# Patient Record
Sex: Female | Born: 1945 | Race: White | Hispanic: No | Marital: Married | Smoking: Former smoker
Health system: Southern US, Community
[De-identification: ages and names within clinical notes are randomized; demographics above are authoritative.]

## PROBLEM LIST (undated history)

## (undated) DIAGNOSIS — I10 Essential (primary) hypertension: Secondary | ICD-10-CM

## (undated) DIAGNOSIS — F419 Anxiety disorder, unspecified: Secondary | ICD-10-CM

## (undated) DIAGNOSIS — J449 Chronic obstructive pulmonary disease, unspecified: Secondary | ICD-10-CM

## (undated) DIAGNOSIS — F329 Major depressive disorder, single episode, unspecified: Secondary | ICD-10-CM

## (undated) DIAGNOSIS — M8580 Other specified disorders of bone density and structure, unspecified site: Secondary | ICD-10-CM

## (undated) DIAGNOSIS — F32A Depression, unspecified: Secondary | ICD-10-CM

## (undated) DIAGNOSIS — I5181 Takotsubo syndrome: Secondary | ICD-10-CM

## (undated) DIAGNOSIS — J45909 Unspecified asthma, uncomplicated: Secondary | ICD-10-CM

## (undated) DIAGNOSIS — E785 Hyperlipidemia, unspecified: Secondary | ICD-10-CM

## (undated) DIAGNOSIS — K5792 Diverticulitis of intestine, part unspecified, without perforation or abscess without bleeding: Secondary | ICD-10-CM

## (undated) DIAGNOSIS — K5790 Diverticulosis of intestine, part unspecified, without perforation or abscess without bleeding: Secondary | ICD-10-CM

## (undated) DIAGNOSIS — G4730 Sleep apnea, unspecified: Secondary | ICD-10-CM

## (undated) HISTORY — DX: Other specified disorders of bone density and structure, unspecified site: M85.80

## (undated) HISTORY — PX: BREAST BIOPSY: SHX20

## (undated) HISTORY — PX: ABDOMINAL HYSTERECTOMY: SHX81

## (undated) HISTORY — PX: NASAL SINUS SURGERY: SHX719

## (undated) HISTORY — DX: Anxiety disorder, unspecified: F41.9

## (undated) HISTORY — DX: Sleep apnea, unspecified: G47.30

## (undated) HISTORY — DX: Essential (primary) hypertension: I10

## (undated) HISTORY — DX: Takotsubo syndrome: I51.81

## (undated) HISTORY — DX: Major depressive disorder, single episode, unspecified: F32.9

## (undated) HISTORY — DX: Depression, unspecified: F32.A

## (undated) HISTORY — DX: Diverticulosis of intestine, part unspecified, without perforation or abscess without bleeding: K57.90

## (undated) HISTORY — DX: Hyperlipidemia, unspecified: E78.5

## (undated) HISTORY — DX: Diverticulitis of intestine, part unspecified, without perforation or abscess without bleeding: K57.92

## (undated) HISTORY — DX: Unspecified asthma, uncomplicated: J45.909

## (undated) HISTORY — DX: Chronic obstructive pulmonary disease, unspecified: J44.9

---

## 1997-06-03 ENCOUNTER — Encounter (INDEPENDENT_AMBULATORY_CARE_PROVIDER_SITE_OTHER): Payer: Self-pay | Admitting: Internal Medicine

## 1997-06-03 LAB — CONVERTED CEMR LAB: Pap Smear: NORMAL

## 1998-03-01 ENCOUNTER — Encounter: Admission: RE | Admit: 1998-03-01 | Discharge: 1998-03-01 | Payer: Self-pay | Admitting: Sports Medicine

## 1998-03-29 ENCOUNTER — Encounter: Admission: RE | Admit: 1998-03-29 | Discharge: 1998-03-29 | Payer: Self-pay | Admitting: Sports Medicine

## 1998-06-21 ENCOUNTER — Encounter: Admission: RE | Admit: 1998-06-21 | Discharge: 1998-06-21 | Payer: Self-pay | Admitting: Sports Medicine

## 1998-10-11 ENCOUNTER — Encounter: Admission: RE | Admit: 1998-10-11 | Discharge: 1998-10-11 | Payer: Self-pay | Admitting: Sports Medicine

## 1998-11-15 ENCOUNTER — Encounter: Admission: RE | Admit: 1998-11-15 | Discharge: 1998-11-15 | Payer: Self-pay | Admitting: Sports Medicine

## 1998-12-13 ENCOUNTER — Encounter: Admission: RE | Admit: 1998-12-13 | Discharge: 1998-12-13 | Payer: Self-pay | Admitting: Sports Medicine

## 1999-06-20 ENCOUNTER — Encounter: Admission: RE | Admit: 1999-06-20 | Discharge: 1999-06-20 | Payer: Self-pay | Admitting: Sports Medicine

## 1999-09-03 ENCOUNTER — Encounter: Admission: RE | Admit: 1999-09-03 | Discharge: 1999-09-03 | Payer: Self-pay | Admitting: Family Medicine

## 1999-09-03 ENCOUNTER — Encounter: Payer: Self-pay | Admitting: Family Medicine

## 1999-11-13 ENCOUNTER — Ambulatory Visit: Admission: RE | Admit: 1999-11-13 | Discharge: 1999-11-13 | Payer: Self-pay | Admitting: Family Medicine

## 1999-11-13 ENCOUNTER — Encounter: Payer: Self-pay | Admitting: Internal Medicine

## 1999-12-05 ENCOUNTER — Encounter (INDEPENDENT_AMBULATORY_CARE_PROVIDER_SITE_OTHER): Payer: Self-pay | Admitting: Internal Medicine

## 1999-12-25 ENCOUNTER — Other Ambulatory Visit: Admission: RE | Admit: 1999-12-25 | Discharge: 1999-12-25 | Payer: Self-pay | Admitting: Family Medicine

## 2000-02-12 ENCOUNTER — Encounter: Admission: RE | Admit: 2000-02-12 | Discharge: 2000-02-12 | Payer: Self-pay | Admitting: Family Medicine

## 2000-02-12 ENCOUNTER — Encounter: Payer: Self-pay | Admitting: Family Medicine

## 2000-07-13 ENCOUNTER — Other Ambulatory Visit: Admission: RE | Admit: 2000-07-13 | Discharge: 2000-07-13 | Payer: Self-pay | Admitting: *Deleted

## 2001-02-16 ENCOUNTER — Other Ambulatory Visit: Admission: RE | Admit: 2001-02-16 | Discharge: 2001-02-16 | Payer: Self-pay | Admitting: Internal Medicine

## 2001-02-16 ENCOUNTER — Encounter (INDEPENDENT_AMBULATORY_CARE_PROVIDER_SITE_OTHER): Payer: Self-pay

## 2003-04-04 ENCOUNTER — Encounter (INDEPENDENT_AMBULATORY_CARE_PROVIDER_SITE_OTHER): Payer: Self-pay | Admitting: Internal Medicine

## 2003-04-04 DIAGNOSIS — M949 Disorder of cartilage, unspecified: Secondary | ICD-10-CM

## 2003-04-04 DIAGNOSIS — E785 Hyperlipidemia, unspecified: Secondary | ICD-10-CM

## 2003-04-04 DIAGNOSIS — M899 Disorder of bone, unspecified: Secondary | ICD-10-CM | POA: Insufficient documentation

## 2003-04-04 LAB — CONVERTED CEMR LAB: Hgb A1c MFr Bld: 6 %

## 2004-01-06 ENCOUNTER — Other Ambulatory Visit: Payer: Self-pay

## 2004-09-20 ENCOUNTER — Ambulatory Visit: Payer: Self-pay | Admitting: Family Medicine

## 2004-09-23 ENCOUNTER — Ambulatory Visit: Payer: Self-pay | Admitting: Critical Care Medicine

## 2004-10-22 ENCOUNTER — Ambulatory Visit: Payer: Self-pay | Admitting: Family Medicine

## 2004-11-14 ENCOUNTER — Ambulatory Visit: Payer: Self-pay | Admitting: Family Medicine

## 2005-01-01 ENCOUNTER — Encounter (INDEPENDENT_AMBULATORY_CARE_PROVIDER_SITE_OTHER): Payer: Self-pay | Admitting: Internal Medicine

## 2005-01-24 ENCOUNTER — Encounter: Payer: Self-pay | Admitting: Obstetrics and Gynecology

## 2005-02-01 ENCOUNTER — Encounter: Payer: Self-pay | Admitting: Obstetrics and Gynecology

## 2005-03-21 ENCOUNTER — Ambulatory Visit: Payer: Self-pay | Admitting: Family Medicine

## 2005-03-26 ENCOUNTER — Ambulatory Visit: Payer: Self-pay | Admitting: Pulmonary Disease

## 2005-04-11 ENCOUNTER — Ambulatory Visit: Payer: Self-pay | Admitting: Licensed Clinical Social Worker

## 2005-04-18 ENCOUNTER — Ambulatory Visit: Payer: Self-pay | Admitting: Family Medicine

## 2005-04-18 ENCOUNTER — Ambulatory Visit: Payer: Self-pay | Admitting: Pulmonary Disease

## 2005-07-04 ENCOUNTER — Ambulatory Visit: Payer: Self-pay | Admitting: Family Medicine

## 2005-08-13 ENCOUNTER — Ambulatory Visit: Payer: Self-pay | Admitting: Family Medicine

## 2005-09-03 ENCOUNTER — Ambulatory Visit: Payer: Self-pay | Admitting: Family Medicine

## 2005-09-19 ENCOUNTER — Ambulatory Visit: Payer: Self-pay | Admitting: Family Medicine

## 2006-02-11 ENCOUNTER — Ambulatory Visit: Payer: Self-pay | Admitting: Family Medicine

## 2006-02-19 ENCOUNTER — Ambulatory Visit: Payer: Self-pay | Admitting: Pulmonary Disease

## 2006-05-12 ENCOUNTER — Ambulatory Visit: Payer: Self-pay | Admitting: Critical Care Medicine

## 2006-06-26 ENCOUNTER — Ambulatory Visit: Payer: Self-pay | Admitting: Critical Care Medicine

## 2006-08-07 ENCOUNTER — Ambulatory Visit: Payer: Self-pay | Admitting: Family Medicine

## 2006-10-08 ENCOUNTER — Ambulatory Visit: Payer: Self-pay | Admitting: Family Medicine

## 2006-12-08 ENCOUNTER — Ambulatory Visit: Payer: Self-pay | Admitting: Family Medicine

## 2007-01-11 ENCOUNTER — Ambulatory Visit: Payer: Self-pay | Admitting: Family Medicine

## 2007-01-12 ENCOUNTER — Ambulatory Visit: Payer: Self-pay | Admitting: Pulmonary Disease

## 2007-01-18 ENCOUNTER — Ambulatory Visit: Payer: Self-pay | Admitting: Family Medicine

## 2007-02-17 ENCOUNTER — Encounter: Payer: Self-pay | Admitting: Internal Medicine

## 2007-02-17 DIAGNOSIS — R519 Headache, unspecified: Secondary | ICD-10-CM | POA: Insufficient documentation

## 2007-02-17 DIAGNOSIS — G4733 Obstructive sleep apnea (adult) (pediatric): Secondary | ICD-10-CM | POA: Insufficient documentation

## 2007-02-17 DIAGNOSIS — F411 Generalized anxiety disorder: Secondary | ICD-10-CM

## 2007-02-17 DIAGNOSIS — M542 Cervicalgia: Secondary | ICD-10-CM | POA: Insufficient documentation

## 2007-02-17 DIAGNOSIS — K5909 Other constipation: Secondary | ICD-10-CM | POA: Insufficient documentation

## 2007-02-17 DIAGNOSIS — N6019 Diffuse cystic mastopathy of unspecified breast: Secondary | ICD-10-CM

## 2007-02-17 DIAGNOSIS — K5730 Diverticulosis of large intestine without perforation or abscess without bleeding: Secondary | ICD-10-CM | POA: Insufficient documentation

## 2007-02-17 DIAGNOSIS — R51 Headache: Secondary | ICD-10-CM

## 2007-03-17 ENCOUNTER — Ambulatory Visit: Payer: Self-pay | Admitting: Critical Care Medicine

## 2007-04-14 ENCOUNTER — Encounter (INDEPENDENT_AMBULATORY_CARE_PROVIDER_SITE_OTHER): Payer: Self-pay | Admitting: *Deleted

## 2007-04-16 ENCOUNTER — Ambulatory Visit: Payer: Self-pay | Admitting: Family Medicine

## 2007-04-16 DIAGNOSIS — J209 Acute bronchitis, unspecified: Secondary | ICD-10-CM

## 2007-04-21 ENCOUNTER — Telehealth (INDEPENDENT_AMBULATORY_CARE_PROVIDER_SITE_OTHER): Payer: Self-pay | Admitting: Internal Medicine

## 2007-06-29 ENCOUNTER — Telehealth (INDEPENDENT_AMBULATORY_CARE_PROVIDER_SITE_OTHER): Payer: Self-pay | Admitting: *Deleted

## 2007-07-02 ENCOUNTER — Ambulatory Visit: Payer: Self-pay | Admitting: Family Medicine

## 2007-07-09 ENCOUNTER — Encounter: Admission: RE | Admit: 2007-07-09 | Discharge: 2007-07-09 | Payer: Self-pay | Admitting: Family Medicine

## 2007-07-13 ENCOUNTER — Encounter (INDEPENDENT_AMBULATORY_CARE_PROVIDER_SITE_OTHER): Payer: Self-pay | Admitting: *Deleted

## 2007-08-02 ENCOUNTER — Ambulatory Visit: Payer: Self-pay | Admitting: Internal Medicine

## 2007-08-02 ENCOUNTER — Encounter: Payer: Self-pay | Admitting: Family Medicine

## 2007-08-02 LAB — CONVERTED CEMR LAB: Blood Glucose, Fingerstick: 108

## 2007-08-03 ENCOUNTER — Telehealth (INDEPENDENT_AMBULATORY_CARE_PROVIDER_SITE_OTHER): Payer: Self-pay | Admitting: Internal Medicine

## 2007-08-03 LAB — CONVERTED CEMR LAB
Basophils Relative: 0.5 % (ref 0.0–1.0)
CO2: 28 meq/L (ref 19–32)
Creatinine, Ser: 0.7 mg/dL (ref 0.4–1.2)
Eosinophils Relative: 1.4 % (ref 0.0–5.0)
Glucose, Bld: 84 mg/dL (ref 70–99)
HCT: 38.7 % (ref 36.0–46.0)
Hemoglobin: 13.4 g/dL (ref 12.0–15.0)
Lymphocytes Relative: 27.8 % (ref 12.0–46.0)
Monocytes Absolute: 0.5 10*3/uL (ref 0.2–0.7)
Neutro Abs: 4.2 10*3/uL (ref 1.4–7.7)
Neutrophils Relative %: 63.4 % (ref 43.0–77.0)
Potassium: 3.8 meq/L (ref 3.5–5.1)
RDW: 13 % (ref 11.5–14.6)
Sodium: 143 meq/L (ref 135–145)

## 2007-08-04 ENCOUNTER — Telehealth (INDEPENDENT_AMBULATORY_CARE_PROVIDER_SITE_OTHER): Payer: Self-pay | Admitting: Internal Medicine

## 2007-09-06 ENCOUNTER — Telehealth (INDEPENDENT_AMBULATORY_CARE_PROVIDER_SITE_OTHER): Payer: Self-pay | Admitting: Internal Medicine

## 2007-09-08 ENCOUNTER — Ambulatory Visit: Payer: Self-pay | Admitting: Family Medicine

## 2007-09-08 LAB — CONVERTED CEMR LAB
Glucose, Urine, Semiquant: NEGATIVE
Nitrite: POSITIVE
Specific Gravity, Urine: 1.015

## 2007-09-09 ENCOUNTER — Encounter: Payer: Self-pay | Admitting: Family Medicine

## 2007-09-27 ENCOUNTER — Encounter (INDEPENDENT_AMBULATORY_CARE_PROVIDER_SITE_OTHER): Payer: Self-pay | Admitting: Internal Medicine

## 2007-09-27 ENCOUNTER — Ambulatory Visit: Payer: Self-pay | Admitting: Internal Medicine

## 2007-09-27 DIAGNOSIS — R7309 Other abnormal glucose: Secondary | ICD-10-CM | POA: Insufficient documentation

## 2007-09-27 DIAGNOSIS — I959 Hypotension, unspecified: Secondary | ICD-10-CM

## 2007-09-27 LAB — CONVERTED CEMR LAB: Glucose, Bld: 138 mg/dL

## 2007-09-28 ENCOUNTER — Telehealth: Payer: Self-pay | Admitting: Family Medicine

## 2007-10-05 ENCOUNTER — Telehealth (INDEPENDENT_AMBULATORY_CARE_PROVIDER_SITE_OTHER): Payer: Self-pay | Admitting: *Deleted

## 2007-10-07 ENCOUNTER — Encounter (INDEPENDENT_AMBULATORY_CARE_PROVIDER_SITE_OTHER): Payer: Self-pay | Admitting: Internal Medicine

## 2007-10-08 ENCOUNTER — Ambulatory Visit: Payer: Self-pay | Admitting: Family Medicine

## 2007-10-08 ENCOUNTER — Telehealth (INDEPENDENT_AMBULATORY_CARE_PROVIDER_SITE_OTHER): Payer: Self-pay | Admitting: Internal Medicine

## 2007-10-08 ENCOUNTER — Encounter (INDEPENDENT_AMBULATORY_CARE_PROVIDER_SITE_OTHER): Payer: Self-pay | Admitting: Internal Medicine

## 2007-10-11 ENCOUNTER — Telehealth (INDEPENDENT_AMBULATORY_CARE_PROVIDER_SITE_OTHER): Payer: Self-pay | Admitting: Internal Medicine

## 2007-10-15 ENCOUNTER — Ambulatory Visit: Payer: Self-pay | Admitting: Critical Care Medicine

## 2007-10-15 ENCOUNTER — Ambulatory Visit: Payer: Self-pay | Admitting: Family Medicine

## 2007-10-15 DIAGNOSIS — E119 Type 2 diabetes mellitus without complications: Secondary | ICD-10-CM

## 2007-10-18 ENCOUNTER — Telehealth (INDEPENDENT_AMBULATORY_CARE_PROVIDER_SITE_OTHER): Payer: Self-pay | Admitting: Internal Medicine

## 2007-10-19 ENCOUNTER — Ambulatory Visit: Payer: Self-pay | Admitting: Obstetrics and Gynecology

## 2007-11-03 ENCOUNTER — Encounter (INDEPENDENT_AMBULATORY_CARE_PROVIDER_SITE_OTHER): Payer: Self-pay | Admitting: Internal Medicine

## 2007-11-19 ENCOUNTER — Encounter (INDEPENDENT_AMBULATORY_CARE_PROVIDER_SITE_OTHER): Payer: Self-pay | Admitting: Internal Medicine

## 2007-12-10 ENCOUNTER — Ambulatory Visit: Payer: Self-pay | Admitting: Endocrinology

## 2007-12-24 ENCOUNTER — Ambulatory Visit: Payer: Self-pay | Admitting: Critical Care Medicine

## 2007-12-24 ENCOUNTER — Telehealth (INDEPENDENT_AMBULATORY_CARE_PROVIDER_SITE_OTHER): Payer: Self-pay | Admitting: Internal Medicine

## 2007-12-24 DIAGNOSIS — J441 Chronic obstructive pulmonary disease with (acute) exacerbation: Secondary | ICD-10-CM

## 2007-12-28 ENCOUNTER — Ambulatory Visit: Payer: Self-pay | Admitting: Critical Care Medicine

## 2007-12-28 ENCOUNTER — Encounter (INDEPENDENT_AMBULATORY_CARE_PROVIDER_SITE_OTHER): Payer: Self-pay | Admitting: Internal Medicine

## 2007-12-28 ENCOUNTER — Encounter: Payer: Self-pay | Admitting: Family Medicine

## 2007-12-28 ENCOUNTER — Inpatient Hospital Stay (HOSPITAL_COMMUNITY): Admission: AD | Admit: 2007-12-28 | Discharge: 2007-12-31 | Payer: Self-pay | Admitting: Critical Care Medicine

## 2007-12-30 ENCOUNTER — Encounter (INDEPENDENT_AMBULATORY_CARE_PROVIDER_SITE_OTHER): Payer: Self-pay | Admitting: Internal Medicine

## 2007-12-30 DIAGNOSIS — N766 Ulceration of vulva: Secondary | ICD-10-CM | POA: Insufficient documentation

## 2007-12-30 DIAGNOSIS — F17200 Nicotine dependence, unspecified, uncomplicated: Secondary | ICD-10-CM | POA: Insufficient documentation

## 2007-12-30 DIAGNOSIS — N952 Postmenopausal atrophic vaginitis: Secondary | ICD-10-CM

## 2007-12-30 DIAGNOSIS — N3946 Mixed incontinence: Secondary | ICD-10-CM

## 2007-12-31 ENCOUNTER — Encounter (INDEPENDENT_AMBULATORY_CARE_PROVIDER_SITE_OTHER): Payer: Self-pay | Admitting: Internal Medicine

## 2008-01-07 ENCOUNTER — Ambulatory Visit: Payer: Self-pay | Admitting: Pulmonary Disease

## 2008-01-12 ENCOUNTER — Telehealth (INDEPENDENT_AMBULATORY_CARE_PROVIDER_SITE_OTHER): Payer: Self-pay | Admitting: *Deleted

## 2008-01-12 ENCOUNTER — Ambulatory Visit: Payer: Self-pay | Admitting: Family Medicine

## 2008-01-12 DIAGNOSIS — J449 Chronic obstructive pulmonary disease, unspecified: Secondary | ICD-10-CM

## 2008-01-12 DIAGNOSIS — J4489 Other specified chronic obstructive pulmonary disease: Secondary | ICD-10-CM | POA: Insufficient documentation

## 2008-01-12 DIAGNOSIS — I10 Essential (primary) hypertension: Secondary | ICD-10-CM | POA: Insufficient documentation

## 2008-01-13 ENCOUNTER — Encounter: Payer: Self-pay | Admitting: Critical Care Medicine

## 2008-01-18 ENCOUNTER — Telehealth: Payer: Self-pay | Admitting: Critical Care Medicine

## 2008-01-18 ENCOUNTER — Ambulatory Visit: Payer: Self-pay | Admitting: Critical Care Medicine

## 2008-01-20 ENCOUNTER — Encounter: Payer: Self-pay | Admitting: Critical Care Medicine

## 2008-01-24 ENCOUNTER — Encounter: Payer: Self-pay | Admitting: Critical Care Medicine

## 2008-01-24 ENCOUNTER — Telehealth (INDEPENDENT_AMBULATORY_CARE_PROVIDER_SITE_OTHER): Payer: Self-pay | Admitting: *Deleted

## 2008-02-04 ENCOUNTER — Telehealth: Payer: Self-pay | Admitting: Critical Care Medicine

## 2008-02-08 ENCOUNTER — Telehealth: Payer: Self-pay | Admitting: Critical Care Medicine

## 2008-02-10 ENCOUNTER — Encounter: Payer: Self-pay | Admitting: Critical Care Medicine

## 2008-02-16 ENCOUNTER — Ambulatory Visit: Payer: Self-pay | Admitting: Family Medicine

## 2008-02-25 ENCOUNTER — Encounter (INDEPENDENT_AMBULATORY_CARE_PROVIDER_SITE_OTHER): Payer: Self-pay | Admitting: Internal Medicine

## 2008-02-28 ENCOUNTER — Telehealth: Payer: Self-pay | Admitting: Family Medicine

## 2008-03-22 ENCOUNTER — Ambulatory Visit: Payer: Self-pay | Admitting: Family Medicine

## 2008-09-15 ENCOUNTER — Ambulatory Visit: Payer: Self-pay | Admitting: Family Medicine

## 2008-09-15 DIAGNOSIS — F4321 Adjustment disorder with depressed mood: Secondary | ICD-10-CM | POA: Insufficient documentation

## 2008-09-15 DIAGNOSIS — R0602 Shortness of breath: Secondary | ICD-10-CM | POA: Insufficient documentation

## 2008-09-25 ENCOUNTER — Telehealth: Payer: Self-pay | Admitting: Family Medicine

## 2008-09-26 ENCOUNTER — Ambulatory Visit: Payer: Self-pay | Admitting: Family Medicine

## 2008-09-27 ENCOUNTER — Encounter: Payer: Self-pay | Admitting: Family Medicine

## 2008-11-01 ENCOUNTER — Telehealth: Payer: Self-pay | Admitting: Family Medicine

## 2008-11-20 ENCOUNTER — Telehealth (INDEPENDENT_AMBULATORY_CARE_PROVIDER_SITE_OTHER): Payer: Self-pay | Admitting: *Deleted

## 2008-11-20 ENCOUNTER — Encounter: Payer: Self-pay | Admitting: Critical Care Medicine

## 2009-01-09 ENCOUNTER — Ambulatory Visit: Payer: Self-pay | Admitting: Urology

## 2009-02-09 ENCOUNTER — Ambulatory Visit: Payer: Self-pay | Admitting: Critical Care Medicine

## 2009-02-12 ENCOUNTER — Telehealth (INDEPENDENT_AMBULATORY_CARE_PROVIDER_SITE_OTHER): Payer: Self-pay | Admitting: *Deleted

## 2009-02-12 ENCOUNTER — Encounter: Payer: Self-pay | Admitting: Family Medicine

## 2009-02-19 ENCOUNTER — Encounter: Payer: Self-pay | Admitting: Family Medicine

## 2009-02-19 ENCOUNTER — Ambulatory Visit: Payer: Self-pay | Admitting: Gastroenterology

## 2009-03-26 ENCOUNTER — Ambulatory Visit: Payer: Self-pay | Admitting: Family Medicine

## 2009-03-26 DIAGNOSIS — J309 Allergic rhinitis, unspecified: Secondary | ICD-10-CM | POA: Insufficient documentation

## 2009-03-27 ENCOUNTER — Telehealth: Payer: Self-pay | Admitting: Family Medicine

## 2009-04-12 ENCOUNTER — Telehealth: Payer: Self-pay | Admitting: Family Medicine

## 2009-06-22 ENCOUNTER — Ambulatory Visit: Payer: Self-pay | Admitting: Family Medicine

## 2009-06-29 ENCOUNTER — Encounter: Payer: Self-pay | Admitting: Critical Care Medicine

## 2009-06-29 ENCOUNTER — Ambulatory Visit: Payer: Self-pay | Admitting: Critical Care Medicine

## 2009-07-05 ENCOUNTER — Telehealth: Payer: Self-pay | Admitting: Family Medicine

## 2009-07-05 ENCOUNTER — Ambulatory Visit: Payer: Self-pay | Admitting: Critical Care Medicine

## 2009-07-05 ENCOUNTER — Encounter: Payer: Self-pay | Admitting: Adult Health

## 2009-10-01 ENCOUNTER — Ambulatory Visit: Payer: Self-pay | Admitting: Family Medicine

## 2009-10-15 ENCOUNTER — Telehealth: Payer: Self-pay | Admitting: Family Medicine

## 2009-10-22 ENCOUNTER — Telehealth: Payer: Self-pay | Admitting: Family Medicine

## 2009-11-06 ENCOUNTER — Telehealth: Payer: Self-pay | Admitting: Family Medicine

## 2009-11-07 ENCOUNTER — Ambulatory Visit: Payer: Self-pay | Admitting: Family Medicine

## 2009-11-07 DIAGNOSIS — M25559 Pain in unspecified hip: Secondary | ICD-10-CM | POA: Insufficient documentation

## 2009-11-07 LAB — CONVERTED CEMR LAB
Glucose, Urine, Semiquant: NEGATIVE
Ketones, urine, test strip: NEGATIVE
Nitrite: NEGATIVE
Urobilinogen, UA: 0.2
pH: 6

## 2009-11-12 ENCOUNTER — Encounter (INDEPENDENT_AMBULATORY_CARE_PROVIDER_SITE_OTHER): Payer: Self-pay | Admitting: Internal Medicine

## 2009-11-23 ENCOUNTER — Ambulatory Visit: Payer: Self-pay | Admitting: Family Medicine

## 2009-12-21 ENCOUNTER — Encounter: Payer: Self-pay | Admitting: Critical Care Medicine

## 2009-12-24 ENCOUNTER — Telehealth: Payer: Self-pay | Admitting: Critical Care Medicine

## 2009-12-26 ENCOUNTER — Telehealth (INDEPENDENT_AMBULATORY_CARE_PROVIDER_SITE_OTHER): Payer: Self-pay | Admitting: *Deleted

## 2010-01-25 ENCOUNTER — Telehealth: Payer: Self-pay | Admitting: Family Medicine

## 2010-02-06 ENCOUNTER — Ambulatory Visit: Payer: Self-pay | Admitting: Family Medicine

## 2010-03-07 ENCOUNTER — Encounter: Payer: Self-pay | Admitting: Family Medicine

## 2010-03-25 ENCOUNTER — Encounter: Payer: Self-pay | Admitting: Critical Care Medicine

## 2010-06-11 ENCOUNTER — Encounter (INDEPENDENT_AMBULATORY_CARE_PROVIDER_SITE_OTHER): Payer: Self-pay | Admitting: *Deleted

## 2010-12-05 NOTE — Letter (Signed)
Summary: CMN for CPAP Supplies/[facility]  CMN for CPAP Supplies/[facility]   Imported By: Sherian Rein 12/25/2009 14:07:48  _____________________________________________________________________  External Attachment:    Type:   Image     Comment:   External Document

## 2010-12-05 NOTE — Progress Notes (Signed)
Summary: UTI?  Phone Note Call from Patient Call back at Work Phone [phone]   Caller: Patient Call For: Shaune Leeks MD Summary of Call: pt thinks she may have a UTI and would like to drop off a urine sample to be checked. I told pt she would need to be seen and pt states that you don't examine her so why would she need to be seen? I told her we don't just dip urines when pt drop them off unless directed by the physican. Please advise. Initial call taken by: Mervin Hack CMA Duncan Dull),  November 06, 2009 11:47 AM  Follow-up for Phone Call        She can drop urine off this one time but I want to talk to her when she does so. Please have her see Rene Kocher when she drops off her urine. Follow-up by: Shaune Leeks MD,  November 06, 2009 1:21 PM  Additional Follow-up for Phone Call Additional follow up Details #1::        Patient notified as instructed by telephone. Patient to come by and give a urine on Thursday at 8:30 AM. Offered patient several times and options, but patient was unable to come by prior to Thursday because of her work schedule. Per patient she works from 8:30 AM to 7:00 PM. Additional Follow-up by: Sydell Axon LPN,  November 06, 2009 2:53 PM

## 2010-12-05 NOTE — Miscellaneous (Signed)
Summary: [facility] Order for Supplies  [facility] Order for Supplies   Imported By: Beau Fanny 03/08/2010 09:47:55  _____________________________________________________________________  External Attachment:    Type:   Image     Comment:   External Document

## 2010-12-05 NOTE — Progress Notes (Signed)
Summary: requesting cpap  Phone Note Other Incoming   Caller: Patient Caller: Tillman Sers from [place] - fax [phone] Summary of Call: Patient is requesting CPAP, Sandrea from [place] called and wants all prior sleep study notes faxed to [phone]. Chart is in your inbox. Initial call taken by: Melody Comas,  December 26, 2009 1:33 PM  Follow-up for Phone Call        Chart shows only ltr from Dr Delford Field referencing sleep studies done by our Pul;monary Dept. Please cpy that ltr and fax to them. We have no copies of sleep studies. Follow-up by: Shaune Leeks MD,  December 26, 2009 1:47 PM  Additional Follow-up for Phone Call Additional follow up Details #1::        Faxed pulmonary office note as instructed.  Additional Follow-up by: Melody Comas,  December 26, 2009 2:02 PM

## 2010-12-05 NOTE — Assessment & Plan Note (Signed)
Summary: ST,HA,BODY ACHES/CLE   Vital Signs:  Patient profile:   65 year old female Weight:      154.75 pounds Temp:     97.6 degrees F oral Pulse rate:   84 / minute Pulse rhythm:   regular BP sitting:   110 / 70  (left arm) Cuff size:   regular  Vitals Entered By: Sydell Axon LPN (February 06, 2830 9:07 AM) CC: Cough, sore throat, body aches and headache   History of Present Illness: Pt thinks she has allergies today. She has been on Claritin , then Allegra and most recently on Zyrtec. Zyrtec seemed to help. Now she is taking Loratidine without D and all her friends take D. (I tried to explain the risk of Sudafed on BP, kidneys and palpitations.) She has pain in her shoulder and neck and feels swelling in the left side of the lateral neck.  She is eating ok, denies fever or chills, does have itchy eyes and runny nose with sneezing.  Problems Prior to Update: 1)  Pain in Joint Pelvic Region and Thigh  (ICD-719.45) 2)  Allergic Rhinitis  (ICD-477.9) 3)  Grief Reaction  (ICD-309.0) 4)  Shortness of Breath  (ICD-786.05) 5)  Allergy, Environmental/seasonal  (ICD-995.3) 6)  Chronic Obstructive Pulmonary Disease, Oxygen Dependent  (ICD-496) 7)  Hypertension  (ICD-401.9) 8)  Unspecified Ulceration of Vulva  (ICD-616.50) 9)  Tobacco Abuse  (ICD-305.1) 10)  Mixed Incontinence Urge and Stress  (ICD-788.33) 11)  Vaginitis, Atrophic  (ICD-627.3) 12)  Chronic Obstructive Pulmonary Disease, Acute Exacerbation  (ICD-491.21) 13)  Diabetes-type 2  (ICD-250.00) 14)  Hyperglycemia  (ICD-790.29) 15)  Hypotension  (ICD-458.9) 16)  Bronchitis, Acute With Bronchospasm  (ICD-466.0) 17)  Constipation, Chronic  (ICD-564.09) 18)  Sleep Apnea  (ICD-780.57) 19)  Facial Pain  (ICD-784.0) 20)  Fibrocystic Breast Disease  (ICD-610.1) 21)  Neck Pain  (ICD-723.1) 22)  Anxiety  (ICD-300.00) 23)  Osteopenia  (ICD-733.90) 24)  Hyperlipidemia  (ICD-272.4) 25)  Diverticulosis, Colon   (ICD-562.10)  Medications Prior to Update: 1)  Advair Diskus 250-50 Mcg/dose Misc (Fluticasone-Salmeterol) .... Use One Puff Two Times A Day 2)  Xanax 0.5 Mg Tabs (Alprazolam) .... Take 1 Tablet By Mouth Three Times A Day As Needed 3)  Ventolin Hfa 108 (90 Base) Mcg/act  Aers (Albuterol Sulfate) .Marland Kitchen.. 1-2 Puffs Every 4-6 Hours As Needed 4)  Spiriva Handihaler 18 Mcg  Caps (Tiotropium Bromide Monohydrate) .... Inhale Contents of 1 Capsule Once A Day 5)  Cvs Saline Nasal Spray 0.65 %  Soln (Saline) .Marland Kitchen.. 1-2 Puffs Each Nostril 3-4x Day 6)  Flonase 50 Mcg/act  Susp (Fluticasone Propionate) .... Two Puffs Each Nostril Daily 7)  Oxygen .... 2l Continuous 8)  Metformin Hcl 500 Mg  Tabs (Metformin Hcl) .... Take 1 Tablet By Mouth Two Times A Day 9)  Zyrtec Allergy 10 Mg  Tabs (Cetirizine Hcl) .Marland Kitchen.. 1 At Bedtime For Allergies 10)  Cpap .Marland Kitchen.. 10cmh20 With 2l 11)  Sertraline Hcl 100 Mg Tabs (Sertraline Hcl) .... One Tab By Mouth Once Daily 12)  Cozaar 100 Mg Tabs (Losartan Potassium) .... One Tab By Mouth Once Daily 13)  Cvs Aspirin 325 Mg Tabs (Aspirin) .... Take 1 Tablet By Mouth Once A Day 14)  Tessalon 200 Mg Caps (Benzonatate) .Marland Kitchen.. 1 By Mouth Three Times A Day As Needed Cough 15)  Promethazine Vc/codeine 6.25-5-10 Mg/101ml Syrp (Phenyleph-Promethazine-Cod) .Marland Kitchen.. 1-2 Tsp Every 4-6 Hr As Needed Cough 16)  Bactrim Ds 800-160 Mg Tabs (Sulfamethoxazole-Trimethoprim) .... One  Tab By Mouth Two Times A Day 17)  Macrobid 100 Mg Caps (Nitrofurantoin Monohyd Macro) .Marland Kitchen.. 1 By Mouth Two Times A Day For 7 Days 18)  Macrobid 100 Mg Caps (Nitrofurantoin Monohyd Macro) .... Take 1 Two Times A Day For Uti  Allergies: 1)  ! Augmentin (Amoxicillin-Pot Clavulanate) 2)  Erythromycin Base (Erythromycin Base)  Physical Exam  General:  Well-developed,well-nourished,in no acute distress; alert,appropriate and cooperative throughout examination, congested but nml RR. Facially slightly congested. Head:  Normocephalic and  atraumatic without obvious abnormalities. No apparent alopecia or balding. Sinuses NT. Eyes:  Conjunctiva inflamed dependently  bilaterally. Mild "alllergic shiners" bilat. Ears:  External ear exam shows no significant lesions or deformities.  Otoscopic examination reveals clear canals, tympanic membranes are intact bilaterally without bulging, retraction, inflammation or discharge. Hearing is grossly normal bilaterally. Signif cerumen bilat. Nose:  Mild congestion with clear mucous. Right nare swollen. Mouth:  pharynx pink and moist, no erythema, and no exudates.  No PND. Neck:  No deformities, masses, or tenderness noted. Mild anterior cervical adenopathy on left, shoddy and mobile. Chest Wall:  No deformities, masses, or tenderness noted. Lungs:  Normal respiratory effort, chest expands symmetrically. Lungs are clear to auscultation, no crackles or wheezes. Heart:  Normal rate and regular rhythm. S1 and S2 normal without gallop, murmur, click, rub or other extra sounds.   Impression & Recommendations:  Problem # 1:  ALLERGIC RHINITIS (ICD-477.9) Assessment Deteriorated  Acutely activated.  Start Zyrtec regularly for the season. Start Nasonex as demonstrated. Use Neti pot.  The following medications were removed from the medication list:    Zyrtec Allergy 10 Mg Tabs (Cetirizine hcl) .Marland Kitchen... 1 at bedtime for allergies Her updated medication list for this problem includes:    Cvs Saline Nasal Spray 0.65 % Soln (Saline) .Marland Kitchen... 1-2 puffs each nostril 3-4x day    Nasonex 50 Mcg/act Susp (Mometasone furoate) .Marland Kitchen... 2 puffs each nostril two times a day as needed rhinitis    Loratadine 10 Mg Tabs (Loratadine) .Marland Kitchen... Take one by mouth daily  Discussed use of allergy medications and environmental measures.   Problem # 2:  HYPERTENSION (ICD-401.9) Assessment: Improved Cont/back on Diovan .Marland KitchenMarland KitchenCozaar poorly tolerated.  The following medications were removed from the medication list:    Cozaar 100 Mg  Tabs (Losartan potassium) ..... One tab by mouth once daily Her updated medication list for this problem includes:    Diovan 160 Mg Tabs (Valsartan) .Marland Kitchen... Take one by mouth daily  BP today: 110/70 Prior BP: 120/86 (11/07/2009)  Labs Reviewed: K+: 3.8 (08/02/2007) Creat: : 0.7 (08/02/2007)     Problem # 3:  URI (ICD-465.9) Assessment: New  Use meds below as discussed. Push fluids. The following medications were removed from the medication list:    Zyrtec Allergy 10 Mg Tabs (Cetirizine hcl) .Marland Kitchen... 1 at bedtime for allergies Her updated medication list for this problem includes:    Cvs Aspirin 325 Mg Tabs (Aspirin) .Marland Kitchen... Take 1 tablet by mouth once a day    Tessalon 200 Mg Caps (Benzonatate) .Marland Kitchen... 1 by mouth three times a day as needed cough    Promethazine Vc/codeine 6.25-5-10 Mg/14ml Syrp (Phenyleph-promethazine-cod) .Marland Kitchen... 1-2 tsp every 4-6 hr as needed cough    Loratadine 10 Mg Tabs (Loratadine) .Marland Kitchen... Take one by mouth daily  Instructed on symptomatic treatment. Call if symptoms persist or worsen.   Problem # 4:  CERVICAL LYMPHADENOPATHY, L ANT (ICD-785.6) Assessment: New  Reassured. Gargle and give time to clear. The following medications were  removed from the medication list:    Bactrim Ds 800-160 Mg Tabs (Sulfamethoxazole-trimethoprim) ..... One tab by mouth two times a day  Warm, moist compresses and symptomatic measures.  Problem # 5:  ANXIETY (ICD-300.00) Assessment: Unchanged Stable, want refill of her Xanax. Discussed taking sparingly. Her updated medication list for this problem includes:    Xanax 0.5 Mg Tabs (Alprazolam) .Marland Kitchen... Take 1 tablet by mouth three times a day as needed    Sertraline Hcl 100 Mg Tabs (Sertraline hcl) ..... One tab by mouth once daily  Discussed medication use.   Complete Medication List: 1)  Advair Diskus 250-50 Mcg/dose Misc (Fluticasone-salmeterol) .... Use one puff two times a day 2)  Xanax 0.5 Mg Tabs (Alprazolam) .... Take 1 tablet by  mouth three times a day as needed 3)  Ventolin Hfa 108 (90 Base) Mcg/act Aers (Albuterol sulfate) .Marland Kitchen.. 1-2 puffs every 4-6 hours as needed 4)  Spiriva Handihaler 18 Mcg Caps (Tiotropium bromide monohydrate) .... Inhale contents of 1 capsule once a day 5)  Cvs Saline Nasal Spray 0.65 % Soln (Saline) .Marland Kitchen.. 1-2 puffs each nostril 3-4x day 6)  Nasonex 50 Mcg/act Susp (Mometasone furoate) .... 2 puffs each nostril two times a day as needed rhinitis 7)  Oxygen  .... 2l continuous 8)  Metformin Hcl 500 Mg Tabs (Metformin hcl) .... Take 1 tablet by mouth two times a day 9)  Cpap  .Marland Kitchen.. 10cmh20 with 2l 10)  Sertraline Hcl 100 Mg Tabs (Sertraline hcl) .... One tab by mouth once daily 11)  Cvs Aspirin 325 Mg Tabs (Aspirin) .... Take 1 tablet by mouth once a day 12)  Tessalon 200 Mg Caps (Benzonatate) .Marland Kitchen.. 1 by mouth three times a day as needed cough 13)  Promethazine Vc/codeine 6.25-5-10 Mg/67ml Syrp (Phenyleph-promethazine-cod) .Marland Kitchen.. 1-2 tsp every 4-6 hr as needed cough 14)  Diovan 160 Mg Tabs (Valsartan) .... Take one by mouth daily 15)  Loratadine 10 Mg Tabs (Loratadine) .... Take one by mouth daily  Patient Instructions: 1)  Take Tyl ES 2 tabs by mouth three times a day  2)  Use allergy meds as discussed. 3)  Trial of Nasonex, given script 4)  Call if sxs don't improve. Prescriptions: NASONEX 50 MCG/ACT SUSP (MOMETASONE FUROATE) 2 puffs each nostril two times a day as needed rhinitis  #1 MDI x 12   Entered and Authorized by:   Shaune Leeks MD   Signed by:   Shaune Leeks MD on 02/06/2010   Method used:   [facility]        [facility]  [place] (retail)       [address]       [place], [place]  [address]       Ph: [phone] or [phone]       Fax: [phone]   RxID:   [id number] DIOVAN 160 MG TABS (VALSARTAN) Take one by mouth daily  #30 x 12   Entered and Authorized by:   Shaune Leeks MD   Signed by:   Shaune Leeks MD on 02/06/2010    Method used:   [facility]        [facility]  [place] [facility]retail)       [address]       [place], [place]  [address]       Ph: [phone] or [phone]       Fax: [phone]   RxID:   [id number] XANAX 0.5 MG TABS (ALPRAZOLAM) Take 1  tablet by mouth three times a day as needed  #60 x 5   Entered and Authorized by:   Shaune Leeks MD   Signed by:   Shaune Leeks MD on 02/06/2010   Method used:   Print then Give to Patient   RxID:   [id number]   Current Allergies (reviewed today): ! AUGMENTIN (AMOXICILLIN-POT CLAVULANATE) ERYTHROMYCIN BASE (ERYTHROMYCIN BASE)

## 2010-12-05 NOTE — Assessment & Plan Note (Signed)
Summary: URINE/Levie Owensby/CLE   Nurse Visit   Allergies: 1)  ! Augmentin (Amoxicillin-Pot Clavulanate) 2)  Erythromycin Base (Erythromycin Base) Laboratory Results   Urine Tests   Date/Time Reported: November 23, 2009 9:06 AM     Comments: Patient came in to leave urine sample for repeat UA.  Urine sent for culture.  Linde Gillis CMA Duncan Dull)  November 23, 2009 9:07 AM     Appended Document: URINE/Hoy Fallert/CLE

## 2010-12-05 NOTE — Miscellaneous (Signed)
Summary: Orders Update  Medications Added MACROBID 100 MG CAPS (NITROFURANTOIN MONOHYD MACRO) take 1 two times a day for UTI       Clinical Lists Changes  Medications: Added new medication of MACROBID 100 MG CAPS (NITROFURANTOIN MONOHYD MACRO) take 1 two times a day for UTI - Signed Rx of MACROBID 100 MG CAPS (NITROFURANTOIN MONOHYD MACRO) take 1 two times a day for UTI;  #21 x 0;  Signed;  Entered by: Gildardo Griffes FNP;  Authorized by: Gildardo Griffes FNP;  Method used: [facility] [phone]*, [address] [place], [place], [place]  [address], Ph: [phone] or [phone], Fax: [phone]    Prescriptions: MACROBID 100 MG CAPS (NITROFURANTOIN MONOHYD MACRO) take 1 two times a day for UTI  #21 x 0   Entered and Authorized by:   Gildardo Griffes FNP   Signed by:   Gildardo Griffes FNP on 11/12/2009   Method used:   Electronically to        [facility] [phone]* (retail)       [address] [place], [place]  [address]       Ph: [phone] or [phone]       Fax: [phone]   RxID:   [id number]  Patient notified by telephone.

## 2010-12-05 NOTE — Progress Notes (Signed)
Summary: Needs OV - get sleep study results prior to appt  Phone Note Outgoing Call   Reason for Call: Confirm/change Appt Summary of Call: I need to see this pt to go over her [facility] cpap request with her.  pull all prior sleep studies prior to OV .  see form on your desk   Initial call taken by: Storm Frisk MD,  December 24, 2009 9:54 AM  Follow-up for Phone Call        called pt's home number-[id number] Gweneth Dimitri RN  December 25, 2009 9:43 AM called pt's cell number- [facility] Gweneth Dimitri RN  December 25, 2009 9:44 AM   Additional Follow-up for Phone Call Additional follow up Details #1::        called, spoke with pt.  Pt informed of above statement per PW.  ov scheduled for January 11, 2010 at 10:00-pt aware of appt date/time.  Pt states she had a sleep study approx 7-8 years ago at [facility].  Will call to get sleep study results.   Additional Follow-up by: Gweneth Dimitri RN,  December 25, 2009 3:30 PM    Additional Follow-up for Phone Call Additional follow up Details #2::    called [facility].  Per [facility], pt had a sleep study on Nov 13, 1999 and referring physician was Dr.  Gershon Crane.  states they do not keep records past 2002 so they no longer have the results.  Will attempt to call Dr. Claris Che office for results.  Gweneth Dimitri RN  December 25, 2009 3:40 PM  Called Dr. Claris Che office.  Spoke with Bjorn Loser.  Per Bjorn Loser, she will check pt's chart for all prior sleep studies and will call me back once she has done this.  Shearon Balo triage fax number to fax over all sleep studies.  Gweneth Dimitri RN  December 25, 2009 4:55 PM  have not heard anything back form Dr. Claris Che office.  Called again regarding sleep studies.  Spoke with [place].  Per Tim Lair, pt is a pt at [facility] ? maybe pt saw Dr. Clent Ridges before he left [facility].  She is calling [facility] to see if pt's chart is there and if so, see if there are any sleep studies in it and will call me back.  Gweneth Dimitri RN  December 26, 2009 12:58 PM    Additional Follow-up for Phone Call Additional follow up Details #3:: Details for Additional Follow-up Action Taken: Received fax from [place] at Dr. Claris Che office along with fax from [facility], they do not have copies of pt's sleep studies.  Called [facility] back, spoke with Denean and asked her to actually look for it.  She stated she did and does not have it.  Called [facility], spoke [place].  Per Koleen Nimrod,  she cannot send me a copy of the sleep study due HIPPA and cannot tell me if they have a sleep study on file for the pt but states she will send the request for sleep study to the approprate department.  States if they have a Release of Info on file for the pt and have a sleep study, that department will fax results to triage fax # but if they do not have this info they will call to let me know.  Will await call or fax.  Gweneth Dimitri RN  December 27, 2009 4:51 PM    still havenot heard anything from [facility] reqarding this yet.  Called [facility] again, spoke with Fayrene Fearing.  Per Fayrene Fearing, they do have a previous sleep study and he will fax it to [phone].  Will await fax.  Gweneth Dimitri RN  January 01, 2010 4:46 PM   sleep study received.  will place with pt at ov on 01-11-10 per PW request.  Gweneth Dimitri RN  January 01, 2010 4:57 PM

## 2010-12-05 NOTE — Progress Notes (Signed)
Summary: refill request for alprazolam  Phone Note Refill Request Message from:  Fax from Pharmacy  Refills Requested: Medication #1:  XANAX 0.5 MG TABS Take 1 tablet by mouth three times a day as needed   Last Refilled: 12/20/2009 [facility] [address] [phone].  Initial call taken by: Lowella Petties CMA,  January 25, 2010 1:10 PM  Follow-up for Phone Call        px written on EMR for call in  Follow-up by: Judith Part MD,  January 25, 2010 1:17 PM  Additional Follow-up for Phone Call Additional follow up Details #1::        Rx called to pharmacy Additional Follow-up by: Linde Gillis CMA Duncan Dull),  January 25, 2010 2:32 PM    New/Updated Medications: XANAX 0.5 MG TABS (ALPRAZOLAM) Take 1 tablet by mouth three times a day as needed Prescriptions: XANAX 0.5 MG TABS (ALPRAZOLAM) Take 1 tablet by mouth three times a day as needed  #60 x 0   Entered and Authorized by:   Judith Part MD   Signed by:   Linde Gillis CMA (AAMA) on 01/25/2010   Method used:   Telephoned to ...       [facility] [phone]* (retail)       [address] [place], [place]  [address]       Ph: [phone] or [phone]       Fax: [phone]   RxID:   [id number]

## 2010-12-05 NOTE — Letter (Signed)
Summary: Replacement for CPAP Supplies/[facility]  Replacement for CPAP Supplies/[facility]   Imported By: Sherian Rein 03/28/2010 13:45:11  _____________________________________________________________________  External Attachment:    Type:   Image     Comment:   External Document

## 2010-12-05 NOTE — Assessment & Plan Note (Signed)
Summary: ? UTI   Vital Signs:  Patient profile:   65 year old female Weight:      155 pounds Temp:     98.4 degrees F oral Pulse rate:   92 / minute Pulse rhythm:   regular BP sitting:   120 / 86  (left arm) Cuff size:   regular  Vitals Entered By: Sydell Axon LPN (November 07, 2009 12:10 PM) CC: ? UTI, burning and painful urination, feels terrible   History of Present Illness: Pt here for burning with urination but also has strong odor of urine which she relates to a urinary infection....we discussed this at length. She has no CVAT nor LBP but also has right gtr trochanter area pain with radiculoathy down the right lateral nad ant thigh. This started a week or more ago. She is able to walk easily but has this pain often with sitting or getting up. She also has significant congestion she has had since Christmas day and now feel like she has been run over by a truck. She has nonproductive cough and some rhinitis, both clear. She has had chills but no fever. She had Augmentin and took it for UTI before coming in here for the last two days. She did not know it was Augmenting however as it had the generic name on it and it significantly upset her stomach, whicvh is what Augmentin had done before. She does not want to take Augmentin again. We discussed sensitivity vs allergy.   Problems Prior to Update: 1)  Uti  (ICD-599.0) 2)  Allergic Rhinitis  (ICD-477.9) 3)  Grief Reaction  (ICD-309.0) 4)  Shortness of Breath  (ICD-786.05) 5)  Allergy, Environmental/seasonal  (ICD-995.3) 6)  Chronic Obstructive Pulmonary Disease, Oxygen Dependent  (ICD-496) 7)  Hypertension  (ICD-401.9) 8)  Unspecified Ulceration of Vulva  (ICD-616.50) 9)  Tobacco Abuse  (ICD-305.1) 10)  Mixed Incontinence Urge and Stress  (ICD-788.33) 11)  Vaginitis, Atrophic  (ICD-627.3) 12)  Chronic Obstructive Pulmonary Disease, Acute Exacerbation  (ICD-491.21) 13)  Diabetes-type 2  (ICD-250.00) 14)  Hyperglycemia   (ICD-790.29) 15)  Hypotension  (ICD-458.9) 16)  Bronchitis, Acute With Bronchospasm  (ICD-466.0) 17)  Constipation, Chronic  (ICD-564.09) 18)  Sleep Apnea  (ICD-780.57) 19)  Facial Pain  (ICD-784.0) 20)  Fibrocystic Breast Disease  (ICD-610.1) 21)  Neck Pain  (ICD-723.1) 22)  Anxiety  (ICD-300.00) 23)  Osteopenia  (ICD-733.90) 24)  Hyperlipidemia  (ICD-272.4) 25)  Diverticulosis, Colon  (ICD-562.10)  Medications Prior to Update: 1)  Advair Diskus 250-50 Mcg/dose Misc (Fluticasone-Salmeterol) .... Use One Puff Two Times A Day 2)  Xanax 0.5 Mg Tabs (Alprazolam) .... Take 1 Tablet By Mouth Three Times A Day As Needed 3)  Ventolin Hfa 108 (90 Base) Mcg/act  Aers (Albuterol Sulfate) .Marland Kitchen.. 1-2 Puffs Every 4-6 Hours As Needed 4)  Spiriva Handihaler 18 Mcg  Caps (Tiotropium Bromide Monohydrate) .... Inhale Contents of 1 Capsule Once A Day 5)  Cvs Saline Nasal Spray 0.65 %  Soln (Saline) .Marland Kitchen.. 1-2 Puffs Each Nostril 3-4x Day 6)  Flonase 50 Mcg/act  Susp (Fluticasone Propionate) .... Two Puffs Each Nostril Daily 7)  Oxygen .... 2l Continuous 8)  Metformin Hcl 500 Mg  Tabs (Metformin Hcl) .... Take 1 Tablet By Mouth Two Times A Day 9)  Zyrtec Allergy 10 Mg  Tabs (Cetirizine Hcl) .Marland Kitchen.. 1 At Bedtime For Allergies 10)  Cpap .Marland Kitchen.. 10cmh20 With 2l 11)  Sertraline Hcl 100 Mg Tabs (Sertraline Hcl) .... One Tab By Mouth Once  Daily 12)  Cozaar 100 Mg Tabs (Losartan Potassium) .... One Tab By Mouth Once Daily 13)  Cvs Aspirin 325 Mg Tabs (Aspirin) .... Take 1 Tablet By Mouth Once A Day 14)  Tessalon 200 Mg Caps (Benzonatate) .Marland Kitchen.. 1 By Mouth Three Times A Day As Needed Cough 15)  Promethazine Vc/codeine 6.25-5-10 Mg/4ml Syrp (Phenyleph-Promethazine-Cod) .Marland Kitchen.. 1-2 Tsp Every 4-6 Hr As Needed Cough 16)  Zithromax Tri-Pak 500 Mg Tabs (Azithromycin) .... As Dir 17)  Prednisone 20 Mg Tabs (Prednisone) .... 2 Tabs By Mouth in Am For Three Days, Then Decrease By 1/2 Tab Every 2 Days. Then Take 5mg  Daily For 5 Days. 18)   Prednisone 5 Mg Tabs (Prednisone) .... One Tab By Mouth in Am For 5 Days After Finishing The 20 Mg Tab Script.  Allergies: 1)  ! Augmentin (Amoxicillin-Pot Clavulanate) 2)  Erythromycin Base (Erythromycin Base)  Physical Exam  General:  Well-developed,well-nourished,in no acute distress; alert,appropriate and cooperative throughout examination, congested but nml RR. Head:  Normocephalic and atraumatic without obvious abnormalities. No apparent alopecia or balding. Sinuses NT. Eyes:  Conjunctiva clear bilaterally. Mild "alllergic shiners" bilat. Ears:  External ear exam shows no significant lesions or deformities.  Otoscopic examination reveals clear canals, tympanic membranes are intact bilaterally without bulging, retraction, inflammation or discharge. Hearing is grossly normal bilaterally. Nose:  Mild congestion with clear mucous. Right nare swollen. Mouth:  pharynx pink and moist, no erythema, and no exudates.  Mild tan PND. Neck:  No deformities, masses, or tenderness noted. Mild anterior cervical adenopathy bilat. Lungs:  mild ronchi, fine in bilat midfields with very fine faint wheezing anteriorally. Heart:  Normal rate and regular rhythm. S1 and S2 normal without gallop, murmur, click, rub or other extra sounds. Abdomen:  soft, non-tender, normal bowel sounds, no distention, and no guarding.  No suprapubic tenderness. Msk:  No CVAT.   Impression & Recommendations:  Problem # 1:  UTI (ICD-599.0) Assessment New Dip pos, micro equivocal. Will treat with Bactrim and culture. The following medications were removed from the medication list:    Zithromax Tri-pak 500 Mg Tabs (Azithromycin) .Marland Kitchen... As dir Her updated medication list for this problem includes:    Bactrim Ds 800-160 Mg Tabs (Sulfamethoxazole-trimethoprim) ..... One tab by mouth two times a day  Orders: Specimen Handling (04540) T-Culture, Urine ([id number]) UA Dipstick W/ Micro (manual) (81000)  Problem # 2:  URI  (ICD-465.9) Assessment: New  Appears to be Viral illness, poss flu. See instructions. Her updated medication list for this problem includes:    Zyrtec Allergy 10 Mg Tabs (Cetirizine hcl) .Marland Kitchen... 1 at bedtime for allergies    Cvs Aspirin 325 Mg Tabs (Aspirin) .Marland Kitchen... Take 1 tablet by mouth once a day    Tessalon 200 Mg Caps (Benzonatate) .Marland Kitchen... 1 by mouth three times a day as needed cough    Promethazine Vc/codeine 6.25-5-10 Mg/3ml Syrp (Phenyleph-promethazine-cod) .Marland Kitchen... 1-2 tsp every 4-6 hr as needed cough  Instructed on symptomatic treatment. Call if symptoms persist or worsen.   Problem # 3:  PAIN IN JOINT PELVIC REGION AND THIGH (ICD-719.45) Assessment: New  Heat and ice, Aleve and rest, per instructions. Her updated medication list for this problem includes:    Cvs Aspirin 325 Mg Tabs (Aspirin) .Marland Kitchen... Take 1 tablet by mouth once a day  Discussed use of medications, application of heat or cold, and exercises.   Complete Medication List: 1)  Advair Diskus 250-50 Mcg/dose Misc (Fluticasone-salmeterol) .... Use one puff two times a day 2)  Xanax  0.5 Mg Tabs (Alprazolam) .... Take 1 tablet by mouth three times a day as needed 3)  Ventolin Hfa 108 (90 Base) Mcg/act Aers (Albuterol sulfate) .Marland Kitchen.. 1-2 puffs every 4-6 hours as needed 4)  Spiriva Handihaler 18 Mcg Caps (Tiotropium bromide monohydrate) .... Inhale contents of 1 capsule once a day 5)  Cvs Saline Nasal Spray 0.65 % Soln (Saline) .Marland Kitchen.. 1-2 puffs each nostril 3-4x day 6)  Flonase 50 Mcg/act Susp (Fluticasone propionate) .... Two puffs each nostril daily 7)  Oxygen  .... 2l continuous 8)  Metformin Hcl 500 Mg Tabs (Metformin hcl) .... Take 1 tablet by mouth two times a day 9)  Zyrtec Allergy 10 Mg Tabs (Cetirizine hcl) .Marland Kitchen.. 1 at bedtime for allergies 10)  Cpap  .Marland Kitchen.. 10cmh20 with 2l 11)  Sertraline Hcl 100 Mg Tabs (Sertraline hcl) .... One tab by mouth once daily 12)  Cozaar 100 Mg Tabs (Losartan potassium) .... One tab by mouth once  daily 13)  Cvs Aspirin 325 Mg Tabs (Aspirin) .... Take 1 tablet by mouth once a day 14)  Tessalon 200 Mg Caps (Benzonatate) .Marland Kitchen.. 1 by mouth three times a day as needed cough 15)  Promethazine Vc/codeine 6.25-5-10 Mg/6ml Syrp (Phenyleph-promethazine-cod) .Marland Kitchen.. 1-2 tsp every 4-6 hr as needed cough 16)  Bactrim Ds 800-160 Mg Tabs (Sulfamethoxazole-trimethoprim) .... One tab by mouth two times a day  Patient Instructions: 1)  Use heat and ice as directed. 2)  Take Aleve 2 tabs after brfst and supper. 3)  Drink lots of fluids. 4)  Take Bactrim two times a day for ten days.  5)  Take Guaifenesin by [facility] or [facility] and getting MUCOUS RELIEF EXPECTORANT (400mg ), take 11/2 tabs by mouth AM and NOON. 6)  Drink lots of fluids anytime taking Guaifenesin.  Prescriptions: BACTRIM DS 800-160 MG TABS (SULFAMETHOXAZOLE-TRIMETHOPRIM) one tab by mouth two times a day  #20 x 0   Entered and Authorized by:   Shaune Leeks MD   Signed by:   Shaune Leeks MD on 11/07/2009   Method used:   [facility]        [facility]  [address] [id number]       [address]       [place], [place]  [address]       Ph: [phone] or [phone]       Fax: [phone]   RxID:   [id number]   Current Allergies (reviewed today): ! AUGMENTIN (AMOXICILLIN-POT CLAVULANATE) ERYTHROMYCIN BASE (ERYTHROMYCIN BASE)  Laboratory Results   Urine Tests  Date/Time Received: November 07, 2009 12:24 PM  Date/Time Reported: November 07, 2009 12:24 PM   Routine Urinalysis   Color: yellow Appearance: Cloudy Glucose: negative   (Normal Range: Negative) Bilirubin: negative   (Normal Range: Negative) Ketone: negative   (Normal Range: Negative) Spec. Gravity: 1.010   (Normal Range: 1.003-1.035) Blood: large   (Normal Range: Negative) pH: 6.0   (Normal Range: 5.0-8.0) Protein: trace   (Normal Range: Negative) Urobilinogen: 0.2   (Normal Range: 0-1) Nitrite: negative    (Normal Range: Negative) Leukocyte Esterace: moderate   (Normal Range: Negative)  Urine Microscopic WBC/HPF: 0-6 RBC/HPF: 0-1 Bacteria/HPF: 2-3+ Epithelial/HPF: 2-4

## 2010-12-05 NOTE — Letter (Signed)
Summary: [name] letter  [name] [facility]  [address] [place], [place] [address]   Phone: [phone]  Fax: [phone]       06/11/2010 MRN: [id number]  Tami Skinner [address] [place], [place]  [address]  Dear Ms. [name],  [facility] [place], and [facility] announce the retirement of [name], M.D., from full-time practice at the [facility] office effective May 02, 2010 and his plans of returning part-time.  It is important to [name] and to our practice that you understand that [facility] has seven physicians in our office for your health care needs.  We will continue to offer the same exceptional care that you have today.    [name] has spoken to many of you about his plans for retirement and returning part-time in the fall.   We will continue to work with you through the transition to schedule appointments for you in the office and meet the high standards that [place] is committed to.   Again, it is with great pleasure that we share the news that [name] will return to [facility] in October of 2011 with a reduced schedule.    If you have any questions, or would like to request an appointment with one of our physicians, please call us at [phone] and press the option for Scheduling an appointment.  We take pleasure in providing you with excellent patient care and look forward to seeing you at your next office visit.  Our [facility] Physicians are:  [name], M.D. [name], M.D. [name], M.D. [name], M.D. [name], M.D. [name], M.D. We proudly welcomed [name], M.D. and [name], M.D. to the practice in July/August 2011.  Sincerely,  [name]

## 2011-01-27 ENCOUNTER — Ambulatory Visit: Payer: Self-pay | Admitting: Ophthalmology

## 2011-03-18 NOTE — Discharge Summary (Signed)
Tami Skinner, Tami Skinner NO.:  [id number]   MEDICAL RECORD NO.:  [id number]          PATIENT TYPE:  INP   LOCATION:  1342                         FACILITY:  [facility]   PHYSICIAN:  Charlcie Cradle. Delford Field, MD, FCCPDATE OF BIRTH:  11-02-1946   DATE OF ADMISSION:  12/28/2007  DATE OF DISCHARGE:  12/31/2007                               DISCHARGE SUMMARY   DISCHARGE DIAGNOSES:  1. Acute exacerbation of chronic obstructive pulmonary disease with      asthmatic bronchitic flare with an anxiety component.  2. History of tobacco abuse and anxiety.  3. Diabetes mellitus.  4. Hypertension.   HISTORY OF PRESENT ILLNESS:  Tami Skinner is a 65 year old white  female pulmonary patient of Dr. Shan Levans and a primary care  patient of Dr. Bosie Clos at the [facility].  She  presents with a chief complaint of increasing shortness of breath and  cough.  She was admitted to [facility] and underwent CT of the  sinuses, which was negative, just showed inflammation, and chest x-ray  which was unremarkable.  She was noted to have saturations of 92% on 3 L  and was found to be O2-dependent and was admitted with a chronic  obstructive pulmonary disease acute exacerbation, asthmatic bronchitic  flare, questionable sinusitis.   LABORATORY DATA:  As noted, CT scan of the sinuses showed inflammation  without infection.  Chest x-ray was unremarkable.  Hemoglobin 13.4,  hematocrit 30.9, WBC 11.3, platelets are 259, MCV is 92.  Sodium 141,  potassium 4.5, chloride 105, CO2 38, BUN 15, creatinine 0.8, glucose is  152, AST is 19, ALT is 16, alkaline phosphatase 50, total bilirubin 0.6,  albumin is 3.8, calcium is 9.2.  Urinalysis was unremarkable.  BNP was  noted to be less than 30.   HOSPITAL COURSE BY DISCHARGE DIAGNOSIS:  1. Acute exacerbation of chronic obstructive pulmonary disease with      asthmatic bronchitic flare with an anxiety component, with recent    tobacco cessation.  She was noted to be O2-dependent.  She was      ambulated in the hall and desaturated to 80%; therefore, she will      require oxygen at this time.  She was treated with the usual      pharmaceutical interventions of IV steroids, IV antibiotics,      nebulized bronchodilators.  She reached maximal hospital benefit by      December 31, 2007, and was ready for discharge home.  Of note, she      will be O2-dependent.  She has been on ACE inhibitors and has a      component of questionable vocal cord dysfunction with a barking      cough before she was changed over to Avapro ARB for this.  She will      continue on a steroid taper.  She will continue her antibiotics for      completion of 10 days of antimicrobial therapy.  She was placed on      Robitussin-D for cough suppression and she will  require 2L nasal      cannula on a daily basis.  We also restarted her Spiriva and      resumed her Advair on the day of discharge.   1. History of tobacco abuse and anxiety.  She has been on Xanax and      Nicoderm.  Of note, she was placed on Xanax 0.5 mg t.i.d. while in      the hospital and she notes that she takes sometimes as much as a      milligram t.i.d.  Her Xanax is written by Dr. Bosie Clos at [facility] and he can follow this on an outpatient basis.   1. Diabetes mellitus.  She is on steroids but had adequate control      with metformin.   1. Hypertension.  Again, her ACE inhibitor has been discharged.  She      has been placed on an ARB, Avapro 500 mg daily for questionable      airway issue.  This can be evaluated at a later date.   DISCHARGE MEDICATIONS:  1. Nicoderm 14 mg x14 days, then 7 mg x 14 days, change patch daily,      then stop.  2. Advair 250/50 mcg one puff two times a day.  3. Ventolin HFA two puffs as a rescue medication not to exceed three      times a day.  4. Metformin 500 mg one tablet daily.  5. Prednisone on taper, 40 mg for 4 days, 30  mg for 4 days, 20 mg for      4 days, 10 mg for 4 days, then stop.  6. Spiriva 18 mcg one inhalation daily.  7. Avelox 400 mg a day until gone.  8. Xanax 0.5 mg three times a day.  Of note, she will take this      medication as she has taken it in the past and she is to bring that      information to Rubye Oaks, NP, when she follows up and Dr.      Bosie Clos so we can have a more accurate input into how much Xanax      she is actually utilizing.  9. Avapro 300 mg daily.  Note, this is a new medication with the      discontinuation of her lisinopril.  10.Nasal salt water one to two puffs each nostril 3-4 times a day.  11.Flonase one puff to each nostril daily.  12.O2 24 hours a day, 2 L nasal cannula.   SPECIAL INSTRUCTIONS:  1. No lisinopril and no placing Vicks' Vapo-Rub in her nostrils.  It      is okay for her to use a humidifier.  She is to continue to wear      her CPAP nightly.  2. Continue her smoking cessation.   She has a follow-up appointment with Rubye Oaks, NP, on March 6 at 3  p.m.   DISPOSITION/CONDITION ON DISCHARGE:      Devra Dopp, MSN, ACNP      Charlcie Cradle. Delford Field, MD, [facility]  Electronically Signed    SM/MEDQ  D:  12/31/2007  T:  12/31/2007  Job:  [id number]   cc:   Shirley Friar, MD  Fax: [phone]

## 2011-03-18 NOTE — Assessment & Plan Note (Signed)
[facility]                             PULMONARY OFFICE NOTE   Tami Skinner, Tami Skinner                       MRN:          [id number]  DATE:03/17/2007                            DOB:          1945/12/14    [name] is a 65 year old Caucasian female, history of chronic  obstructive lung disease, asthmatic bronchitis.  She is coughing up  thick yellow mucus, having increased wheezing and shortness of breath.   Maintains:  1. Advair 250/50 one spray b.i.d.  2. Spiriva daily.   EXAMINATION:  Temperature 97.  Blood pressure 114/80.  Pulse 107.  Saturation was 94% on room air.  CHEST:  Showed inspiratory and expiratory wheeze with a very poor  airflow.  CARDIAC EXAM:  Showed a regular rate and rhythm without S3.  Normal S1  and S2.  ABDOMEN:  Soft.  Non-tender.  EXTREMITIES:  Showed no edema or clubbing.  SKIN:  Was clear.   IMPRESSION:  Is that of chronic obstructive pulmonary disease with  asthmatic bronchitic flare.   PLAN:  For this patient is to receive pulse prednisone at 40 mg a day,  tapered down by 10 mg every 4 days until off.  She will receive Levaquin  750 mg daily for a 5-day course.  Return to this office in 1 month.  A  Nicotrol inhaler was also prescribed, as she has failed the Chantix  program for smoking cessation.     Charlcie Cradle Delford Field, MD, [facility]  Electronically Signed    PEW/MedQ  DD: 03/17/2007  DT: 03/17/2007  Job #: [id number]

## 2011-03-21 NOTE — Assessment & Plan Note (Signed)
[facility]                             PULMONARY OFFICE NOTE   NAME:Tami Skinner, Tami Skinner                       MRN:          [id number]  DATE:01/14/2007                            DOB:          [birth year]    HISTORY OF PRESENT ILLNESS:  Patient is a 66 year old white female  patient of Dr. Delford Field with a known history of COPD with an asthmatic  bronchitic component who presents for an acute office visit.  Patient  complains over the last week that she has had a cough and congestion,  and was seen at Urgent Care at [facility].  She was prescribed Augmentin  for a total of 10 days of therapy, and a prednisone taper.  She is  currently on 40 mg.  She reports symptoms are slowly improving, however,  she continues to have cough and congestion.  She denies any hemoptysis,  orthopnea, PND, or leg swelling.   PAST MEDICAL HISTORY:  Reviewed.   CURRENT MEDICATIONS:  Reviewed.   PHYSICAL EXAMINATION:  Patient is a pleasant female, in no acute  distress.  She is afebrile with stable vital signs.  Her 02 saturation is 92% on  room air.  HEENT:  Nasal mucosa with some mild erythema.  Non-tender sinuses.  Posterior pharynx is clear. NECK:  Supple without cervical adenopathy.  No JVD.  LUNG SOUNDS:  Reveal coarse breath sounds bilaterally.  CARDIAC:  Regular rate.  ABDOMEN:  Soft and non-tender.  EXTREMITIES:  Warm without any edema.   IMPRESSION AND PLAN:  Asthmatic bronchitic exacerbation with probable  early sinusitis.  Patient is to finish antibiotics and steroids as  recommended.  Add in Mucinex DM twice a day.  Protonix 40 mg daily over  the next few weeks for any residual reflux that could be irritating the  airway.  Patient was given a Xopenex nebulizer treatment.  Patient will  follow back up with Dr. Delford Field in 2 weeks, or sooner if needed.      Rubye Oaks, NP  Electronically Signed      Charlcie Cradle Delford Field, MD, [facility]  Electronically Signed   TP/MedQ  DD: 01/14/2007  DT: 01/15/2007  Job #: [id number]

## 2011-03-21 NOTE — Assessment & Plan Note (Signed)
[facility]                               PULMONARY OFFICE NOTE   CORNISHA, ZETINO                       MRN:          [id number]  DATE:05/12/2006                            DOB:          [birth year]    Ms. Tami Skinner is a 65 year old white female, history of chronic obstructive  lung disease, asthmatic bronchitis.  I have not seen this patient since  2005.  At that time we had her on Spiriva daily, and Pulmicort 4 puffs  b.i.d., Combivent p.r.n.  This patient is currently more dyspneic and still  smoking a half pack of cigarettes a day, but is now on the Chantix therapy  for the past month, but is still smoking.  She has switched now over to the  Advair product at 250/50 1 spray b.i.d., is off Pulmicort, maintains Spiriva  daily.   PHYSICAL EXAMINATION:  VITAL SIGNS:  Temp 98, blood pressure 110/82, pulse  98, saturation 94% on room air.  CHEST:  Showed prolonged expiratory phase with no wheeze or rhonchi.  CARDIAC:  Exam showed a regular rate and rhythm without S3, normal S1, S2.  ABDOMEN:  Soft, nontender.  EXTREMITIES:  Showed no edema clubbing.  SKIN:  Clear.  NEUROLOGIC:  Exam was intact.  HEENT:  Exam showed no jugular venous distention, lymphadenopathy.  Oropharynx is clear.  NECK:  Supple.   IMPRESSION:  Chronic obstructive lung disease, asthmatic bronchitic  component.   PLAN:  For this patient to obtain a full set of pulmonary function studies,  will increase Advair to 500/50 one spray b.i.d., and maintain Spiriva as is.  Continue to pursue smoking cessation, and continue the Chantix therapy.  She  will return to this office in 6 weeks.                                   Charlcie Cradle Delford Field, MD, FCCP   PEW/MedQ  DD:  05/12/2006  DT:  05/13/2006  Job #:  [id number]

## 2011-03-24 ENCOUNTER — Ambulatory Visit: Payer: Self-pay | Admitting: Ophthalmology

## 2011-07-25 LAB — COMPREHENSIVE METABOLIC PANEL
ALT: 16
AST: 19
Albumin: 3.8
CO2: 33 — ABNORMAL HIGH
Calcium: 9.3
GFR calc Af Amer: >60
Sodium: 138
Total Protein: 6.4

## 2011-07-25 LAB — BASIC METABOLIC PANEL
CO2: 30
Calcium: 9.2
Chloride: 105
Creatinine, Ser: 0.8
GFR calc Af Amer: >60
Sodium: 141

## 2011-07-25 LAB — URINE MICROSCOPIC-ADD ON

## 2011-07-25 LAB — CBC
Hemoglobin: 13.4
MCHC: 33.5
MCHC: 34.2
Platelets: 239
RBC: 4.33
RBC: 4.73
RDW: 13.7
WBC: 11.3 — ABNORMAL HIGH

## 2011-07-25 LAB — URINE CULTURE
Colony Count: NO GROWTH
Culture: NO GROWTH
Special Requests: NEGATIVE

## 2011-07-25 LAB — URINALYSIS, ROUTINE W REFLEX MICROSCOPIC
Bilirubin Urine: NEGATIVE
Glucose, UA: NEGATIVE
Ketones, ur: NEGATIVE
Specific Gravity, Urine: 1.02
pH: 7

## 2011-07-25 LAB — DIFFERENTIAL
Eosinophils Absolute: 0
Eosinophils Relative: 0
Lymphocytes Relative: 22
Lymphs Abs: 1.6
Monocytes Absolute: 0.8
Monocytes Relative: 11

## 2011-09-04 ENCOUNTER — Ambulatory Visit: Payer: Self-pay | Admitting: Internal Medicine

## 2011-09-04 ENCOUNTER — Telehealth: Payer: Self-pay | Admitting: Internal Medicine

## 2011-09-04 ENCOUNTER — Ambulatory Visit (INDEPENDENT_AMBULATORY_CARE_PROVIDER_SITE_OTHER): Payer: BC Managed Care – PPO | Admitting: Internal Medicine

## 2011-09-04 ENCOUNTER — Encounter: Payer: Self-pay | Admitting: Internal Medicine

## 2011-09-04 VITALS — BP 128/82 | HR 94 | Temp 98.4°F | Resp 16 | Ht 65.5 in | Wt 150.0 lb

## 2011-09-04 DIAGNOSIS — R110 Nausea: Secondary | ICD-10-CM

## 2011-09-04 DIAGNOSIS — R1011 Right upper quadrant pain: Secondary | ICD-10-CM

## 2011-09-04 DIAGNOSIS — J4489 Other specified chronic obstructive pulmonary disease: Secondary | ICD-10-CM

## 2011-09-04 DIAGNOSIS — E119 Type 2 diabetes mellitus without complications: Secondary | ICD-10-CM

## 2011-09-04 DIAGNOSIS — K469 Unspecified abdominal hernia without obstruction or gangrene: Secondary | ICD-10-CM

## 2011-09-04 DIAGNOSIS — J449 Chronic obstructive pulmonary disease, unspecified: Secondary | ICD-10-CM | POA: Insufficient documentation

## 2011-09-04 MED ORDER — OMEPRAZOLE 40 MG PO CPDR: 40.0000 mg | DELAYED_RELEASE_CAPSULE | Freq: Every day | ORAL | Status: DC

## 2011-09-04 MED ORDER — ONDANSETRON HCL 8 MG PO TABS: 8.0000 mg | ORAL_TABLET | Freq: Three times a day (TID) | ORAL | Status: DC | PRN

## 2011-09-04 NOTE — Telephone Encounter (Signed)
Informed patient that her CT was ok per MD  She has an hernia in her mid abdomen that has been there for awhile and we would like to set her up with Dr. Katrinka Blazing for eval. Pt. Agreed and referral  Entered.

## 2011-09-04 NOTE — Progress Notes (Signed)
Subjective:    Patient ID: Tami Skinner, female    DOB: 1945-11-08, 65 y.o.   MRN: [id number]  HPI  65 year old female with a history of diabetes, COPD presents to establish care. Her primary concern today is a one month history of right lower quadrant pain. She brings a lab work with her today from her previous physician including CBC, CMP, TSH, and lipids all of which were normal. She has not had any imaging and evaluation of her abdominal pain. She notes that her abdominal pain is aching in nature. It typically is worsened with eating. She describes some nausea associated with the pain and the nausea is particularly bad with eating. She denies any vomiting. At one point several months ago she did have some black stools, however this has since resolved. She notes that her previous physician checked her stool for occult blood it was negative. She has not had any diarrhea. She does have a history of diverticulitis but this pain is not consistent with her previous symptoms. She denies any fever or chills. She denies any cough or shortness of breath.  In regards to her diabetes, she reports good control of her blood sugars and brings record of recent hemoglobin A1c which was 6.5%. She denies any symptomatic low blood sugars or blood sugars greater than 200. She reports full compliance with her metformin and Onglyza.  Patient with history of COPD. She reports full compliance with her medications including Spiriva and albuterol and Advair. She uses oxygen at night. She is also on Daliresp. She continues to smoke. She denies any recent increase in cough or shortness of breath.  Outpatient Encounter Prescriptions as of 09/04/2011  Medication Sig Dispense Refill  . albuterol (PROVENTIL HFA;VENTOLIN HFA) 108 (90 BASE) MCG/ACT inhaler Inhale 2 puffs into the lungs every 6 (six) hours as needed.        . ALPRAZolam (XANAX) 0.5 MG tablet Take 0.5 mg by mouth at bedtime as needed. 1/2 tablet in am and 1 tablet  at bedtime        . Fluticasone-Salmeterol (ADVAIR DISKUS) 250-50 MCG/DOSE AEPB Inhale 1 puff into the lungs every 4 (four) hours as needed.        . metFORMIN (GLUCOPHAGE) 500 MG tablet Take 500 mg by mouth 2 (two) times daily with a meal.        . OXYGEN-HELIUM IN Inhale 2 % into the lungs at bedtime.        . roflumilast (DALIRESP) 500 MCG TABS tablet Take 500 mcg by mouth daily.        . saxagliptin HCl (ONGLYZA) 2.5 MG TABS tablet Take 2.5 mg by mouth daily.        . sertraline (ZOLOFT) 50 MG tablet Take 50 mg by mouth daily.        Marland Kitchen tiotropium (SPIRIVA HANDIHALER) 18 MCG inhalation capsule Place 18 mcg into inhaler and inhale daily.        . valsartan (DIOVAN) 160 MG tablet Take 160 mg by mouth daily.        Marland Kitchen omeprazole (PRILOSEC) 40 MG capsule Take 1 capsule (40 mg total) by mouth daily.  30 capsule  1  . ondansetron (ZOFRAN) 8 MG tablet Take 1 tablet (8 mg total) by mouth every 8 (eight) hours as needed for nausea.  20 tablet  0    Review of Systems  Constitutional: Negative for fever, chills, appetite change, fatigue and unexpected weight change.  HENT: Negative for ear pain, congestion, sore  throat, trouble swallowing, neck pain, voice change and sinus pressure.   Eyes: Negative for visual disturbance.  Respiratory: Negative for cough, shortness of breath, wheezing and stridor.   Cardiovascular: Negative for chest pain, palpitations and leg swelling.  Gastrointestinal: Positive for nausea, abdominal pain and abdominal distention. Negative for vomiting, diarrhea, constipation, blood in stool and anal bleeding.  Genitourinary: Negative for dysuria and flank pain.  Musculoskeletal: Negative for myalgias, arthralgias and gait problem.  Skin: Negative for color change and rash.  Neurological: Negative for dizziness and headaches.  Hematological: Negative for adenopathy. Does not bruise/bleed easily.  Psychiatric/Behavioral: Negative for suicidal ideas, sleep disturbance and dysphoric  mood. The patient is not nervous/anxious.    BP 128/82  Pulse 94  Temp(Src) 98.4 F (36.9 C) (Oral)  Resp 16  Ht 5' 5.5" (1.664 m)  Wt 150 lb (68.04 kg)  BMI 24.58 kg/m2  SpO2 100%     Objective:   Physical Exam  Constitutional: She is oriented to person, place, and time. She appears well-developed and well-nourished. No distress.  HENT:  Head: Normocephalic and atraumatic.  Right Ear: External ear normal.  Left Ear: External ear normal.  Nose: Nose normal.  Mouth/Throat: Oropharynx is clear and moist. No oropharyngeal exudate.  Eyes: Conjunctivae are normal. Pupils are equal, round, and reactive to light. Right eye exhibits no discharge. Left eye exhibits no discharge. No scleral icterus.  Neck: Normal range of motion. Neck supple. No tracheal deviation present. No thyromegaly present.  Cardiovascular: Normal rate, regular rhythm, normal heart sounds and intact distal pulses.  Exam reveals no gallop and no friction rub.   No murmur heard. Pulmonary/Chest: Effort normal. No accessory muscle usage. Not tachypneic. No respiratory distress. She has decreased breath sounds. She has no wheezes. She has no rales. She exhibits no tenderness.    Abdominal: There is no hepatosplenomegaly. There is tenderness in the right upper quadrant and right lower quadrant. There is guarding. There is no rigidity and no rebound.  Musculoskeletal: Normal range of motion. She exhibits no edema and no tenderness.  Lymphadenopathy:    She has no cervical adenopathy.  Neurological: She is alert and oriented to person, place, and time. No cranial nerve deficit. She exhibits normal muscle tone. Coordination normal.  Skin: Skin is warm and dry. No rash noted. She is not diaphoretic. No erythema. No pallor.  Psychiatric: She has a normal mood and affect. Her behavior is normal. Judgment and thought content normal.          Assessment & Plan:  1. Right lower quadrant abdominal pain -patient reports a  one-month history of right lower quadrant pain and weight loss of nearly 20 pounds. There is no palpable abnormality on exam. I am concerned about both the possibility of malignancy and the potential of ischemic colitis given her long smoking history. She is status post appendectomy. Will get CT of the abdomen for further evaluation.  2. Diabetes mellitus -patient with a history of diabetes mellitus which she reports has been well controlled. Recent hemoglobin A1c was at goal. She is on an ARB. She is on a statin. Will request records as to previous evaluation and treatment. She will followup for diabetes care in 3 months.  3. COPD -patient with a history of COPD. She reports compliance with her medications. She continues to smoke. Will request records on her previous evaluation and treatment.

## 2011-09-05 ENCOUNTER — Telehealth: Payer: Self-pay | Admitting: Internal Medicine

## 2011-09-05 DIAGNOSIS — R110 Nausea: Secondary | ICD-10-CM

## 2011-09-05 MED ORDER — PROMETHAZINE HCL 25 MG PO TABS: 25.0000 mg | ORAL_TABLET | Freq: Four times a day (QID) | ORAL | Status: AC | PRN

## 2011-09-05 NOTE — Telephone Encounter (Signed)
Can you call her and ask specifally what questions she has?  We can bring her in next week to discuss options as well.

## 2011-09-05 NOTE — Telephone Encounter (Signed)
Patient wants Dr. Dan Humphreys to call . She wants to discuss her treatment plan.

## 2011-09-05 NOTE — Telephone Encounter (Signed)
Patient informed - She would like referral, I entered order.

## 2011-09-05 NOTE — Telephone Encounter (Signed)
She stated she has been out of work for a week and she can't take the nausea anymore, she needs to go back to work.  She said the Zofran isn't working and also wanted to know if she could take Miralax.

## 2011-09-05 NOTE — Telephone Encounter (Signed)
Fine to take miralax.  If zofran not working, then we can try phenergan 25mg  po q6hr prn nausea.  We should set her up with GI if symptoms are persistent. Dr. Skeet Simmer is usually good about working our patients in quickly.

## 2011-09-11 ENCOUNTER — Encounter: Payer: Self-pay | Admitting: Internal Medicine

## 2011-09-11 ENCOUNTER — Other Ambulatory Visit: Payer: Self-pay | Admitting: Internal Medicine

## 2011-09-11 ENCOUNTER — Ambulatory Visit (INDEPENDENT_AMBULATORY_CARE_PROVIDER_SITE_OTHER): Payer: BC Managed Care – PPO | Admitting: Internal Medicine

## 2011-09-11 VITALS — BP 138/84 | HR 86 | Temp 98.4°F | Wt 153.0 lb

## 2011-09-11 DIAGNOSIS — R1011 Right upper quadrant pain: Secondary | ICD-10-CM

## 2011-09-11 DIAGNOSIS — R110 Nausea: Secondary | ICD-10-CM

## 2011-09-11 LAB — LIPASE: Lipase: 21 U/L (ref 11.0–59.0)

## 2011-09-11 LAB — COMPREHENSIVE METABOLIC PANEL
Albumin: 4.1 g/dL (ref 3.5–5.2)
Alkaline Phosphatase: 59 U/L (ref 39–117)
BUN: 19 mg/dL (ref 6–23)
CO2: 30 mEq/L (ref 19–32)
Calcium: 9.6 mg/dL (ref 8.4–10.5)
GFR: 74.13 mL/min (ref >60.00)
Glucose, Bld: 118 mg/dL — ABNORMAL HIGH (ref 70–99)
Potassium: 4.1 mEq/L (ref 3.5–5.1)
Sodium: 141 mEq/L (ref 135–145)
Total Protein: 6.9 g/dL (ref 6.0–8.3)

## 2011-09-11 MED ORDER — SCOPOLAMINE 1 MG/3DAYS TD PT72: 1.0000 | MEDICATED_PATCH | TRANSDERMAL | Status: DC

## 2011-09-11 MED ORDER — ONDANSETRON 8 MG PO TBDP: 8.0000 mg | ORAL_TABLET | Freq: Three times a day (TID) | ORAL | Status: AC | PRN

## 2011-09-11 MED ORDER — ONDANSETRON 8 MG PO TBDP: 8.0000 mg | ORAL_TABLET | Freq: Three times a day (TID) | ORAL | Status: DC | PRN

## 2011-09-11 NOTE — Patient Instructions (Signed)
Labs today. Follow up with GI next week.

## 2011-09-11 NOTE — Telephone Encounter (Signed)
Rx completed

## 2011-09-11 NOTE — Progress Notes (Signed)
Subjective:    Patient ID: Tami Skinner, female    DOB: [birth year], 65 y.o.   MRN: [id number]  HPI 65 year old female presents to followup right upper quadrant abdominal pain and nausea. In the interim since her last visit she underwent CT scan of the abdomen which showed some possible sludge within the gallbladder but otherwise was unremarkable. She reports that she continues to have severe nausea which is limiting her ability to work. She has lost nearly 20 pounds. She reports some fatigue. She denies any fever or chills. She did have some diarrhea after her CT scan but this has resolved. She reports that she has been using her Phenergan and Zofran with no improvement in nausea.  Outpatient Encounter Prescriptions as of 09/11/2011  Medication Sig Dispense Refill  . albuterol (PROVENTIL HFA;VENTOLIN HFA) 108 (90 BASE) MCG/ACT inhaler Inhale 2 puffs into the lungs every 6 (six) hours as needed.        . ALPRAZolam (XANAX) 0.5 MG tablet Take 0.5 mg by mouth at bedtime as needed. 1/2 tablet in am and 1 tablet at bedtime        . Fluticasone-Salmeterol (ADVAIR DISKUS) 250-50 MCG/DOSE AEPB Inhale 1 puff into the lungs every 4 (four) hours as needed.        . metFORMIN (GLUCOPHAGE) 500 MG tablet Take 500 mg by mouth 2 (two) times daily with a meal.        . omeprazole (PRILOSEC) 40 MG capsule Take 1 capsule (40 mg total) by mouth daily.  30 capsule  1  . OXYGEN-HELIUM IN Inhale 2 % into the lungs at bedtime.        . promethazine (PHENERGAN) 25 MG tablet Take 1 tablet (25 mg total) by mouth every 6 (six) hours as needed for nausea.  30 tablet  0  . roflumilast (DALIRESP) 500 MCG TABS tablet Take 500 mcg by mouth daily.        . saxagliptin HCl (ONGLYZA) 2.5 MG TABS tablet Take 2.5 mg by mouth daily.        . sertraline (ZOLOFT) 50 MG tablet Take 50 mg by mouth daily.        Marland Kitchen tiotropium (SPIRIVA HANDIHALER) 18 MCG inhalation capsule Place 18 mcg into inhaler and inhale daily.        . valsartan  (DIOVAN) 160 MG tablet Take 160 mg by mouth daily.          Review of Systems  Constitutional: Positive for fatigue. Negative for fever, chills, appetite change and unexpected weight change.  Eyes: Negative for visual disturbance.  Respiratory: Negative for cough and shortness of breath.   Cardiovascular: Negative for chest pain, palpitations and leg swelling.  Gastrointestinal: Positive for nausea and abdominal pain. Negative for vomiting, diarrhea, constipation, blood in stool, abdominal distention and anal bleeding.  Genitourinary: Negative for dysuria and flank pain.  Musculoskeletal: Negative for myalgias, arthralgias and gait problem.  Skin: Negative for color change and rash.  Neurological: Negative for dizziness and headaches.  Hematological: Negative for adenopathy. Does not bruise/bleed easily.   BP 138/84  Pulse 86  Temp(Src) 98.4 F (36.9 C) (Oral)  Wt 153 lb (69.4 kg)  SpO2 91%     Objective:   Physical Exam  Constitutional: She is oriented to person, place, and time. She appears well-developed and well-nourished. No distress.  HENT:  Head: Normocephalic and atraumatic.  Right Ear: External ear normal.  Left Ear: External ear normal.  Nose: Nose normal.  Mouth/Throat: Oropharynx is  clear and moist. No oropharyngeal exudate.  Eyes: Conjunctivae are normal. Pupils are equal, round, and reactive to light. Right eye exhibits no discharge. Left eye exhibits no discharge. No scleral icterus.  Neck: Normal range of motion. Neck supple. No tracheal deviation present. No thyromegaly present.  Cardiovascular: Normal rate, regular rhythm, normal heart sounds and intact distal pulses.  Exam reveals no gallop and no friction rub.   No murmur heard. Pulmonary/Chest: Effort normal and breath sounds normal. No respiratory distress. She has no wheezes. She has no rales. She exhibits no tenderness.  Abdominal: Normal appearance and bowel sounds are normal. There is tenderness in the  right upper quadrant.  Musculoskeletal: Normal range of motion. She exhibits no edema and no tenderness.  Lymphadenopathy:    She has no cervical adenopathy.  Neurological: She is alert and oriented to person, place, and time. No cranial nerve deficit. She exhibits normal muscle tone. Coordination normal.  Skin: Skin is warm and dry. No rash noted. She is not diaphoretic. No erythema. No pallor.  Psychiatric: She has a normal mood and affect. Her behavior is normal. Judgment and thought content normal.          Assessment & Plan:  1. Right upper quadrant pain and nausea - unclear etiology of her persistent nausea. Question if it may be related to biliary sludge. She will likely need upper endoscopy for eval. she has appointment with GI medicine next week. In the interim until that appointment, will try adding a scopolamine patch to see if this helps with nausea. Will also use rapidly dissolving Zofran. Will recheck CMP, lipase, and send H. Pylori today. She will followup here in one month.

## 2011-09-11 NOTE — Telephone Encounter (Signed)
Patient needing dissolving tablets.

## 2011-09-15 LAB — HELICOBACTER PYLORI  ANTIBODY, IGM: Helicobacter pylori, IgM: 0.1 U/mL (ref <9.0)

## 2011-09-22 ENCOUNTER — Encounter: Payer: Self-pay | Admitting: Internal Medicine

## 2011-09-29 ENCOUNTER — Telehealth: Payer: Self-pay | Admitting: *Deleted

## 2011-09-29 NOTE — Telephone Encounter (Signed)
Pt left vm req results of ultrasound. Have you seen this? Did you order this?

## 2011-09-29 NOTE — Telephone Encounter (Signed)
I have not seen results of an ultrasound. Was this done at hospital? I cannot get on hospital system right now, but we can call and request.

## 2011-09-30 NOTE — Telephone Encounter (Signed)
Spoke w/patient. She was referred to Dr. Benay Spice who ordered the u/s. I advised her to call their office to get results, also asked that she have them send results to [place] - pt agreed.

## 2011-10-31 ENCOUNTER — Other Ambulatory Visit: Payer: Self-pay | Admitting: *Deleted

## 2011-10-31 DIAGNOSIS — R1011 Right upper quadrant pain: Secondary | ICD-10-CM

## 2011-10-31 MED ORDER — OMEPRAZOLE 40 MG PO CPDR: 40.0000 mg | DELAYED_RELEASE_CAPSULE | Freq: Every day | ORAL | Status: DC

## 2011-11-10 ENCOUNTER — Ambulatory Visit: Payer: BC Managed Care – PPO | Admitting: Internal Medicine

## 2011-11-12 ENCOUNTER — Telehealth: Payer: Self-pay | Admitting: Internal Medicine

## 2011-11-12 NOTE — Telephone Encounter (Signed)
Patient is needing a refill on all her medications before her appt. Can someone call her if there is problem with refilling her medications.

## 2011-11-13 NOTE — Telephone Encounter (Signed)
Fine to refill 

## 2011-11-13 NOTE — Telephone Encounter (Signed)
OK for RF on ALL meds? Next OV is 1/21

## 2011-11-14 NOTE — Telephone Encounter (Signed)
Left mess to call office back w/name of pharm 

## 2011-11-18 NOTE — Telephone Encounter (Signed)
Left message asking patient to return my call with pharmacy

## 2011-11-19 ENCOUNTER — Ambulatory Visit: Payer: BC Managed Care – PPO | Admitting: Internal Medicine

## 2011-11-24 ENCOUNTER — Ambulatory Visit (INDEPENDENT_AMBULATORY_CARE_PROVIDER_SITE_OTHER): Payer: BC Managed Care – PPO | Admitting: Internal Medicine

## 2011-11-24 ENCOUNTER — Encounter: Payer: Self-pay | Admitting: Internal Medicine

## 2011-11-24 ENCOUNTER — Telehealth: Payer: Self-pay | Admitting: Critical Care Medicine

## 2011-11-24 DIAGNOSIS — H6693 Otitis media, unspecified, bilateral: Secondary | ICD-10-CM

## 2011-11-24 DIAGNOSIS — F419 Anxiety disorder, unspecified: Secondary | ICD-10-CM | POA: Insufficient documentation

## 2011-11-24 DIAGNOSIS — H6690 Otitis media, unspecified, unspecified ear: Secondary | ICD-10-CM

## 2011-11-24 DIAGNOSIS — I10 Essential (primary) hypertension: Secondary | ICD-10-CM

## 2011-11-24 DIAGNOSIS — F411 Generalized anxiety disorder: Secondary | ICD-10-CM

## 2011-11-24 DIAGNOSIS — E119 Type 2 diabetes mellitus without complications: Secondary | ICD-10-CM

## 2011-11-24 DIAGNOSIS — J449 Chronic obstructive pulmonary disease, unspecified: Secondary | ICD-10-CM

## 2011-11-24 MED ORDER — FLUTICASONE-SALMETEROL 250-50 MCG/DOSE IN AEPB: 1.0000 | INHALATION_SPRAY | RESPIRATORY_TRACT | Status: DC | PRN

## 2011-11-24 MED ORDER — GLUCOSE BLOOD VI STRP: 1.0000 | ORAL_STRIP | Freq: Two times a day (BID) | Status: DC | PRN

## 2011-11-24 MED ORDER — SAXAGLIPTIN HCL 2.5 MG PO TABS: 2.5000 mg | ORAL_TABLET | Freq: Every day | ORAL | Status: DC

## 2011-11-24 MED ORDER — LEVOFLOXACIN 500 MG PO TABS: 500.0000 mg | ORAL_TABLET | Freq: Every day | ORAL | Status: AC

## 2011-11-24 MED ORDER — VALSARTAN 160 MG PO TABS: 160.0000 mg | ORAL_TABLET | Freq: Every day | ORAL | Status: DC

## 2011-11-24 MED ORDER — ALPRAZOLAM 0.5 MG PO TABS: ORAL_TABLET | ORAL | Status: DC

## 2011-11-24 MED ORDER — TIOTROPIUM BROMIDE MONOHYDRATE 18 MCG IN CAPS: 18.0000 ug | ORAL_CAPSULE | Freq: Every day | RESPIRATORY_TRACT | Status: DC

## 2011-11-24 MED ORDER — SERTRALINE HCL 50 MG PO TABS: 50.0000 mg | ORAL_TABLET | Freq: Every day | ORAL | Status: DC

## 2011-11-24 MED ORDER — METFORMIN HCL 500 MG PO TABS: 500.0000 mg | ORAL_TABLET | Freq: Two times a day (BID) | ORAL | Status: DC

## 2011-11-24 NOTE — Progress Notes (Signed)
Subjective:    Patient ID: Tami Skinner, female    DOB: 1946-04-17, 66 y.o.   MRN: [id number]  HPI 66 year old female with a history of diabetes, COPD, anxiety presents for followup. At her last visit, she complained of severe nausea. Ultimately she underwent GI evaluation including endoscopy which was normal. It was determined that her nausea was secondary to use of Daliresp. She reports that her nausea completely resolved after stopping this medication. She denies any further nausea or abdominal pain since that time.  In regards to her diabetes, she reports that her blood sugars have been well-controlled. She has not recently had a hemoglobin A1c. She denies any blood sugars greater than 250 or less than 80. She did not bring a record of her blood sugars today. She reports full compliance with her medications.  In regards to her COPD, she reports that her symptoms are well-controlled at this time. She has not recently had a COPD exacerbation. She reports full compliance with her Advair and start rehabilitation. She denies any recent increased cough or shortness of breath. She continues to smoke.  In regards to her anxiety, she reports some recent exacerbation as her grandson has been hospitalized at [facility] after her surgical procedure. She reports improvement in her symptoms with the use of sertraline and Xanax as needed.  Outpatient Encounter Prescriptions as of 11/24/2011  Medication Sig Dispense Refill  . albuterol (PROVENTIL HFA;VENTOLIN HFA) 108 (90 BASE) MCG/ACT inhaler Inhale 2 puffs into the lungs every 6 (six) hours as needed.        . ALPRAZolam (XANAX) 0.5 MG tablet 1/2 tablet in am and 1 tablet at bedtime  60 tablet  3  . Fluticasone-Salmeterol (ADVAIR DISKUS) 250-50 MCG/DOSE AEPB Inhale 1 puff into the lungs every 4 (four) hours as needed.  60 each  3  . metFORMIN (GLUCOPHAGE) 500 MG tablet Take 1 tablet (500 mg total) by mouth 2 (two) times daily with a meal.  180 tablet  1  .  OXYGEN-HELIUM IN Inhale 2 % into the lungs at bedtime.        . sertraline (ZOLOFT) 50 MG tablet Take 1 tablet (50 mg total) by mouth daily.  90 tablet  1  . tiotropium (SPIRIVA HANDIHALER) 18 MCG inhalation capsule Place 1 capsule (18 mcg total) into inhaler and inhale daily.  90 capsule  1  . valsartan (DIOVAN) 160 MG tablet Take 1 tablet (160 mg total) by mouth daily.  90 tablet  1  . DISCONTD: ALPRAZolam (XANAX) 0.5 MG tablet Take 0.5 mg by mouth at bedtime as needed. 1/2 tablet in am and 1 tablet at bedtime        . DISCONTD: Fluticasone-Salmeterol (ADVAIR DISKUS) 250-50 MCG/DOSE AEPB Inhale 1 puff into the lungs every 4 (four) hours as needed.        Marland Kitchen DISCONTD: metFORMIN (GLUCOPHAGE) 500 MG tablet Take 500 mg by mouth 2 (two) times daily with a meal.        . DISCONTD: sertraline (ZOLOFT) 50 MG tablet Take 50 mg by mouth daily.        Marland Kitchen DISCONTD: tiotropium (SPIRIVA HANDIHALER) 18 MCG inhalation capsule Place 18 mcg into inhaler and inhale daily.        Marland Kitchen DISCONTD: valsartan (DIOVAN) 160 MG tablet Take 160 mg by mouth daily.        Marland Kitchen glucose blood (ONE TOUCH ULTRA TEST) test strip 1 each by Other route 2 (two) times daily as needed for other. Use  as instructed  100 each  2  . levofloxacin (LEVAQUIN) 500 MG tablet Take 1 tablet (500 mg total) by mouth daily.  7 tablet  0  . omeprazole (PRILOSEC) 40 MG capsule Take 1 capsule (40 mg total) by mouth daily.  90 capsule  1  . saxagliptin HCl (ONGLYZA) 2.5 MG TABS tablet Take 1 tablet (2.5 mg total) by mouth daily.  90 tablet  1    Review of Systems  Constitutional: Negative for fever, chills, appetite change, fatigue and unexpected weight change.  HENT: Positive for ear pain, congestion, postnasal drip and sinus pressure. Negative for sore throat, trouble swallowing, neck pain and voice change.   Eyes: Negative for visual disturbance.  Respiratory: Positive for cough. Negative for chest tightness, shortness of breath, wheezing and stridor.     Cardiovascular: Negative for chest pain, palpitations and leg swelling.  Gastrointestinal: Negative for nausea, vomiting, abdominal pain, diarrhea, constipation, blood in stool, abdominal distention and anal bleeding.  Genitourinary: Negative for dysuria and flank pain.  Musculoskeletal: Negative for myalgias, arthralgias and gait problem.  Skin: Negative for color change and rash.  Neurological: Negative for dizziness and headaches.  Hematological: Negative for adenopathy. Does not bruise/bleed easily.  Psychiatric/Behavioral: Negative for suicidal ideas, sleep disturbance and dysphoric mood. The patient is nervous/anxious.    BP 130/80  Pulse 100  Temp(Src) 97.8 F (36.6 C) (Oral)  Ht 5\' 6"  (1.676 m)  Wt 147 lb (66.679 kg)  BMI 23.73 kg/m2  SpO2 95%     Objective:   Physical Exam  Constitutional: She is oriented to person, place, and time. She appears well-developed and well-nourished. No distress.  HENT:  Head: Normocephalic and atraumatic.  Right Ear: External ear normal. Tympanic membrane is erythematous. A middle ear effusion is present.  Left Ear: External ear normal. Tympanic membrane is erythematous. A middle ear effusion is present.  Nose: Nose normal.  Mouth/Throat: Oropharynx is clear and moist. No oropharyngeal exudate.  Eyes: Conjunctivae are normal. Pupils are equal, round, and reactive to light. Right eye exhibits no discharge. Left eye exhibits no discharge. No scleral icterus.  Neck: Normal range of motion. Neck supple. No tracheal deviation present. No thyromegaly present.  Cardiovascular: Normal rate, regular rhythm, normal heart sounds and intact distal pulses.  Exam reveals no gallop and no friction rub.   No murmur heard. Pulmonary/Chest: Effort normal and breath sounds normal. No accessory muscle usage. Not tachypneic. No respiratory distress. She has no decreased breath sounds. She has no wheezes. She has no rales. She exhibits no tenderness.   Musculoskeletal: Normal range of motion. She exhibits no edema and no tenderness.  Lymphadenopathy:    She has no cervical adenopathy.  Neurological: She is alert and oriented to person, place, and time. No cranial nerve deficit. She exhibits normal muscle tone. Coordination normal.  Skin: Skin is warm and dry. No rash noted. She is not diaphoretic. No erythema. No pallor.  Psychiatric: She has a normal mood and affect. Her behavior is normal. Judgment and thought content normal.          Assessment & Plan:

## 2011-11-24 NOTE — Telephone Encounter (Signed)
I called to speak with Marj in [facility] office to let her know about our changing MD protocol; she was not there but left message via Myriam Jacobson to inform her that [facility] will have to give the approval to change MDs. Will let patient know as soon as possible.

## 2011-11-24 NOTE — Telephone Encounter (Signed)
i have absolutely no problem with this,  She will like dr Kendrick Fries

## 2011-11-24 NOTE — Assessment & Plan Note (Signed)
Pt reports good control of BG. Will check A1c with labs today. Goal A1c 7%. Follow up 3 months.

## 2011-11-24 NOTE — Assessment & Plan Note (Addendum)
Pt previous physician had started Daliresp in effort to limit COPD exacerbations, however pt developed severe nausea with this. Symptoms are currently well controlled. She would like to establish care with new pulmonologist, so will set up with Dr. Kendrick Fries.  Continue Advair, Spiriva, and prn albuterol. Follow up 3months.

## 2011-11-24 NOTE — Telephone Encounter (Signed)
Spoke w/pt at OV today, RF's were not needed prior to apt

## 2011-11-24 NOTE — Assessment & Plan Note (Signed)
Will treat with Levaquin. Follow up if ear pain not improving over next 72 hr or if fever >101F occurs.

## 2011-11-24 NOTE — Assessment & Plan Note (Signed)
Symptoms recently exacerbated by stress related to grandson's medical issues. Offered support today. Will continue Sertraline and Xanax prn. Follow up 3 months.

## 2011-11-24 NOTE — Telephone Encounter (Signed)
LMTCB for the pt 

## 2011-11-25 LAB — HEMOGLOBIN A1C: Mean Plasma Glucose: 128 mg/dL — ABNORMAL HIGH (ref <117)

## 2011-11-25 NOTE — Telephone Encounter (Signed)
LMOMTCb for pt on home and mobile number. Marjorie aware we will contact the pt to schedule this appt.

## 2011-11-25 NOTE — Telephone Encounter (Signed)
Pt returned call and Tami Skinner is scheduling this appt for her in [place] with Dr. Kendrick Fries.

## 2011-11-25 NOTE — Telephone Encounter (Signed)
Pt returned call. Please call her at her work # now since she's not allowed to answer cell- [phone]. Me

## 2011-11-25 NOTE — Telephone Encounter (Signed)
lmtcb for pt.  

## 2011-11-26 LAB — COMPREHENSIVE METABOLIC PANEL

## 2011-11-26 NOTE — Progress Notes (Signed)
Nope, still doesn't work.

## 2011-12-04 ENCOUNTER — Telehealth: Payer: Self-pay | Admitting: *Deleted

## 2011-12-04 DIAGNOSIS — Z1239 Encounter for other screening for malignant neoplasm of breast: Secondary | ICD-10-CM

## 2011-12-04 NOTE — Telephone Encounter (Signed)
Lets bring her back to repeat.

## 2011-12-04 NOTE — Telephone Encounter (Signed)
Called patient, left message asking patient to return my call .

## 2011-12-09 ENCOUNTER — Ambulatory Visit (INDEPENDENT_AMBULATORY_CARE_PROVIDER_SITE_OTHER): Payer: BC Managed Care – PPO | Admitting: Pulmonary Disease

## 2011-12-09 ENCOUNTER — Ambulatory Visit (INDEPENDENT_AMBULATORY_CARE_PROVIDER_SITE_OTHER)
Admission: RE | Admit: 2011-12-09 | Discharge: 2011-12-09 | Disposition: A | Discharge status: Home / Self Care | Payer: BC Managed Care – PPO | Source: Ambulatory Visit | Attending: Pulmonary Disease | Admitting: Pulmonary Disease

## 2011-12-09 ENCOUNTER — Encounter: Payer: Self-pay | Admitting: Pulmonary Disease

## 2011-12-09 ENCOUNTER — Telehealth: Payer: Self-pay | Admitting: Pulmonary Disease

## 2011-12-09 DIAGNOSIS — J449 Chronic obstructive pulmonary disease, unspecified: Secondary | ICD-10-CM

## 2011-12-09 DIAGNOSIS — G4730 Sleep apnea, unspecified: Secondary | ICD-10-CM

## 2011-12-09 DIAGNOSIS — R05 Cough: Secondary | ICD-10-CM

## 2011-12-09 DIAGNOSIS — R059 Cough, unspecified: Secondary | ICD-10-CM

## 2011-12-09 DIAGNOSIS — R0989 Other specified symptoms and signs involving the circulatory and respiratory systems: Secondary | ICD-10-CM

## 2011-12-09 DIAGNOSIS — R0600 Dyspnea, unspecified: Secondary | ICD-10-CM

## 2011-12-09 DIAGNOSIS — R0609 Other forms of dyspnea: Secondary | ICD-10-CM

## 2011-12-09 NOTE — Telephone Encounter (Signed)
lmomtcb x1--looks like [facility] called and left a message for her not [place].

## 2011-12-09 NOTE — Assessment & Plan Note (Signed)
She notes that she still uses her CPAP machine regularly but she has been feeling more fatigued and sleepy during the day.  It has been 9 years since her last CPAP titration study so we will send her for a repeat study here in [place].

## 2011-12-09 NOTE — Patient Instructions (Signed)
Use Neti-pot rinses with distilled water at least twice per day using the instructions on the package. Ask the pharmacist to help you find: chlortrimeton and pseudaphed and guaifenesin to use as needed for your congestion and scratchy throat. If you are not better by the weekend, fill the Rx for the prednisone and the Levaquin and take as directed. We will send you for a CXR today due to your cough and shortness of breath.  Return to clinic in 4-6 weeks.

## 2011-12-09 NOTE — Progress Notes (Signed)
Subjective:    Patient ID: Tami Skinner, female    DOB: [birth year], 66 y.o.   MRN: [id number]  HPI 66 y/o female comes to our clinic to re-establish outpatient pulmonary care with [place] here at the [facility] pulmonary clinic.  She has previously been under the care of Dr. Delford Skinner in the pulmonary office in [place].   She states that typically she has one exacerbation per year requiring steroids.  She has been placed on roflumilast recently, but states that it made her stomach quite upset thus requiring its discontinuation. In the last few weeks she has been struggling with URI symptoms including sinus congestion, headache, earache, stuffiness in her ears. She was treated with levaquin several weeks ago for otitis media.  She notes that the sinus congestion and sore throat persist and she is developing a cough. She makes sputum daily, but she has had an increase in the amount lately.  Past Medical History  Diagnosis Date  . COPD (chronic obstructive pulmonary disease)   . Diverticulitis   . Diabetes mellitus   . S/P colonoscopy 2010    Dr. Servando Skinner  . Hypotension   . Strep throat   . Diarrhea   . Weakness   . Bronchitis, acute, with bronchospasm   . Asthmatic bronchitis   . Constipation, chronic   . Sleep apnea   . Facial pain   . Fibrocystic breast disease   . Neck pain   . Anxiety   . Osteopenia   . Hyperlipidemia   . Diverticulosis      Family History  Problem Relation Age of Onset  . Diabetes Daughter   . Kidney disease Daughter   . Cancer Maternal Aunt     breast  . Diabetes Maternal Grandmother   . Cancer Cousin     colon  . Lung cancer Mother     smoked  . COPD Father     smoked  . COPD Mother     smoked     History   Social History  . Marital Status: Married    Spouse Name: N/A    Number of Children: N/A  . Years of Education: N/A   Occupational History  . Not on file.   Social History Main Topics  . Smoking status: Current Everyday Smoker -- 0.5 packs/day  for 40 years    Types: Cigarettes  . Smokeless tobacco: Never Used  . Alcohol Use: No  . Drug Use: No  . Sexually Active: Not on file   Other Topics Concern  . Not on file   Social History Narrative   Lives with husband in [place]. 2 dogs. Works at [employer].     Allergies  Allergen Reactions  . Daliresp (Roflumilast)     Severe nausea   . Erythromycin Base     REACTION: GI upset  . WUJ:[id number]+AOZHYQMVH+QIONGEXBMW Acid+Aspartame     REACTION: stomach sensitive.     Outpatient Prescriptions Prior to Visit  Medication Sig Dispense Refill  . albuterol (PROVENTIL HFA;VENTOLIN HFA) 108 (90 BASE) MCG/ACT inhaler Inhale 2 puffs into the lungs every 6 (six) hours as needed.        . ALPRAZolam (XANAX) 0.5 MG tablet 1/2 tablet in am and 1 tablet at bedtime  60 tablet  3  . glucose blood (ONE TOUCH ULTRA TEST) test strip 1 each by Other route 2 (two) times daily as needed for other. Use as instructed  100 each  2  . metFORMIN (GLUCOPHAGE) 500 MG tablet Take  1 tablet (500 mg total) by mouth 2 (two) times daily with a meal.  180 tablet  1  . OXYGEN-HELIUM IN Inhale 2 % into the lungs at bedtime.        . saxagliptin HCl (ONGLYZA) 2.5 MG TABS tablet Take 1 tablet (2.5 mg total) by mouth daily.  90 tablet  1  . sertraline (ZOLOFT) 50 MG tablet Take 1 tablet (50 mg total) by mouth daily.  90 tablet  1  . tiotropium (SPIRIVA HANDIHALER) 18 MCG inhalation capsule Place 1 capsule (18 mcg total) into inhaler and inhale daily.  90 capsule  1  . valsartan (DIOVAN) 160 MG tablet Take 1 tablet (160 mg total) by mouth daily.  90 tablet  1  . Fluticasone-Salmeterol (ADVAIR DISKUS) 250-50 MCG/DOSE AEPB Inhale 1 puff into the lungs every 4 (four) hours as needed.  60 each  3  . omeprazole (PRILOSEC) 40 MG capsule Take 1 capsule (40 mg total) by mouth daily.  90 capsule  1    Review of Systems  Constitutional: Negative for fever, chills and unexpected weight change.  HENT: Positive for congestion, sore  throat, rhinorrhea, postnasal drip and sinus pressure. Negative for ear pain, nosebleeds, sneezing, trouble swallowing, dental problem and voice change.   Eyes: Negative for visual disturbance.  Respiratory: Positive for cough. Negative for choking and shortness of breath.   Cardiovascular: Negative for chest pain and leg swelling.  Gastrointestinal: Negative for vomiting, abdominal pain and diarrhea.  Genitourinary: Negative for difficulty urinating.  Musculoskeletal: Negative for arthralgias.  Skin: Negative for rash.  Neurological: Negative for tremors, syncope and headaches.  Hematological: Does not bruise/bleed easily.       Objective:   Physical Exam Filed Vitals:   12/09/11 1441  BP: 142/86  Pulse: 84  Temp: 97.6 F (36.4 C)  TempSrc: Oral  Height: 5\' 6"  (1.676 m)  Weight: 147 lb (66.679 kg)  SpO2: 100%   Gen: well appearing, no acute distress HEENT: NCAT, PERRL, EOMi, OP clear, TM's with noticeable fluid, but no bulge or decreased light reflex Neck: supple without masses PULM: Few insp crackles bases, otherwise clear CV: RRR, no mgr, no JVD AB: BS+, soft, nontender, no hsm Ext: warm, no edema, no clubbing, no cyanosis Derm: no rash or skin breakdown Neuro: A&Ox4, CN II-XII intact, strength 5/5 in all 4 extremities   2007 PFT: FEV1 2.96 90% TLC 108% DLCO 62% 2007     Assessment & Plan:   SLEEP APNEA She notes that she still uses her CPAP machine regularly but she has been feeling more fatigued and sleepy during the day.  It has been 9 years since her last CPAP titration study so we will send her for a repeat study here in [place].  COPD (chronic obstructive pulmonary disease) GOLD stage II based on 2007 PFT: -FeV1 2.96 90% TLC 108% DLCO 62% 2007   Ms. Tami Skinner has been struggling with an upper respiratory infection in the last few weeks and recently completed a course of Levaquin for otitis media  She is still having quite a lot of upper respiratory  symptoms including sinus congestion and cough.  While she is not quite in an exacerbation now, she is developing a cough which is worrisome.  Plan: -will treat sinus congestion more aggressively with Neti Pot rinses, chlortrimeton/decongestants -I have given her a script for Levaquin and a prednisone taper that she is to take if she is not improved by the end of the week  Updated Medication List Outpatient Encounter Prescriptions as of 12/09/2011  Medication Sig Dispense Refill  . albuterol (PROVENTIL HFA;VENTOLIN HFA) 108 (90 BASE) MCG/ACT inhaler Inhale 2 puffs into the lungs every 6 (six) hours as needed.        . ALPRAZolam (XANAX) 0.5 MG tablet 1/2 tablet in am and 1 tablet at bedtime  60 tablet  3  . Fluticasone-Salmeterol (ADVAIR) 250-50 MCG/DOSE AEPB Inhale 1 puff into the lungs 2 (two) times daily.      Marland Kitchen glucose blood (ONE TOUCH ULTRA TEST) test strip 1 each by Other route 2 (two) times daily as needed for other. Use as instructed  100 each  2  . metFORMIN (GLUCOPHAGE) 500 MG tablet Take 1 tablet (500 mg total) by mouth 2 (two) times daily with a meal.  180 tablet  1  . OXYGEN-HELIUM IN Inhale 2 % into the lungs at bedtime.        . saxagliptin HCl (ONGLYZA) 2.5 MG TABS tablet Take 1 tablet (2.5 mg total) by mouth daily.  90 tablet  1  . sertraline (ZOLOFT) 50 MG tablet Take 1 tablet (50 mg total) by mouth daily.  90 tablet  1  . tiotropium (SPIRIVA HANDIHALER) 18 MCG inhalation capsule Place 1 capsule (18 mcg total) into inhaler and inhale daily.  90 capsule  1  . valsartan (DIOVAN) 160 MG tablet Take 1 tablet (160 mg total) by mouth daily.  90 tablet  1  . DISCONTD: Fluticasone-Salmeterol (ADVAIR DISKUS) 250-50 MCG/DOSE AEPB Inhale 1 puff into the lungs every 4 (four) hours as needed.  60 each  3  . DISCONTD: omeprazole (PRILOSEC) 40 MG capsule Take 1 capsule (40 mg total) by mouth daily.  90 capsule  1

## 2011-12-09 NOTE — Telephone Encounter (Signed)
I spoke with pt and advised her of this. She voiced her understanding and needed nothing further 

## 2011-12-09 NOTE — Assessment & Plan Note (Addendum)
GOLD stage II based on 2007 PFT: -FeV1 2.96 90% TLC 108% DLCO 62% 2007   Tami Skinner has been struggling with an upper respiratory infection in the last few weeks and recently completed a course of Levaquin for otitis media  She is still having quite a lot of upper respiratory symptoms including sinus congestion and cough.  While she is not quite in an exacerbation now, she is developing a cough which is worrisome.  Plan: -will treat sinus congestion more aggressively with Neti Pot rinses, chlortrimeton/decongestants -I have given her a script for Levaquin and a prednisone taper that she is to take if she is not improved by the end of the week

## 2011-12-09 NOTE — Telephone Encounter (Signed)
Patient is seeing Dr. Kendrick Fries today and will have her labs repeated when she come in.

## 2011-12-09 NOTE — Telephone Encounter (Signed)
Returning call can be reached at [phone].Tami Skinner

## 2011-12-10 ENCOUNTER — Telehealth: Payer: Self-pay | Admitting: *Deleted

## 2011-12-10 ENCOUNTER — Encounter: Payer: Self-pay | Admitting: Internal Medicine

## 2011-12-10 NOTE — Telephone Encounter (Signed)
Message copied by Christen Butter on Wed Dec 10, 2011  1:06 PM ------      Message from: Veto Kemps B      Created: Tue Dec 09, 2011 10:12 PM       L,            Please let her know her CXR is normal.            Thanks,      B

## 2011-12-10 NOTE — Telephone Encounter (Signed)
Spoke with pt and notified of results per Dr.McQuaid. Pt verbalized understanding and denied any questions. 

## 2011-12-28 ENCOUNTER — Ambulatory Visit: Payer: Self-pay | Admitting: Pulmonary Disease

## 2012-01-05 ENCOUNTER — Telehealth: Payer: Self-pay | Admitting: *Deleted

## 2012-01-05 NOTE — Telephone Encounter (Signed)
LMTCB to inform her of results and ensure [facility] is still DME she wants to use

## 2012-01-05 NOTE — Telephone Encounter (Signed)
Message copied by Christen Butter on Mon Jan 05, 2012  1:40 PM ------      Message from: Veto Kemps B      Created: Mon Jan 05, 2012  1:31 PM       L,            Can you call Ms. Haverstock to let her know that we got her sleep study back?            She needs to have her CPAP set on 14cm H20.  If she needs a new prescription I am happy to send it to her.            Thanks,      B

## 2012-01-06 NOTE — Telephone Encounter (Signed)
LMTCB x2  

## 2012-01-07 NOTE — Telephone Encounter (Signed)
LMTCB x 3 

## 2012-01-08 ENCOUNTER — Encounter: Payer: Self-pay | Admitting: Pulmonary Disease

## 2012-01-08 ENCOUNTER — Ambulatory Visit: Payer: BC Managed Care – PPO | Admitting: Pulmonary Disease

## 2012-01-09 ENCOUNTER — Telehealth: Payer: Self-pay | Admitting: Pulmonary Disease

## 2012-01-09 DIAGNOSIS — G4733 Obstructive sleep apnea (adult) (pediatric): Secondary | ICD-10-CM

## 2012-01-09 NOTE — Telephone Encounter (Signed)
Done- see 01/09/12 phone note

## 2012-01-09 NOTE — Telephone Encounter (Signed)
Called pt aware of need to change CPAP setting. Order sent to [facility] for [facility]

## 2012-01-16 ENCOUNTER — Telehealth: Payer: Self-pay | Admitting: Pulmonary Disease

## 2012-01-16 DIAGNOSIS — G4730 Sleep apnea, unspecified: Secondary | ICD-10-CM

## 2012-01-16 NOTE — Telephone Encounter (Signed)
Spoke with pt. She states that she feels like CPAP pressure set on 14 may be too much for her. She states wakes up in the night and takes the mask off because it makes her have to belch and "mask is going to blow off my face". She states [facility] is rec maybe decreasing the pressure to 12 to see if this helps. Dr. Kendrick Fries, please advise, thanks!

## 2012-01-19 NOTE — Telephone Encounter (Signed)
OK, trying 12 is OK.  She shouldn't go lower than that until she can see me again.

## 2012-01-19 NOTE — Telephone Encounter (Signed)
Called and spoke with pt.  Informed her of Dr. Ulyses Jarred response and order sent to [facility] to decrease pressure to 12 cm.

## 2012-02-03 ENCOUNTER — Encounter: Payer: Self-pay | Admitting: Pulmonary Disease

## 2012-02-03 ENCOUNTER — Ambulatory Visit (INDEPENDENT_AMBULATORY_CARE_PROVIDER_SITE_OTHER): Payer: BC Managed Care – PPO | Admitting: Pulmonary Disease

## 2012-02-03 VITALS — BP 122/80 | HR 91 | Temp 97.9°F | Ht 66.0 in | Wt 152.0 lb

## 2012-02-03 DIAGNOSIS — F17200 Nicotine dependence, unspecified, uncomplicated: Secondary | ICD-10-CM

## 2012-02-03 DIAGNOSIS — H6690 Otitis media, unspecified, unspecified ear: Secondary | ICD-10-CM

## 2012-02-03 DIAGNOSIS — J449 Chronic obstructive pulmonary disease, unspecified: Secondary | ICD-10-CM

## 2012-02-03 DIAGNOSIS — R0989 Other specified symptoms and signs involving the circulatory and respiratory systems: Secondary | ICD-10-CM

## 2012-02-03 DIAGNOSIS — H6693 Otitis media, unspecified, bilateral: Secondary | ICD-10-CM

## 2012-02-03 DIAGNOSIS — R0600 Dyspnea, unspecified: Secondary | ICD-10-CM

## 2012-02-03 MED ORDER — ALBUTEROL SULFATE HFA 108 (90 BASE) MCG/ACT IN AERS: 2.0000 | INHALATION_SPRAY | RESPIRATORY_TRACT | Status: DC | PRN

## 2012-02-03 MED ORDER — FLUTICASONE PROPIONATE 50 MCG/ACT NA SUSP: 2.0000 | Freq: Every day | NASAL | Status: DC

## 2012-02-03 MED ORDER — VARENICLINE TARTRATE 0.5 MG X 11 & 1 MG X 42 PO MISC: ORAL | Status: DC

## 2012-02-03 NOTE — Assessment & Plan Note (Signed)
Discussed at length today.  Causing her lung function to decline faster than expected for her age.  She is willing to start Chantix and understands that it could make her anxiety worse.  Considering the circumstances I think that anything to get her to quit smoking would be helpful. She has failed the patch in the past.  Plan: -start Chantix starter pack, call us if anxiety worse

## 2012-02-03 NOTE — Progress Notes (Signed)
Subjective:    Patient ID: Tami Skinner, female    DOB: Dec 20, 1945, 66 y.o.   MRN: [id number]  Synopsis: 66 y/o female with GOLD stage II COPD initially evaluated by Dr. Delford Field in [place] transferred care of the same to [facility] [place] in 12/2011. Still actively smoking at that time.  Most significant symptom is dyspnea.  HPI 02/03/2012 ROV-- Tami Skinner returns stating that in general she feels better since her last visit on 12/09/11 when she had an URI.  She still feels that her ear is "full of fluid" but it has slowly improved.  She is still actively smoking.  She has noted increasing dyspea over the last few years, and now does not think that she could walk 100 yards without stopping.  She does not struggle with cough.  She states that if she takes the advair and spiriva she feels better, rarely uses albuterol.  She doesn't think the proventil helps as much as prior preparations of albuterol.   Review of Systems  Constitutional: Negative for fever, fatigue and unexpected weight change.  HENT: Positive for hearing loss, ear pain, congestion, rhinorrhea and postnasal drip. Negative for ear discharge.   Respiratory: Positive for shortness of breath. Negative for cough, choking and chest tightness.   Cardiovascular: Negative for chest pain, palpitations and leg swelling.       Objective:   Physical Exam  Filed Vitals:   02/03/12 0958  BP: 122/80  Pulse: 91  Temp: 97.9 F (36.6 C)  TempSrc: Oral  Height: 5\' 6"  (1.676 m)  Weight: 152 lb (68.947 kg)  SpO2: 95%   Gen: well appearing, no acute distress HEENT: NCAT, PERRL, EOMi, OP clear, neck supple without masses, left TM with persistent effusion but less swollen and no erythema on today's exam PULM: diminished, very poor air movement CV: RRR, no mgr, no JVD AB: BS+, soft, nontender, no hsm Ext: warm, no edema, no clubbing, no cyanosis Derm: no rash or skin breakdown Neuro: A&Ox4, CN II-XII intact, strength 5/5 in all 4  extremities  02/04/12 simple spirometry: F/F ratio 49%, FEV1 1.21 L (49%)     Assessment & Plan:   COPD (chronic obstructive pulmonary disease) GOLD stage III by today's spirometry and MMRC grade 3 dyspnea, so quite severe disease.  She is still smoking which does not help.  She is also deconditioned as well.  She doesn't think that the ventolin she has had home works as well has her prior prescriptions for albuterol.  Plan: -repeat spirometry today -advised at length to quit smoking today -continue advair/spiriva -increase exercise  -change ventolin to proAir because she thinks Ventolin doesn't work as well.  Otitis media of both ears Slowly improving but still has serous effusion in L ear.    Plan: -start fluticasone nasal sprays and neti pot to hopefully improve drainage -if no improvement in 3-4 weeks, refer to ENT  TOBACCO ABUSE Discussed at length today.  Causing her lung function to decline faster than expected for her age.  She is willing to start Chantix and understands that it could make her anxiety worse.  Considering the circumstances I think that anything to get her to quit smoking would be helpful. She has failed the patch in the past.  Plan: -start Chantix starter pack, call us if anxiety worse     Updated Medication List Outpatient Encounter Prescriptions as of 02/03/2012  Medication Sig Dispense Refill  . ALPRAZolam (XANAX) 0.5 MG tablet 1/2 tablet in am and 1  tablet at bedtime  60 tablet  3  . Fluticasone-Salmeterol (ADVAIR) 250-50 MCG/DOSE AEPB Inhale 1 puff into the lungs 2 (two) times daily.      Marland Kitchen glucose blood (ONE TOUCH ULTRA TEST) test strip 1 each by Other route 2 (two) times daily as needed for other. Use as instructed  100 each  2  . metFORMIN (GLUCOPHAGE) 500 MG tablet Take 1 tablet (500 mg total) by mouth 2 (two) times daily with a meal.  180 tablet  1  . OXYGEN-HELIUM IN Inhale 2 % into the lungs at bedtime.        . saxagliptin HCl (ONGLYZA) 2.5 MG  TABS tablet Take 1 tablet (2.5 mg total) by mouth daily.  90 tablet  1  . sertraline (ZOLOFT) 50 MG tablet Take 1 tablet (50 mg total) by mouth daily.  90 tablet  1  . tiotropium (SPIRIVA HANDIHALER) 18 MCG inhalation capsule Place 1 capsule (18 mcg total) into inhaler and inhale daily.  90 capsule  1  . valsartan (DIOVAN) 160 MG tablet Take 1 tablet (160 mg total) by mouth daily.  90 tablet  1  . DISCONTD: albuterol (PROVENTIL HFA;VENTOLIN HFA) 108 (90 BASE) MCG/ACT inhaler Inhale 2 puffs into the lungs every 6 (six) hours as needed.        Marland Kitchen albuterol (PROAIR HFA) 108 (90 BASE) MCG/ACT inhaler Inhale 2 puffs into the lungs every 4 (four) hours as needed for wheezing.  1 Inhaler  2  . fluticasone (FLONASE) 50 MCG/ACT nasal spray Place 2 sprays into the nose daily.  1 g  0  . varenicline (CHANTIX STARTING MONTH PAK) 0.5 MG X 11 & 1 MG X 42 tablet Take one 0.5 mg tablet by mouth once daily for 3 days, then increase to one 0.5 mg tablet twice daily for 4 days, then increase to one 1 mg tablet twice daily.  53 tablet  0

## 2012-02-03 NOTE — Assessment & Plan Note (Addendum)
GOLD stage III by today's spirometry and MMRC grade 3 dyspnea, so quite severe disease.  She is still smoking which does not help.  She is also deconditioned as well.  She doesn't think that the ventolin she has had home works as well has her prior prescriptions for albuterol.  Plan: -repeat spirometry today -advised at length to quit smoking today -continue advair/spiriva -increase exercise  -change ventolin to proAir because she thinks Ventolin doesn't work as well.

## 2012-02-03 NOTE — Assessment & Plan Note (Addendum)
Slowly improving but still has serous effusion in L ear.    Plan: -start fluticasone nasal sprays and neti pot to hopefully improve drainage -if no improvement in 3-4 weeks, refer to ENT

## 2012-02-03 NOTE — Patient Instructions (Signed)
Start using the Chantix starter pack as directed.  Let us know if you think that your anxiety is worse while taking it. Start using the neti pot again. Use the fluticasone nasal spray 2 puffs in each nostril daily. Let us know if the fluid in your ear has not improved in 2-3 weeks.  If it has not we will send you to see an ENT physician. Stop smoking. Exercise more.  Start slow, 5 minutes a day and increase slowly each week.  Eventually your goal exercise should be 30 minutes daily. We will see you back in 3 months, sooner if needed.

## 2012-02-09 ENCOUNTER — Telehealth: Payer: Self-pay | Admitting: Pulmonary Disease

## 2012-02-09 MED ORDER — VARENICLINE TARTRATE 0.5 MG X 11 & 1 MG X 42 PO MISC: ORAL | Status: AC

## 2012-02-09 NOTE — Telephone Encounter (Signed)
Spoke with patient-looks at though BQ printed and gave RX to patient at her visit; pt stated she did not recall seeing a RX given to her. I will call Rx to [facility] at patients request for her.

## 2012-02-26 ENCOUNTER — Encounter: Payer: Self-pay | Admitting: Internal Medicine

## 2012-02-26 ENCOUNTER — Ambulatory Visit (INDEPENDENT_AMBULATORY_CARE_PROVIDER_SITE_OTHER): Payer: BC Managed Care – PPO | Admitting: Internal Medicine

## 2012-02-26 DIAGNOSIS — H6690 Otitis media, unspecified, unspecified ear: Secondary | ICD-10-CM

## 2012-02-26 DIAGNOSIS — Z720 Tobacco use: Secondary | ICD-10-CM

## 2012-02-26 DIAGNOSIS — E119 Type 2 diabetes mellitus without complications: Secondary | ICD-10-CM

## 2012-02-26 DIAGNOSIS — H6693 Otitis media, unspecified, bilateral: Secondary | ICD-10-CM

## 2012-02-26 DIAGNOSIS — F17200 Nicotine dependence, unspecified, uncomplicated: Secondary | ICD-10-CM

## 2012-02-26 LAB — COMPREHENSIVE METABOLIC PANEL
ALT: 14 U/L (ref 0–35)
Albumin: 3.9 g/dL (ref 3.5–5.2)
CO2: 27 mEq/L (ref 19–32)
GFR: 98.54 mL/min (ref >60.00)
Glucose, Bld: 121 mg/dL — ABNORMAL HIGH (ref 70–99)
Potassium: 4.7 mEq/L (ref 3.5–5.1)
Sodium: 138 mEq/L (ref 135–145)
Total Protein: 6.6 g/dL (ref 6.0–8.3)

## 2012-02-26 MED ORDER — CEPHALEXIN 500 MG PO CAPS: 500.0000 mg | ORAL_CAPSULE | Freq: Three times a day (TID) | ORAL | Status: AC

## 2012-02-26 NOTE — Assessment & Plan Note (Signed)
Congratulated pt on smoking cessation and encouraged her to continue with Chantix.

## 2012-02-26 NOTE — Assessment & Plan Note (Signed)
Blood sugars very well controlled. Last A1c 6.1%.  Will recheck A1c today. Continue current meds. Optho UTD 2013. Follow up 3 months.

## 2012-02-26 NOTE — Progress Notes (Signed)
Subjective:    Patient ID: Tami Skinner, female    DOB: 04/12/1946, 66 y.o.   MRN: [id number]  HPI 66 year old female with history of diabetes, tobacco abuse, hypertension presents for followup. In regards to her diabetes, she reports full compliance with her medications. She did not bring a record of her blood sugars today. Last hemoglobin A1c was 6.1%.  In regards to her history of tobacco use, she notes that she recently started Chantix and has not been smoking over the last couple of weeks. She denies any significant side effects from this medication.  She is concerned today about some right ear pain. She also notes mild nasal congestion. She denies fever or chills.  Outpatient Encounter Prescriptions as of 02/26/2012  Medication Sig Dispense Refill  . albuterol (PROAIR HFA) 108 (90 BASE) MCG/ACT inhaler Inhale 2 puffs into the lungs every 4 (four) hours as needed for wheezing.  1 Inhaler  2  . ALPRAZolam (XANAX) 0.5 MG tablet 1/2 tablet in am and 1 tablet at bedtime  60 tablet  3  . cetirizine (ZYRTEC) 10 MG tablet Take 10 mg by mouth daily.      . fluticasone (FLONASE) 50 MCG/ACT nasal spray Place 2 sprays into the nose daily.  1 g  0  . Fluticasone-Salmeterol (ADVAIR) 250-50 MCG/DOSE AEPB Inhale 1 puff into the lungs 2 (two) times daily.      Marland Kitchen glucose blood (ONE TOUCH ULTRA TEST) test strip 1 each by Other route 2 (two) times daily as needed for other. Use as instructed  100 each  2  . metFORMIN (GLUCOPHAGE) 500 MG tablet Take 1 tablet (500 mg total) by mouth 2 (two) times daily with a meal.  180 tablet  1  . OXYGEN-HELIUM IN Inhale 2 % into the lungs at bedtime.        . saxagliptin HCl (ONGLYZA) 2.5 MG TABS tablet Take 1 tablet (2.5 mg total) by mouth daily.  90 tablet  1  . sertraline (ZOLOFT) 50 MG tablet Take 1 tablet (50 mg total) by mouth daily.  90 tablet  1  . tiotropium (SPIRIVA HANDIHALER) 18 MCG inhalation capsule Place 1 capsule (18 mcg total) into inhaler and inhale  daily.  90 capsule  1  . valsartan (DIOVAN) 160 MG tablet Take 1 tablet (160 mg total) by mouth daily.  90 tablet  1  . varenicline (CHANTIX STARTING MONTH PAK) 0.5 MG X 11 & 1 MG X 42 tablet Take one 0.5 mg tablet by mouth once daily for 3 days, then increase to one 0.5 mg tablet twice daily for 4 days, then increase to one 1 mg tablet twice daily.  53 tablet  0  . cephALEXin (KEFLEX) 500 MG capsule Take 1 capsule (500 mg total) by mouth 3 (three) times daily.  30 capsule  0    Review of Systems  Constitutional: Negative for fever, chills, appetite change, fatigue and unexpected weight change.  HENT: Positive for ear pain and congestion. Negative for sore throat, trouble swallowing, neck pain, voice change and sinus pressure.   Eyes: Negative for visual disturbance.  Respiratory: Negative for cough, shortness of breath, wheezing and stridor.   Cardiovascular: Negative for chest pain, palpitations and leg swelling.  Gastrointestinal: Negative for nausea, vomiting, abdominal pain, diarrhea, constipation, blood in stool, abdominal distention and anal bleeding.  Genitourinary: Negative for dysuria and flank pain.  Musculoskeletal: Negative for myalgias, arthralgias and gait problem.  Skin: Negative for color change and rash.  Neurological: Negative for dizziness and headaches.  Hematological: Negative for adenopathy. Does not bruise/bleed easily.  Psychiatric/Behavioral: Negative for suicidal ideas, sleep disturbance and dysphoric mood. The patient is not nervous/anxious.        Objective:   Physical Exam  Constitutional: She is oriented to person, place, and time. She appears well-developed and well-nourished. No distress.  HENT:  Head: Normocephalic and atraumatic.  Right Ear: External ear normal. Tympanic membrane is injected, erythematous and bulging. Tympanic membrane is not retracted. A middle ear effusion is present.  Left Ear: External ear normal. Tympanic membrane is injected,  erythematous and bulging. Tympanic membrane is not retracted. A middle ear effusion is present.  Nose: Nose normal.  Mouth/Throat: Oropharynx is clear and moist. No oropharyngeal exudate.  Eyes: Conjunctivae are normal. Pupils are equal, round, and reactive to light. Right eye exhibits no discharge. Left eye exhibits no discharge. No scleral icterus.  Neck: Normal range of motion. Neck supple. No tracheal deviation present. No thyromegaly present.  Cardiovascular: Normal rate, regular rhythm, normal heart sounds and intact distal pulses.  Exam reveals no gallop and no friction rub.   No murmur heard. Pulmonary/Chest: Effort normal and breath sounds normal. No respiratory distress. She has no wheezes. She has no rales. She exhibits no tenderness.  Musculoskeletal: Normal range of motion. She exhibits no edema and no tenderness.  Lymphadenopathy:    She has no cervical adenopathy.  Neurological: She is alert and oriented to person, place, and time. No cranial nerve deficit. She exhibits normal muscle tone. Coordination normal.  Skin: Skin is warm and dry. No rash noted. She is not diaphoretic. No erythema. No pallor.  Psychiatric: She has a normal mood and affect. Her behavior is normal. Judgment and thought content normal.          Assessment & Plan:

## 2012-02-26 NOTE — Assessment & Plan Note (Signed)
Exam consistent with bilateral OM. Will treat with Keflex x 10 days. If no improvement, pt will email or call.

## 2012-02-27 ENCOUNTER — Encounter: Payer: Self-pay | Admitting: Internal Medicine

## 2012-02-27 LAB — HEMOGLOBIN A1C
Hgb A1c MFr Bld: 6.1 % — ABNORMAL HIGH (ref <5.7)
Mean Plasma Glucose: 128 mg/dL — ABNORMAL HIGH (ref <117)

## 2012-03-01 ENCOUNTER — Telehealth: Payer: Self-pay | Admitting: Internal Medicine

## 2012-03-01 NOTE — Telephone Encounter (Signed)
Patient notified of her appointment with [facility] on June 3,2013 @ 8:30 am.

## 2012-03-06 ENCOUNTER — Other Ambulatory Visit: Payer: Self-pay | Admitting: Internal Medicine

## 2012-03-10 ENCOUNTER — Other Ambulatory Visit: Payer: Self-pay | Admitting: *Deleted

## 2012-03-10 ENCOUNTER — Encounter: Payer: Self-pay | Admitting: Internal Medicine

## 2012-03-10 MED ORDER — FLUTICASONE PROPIONATE 50 MCG/ACT NA SUSP: 2.0000 | Freq: Every day | NASAL | Status: DC

## 2012-03-11 ENCOUNTER — Other Ambulatory Visit: Payer: Self-pay | Admitting: *Deleted

## 2012-03-11 MED ORDER — FLUTICASONE-SALMETEROL 250-50 MCG/DOSE IN AEPB: 1.0000 | INHALATION_SPRAY | Freq: Two times a day (BID) | RESPIRATORY_TRACT | Status: DC

## 2012-03-15 ENCOUNTER — Encounter: Payer: Self-pay | Admitting: Internal Medicine

## 2012-03-15 DIAGNOSIS — H6690 Otitis media, unspecified, unspecified ear: Secondary | ICD-10-CM

## 2012-04-16 ENCOUNTER — Telehealth: Payer: Self-pay | Admitting: Internal Medicine

## 2012-04-16 MED ORDER — CYCLOBENZAPRINE HCL 5 MG PO TABS: 5.0000 mg | ORAL_TABLET | Freq: Three times a day (TID) | ORAL | Status: DC | PRN

## 2012-04-16 NOTE — Telephone Encounter (Signed)
Patient advised as instructed via telephone.  [facility]

## 2012-04-16 NOTE — Telephone Encounter (Signed)
We can call her in flexeril 5mg  po tidprn #30 for back spasm. If no improvement, needs to be seen.

## 2012-04-16 NOTE — Telephone Encounter (Signed)
Caller: Tami Skinner/Patient; PCP: Ronna Polio; CB#: [phone];  Call regarding burning pain in mid -Back  after moving heavy boxes on 04/14/12 and she has been taking Ibupofen 400 mgs q 6hrs and takes edge off but not wanting to take d/t stomach issues. Wearing back brace. In the past she has been Rx Flexeril for back spasms. [facility] uses [facility] on [address] [phone]. PLEASE ASK IF RX CAN BE CALLED IN FOR HER. APPNT ADVISED WITHIN 3 DAYS for 'mild to moderate pain in back with narmal activity or rest AND not responding to 72 hours of home care. Advised to call office on 04/19/12 for appnt to be checked.

## 2012-06-01 ENCOUNTER — Other Ambulatory Visit: Payer: Self-pay | Admitting: Internal Medicine

## 2012-06-07 ENCOUNTER — Other Ambulatory Visit: Payer: Self-pay | Admitting: *Deleted

## 2012-06-07 DIAGNOSIS — F419 Anxiety disorder, unspecified: Secondary | ICD-10-CM

## 2012-06-07 MED ORDER — ALPRAZOLAM 0.5 MG PO TABS: ORAL_TABLET | ORAL | Status: DC

## 2012-06-07 NOTE — Telephone Encounter (Signed)
[facility]

## 2012-06-12 ENCOUNTER — Other Ambulatory Visit: Payer: Self-pay | Admitting: Internal Medicine

## 2012-06-23 ENCOUNTER — Ambulatory Visit (INDEPENDENT_AMBULATORY_CARE_PROVIDER_SITE_OTHER): Payer: BC Managed Care – PPO | Admitting: Internal Medicine

## 2012-06-23 ENCOUNTER — Encounter: Payer: Self-pay | Admitting: Internal Medicine

## 2012-06-23 VITALS — BP 130/80 | HR 95 | Temp 98.5°F | Ht 66.0 in | Wt 162.8 lb

## 2012-06-23 DIAGNOSIS — N390 Urinary tract infection, site not specified: Secondary | ICD-10-CM | POA: Insufficient documentation

## 2012-06-23 LAB — POCT URINALYSIS DIPSTICK
Bilirubin, UA: NEGATIVE
Glucose, UA: NEGATIVE
Ketones, UA: NEGATIVE
Spec Grav, UA: 1.01

## 2012-06-23 MED ORDER — SULFAMETHOXAZOLE-TMP DS 800-160 MG PO TABS: 1.0000 | ORAL_TABLET | Freq: Two times a day (BID) | ORAL | Status: DC

## 2012-06-23 NOTE — Assessment & Plan Note (Signed)
Symptoms and urinalysis are consistent with urinary tract infection. Will treat with Bactrim twice daily x7 days. Patient will call if symptoms are not improving. Will send urine for culture.

## 2012-06-23 NOTE — Progress Notes (Signed)
Subjective:    Patient ID: Tami Skinner, female    DOB: 03-10-1946, 66 y.o.   MRN: [id number]  HPI 66 year old female presents for acute visit complaining of several day history of urinary frequency, urgency, dysuria, pelvic pain, abdominal distention. She denies any fever, chills, flank pain. She denies hematuria. She took over-the-counter Pyridium with some improvement in her symptoms.  Outpatient Encounter Prescriptions as of 06/23/2012  Medication Sig Dispense Refill  . ADVAIR DISKUS 250-50 MCG/DOSE AEPB INHALE 1 PUFF INTO THE LUNGS 2 (TWO) TIMES DAILY.  60 each  2  . albuterol (PROAIR HFA) 108 (90 BASE) MCG/ACT inhaler Inhale 2 puffs into the lungs every 4 (four) hours as needed for wheezing.  1 Inhaler  2  . ALPRAZolam (XANAX) 0.5 MG tablet 1/2 tablet in am and 1 tablet at bedtime  60 tablet  3  . cetirizine (ZYRTEC) 10 MG tablet Take 10 mg by mouth daily.      . cyclobenzaprine (FLEXERIL) 5 MG tablet Take 1 tablet (5 mg total) by mouth 3 (three) times daily as needed.  30 tablet  0  . DIOVAN 160 MG tablet TAKE 1 TABLET BY MOUTH DAILY  90 tablet  1  . fluticasone (FLONASE) 50 MCG/ACT nasal spray Place 2 sprays into the nose daily.  1 g  0  . Fluticasone-Salmeterol (ADVAIR) 250-50 MCG/DOSE AEPB Inhale 1 puff into the lungs 2 (two) times daily.  60 each  2  . glucose blood (ONE TOUCH ULTRA TEST) test strip 1 each by Other route 2 (two) times daily as needed for other. Use as instructed  100 each  2  . metFORMIN (GLUCOPHAGE) 500 MG tablet TAKE 1 TABLET BY MOUTH TWICE DAILY WITH MEALS  180 tablet  1  . OXYGEN-HELIUM IN Inhale 2 % into the lungs at bedtime.        . saxagliptin HCl (ONGLYZA) 2.5 MG TABS tablet Take 1 tablet (2.5 mg total) by mouth daily.  90 tablet  1  . sertraline (ZOLOFT) 50 MG tablet Take 1 tablet (50 mg total) by mouth daily.  90 tablet  1  . SPIRIVA HANDIHALER 18 MCG inhalation capsule USE 1 CAPSULE IN INHALER DAILY  90 each  3  . sulfamethoxazole-trimethoprim (BACTRIM  DS) 800-160 MG per tablet Take 1 tablet by mouth 2 (two) times daily.  14 tablet  0   BP 130/80  Pulse 95  Temp 98.5 F (36.9 C) (Oral)  Ht 5\' 6"  (1.676 m)  Wt 162 lb 12 oz (73.823 kg)  BMI 26.27 kg/m2  SpO2 94%  Review of Systems  Constitutional: Negative for fever, chills and fatigue.  Gastrointestinal: Positive for abdominal pain and abdominal distention. Negative for nausea, vomiting, diarrhea, constipation and rectal pain.  Genitourinary: Positive for dysuria, urgency, frequency and pelvic pain. Negative for hematuria, flank pain, decreased urine volume, vaginal bleeding, vaginal discharge, difficulty urinating and vaginal pain.       Objective:   Physical Exam  Constitutional: She is oriented to person, place, and time. She appears well-developed and well-nourished. No distress.  HENT:  Head: Normocephalic and atraumatic.  Right Ear: External ear normal.  Left Ear: External ear normal.  Nose: Nose normal.  Mouth/Throat: Oropharynx is clear and moist.  Eyes: Conjunctivae are normal. Pupils are equal, round, and reactive to light. Right eye exhibits no discharge. Left eye exhibits no discharge. No scleral icterus.  Neck: Normal range of motion. Neck supple. No tracheal deviation present. No thyromegaly present.  Cardiovascular: Normal  rate, regular rhythm and normal heart sounds.   Pulmonary/Chest: Effort normal and breath sounds normal.  Abdominal: She exhibits no distension. There is tenderness (suprapubic).  Musculoskeletal: Normal range of motion. She exhibits no edema and no tenderness.  Lymphadenopathy:    She has no cervical adenopathy.  Neurological: She is alert and oriented to person, place, and time. No cranial nerve deficit. She exhibits normal muscle tone. Coordination normal.  Skin: Skin is warm and dry. No rash noted. She is not diaphoretic. No erythema. No pallor.  Psychiatric: She has a normal mood and affect. Her behavior is normal. Judgment and thought  content normal.          Assessment & Plan:

## 2012-06-25 LAB — URINE CULTURE: Organism ID, Bacteria: NO GROWTH

## 2012-07-06 ENCOUNTER — Encounter: Payer: Self-pay | Admitting: Pulmonary Disease

## 2012-07-06 ENCOUNTER — Ambulatory Visit (INDEPENDENT_AMBULATORY_CARE_PROVIDER_SITE_OTHER): Payer: BC Managed Care – PPO | Admitting: Pulmonary Disease

## 2012-07-06 VITALS — BP 130/82 | HR 74 | Temp 98.4°F | Ht 66.0 in | Wt 164.0 lb

## 2012-07-06 DIAGNOSIS — I10 Essential (primary) hypertension: Secondary | ICD-10-CM

## 2012-07-06 DIAGNOSIS — Z720 Tobacco use: Secondary | ICD-10-CM

## 2012-07-06 DIAGNOSIS — J449 Chronic obstructive pulmonary disease, unspecified: Secondary | ICD-10-CM

## 2012-07-06 DIAGNOSIS — J4489 Other specified chronic obstructive pulmonary disease: Secondary | ICD-10-CM

## 2012-07-06 DIAGNOSIS — F17200 Nicotine dependence, unspecified, uncomplicated: Secondary | ICD-10-CM

## 2012-07-06 DIAGNOSIS — G4730 Sleep apnea, unspecified: Secondary | ICD-10-CM

## 2012-07-06 MED ORDER — AZELASTINE HCL 0.1 % NA SOLN: 2.0000 | Freq: Two times a day (BID) | NASAL | Status: DC

## 2012-07-06 NOTE — Assessment & Plan Note (Signed)
Ms. Tami Skinner continues to smoke and this continues to contribute to her symptoms in terms of sputum production shortness of breath. At this point she's not an exacerbation but she is also no better than the last time we saw her. She recently completed a course of Bactrim.  Plan: -We will focus our care on her mucous by asking her to use her Flonase regularly, use the Neti Pot regularly, and to use Mucinex. -If this is not improved her sputum production in 3-4 weeks then we will treat her with Levaquin.

## 2012-07-06 NOTE — Assessment & Plan Note (Signed)
She continues to smoke on a regular basis and this is greatly contributing to her COPD symptoms .  Plan: -Discussed at length various treatment options; she wants to try quit cold [place].

## 2012-07-06 NOTE — Assessment & Plan Note (Signed)
She's not sure she is getting enough oxygen with her CPAP machine. She is compliant with therapy. Mr. long time talking today about sinus congestion the goes along with CPAP.  Plan: -See COPD above -Continue CPAP at 14 cm water -Obtain ONO on CPAP 14 cm of water and 2 L of oxygen.

## 2012-07-06 NOTE — Assessment & Plan Note (Signed)
She continues to smoke on a regular basis and this is greatly contributing to her COPD symptoms .  Plan: -Discussed at length various treatment options; she wants to try quit cold [place].  -We discussed lung cancer screening at length, she will think about the risks and benefits before requesting a test

## 2012-07-06 NOTE — Progress Notes (Signed)
Subjective:    Patient ID: Tami Skinner, female    DOB: [birth year], 66 y.o.   MRN: [id number]  Synopsis: 66 y/o female with GOLD stage II COPD initially evaluated by Dr. Delford Field in [place] transferred care of the same to [facility] in 12/2011. Still actively smoking at that time.  Most significant symptom is dyspnea.  HPI  02/03/2012 ROV-- Tami Skinner returns stating that in general she feels better since her last visit on 12/09/11 when she had an URI.  She still feels that her ear is "full of fluid" but it has slowly improved.  She is still actively smoking.  She has noted increasing dyspea over the last few years, and now does not think that she could walk 100 yards without stopping.  She does not struggle with cough.  She states that if she takes the advair and spiriva she feels better, rarely uses albuterol.  She doesn't think the proventil helps as much as prior preparations of albuterol.  September 30,013 routine office visit--Tami Skinner returns today stating that for the last several weeks she has had thick, brownish, purulent sputum. She has not been wheezing more and she feels that she's had a gradual decline in her ability to get around over the last several months. She has been compliant with her CPAP machine but she thinks that she's not getting enough oxygen she often wakes up congested and short of breath in the mornings. She has been taking her medications on a regular basis including the Spiriva and Advair. She is compliant with the oxygen through her CPAP. She has not been experiencing fevers chills or hemoptysis.   Review of Systems  Constitutional: Negative for fever, fatigue and unexpected weight change.  HENT: Negative for hearing loss, ear pain, congestion, rhinorrhea, postnasal drip and ear discharge.   Respiratory: Positive for cough and shortness of breath. Negative for choking and chest tightness.   Cardiovascular: Negative for chest pain, palpitations and leg  swelling.       Objective:   Physical Exam   Filed Vitals:   07/06/12 1649  BP: 130/82  Pulse: 74  Temp: 98.4 F (36.9 C)  TempSrc: Oral  Height: 5\' 6"  (1.676 m)  Weight: 164 lb (74.39 kg)  SpO2: 94%   Gen: well appearing, no acute distress HEENT: NCAT, PERRL, EOMi, OP clear, PULM: diminished, very poor air movement CV: RRR, no mgr, no JVD AB: BS+, soft, nontender, no hsm Ext: warm, no edema, no clubbing, no cyanosis   02/04/12 simple spirometry: F/F ratio 49%, FEV1 1.21 L (49%)     Assessment & Plan:   COPD (chronic obstructive pulmonary disease) Tami Skinner continues to smoke and this continues to contribute to her symptoms in terms of sputum production shortness of breath. At this point she's not an exacerbation but she is also no better than the last time we saw her. She recently completed a course of Bactrim.  Plan: -We will focus our care on her mucous by asking her to use her Flonase regularly, use the Neti Pot regularly, and to use Mucinex. -If this is not improved her sputum production in 3-4 weeks then we will treat her with Levaquin.  SLEEP APNEA She's not sure she is getting enough oxygen with her CPAP machine. She is compliant with therapy. Mr. long time talking today about sinus congestion the goes along with CPAP.  Plan: -See COPD above -Continue CPAP at 14 cm water -Obtain ONO on CPAP 14 cm of water  and 2 L of oxygen.  HYPERTENSION She continues to smoke on a regular basis and this is greatly contributing to her COPD symptoms .  Plan: -Discussed at length various treatment options; she wants to try quit cold [place].   Tobacco abuse She continues to smoke on a regular basis and this is greatly contributing to her COPD symptoms .  Plan: -Discussed at length various treatment options; she wants to try quit cold [place].  -We discussed lung cancer screening at length, she will think about the risks and benefits before requesting a  test    Updated Medication List Outpatient Encounter Prescriptions as of 07/06/2012  Medication Sig Dispense Refill  . ADVAIR DISKUS 250-50 MCG/DOSE AEPB INHALE 1 PUFF INTO THE LUNGS 2 (TWO) TIMES DAILY.  60 each  2  . albuterol (PROAIR HFA) 108 (90 BASE) MCG/ACT inhaler Inhale 2 puffs into the lungs every 4 (four) hours as needed for wheezing.  1 Inhaler  2  . ALPRAZolam (XANAX) 0.5 MG tablet 1/2 tablet in am and 1 tablet at bedtime  60 tablet  3  . cetirizine (ZYRTEC) 10 MG tablet Take 10 mg by mouth daily.      . cyclobenzaprine (FLEXERIL) 5 MG tablet Take 1 tablet (5 mg total) by mouth 3 (three) times daily as needed.  30 tablet  0  . DIOVAN 160 MG tablet TAKE 1 TABLET BY MOUTH DAILY  90 tablet  1  . fluticasone (FLONASE) 50 MCG/ACT nasal spray Place 2 sprays into the nose daily.  1 g  0  . Fluticasone-Salmeterol (ADVAIR) 250-50 MCG/DOSE AEPB Inhale 1 puff into the lungs 2 (two) times daily.  60 each  2  . glucose blood (ONE TOUCH ULTRA TEST) test strip 1 each by Other route 2 (two) times daily as needed for other. Use as instructed  100 each  2  . metFORMIN (GLUCOPHAGE) 500 MG tablet TAKE 1 TABLET BY MOUTH TWICE DAILY WITH MEALS  180 tablet  1  . OXYGEN-HELIUM IN Inhale 2 % into the lungs at bedtime.        . saxagliptin HCl (ONGLYZA) 2.5 MG TABS tablet Take 1 tablet (2.5 mg total) by mouth daily.  90 tablet  1  . sertraline (ZOLOFT) 50 MG tablet Take 1 tablet (50 mg total) by mouth daily.  90 tablet  1  . SPIRIVA HANDIHALER 18 MCG inhalation capsule USE 1 CAPSULE IN INHALER DAILY  90 each  3  . azelastine (ASTEPRO) 137 MCG/SPRAY nasal spray Place 2 sprays into the nose 2 (two) times daily. Use in each nostril as directed  30 mL  12

## 2012-07-06 NOTE — Patient Instructions (Signed)
Use the neti pot twice per day. Use the Astepro nasal spray two puffs each nostril twice per day Use mucinex (or generic) twice a day to help break up mucus. Keep using your inhalers as written. Stop smoking! Call us if your cough has not improved. We will see you back in 2-3 months or sooner if needed.

## 2012-07-07 ENCOUNTER — Other Ambulatory Visit: Payer: Self-pay | Admitting: Pulmonary Disease

## 2012-07-07 DIAGNOSIS — R0600 Dyspnea, unspecified: Secondary | ICD-10-CM

## 2012-07-08 ENCOUNTER — Encounter: Payer: Self-pay | Admitting: Internal Medicine

## 2012-07-11 ENCOUNTER — Other Ambulatory Visit: Payer: Self-pay | Admitting: Pulmonary Disease

## 2012-07-11 MED ORDER — AZITHROMYCIN 250 MG PO TABS: ORAL_TABLET | ORAL | Status: AC

## 2012-07-12 ENCOUNTER — Telehealth: Payer: Self-pay | Admitting: *Deleted

## 2012-07-12 NOTE — Telephone Encounter (Signed)
LMTCB

## 2012-07-12 NOTE — Telephone Encounter (Signed)
Message copied by Christen Butter on Mon Jul 12, 2012  8:50 AM ------      Message from: Veto Kemps B      Created: Sun Jul 11, 2012  5:57 AM       L,            Can you tell Ms. Dunker that she should have the CT scan at [facility] as there is no screening program at [facility] yet.            I sent a Zpack to her pharmacy when I saw this message.  She should take this prior to the CT scan.            Thanks,      B

## 2012-07-12 NOTE — Telephone Encounter (Signed)
error 

## 2012-07-12 NOTE — Progress Notes (Signed)
Sent zpack prior to screening CT scan

## 2012-07-13 ENCOUNTER — Other Ambulatory Visit: Payer: Self-pay | Admitting: Internal Medicine

## 2012-07-13 NOTE — Telephone Encounter (Signed)
Spoke with pt and notified of recs per Dr. Kendrick Fries. She verbalized understanding and will go for CT at [facility] No appt needed since this is the screening program through [facility].  She will complete Zpack prior to having this done

## 2012-07-13 NOTE — Telephone Encounter (Signed)
Pt returned call. Call her @ work [phone]. Tami Skinner

## 2012-07-14 ENCOUNTER — Encounter: Payer: Self-pay | Admitting: Internal Medicine

## 2012-07-16 ENCOUNTER — Encounter: Payer: Self-pay | Admitting: Internal Medicine

## 2012-07-16 ENCOUNTER — Other Ambulatory Visit: Payer: Self-pay | Admitting: *Deleted

## 2012-07-16 DIAGNOSIS — F419 Anxiety disorder, unspecified: Secondary | ICD-10-CM

## 2012-07-16 MED ORDER — ALPRAZOLAM 0.5 MG PO TABS: ORAL_TABLET | ORAL | Status: DC

## 2012-07-16 NOTE — Telephone Encounter (Signed)
[facility]

## 2012-07-21 ENCOUNTER — Ambulatory Visit: Payer: BC Managed Care – PPO | Admitting: Internal Medicine

## 2012-07-27 ENCOUNTER — Encounter: Payer: Self-pay | Admitting: Internal Medicine

## 2012-07-27 DIAGNOSIS — N390 Urinary tract infection, site not specified: Secondary | ICD-10-CM

## 2012-07-27 MED ORDER — SULFAMETHOXAZOLE-TMP DS 800-160 MG PO TABS: 1.0000 | ORAL_TABLET | Freq: Two times a day (BID) | ORAL | Status: AC

## 2012-08-03 ENCOUNTER — Ambulatory Visit: Payer: BC Managed Care – PPO | Admitting: Internal Medicine

## 2012-08-12 ENCOUNTER — Telehealth: Payer: Self-pay | Admitting: Pulmonary Disease

## 2012-08-12 NOTE — Telephone Encounter (Signed)
Called and spoke with pt. She states that she had ONO done through [facility] approx 2 wks ago and wants results.  I do not have these so had to call [facility] and request, will await fax  Dr. Kendrick Fries, she is asking about getting ct chest done through the cancer screening program at [facility]. Is this okay to order? With or without cm? Please advise thanks!!

## 2012-08-12 NOTE — Telephone Encounter (Signed)
I have the results, they were in Dr. Ulyses Jarred scan folder Will have this scanned in asap so he can advise on results.  Please advise thanks

## 2012-08-12 NOTE — Telephone Encounter (Signed)
lmomtcb x1--looks like Dr. Dan Humphreys ordered CT.

## 2012-08-12 NOTE — Telephone Encounter (Signed)
The pt is calling about her ct scan that will be done at [facility] and wants to know if an order was sent to them for this. She would also like to get her ONO results. Verlon Au, has the order been faxed for the ct scan and have you seen ono results? Pls advise.

## 2012-08-13 ENCOUNTER — Encounter: Payer: Self-pay | Admitting: Pulmonary Disease

## 2012-08-16 NOTE — Telephone Encounter (Signed)
OK by me if she has the screening CT

## 2012-08-16 NOTE — Telephone Encounter (Signed)
Ok, will order ct Please advise on ONO results scanned under media tab, thanks

## 2012-08-16 NOTE — Telephone Encounter (Signed)
See message I sent earlier.  No need to change her current CPAP settings or O2 Rx.

## 2012-08-17 ENCOUNTER — Telehealth: Payer: Self-pay | Admitting: Internal Medicine

## 2012-08-17 NOTE — Telephone Encounter (Signed)
Tami Skinner, CMA 08/17/2012 9:05 AM Signed  LMTCB for pt  Also need to inform her that BQ will need to sign an order for her to have the lung ca screening ct at [facility]  I have the form filled out and will give to BQ to sign when he returns to the office and then will mail to her or she can pick up  i spoke with pt and she stated she would liek the form mailed to her. I have confirmed mailing address. Also aware of ONO results as stated in 08/12/12 phone note. Will forward to leslie so she is aware of this.

## 2012-08-17 NOTE — Telephone Encounter (Signed)
LMTCB for pt Also need to inform her that BQ will need to sign an order for her to have the lung ca screening ct at [facility] I have the form filled out and will give to BQ to sign when he returns to the office and then will mail to her or she can pick up

## 2012-08-29 ENCOUNTER — Other Ambulatory Visit: Payer: Self-pay | Admitting: Internal Medicine

## 2012-09-07 ENCOUNTER — Telehealth: Payer: Self-pay | Admitting: Pulmonary Disease

## 2012-09-07 ENCOUNTER — Ambulatory Visit (INDEPENDENT_AMBULATORY_CARE_PROVIDER_SITE_OTHER): Payer: BC Managed Care – PPO | Admitting: Internal Medicine

## 2012-09-07 ENCOUNTER — Encounter: Payer: Self-pay | Admitting: Internal Medicine

## 2012-09-07 ENCOUNTER — Ambulatory Visit: Payer: Self-pay | Admitting: Pulmonary Disease

## 2012-09-07 VITALS — BP 150/100 | HR 93 | Temp 96.8°F | Ht 66.0 in

## 2012-09-07 DIAGNOSIS — R200 Anesthesia of skin: Secondary | ICD-10-CM

## 2012-09-07 DIAGNOSIS — Z23 Encounter for immunization: Secondary | ICD-10-CM

## 2012-09-07 DIAGNOSIS — E785 Hyperlipidemia, unspecified: Secondary | ICD-10-CM

## 2012-09-07 DIAGNOSIS — D510 Vitamin B12 deficiency anemia due to intrinsic factor deficiency: Secondary | ICD-10-CM

## 2012-09-07 DIAGNOSIS — R1032 Left lower quadrant pain: Secondary | ICD-10-CM | POA: Insufficient documentation

## 2012-09-07 DIAGNOSIS — B3789 Other sites of candidiasis: Secondary | ICD-10-CM

## 2012-09-07 DIAGNOSIS — F17200 Nicotine dependence, unspecified, uncomplicated: Secondary | ICD-10-CM

## 2012-09-07 DIAGNOSIS — R209 Unspecified disturbances of skin sensation: Secondary | ICD-10-CM

## 2012-09-07 DIAGNOSIS — Z0000 Encounter for general adult medical examination without abnormal findings: Secondary | ICD-10-CM

## 2012-09-07 DIAGNOSIS — N390 Urinary tract infection, site not specified: Secondary | ICD-10-CM

## 2012-09-07 DIAGNOSIS — Z780 Asymptomatic menopausal state: Secondary | ICD-10-CM

## 2012-09-07 DIAGNOSIS — E119 Type 2 diabetes mellitus without complications: Secondary | ICD-10-CM

## 2012-09-07 DIAGNOSIS — R911 Solitary pulmonary nodule: Secondary | ICD-10-CM

## 2012-09-07 LAB — COMPREHENSIVE METABOLIC PANEL
AST: 16 U/L (ref 0–37)
Albumin: 4.3 g/dL (ref 3.5–5.2)
Alkaline Phosphatase: 56 U/L (ref 39–117)
BUN: 14 mg/dL (ref 6–23)
Potassium: 3.8 mEq/L (ref 3.5–5.1)
Sodium: 138 mEq/L (ref 135–145)

## 2012-09-07 LAB — CBC WITH DIFFERENTIAL/PLATELET
Basophils Absolute: 0 10*3/uL (ref 0.0–0.1)
Lymphocytes Relative: 30 % (ref 12.0–46.0)
Lymphs Abs: 1.9 10*3/uL (ref 0.7–4.0)
Monocytes Relative: 6.7 % (ref 3.0–12.0)
Neutrophils Relative %: 61.3 % (ref 43.0–77.0)
Platelets: 295 10*3/uL (ref 150.0–400.0)
RDW: 14.5 % (ref 11.5–14.6)

## 2012-09-07 LAB — LIPID PANEL
HDL: 56.5 mg/dL (ref >39.00)
Total CHOL/HDL Ratio: 4
Triglycerides: 160 mg/dL — ABNORMAL HIGH (ref 0.0–149.0)

## 2012-09-07 LAB — POCT URINALYSIS DIPSTICK
Ketones, UA: NEGATIVE
Leukocytes, UA: NEGATIVE
Protein, UA: NEGATIVE
Spec Grav, UA: 1.01
pH, UA: 6.5

## 2012-09-07 LAB — VITAMIN B12: Vitamin B-12: 240 pg/mL (ref 211–911)

## 2012-09-07 LAB — LDL CHOLESTEROL, DIRECT: Direct LDL: 139.6 mg/dL

## 2012-09-07 LAB — HEMOGLOBIN A1C: Hgb A1c MFr Bld: 6.1 % (ref 4.6–6.5)

## 2012-09-07 MED ORDER — NYSTATIN 100000 UNIT/GM EX POWD: Freq: Four times a day (QID) | CUTANEOUS | Status: DC

## 2012-09-07 NOTE — Progress Notes (Signed)
Subjective:    Patient ID: Tami Skinner, female    DOB: 1946-07-15, 66 y.o.   MRN: [id number]  HPI  The patient is here for annual Medicare wellness examination and management of other chronic and acute problems.   The risk factors are reflected in the social history.  The roster of all physicians providing medical care to patient - is listed in the Snapshot section of the chart.  Activities of daily living:  The patient is 100% independent in all ADLs: dressing, toileting, feeding as well as independent mobility  Home safety : The patient has smoke detectors in the home. They wear seatbelts.  There are locked firearms at home. There is no violence in the home.   There is no risks for hepatitis, STDs or HIV. There is no history of blood transfusion. They have no travel history to infectious disease endemic areas of the world.  The patient has seen their dentist in the last six month. (Dr. Nelda Severe) They have seen their eye doctor in the last year. [facility]) No hearing issues, except for occasionally with soft voices.  They have deferred audiologic testing in the last year.   They do not  have excessive sun exposure. Discussed the need for sun protection: hats, long sleeves and use of sunscreen if there is significant sun exposure. Dermatologist- none.  Diet: the importance of a healthy diet is discussed. They do have a healthy diet.  The benefits of regular aerobic exercise were discussed. She does not exercise.  Pt smokes and is not interested in quitting.  Depression screen: there are no signs or vegative symptoms of depression- irritability, change in appetite, anhedonia, sadness/tearfullness.  Cognitive assessment: the patient manages all their financial and personal affairs and is actively engaged. They could relate day,date,year and events.  HCPOA - husband, Zenith Kercheval, then brother Jonne Ply  The following portions of the patient's history were reviewed and  updated as appropriate: allergies, current medications, past family history, past medical history,  past surgical history, past social history  and problem list.  Visual acuity was not assessed per patient preference since she has regular follow up with her ophthalmologist. Hearing and body mass index were assessed and reviewed.   During the course of the visit the patient was educated and counseled about appropriate screening and preventive services including : fall prevention , diabetes screening, nutrition counseling, colorectal cancer screening, and recommended immunizations.    Patient or concerns during her visit. First, she reports several week history of right arm and hand numbness and weakness in her right hand. She also notes some aching pain in her upper back. She denies any known trauma to her back or arm.  Secondly, she is concerned about a several month history of stabbing pain in her right lower abdomen that radiates to her back. She also notes some distention in her right lower abdomen. She denies any change in her bowel habits, blood in her stool, black stool, nausea, vomiting, weight loss. She denies any symptoms of dysuria, increased urinary frequency, hematuria, flank pain, fever, chills.  Third, she is concerned about rash underneath her breasts. This has been present off and on for several months. She has been applying some Desitin with improvement. The rash is described as red and itching.  Fourth, she is concerned about several year history of intermittent urinary leakage, recurrent urinary tract infections, and occasional foul smell to her urine. She reports that she has been evaluated by urology in the past  but never completed evaluation. She is concerned she has some retention of urine leading to her symptoms. She denies current fever, chills, flank pain, dysuria.  Outpatient Encounter Prescriptions as of 09/07/2012  Medication Sig Dispense Refill  . ADVAIR DISKUS 250-50  MCG/DOSE AEPB INHALE 1 PUFF INTO THE LUNGS 2 (TWO) TIMES DAILY.  60 each  2  . albuterol (PROAIR HFA) 108 (90 BASE) MCG/ACT inhaler Inhale 2 puffs into the lungs every 4 (four) hours as needed for wheezing.  1 Inhaler  2  . ALPRAZolam (XANAX) 0.5 MG tablet 1/2 tablet in am and 1 tablet at bedtime  60 tablet  3  . azelastine (ASTEPRO) 137 MCG/SPRAY nasal spray Place 2 sprays into the nose 2 (two) times daily. Use in each nostril as directed  30 mL  12  . cetirizine (ZYRTEC) 10 MG tablet Take 10 mg by mouth daily.      . cyclobenzaprine (FLEXERIL) 5 MG tablet TAKE 1 TABLET (5 MG TOTAL) BY MOUTH 3 (THREE) TIMES DAILY AS NEEDED.  30 tablet  0  . DIOVAN 160 MG tablet TAKE 1 TABLET BY MOUTH DAILY  90 tablet  1  . fluticasone (FLONASE) 50 MCG/ACT nasal spray Place 2 sprays into the nose daily.  1 g  0  . Fluticasone-Salmeterol (ADVAIR) 250-50 MCG/DOSE AEPB Inhale 1 puff into the lungs 2 (two) times daily.  60 each  2  . glucose blood (ONE TOUCH ULTRA TEST) test strip 1 each by Other route 2 (two) times daily as needed for other. Use as instructed  100 each  2  . metFORMIN (GLUCOPHAGE) 500 MG tablet TAKE 1 TABLET BY MOUTH TWICE DAILY WITH MEALS  180 tablet  1  . nystatin (MYCOSTATIN) powder Apply topically 4 (four) times daily.  15 g  0  . OXYGEN-HELIUM IN Inhale 2 % into the lungs at bedtime.        . saxagliptin HCl (ONGLYZA) 2.5 MG TABS tablet Take 1 tablet (2.5 mg total) by mouth daily.  90 tablet  1  . sertraline (ZOLOFT) 50 MG tablet TAKE 1 TABLET BY MOUTH EVERY DAY  90 tablet  1  . SPIRIVA HANDIHALER 18 MCG inhalation capsule USE 1 CAPSULE IN INHALER DAILY  90 each  3   BP 150/100  Pulse 93  Temp 96.8 F (36 C) (Oral)  Ht 5\' 6"  (1.676 m)  SpO2 98%  Review of Systems  Constitutional: Negative for fever, chills, appetite change, fatigue and unexpected weight change.  HENT: Negative for ear pain, congestion, sore throat, trouble swallowing, neck pain, voice change and sinus pressure.   Eyes:  Negative for visual disturbance.  Respiratory: Negative for cough, shortness of breath, wheezing and stridor.   Cardiovascular: Negative for chest pain, palpitations and leg swelling.  Gastrointestinal: Positive for abdominal pain and abdominal distention. Negative for nausea, vomiting, diarrhea, constipation, blood in stool and anal bleeding.  Genitourinary: Negative for dysuria and flank pain.  Musculoskeletal: Positive for myalgias and arthralgias. Negative for gait problem.  Skin: Positive for color change and rash.  Neurological: Positive for weakness and numbness. Negative for dizziness and headaches.  Hematological: Negative for adenopathy. Does not bruise/bleed easily.  Psychiatric/Behavioral: Negative for suicidal ideas, sleep disturbance and dysphoric mood. The patient is not nervous/anxious.        Objective:   Physical Exam  Constitutional: She is oriented to person, place, and time. She appears well-developed and well-nourished. No distress.  HENT:  Head: Normocephalic and atraumatic.  Right Ear:  External ear normal.  Left Ear: External ear normal.  Nose: Nose normal.  Mouth/Throat: Oropharynx is clear and moist. No oropharyngeal exudate.  Eyes: Conjunctivae normal are normal. Pupils are equal, round, and reactive to light. Right eye exhibits no discharge. Left eye exhibits no discharge. No scleral icterus.  Neck: Normal range of motion. Neck supple. No tracheal deviation present. No thyromegaly present.  Cardiovascular: Normal rate, regular rhythm, normal heart sounds and intact distal pulses.  Exam reveals no gallop and no friction rub.   No murmur heard. Pulmonary/Chest: Effort normal and breath sounds normal. No respiratory distress. She has no wheezes. She has no rales. She exhibits no tenderness.  Abdominal: Soft. Bowel sounds are normal. She exhibits distension. She exhibits no mass. There is tenderness (right lower abdomen). There is no rebound and no guarding.    Musculoskeletal: Normal range of motion. She exhibits no edema and no tenderness.  Lymphadenopathy:    She has no cervical adenopathy.  Neurological: She is alert and oriented to person, place, and time. She displays no atrophy and no tremor. No cranial nerve deficit or sensory deficit. She exhibits abnormal muscle tone (slight decreased strength right hand grasp). She displays no seizure activity. Coordination and gait normal.  Skin: Skin is warm and dry. No rash noted. She is not diaphoretic. No erythema. No pallor.  Psychiatric: She has a normal mood and affect. Her behavior is normal. Judgment and thought content normal.          Assessment & Plan:

## 2012-09-07 NOTE — Assessment & Plan Note (Signed)
Will check A1c with labs today. Continue current medications. 

## 2012-09-07 NOTE — Assessment & Plan Note (Signed)
Will set up bone density test.

## 2012-09-07 NOTE — Assessment & Plan Note (Signed)
General medical exam is normal today. Pap deferred because of age. Health maintenance is up to date. Appropriate screening performed. Acute issues addressed as below. Followup in one year for physical exam.

## 2012-09-07 NOTE — Assessment & Plan Note (Signed)
Patient with several month history of abdominal pain in the right lower quadrant. She also has distention in this area. My recommendation was for CT of the abdomen to look for ovarian or bowel pathology. Patient's health insurance provider denied coverage of CT scan and insisted on pelvic US and abdominal US. Both studies were ordered and are pending.

## 2012-09-07 NOTE — Assessment & Plan Note (Signed)
Patient with rash under her breasts consistent with candidiasis. Will treat with topical nystatin. Patient will call if symptoms are not improving.

## 2012-09-07 NOTE — Telephone Encounter (Signed)
I called Ms. Kindler because her screening CT showed a 5.66mm nodule in the RLL.  I have ordered a CT chest in 3 months to follow up this lesion.  Will see her after that CT.  Yolonda Kida PCCM Pager: [phone] Cell: [phone] If no response, call [phone]

## 2012-09-07 NOTE — Assessment & Plan Note (Signed)
Patient with recurrent episodes of urinary tract infection and question of urinary retention. Will set up urology evaluation for urodynamic studies. Urinalysis normal today.

## 2012-09-07 NOTE — Assessment & Plan Note (Signed)
Patient with right arm numbness and weakness in her right hand. Symptoms are most suggestive of cervical radiculopathy. Will get MRI of the cervical spine for further evaluation.

## 2012-09-07 NOTE — Assessment & Plan Note (Addendum)
Encourage smoking cessation

## 2012-09-13 ENCOUNTER — Other Ambulatory Visit: Payer: Self-pay | Admitting: Internal Medicine

## 2012-09-15 ENCOUNTER — Encounter: Payer: Self-pay | Admitting: Internal Medicine

## 2012-09-17 ENCOUNTER — Ambulatory Visit: Payer: Self-pay | Admitting: Internal Medicine

## 2012-09-17 ENCOUNTER — Telehealth: Payer: Self-pay | Admitting: Internal Medicine

## 2012-09-17 ENCOUNTER — Encounter: Payer: Self-pay | Admitting: Pulmonary Disease

## 2012-09-17 DIAGNOSIS — M5412 Radiculopathy, cervical region: Secondary | ICD-10-CM

## 2012-09-17 NOTE — Telephone Encounter (Addendum)
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  I will set up referral to neurosurgeon for further evaluation.  Tami Skinner - Can you please set this up?  Also, can you let pt know that [place] of abdomen was normal.

## 2012-09-20 ENCOUNTER — Encounter: Payer: Self-pay | Admitting: Internal Medicine

## 2012-09-22 ENCOUNTER — Encounter: Payer: Self-pay | Admitting: Internal Medicine

## 2012-09-27 ENCOUNTER — Encounter: Payer: Self-pay | Admitting: Pulmonary Disease

## 2012-09-28 ENCOUNTER — Encounter: Payer: Self-pay | Admitting: Internal Medicine

## 2012-10-04 ENCOUNTER — Encounter: Payer: Self-pay | Admitting: Internal Medicine

## 2012-10-05 ENCOUNTER — Ambulatory Visit (INDEPENDENT_AMBULATORY_CARE_PROVIDER_SITE_OTHER): Payer: BC Managed Care – PPO | Admitting: Internal Medicine

## 2012-10-05 ENCOUNTER — Encounter: Payer: Self-pay | Admitting: Internal Medicine

## 2012-10-05 VITALS — BP 154/100 | HR 90 | Temp 97.6°F | Resp 16 | Wt 166.0 lb

## 2012-10-05 DIAGNOSIS — I10 Essential (primary) hypertension: Secondary | ICD-10-CM

## 2012-10-05 DIAGNOSIS — R319 Hematuria, unspecified: Secondary | ICD-10-CM

## 2012-10-05 DIAGNOSIS — F411 Generalized anxiety disorder: Secondary | ICD-10-CM

## 2012-10-05 DIAGNOSIS — R300 Dysuria: Secondary | ICD-10-CM

## 2012-10-05 DIAGNOSIS — F419 Anxiety disorder, unspecified: Secondary | ICD-10-CM

## 2012-10-05 LAB — POCT URINALYSIS DIPSTICK
Nitrite, UA: NEGATIVE
Protein, UA: NEGATIVE
Spec Grav, UA: 1.01
Urobilinogen, UA: 0.2

## 2012-10-05 MED ORDER — CIPROFLOXACIN HCL 500 MG PO TABS: 500.0000 mg | ORAL_TABLET | Freq: Two times a day (BID) | ORAL | Status: DC

## 2012-10-05 MED ORDER — ALPRAZOLAM 0.5 MG PO TABS: 0.5000 mg | ORAL_TABLET | Freq: Two times a day (BID) | ORAL | Status: DC | PRN

## 2012-10-05 MED ORDER — SERTRALINE HCL 50 MG PO TABS: 50.0000 mg | ORAL_TABLET | Freq: Two times a day (BID) | ORAL | Status: DC

## 2012-10-05 NOTE — Progress Notes (Signed)
Subjective:    Patient ID: Tami Skinner, female    DOB: 03/24/1946, 66 y.o.   MRN: [id number]  HPI 66 year old female presents for acute visit complaining of several days of increased urinary frequency and dysuria. She denies fever, chills, flank pain. Urinalysis today is positive for blood. We reviewed previous urinalysis and urine culture both in November and August 2013 which showed hematuria with negative culture. She has also had some chronic right lower abdominal pain. Evaluation with urology is pending. Patient did not keep scheduled appointment with urologist in [facility] because she was reluctant to travel this far for evaluation. She would like evaluation with closer urology group. She reports that she has had cystoscopy in the past approximately 3-4 years ago and this was normal.  She reports that over the last few weeks she has had significant worsening of her anxiety. She reports having difficulty driving because of extreme anxiety when traveling. She has been taking sertraline 50 mg daily and Xanax 0.5 mg once daily with minimal improvement in symptoms.  Outpatient Encounter Prescriptions as of 10/05/2012  Medication Sig Dispense Refill  . ADVAIR DISKUS 250-50 MCG/DOSE AEPB INHALE 1 PUFF INTO THE LUNGS 2 (TWO) TIMES DAILY.  60 each  2  . ADVAIR DISKUS 250-50 MCG/DOSE AEPB INHALE 1 PUFF INTO THE LUNGS 2 (TWO) TIMES DAILY.  60 each  3  . albuterol (PROAIR HFA) 108 (90 BASE) MCG/ACT inhaler Inhale 2 puffs into the lungs every 4 (four) hours as needed for wheezing.  1 Inhaler  2  . ALPRAZolam (XANAX) 0.5 MG tablet Take 1 tablet (0.5 mg total) by mouth 2 (two) times daily as needed for sleep. 1/2 tablet in am and 1 tablet at bedtime  60 tablet  3  . azelastine (ASTEPRO) 137 MCG/SPRAY nasal spray Place 2 sprays into the nose 2 (two) times daily. Use in each nostril as directed  30 mL  12  . cetirizine (ZYRTEC) 10 MG tablet Take 10 mg by mouth daily.      . cyclobenzaprine (FLEXERIL) 5 MG  tablet TAKE 1 TABLET (5 MG TOTAL) BY MOUTH 3 (THREE) TIMES DAILY AS NEEDED.  30 tablet  0  . DIOVAN 160 MG tablet TAKE 1 TABLET BY MOUTH DAILY  90 tablet  1  . fluticasone (FLONASE) 50 MCG/ACT nasal spray Place 2 sprays into the nose daily.  1 g  0  . Fluticasone-Salmeterol (ADVAIR) 250-50 MCG/DOSE AEPB Inhale 1 puff into the lungs 2 (two) times daily.  60 each  2  . glucose blood (ONE TOUCH ULTRA TEST) test strip 1 each by Other route 2 (two) times daily as needed for other. Use as instructed  100 each  2  . metFORMIN (GLUCOPHAGE) 500 MG tablet TAKE 1 TABLET BY MOUTH TWICE DAILY WITH MEALS  180 tablet  1  . nystatin (MYCOSTATIN) powder Apply topically 4 (four) times daily.  15 g  0  . OXYGEN-HELIUM IN Inhale 2 % into the lungs at bedtime.        . saxagliptin HCl (ONGLYZA) 2.5 MG TABS tablet Take 1 tablet (2.5 mg total) by mouth daily.  90 tablet  1  . sertraline (ZOLOFT) 50 MG tablet Take 1 tablet (50 mg total) by mouth 2 (two) times daily.  180 tablet  1  . SPIRIVA HANDIHALER 18 MCG inhalation capsule USE 1 CAPSULE IN INHALER DAILY  90 each  3  . [DISCONTINUED] ALPRAZolam (XANAX) 0.5 MG tablet 1/2 tablet in am and 1 tablet at  bedtime  60 tablet  3  . [DISCONTINUED] sertraline (ZOLOFT) 50 MG tablet TAKE 1 TABLET BY MOUTH EVERY DAY  90 tablet  1  . ciprofloxacin (CIPRO) 500 MG tablet Take 1 tablet (500 mg total) by mouth 2 (two) times daily.  28 tablet  0   BP 154/100  Pulse 90  Temp 97.6 F (36.4 C) (Oral)  Resp 16  Wt 166 lb (75.297 kg)  Review of Systems  Constitutional: Negative for fever, chills, appetite change, fatigue and unexpected weight change.  HENT: Negative for ear pain, congestion, sore throat, trouble swallowing, neck pain, voice change and sinus pressure.   Eyes: Negative for visual disturbance.  Respiratory: Negative for cough, shortness of breath, wheezing and stridor.   Cardiovascular: Negative for chest pain, palpitations and leg swelling.  Gastrointestinal: Negative  for nausea, vomiting, abdominal pain, diarrhea, constipation, blood in stool, abdominal distention and anal bleeding.  Genitourinary: Positive for dysuria, urgency, frequency and pelvic pain. Negative for flank pain, decreased urine volume and vaginal pain.  Musculoskeletal: Negative for myalgias, arthralgias and gait problem.  Skin: Negative for color change and rash.  Neurological: Negative for dizziness and headaches.  Hematological: Negative for adenopathy. Does not bruise/bleed easily.  Psychiatric/Behavioral: Positive for agitation. Negative for suicidal ideas, sleep disturbance and dysphoric mood. The patient is nervous/anxious.        Objective:   Physical Exam  Constitutional: She is oriented to person, place, and time. She appears well-developed and well-nourished. No distress.  HENT:  Head: Normocephalic and atraumatic.  Right Ear: External ear normal.  Left Ear: External ear normal.  Nose: Nose normal.  Mouth/Throat: Oropharynx is clear and moist. No oropharyngeal exudate.  Eyes: Conjunctivae normal are normal. Pupils are equal, round, and reactive to light. Right eye exhibits no discharge. Left eye exhibits no discharge. No scleral icterus.  Neck: Normal range of motion. Neck supple. No tracheal deviation present. No thyromegaly present.  Cardiovascular: Normal rate, regular rhythm, normal heart sounds and intact distal pulses.  Exam reveals no gallop and no friction rub.   No murmur heard. Pulmonary/Chest: Effort normal and breath sounds normal. No respiratory distress. She has no wheezes. She has no rales. She exhibits no tenderness.  Abdominal: She exhibits no distension. There is no tenderness.  Musculoskeletal: Normal range of motion. She exhibits no edema and no tenderness.  Lymphadenopathy:    She has no cervical adenopathy.  Neurological: She is alert and oriented to person, place, and time. No cranial nerve deficit. She exhibits normal muscle tone. Coordination  normal.  Skin: Skin is warm and dry. No rash noted. She is not diaphoretic. No erythema. No pallor.  Psychiatric: Her speech is normal. Judgment and thought content normal. Her mood appears anxious. She is agitated.          Assessment & Plan:

## 2012-10-05 NOTE — Assessment & Plan Note (Signed)
Blood pressure elevated today and last visit however patient noted to be extremely anxious and agitated, which is likely contributing. We'll plan to continue to monitor blood pressure for now and repeat blood pressure check in one month in our office.

## 2012-10-05 NOTE — Assessment & Plan Note (Signed)
Patient complains of dysuria. Urinalysis is positive for blood. Reviewed previous urinalysis in November 2013 and August 2013 both of which show hematuria with negative urine culture. Referral has been placed to urology but patient did not keep appointment because appointment was set up in [facility] rather than locally. Will try to set up a local urology evaluation. Will send urine for culture and for cytology. Will treat with Cipro empirically x2 weeks.

## 2012-10-05 NOTE — Assessment & Plan Note (Signed)
Patient reports significant worsening of anxiety over the last few weeks. She is extremely anxious and agitated on exam. Will try increasing sertraline to 100 mg daily. We'll also have her increase Xanax to 0.5 mg twice daily. Followup in one month or sooner as needed.

## 2012-10-07 NOTE — Telephone Encounter (Signed)
Donell Beers information to [facility]

## 2012-10-08 ENCOUNTER — Encounter: Payer: Self-pay | Admitting: Internal Medicine

## 2012-10-08 ENCOUNTER — Ambulatory Visit: Payer: BC Managed Care – PPO | Admitting: Internal Medicine

## 2012-10-08 NOTE — Telephone Encounter (Signed)
Do we have a copy of these?

## 2012-10-12 ENCOUNTER — Ambulatory Visit: Payer: Self-pay | Admitting: Internal Medicine

## 2012-10-26 ENCOUNTER — Encounter: Payer: Self-pay | Admitting: Internal Medicine

## 2012-11-08 ENCOUNTER — Encounter: Payer: Self-pay | Admitting: Internal Medicine

## 2012-11-12 ENCOUNTER — Ambulatory Visit: Payer: BC Managed Care – PPO | Admitting: Internal Medicine

## 2012-11-17 ENCOUNTER — Ambulatory Visit: Payer: BC Managed Care – PPO | Admitting: Internal Medicine

## 2012-11-27 ENCOUNTER — Other Ambulatory Visit: Payer: Self-pay | Admitting: Internal Medicine

## 2012-11-29 ENCOUNTER — Encounter: Payer: Self-pay | Admitting: Internal Medicine

## 2012-11-29 ENCOUNTER — Other Ambulatory Visit: Payer: Self-pay | Admitting: Internal Medicine

## 2012-12-02 ENCOUNTER — Encounter: Payer: Self-pay | Admitting: Internal Medicine

## 2012-12-02 ENCOUNTER — Other Ambulatory Visit: Payer: Self-pay | Admitting: Internal Medicine

## 2012-12-02 DIAGNOSIS — F411 Generalized anxiety disorder: Secondary | ICD-10-CM

## 2012-12-02 MED ORDER — SERTRALINE HCL 50 MG PO TABS: 50.0000 mg | ORAL_TABLET | Freq: Two times a day (BID) | ORAL | Status: DC

## 2012-12-02 MED ORDER — METFORMIN HCL 500 MG PO TABS: 500.0000 mg | ORAL_TABLET | Freq: Two times a day (BID) | ORAL | Status: DC

## 2012-12-02 NOTE — Telephone Encounter (Signed)
Pt switching from [facility] ([address]  to [facility] for only two of her medications:  Metformin and sertraline.   [facility] told pt they need a paper rx to fill the prescriptions for the pt.  Please advise.

## 2012-12-03 NOTE — Telephone Encounter (Signed)
These meds have been sent to [facility].

## 2012-12-03 NOTE — Telephone Encounter (Signed)
This has been taken care of.

## 2012-12-04 ENCOUNTER — Other Ambulatory Visit: Payer: Self-pay | Admitting: Internal Medicine

## 2012-12-09 ENCOUNTER — Other Ambulatory Visit: Payer: Self-pay | Admitting: Internal Medicine

## 2012-12-09 NOTE — Telephone Encounter (Signed)
Patient was given Rx for 60 tablets with 3 RF on 10/05/2012 (printed).  Please advise.

## 2012-12-14 ENCOUNTER — Telehealth: Payer: Self-pay | Admitting: Pulmonary Disease

## 2012-12-14 NOTE — Telephone Encounter (Signed)
I have sent message to Dr. Kendrick Fries that [facility] is requesting peer to peer. Rhonda J Cobb There is nothing else that I can provide and have sent e-mail and staff message to Dr. Kendrick Fries. Rhonda J Cobb

## 2012-12-20 ENCOUNTER — Telehealth: Payer: Self-pay | Admitting: *Deleted

## 2012-12-20 ENCOUNTER — Encounter: Payer: Self-pay | Admitting: Pulmonary Disease

## 2012-12-20 DIAGNOSIS — R911 Solitary pulmonary nodule: Secondary | ICD-10-CM

## 2012-12-20 NOTE — Telephone Encounter (Signed)
Message copied by Caryl Ada on Mon Dec 20, 2012  1:33 PM ------      Message from: Max Fickle B      Created: Mon Dec 20, 2012  1:23 PM       L,            Please let her know that a 6 month f/u CT scan for her 5.67mm nodule is fine and she does not need a 3 month f/u.            Please make sure we have an office visit with her after the 6 month f/u CT.            Thanks,      B ------

## 2012-12-20 NOTE — Telephone Encounter (Signed)
Pt is aware of Dr. Ulyses Jarred recommendations. I will place an order for this to be done.

## 2012-12-28 ENCOUNTER — Encounter: Payer: Self-pay | Admitting: Pulmonary Disease

## 2013-02-07 ENCOUNTER — Telehealth: Payer: Self-pay | Admitting: *Deleted

## 2013-02-07 MED ORDER — VALSARTAN 160 MG PO TABS: 160.0000 mg | ORAL_TABLET | Freq: Every day | ORAL | Status: DC

## 2013-02-07 NOTE — Telephone Encounter (Signed)
Rx sent to pharmacy on file.

## 2013-02-07 NOTE — Telephone Encounter (Signed)
Refill request  Diovan 160 mg  #90  Take one tablet daily

## 2013-02-09 ENCOUNTER — Telehealth: Payer: Self-pay | Admitting: *Deleted

## 2013-02-09 NOTE — Telephone Encounter (Signed)
Called [phone] for prior authorization on the Diovan 160 mg, form is being faxed over now

## 2013-02-16 ENCOUNTER — Telehealth: Payer: Self-pay | Admitting: Pulmonary Disease

## 2013-02-16 NOTE — Telephone Encounter (Signed)
Pt has been made aware of her CT appointment which is 03/21/13 @ 2pm ROV has been scheduled for 03/22/13 @ 11:30am.

## 2013-02-28 ENCOUNTER — Other Ambulatory Visit: Payer: Self-pay | Admitting: Internal Medicine

## 2013-02-28 NOTE — Telephone Encounter (Signed)
Rx sent to pharmacy by escript  

## 2013-03-02 ENCOUNTER — Other Ambulatory Visit: Payer: Self-pay | Admitting: *Deleted

## 2013-03-02 MED ORDER — VALSARTAN 160 MG PO TABS: 160.0000 mg | ORAL_TABLET | Freq: Every day | ORAL | Status: DC

## 2013-03-02 NOTE — Telephone Encounter (Signed)
Eprescribed.

## 2013-03-04 ENCOUNTER — Encounter: Payer: Self-pay | Admitting: Internal Medicine

## 2013-03-16 ENCOUNTER — Other Ambulatory Visit: Payer: Self-pay | Admitting: *Deleted

## 2013-03-16 ENCOUNTER — Telehealth: Payer: Self-pay | Admitting: Internal Medicine

## 2013-03-16 DIAGNOSIS — E119 Type 2 diabetes mellitus without complications: Secondary | ICD-10-CM

## 2013-03-16 NOTE — Telephone Encounter (Signed)
Will this be ok? 

## 2013-03-16 NOTE — Telephone Encounter (Signed)
Most insurance will only cover labs the same day as the physical exam, so I would recommend waiting.

## 2013-03-16 NOTE — Telephone Encounter (Signed)
Patient informed and agreed.

## 2013-03-16 NOTE — Telephone Encounter (Signed)
OK. A1c, CMP then 250.00

## 2013-03-16 NOTE — Telephone Encounter (Signed)
Patient is wanting to know should she have labs done before her visit on 5.29.14

## 2013-03-16 NOTE — Telephone Encounter (Signed)
Spoke with patient, she stated she had made that appointment for her blood pressure medication but it was approved so she does not need to change from the Diovan. But she has not had her A1c checked since November and she does not want to fast until 130 in the afternoon.

## 2013-03-18 ENCOUNTER — Telehealth: Payer: Self-pay | Admitting: Pulmonary Disease

## 2013-03-18 NOTE — Telephone Encounter (Signed)
[email or web address].Ife Vitelli, CMA  

## 2013-03-21 ENCOUNTER — Ambulatory Visit: Payer: Self-pay | Admitting: Pulmonary Disease

## 2013-03-21 NOTE — Telephone Encounter (Signed)
melissia contacted by alida with precert # [id number] Tobe Sos

## 2013-03-21 NOTE — Telephone Encounter (Signed)
I spoke with Tami Skinner and she stated pt CT needs prior authorization. Please advise PCC's thanks

## 2013-03-21 NOTE — Telephone Encounter (Signed)
[email or web address]. Sacoya Mcgourty, CMA  

## 2013-03-21 NOTE — Telephone Encounter (Signed)
Melissa @ [facility] returned call. [phone]. Tami Skinner

## 2013-03-22 ENCOUNTER — Encounter: Payer: Self-pay | Admitting: Pulmonary Disease

## 2013-03-22 ENCOUNTER — Other Ambulatory Visit (INDEPENDENT_AMBULATORY_CARE_PROVIDER_SITE_OTHER): Payer: BC Managed Care – PPO

## 2013-03-22 ENCOUNTER — Ambulatory Visit (INDEPENDENT_AMBULATORY_CARE_PROVIDER_SITE_OTHER): Payer: BC Managed Care – PPO | Admitting: Pulmonary Disease

## 2013-03-22 VITALS — BP 132/80 | HR 80 | Temp 97.8°F | Ht 65.25 in | Wt 156.1 lb

## 2013-03-22 DIAGNOSIS — E119 Type 2 diabetes mellitus without complications: Secondary | ICD-10-CM

## 2013-03-22 DIAGNOSIS — J449 Chronic obstructive pulmonary disease, unspecified: Secondary | ICD-10-CM

## 2013-03-22 DIAGNOSIS — R911 Solitary pulmonary nodule: Secondary | ICD-10-CM

## 2013-03-22 DIAGNOSIS — J309 Allergic rhinitis, unspecified: Secondary | ICD-10-CM

## 2013-03-22 DIAGNOSIS — R1012 Left upper quadrant pain: Secondary | ICD-10-CM

## 2013-03-22 DIAGNOSIS — R9389 Abnormal findings on diagnostic imaging of other specified body structures: Secondary | ICD-10-CM

## 2013-03-22 LAB — COMPREHENSIVE METABOLIC PANEL
Albumin: 4.1 g/dL (ref 3.5–5.2)
BUN: 11 mg/dL (ref 6–23)
Calcium: 9.4 mg/dL (ref 8.4–10.5)
Chloride: 101 mEq/L (ref 96–112)
Creatinine, Ser: 0.7 mg/dL (ref 0.4–1.2)
GFR: 83.07 mL/min (ref >60.00)
Glucose, Bld: 106 mg/dL — ABNORMAL HIGH (ref 70–99)
Potassium: 4 mEq/L (ref 3.5–5.1)

## 2013-03-22 NOTE — Patient Instructions (Addendum)
We will order a CT scan of your abdomen to evaluate the nodules in your abdomen  We will order a CT of your chest in 6 months to follow up the pulmonary nodule  For you allergies: -keep taking zyrtec -use Nasacort two sprays each nostril every day -use phenylephrine as needed for sinus congestion -keep using neti pot rinses  Keep using you Advair and Spiriva  We will see you back in 6 months or sooner if needed

## 2013-03-22 NOTE — Assessment & Plan Note (Signed)
For some reason radiology recommends a PET scan of the very small, unmeasured pulmonary nodules. I reviewed the images carefully and cannot find these. Further the nodule in question which is the largest remained stable at 5.7 mm. Based on this there is no reason for a PET scan.  She does need a followup CT chest in December 2014 we'll order that today.  In regards to the left upper abdominal nodules, we talked about this at length in clinic. She has agreed to undergo a limited CT of the abdomen with contrast. We will call her with these results

## 2013-03-22 NOTE — Progress Notes (Signed)
Subjective:    Patient ID: Tami Skinner, female    DOB: January 19, 1946, 67 y.o.   MRN: [id number]  Synopsis: Tami Skinner established care for COPD in 09/2013 at the [facility] clinic after following with Dr. Delford Field for several years in [place].  She had an FEV1 of 2.96 L (90% pred) in 2007.  She was intolerant of roflumilast due to upset stomach.  She was found to have a pulmonary nodule which was 5.90mm in size in her RML in 09/2012. This lesion was stable on a May 2014 study.   HPI  03/22/2013 ROV >> Tami Skinner has been doing very well since the last visit. Her shortness of breath has improved and she is continuing to take the Spiriva and Advair. She has a lot of sneezing due to allergies which been worse in the mornings. She is taking Zyrtec which helps some. She has minimal cough. She had a CT scan yesterday to evaluate her pulmonary nodule. This is unchanged but there were 2 small left upper quadrant abdominal nodules seen on the CT scan. A contrasted CT was recommended.  Past Medical History  Diagnosis Date  . COPD (chronic obstructive pulmonary disease)   . Diverticulitis   . Diabetes mellitus   . S/P colonoscopy 2010    Dr. Servando Snare  . Hypotension   . Strep throat   . Diarrhea   . Weakness   . Bronchitis, acute, with bronchospasm   . Asthmatic bronchitis   . Constipation, chronic   . Sleep apnea   . Facial pain   . Fibrocystic breast disease   . Neck pain   . Anxiety   . Osteopenia   . Hyperlipidemia   . Diverticulosis       Review of Systems  Constitutional: Positive for fatigue. Negative for fever and chills.  HENT: Negative for congestion, rhinorrhea, sneezing, postnasal drip and sinus pressure.   Respiratory: Positive for shortness of breath. Negative for cough and wheezing.        Objective:   Physical Exam  Filed Vitals:   03/22/13 1053  BP: 132/80  Pulse: 80  Temp: 97.8 F (36.6 C)  TempSrc: Oral  Height: 5' 5.25" (1.657 m)  Weight: 156 lb 1.9 oz  (70.816 kg)  SpO2: 96%     Gen: well appearing, no acute distress HEENT: NCAT, PERRL, EOMi, OP clear, neck supple without masses PULM: CTA B CV: RRR, no mgr, no JVD AB: BS+, soft, nontender, no hsm Ext: warm, no edema, no clubbing, no cyanosis      Assessment & Plan:   Solitary pulmonary nodule For some reason radiology recommends a PET scan of the very small, unmeasured pulmonary nodules. I reviewed the images carefully and cannot find these. Further the nodule in question which is the largest remained stable at 5.7 mm. Based on this there is no reason for a PET scan.  She does need a followup CT chest in December 2014 we'll order that today.  In regards to the left upper abdominal nodules, we talked about this at length in clinic. She has agreed to undergo a limited CT of the abdomen with contrast. We will call her with these results  COPD (chronic obstructive pulmonary disease) GOLD D This has been a stable interval for [facility].  She is to continue using Advair and Spiriva.  Abnormal chest CT LUQ abdominal nodules seen.  See discussion above.  Allergic rhinitis Start Nasacort Continue Zyrtec Use phenyleprine as needed Nasal saline rinses  Updated Medication List Outpatient Encounter Prescriptions as of 03/22/2013  Medication Sig Dispense Refill  . ADVAIR DISKUS 250-50 MCG/DOSE AEPB INHALE 1 PUFF INTO THE LUNGS 2 (TWO) TIMES DAILY.  60 each  2  . ALPRAZolam (XANAX) 0.5 MG tablet TAKE 1/2 TABLET IN THE AM AND TAKE 1 TABLET BY MOUTH AT BEDTIME  60 tablet  1  . cetirizine (ZYRTEC) 10 MG tablet Take 10 mg by mouth daily.      . cyclobenzaprine (FLEXERIL) 5 MG tablet TAKE 1 TABLET (5 MG TOTAL) BY MOUTH 3 (THREE) TIMES DAILY AS NEEDED.  30 tablet  0  . fluticasone (FLONASE) 50 MCG/ACT nasal spray Place 2 sprays into the nose daily.  1 g  0  . glucose blood (ONE TOUCH ULTRA TEST) test strip 1 each by Other route 2 (two) times daily as needed for other. Use as instructed  100  each  2  . metFORMIN (GLUCOPHAGE) 500 MG tablet Take 1 tablet (500 mg total) by mouth 2 (two) times daily with a meal.  180 tablet  3  . nicotine (NICOTROL) 10 MG inhaler Inhale 1 puff into the lungs as needed for smoking cessation.      . OXYGEN-HELIUM IN Inhale 2 % into the lungs at bedtime.        . sertraline (ZOLOFT) 50 MG tablet Take 1 tablet (50 mg total) by mouth 2 (two) times daily.  90 tablet  3  . SPIRIVA HANDIHALER 18 MCG inhalation capsule USE 1 CAPSULE IN INHALER DAILY  90 each  3  . valsartan (DIOVAN) 160 MG tablet Take 1 tablet (160 mg total) by mouth daily.  90 tablet  0  . saxagliptin HCl (ONGLYZA) 2.5 MG TABS tablet Take 1 tablet (2.5 mg total) by mouth daily.  90 tablet  1  . [DISCONTINUED] ADVAIR DISKUS 250-50 MCG/DOSE AEPB INHALE 1 PUFF INTO THE LUNGS 2 (TWO) TIMES DAILY.  60 each  3  . [DISCONTINUED] azelastine (ASTEPRO) 137 MCG/SPRAY nasal spray Place 2 sprays into the nose 2 (two) times daily. Use in each nostril as directed  30 mL  12  . [DISCONTINUED] ciprofloxacin (CIPRO) 500 MG tablet Take 1 tablet (500 mg total) by mouth 2 (two) times daily.  28 tablet  0  . [DISCONTINUED] Fluticasone-Salmeterol (ADVAIR) 250-50 MCG/DOSE AEPB Inhale 1 puff into the lungs 2 (two) times daily.  60 each  2  . [DISCONTINUED] nystatin (MYCOSTATIN) powder Apply topically 4 (four) times daily.  15 g  0  . [DISCONTINUED] SPIRIVA HANDIHALER 18 MCG inhalation capsule USE 1 CAPSULE IN INHALER DAILY  90 capsule  3   No facility-administered encounter medications on file as of 03/22/2013.

## 2013-03-23 DIAGNOSIS — J309 Allergic rhinitis, unspecified: Secondary | ICD-10-CM | POA: Insufficient documentation

## 2013-03-23 DIAGNOSIS — R9389 Abnormal findings on diagnostic imaging of other specified body structures: Secondary | ICD-10-CM | POA: Insufficient documentation

## 2013-03-23 NOTE — Assessment & Plan Note (Addendum)
GOLD D This has been a stable interval for [facility].  She is to continue using Advair and Spiriva.

## 2013-03-23 NOTE — Assessment & Plan Note (Signed)
LUQ abdominal nodules seen.  See discussion above.

## 2013-03-23 NOTE — Assessment & Plan Note (Signed)
Start Nasacort Continue Zyrtec Use phenyleprine as needed Nasal saline rinses

## 2013-03-31 ENCOUNTER — Encounter: Payer: Self-pay | Admitting: Internal Medicine

## 2013-03-31 ENCOUNTER — Telehealth: Payer: Self-pay | Admitting: Pulmonary Disease

## 2013-03-31 ENCOUNTER — Ambulatory Visit (INDEPENDENT_AMBULATORY_CARE_PROVIDER_SITE_OTHER): Payer: BC Managed Care – PPO | Admitting: Internal Medicine

## 2013-03-31 VITALS — BP 120/84 | HR 98 | Temp 98.0°F | Wt 154.0 lb

## 2013-03-31 DIAGNOSIS — E785 Hyperlipidemia, unspecified: Secondary | ICD-10-CM

## 2013-03-31 DIAGNOSIS — E119 Type 2 diabetes mellitus without complications: Secondary | ICD-10-CM

## 2013-03-31 DIAGNOSIS — I10 Essential (primary) hypertension: Secondary | ICD-10-CM

## 2013-03-31 MED ORDER — LOSARTAN POTASSIUM 100 MG PO TABS: 100.0000 mg | ORAL_TABLET | Freq: Every day | ORAL | Status: DC

## 2013-03-31 MED ORDER — ATORVASTATIN CALCIUM 20 MG PO TABS: 20.0000 mg | ORAL_TABLET | Freq: Every day | ORAL | Status: DC

## 2013-03-31 NOTE — Assessment & Plan Note (Signed)
Calculated 10year risk CAD, 26%. Will start Atorvastatin 20mg  daily. Repeat lipids and lfts in 1-3 months.

## 2013-03-31 NOTE — Telephone Encounter (Signed)
Tami Skinner and gave her precert# [id number] Tami Skinner

## 2013-03-31 NOTE — Assessment & Plan Note (Signed)
BP Readings from Last 3 Encounters:  03/31/13 120/84  03/22/13 132/80  10/05/12 154/100   BP well controlled on current medication, however insurance will no longer cover Diovan. Will change to Losartan. Recheck BP in 1 week.

## 2013-03-31 NOTE — Assessment & Plan Note (Signed)
Lab Results  Component Value Date   HGBA1C 6.5 03/22/2013   Excellent control of BG on current regimen. Will plan to continue. Repeat A1c in 3 months.

## 2013-03-31 NOTE — Progress Notes (Signed)
Subjective:    Patient ID: Tami Skinner, female    DOB: July 18, 1946, 67 y.o.   MRN: [id number]  HPI  67 year old female with history of diabetes, hypertension, hyperlipidemia, COPD, anxiety/depression presents for followup. She has several concerns today. First, she reports that her insurance will no longer cover Diovan. She will need to change to another medication. In the past, she had tried an ACE inhibitor but had cough with this medication. She denies any recent headache, chest pain, palpitations. Blood pressure has been well-controlled on Diovan.  In regards to diabetes, she reports blood sugars have been well controlled. Recent A1c was 6.5%. She is no longer taking Onglyza because of cost.  She recently underwent health screening and was told that she should start medication for cholesterol. Recent cholesterol testing showed LDL was 139. She has never taken statin medications. She has no family history of heart disease.  Outpatient Encounter Prescriptions as of 03/31/2013  Medication Sig Dispense Refill  . ADVAIR DISKUS 250-50 MCG/DOSE AEPB INHALE 1 PUFF INTO THE LUNGS 2 (TWO) TIMES DAILY.  60 each  2  . ALPRAZolam (XANAX) 0.5 MG tablet TAKE 1/2 TABLET IN THE AM AND TAKE 1 TABLET BY MOUTH AT BEDTIME  60 tablet  1  . cetirizine (ZYRTEC) 10 MG tablet Take 10 mg by mouth daily.      Marland Kitchen glucose blood (ONE TOUCH ULTRA TEST) test strip 1 each by Other route 2 (two) times daily as needed for other. Use as instructed  100 each  2  . metFORMIN (GLUCOPHAGE) 500 MG tablet Take 1 tablet (500 mg total) by mouth 2 (two) times daily with a meal.  180 tablet  3  . nicotine (NICOTROL) 10 MG inhaler Inhale 1 puff into the lungs as needed for smoking cessation.      . OXYGEN-HELIUM IN Inhale 2 % into the lungs at bedtime.        . sertraline (ZOLOFT) 50 MG tablet Take 1 tablet (50 mg total) by mouth 2 (two) times daily.  90 tablet  3  . SPIRIVA HANDIHALER 18 MCG inhalation capsule USE 1 CAPSULE IN INHALER  DAILY  90 each  3  . [DISCONTINUED] valsartan (DIOVAN) 160 MG tablet Take 1 tablet (160 mg total) by mouth daily.  90 tablet  0  . atorvastatin (LIPITOR) 20 MG tablet Take 1 tablet (20 mg total) by mouth daily.  90 tablet  3  . cyclobenzaprine (FLEXERIL) 5 MG tablet TAKE 1 TABLET (5 MG TOTAL) BY MOUTH 3 (THREE) TIMES DAILY AS NEEDED.  30 tablet  0  . fluticasone (FLONASE) 50 MCG/ACT nasal spray Place 2 sprays into the nose daily.  1 g  0  . losartan (COZAAR) 100 MG tablet Take 1 tablet (100 mg total) by mouth daily.  90 tablet  3  . [DISCONTINUED] saxagliptin HCl (ONGLYZA) 2.5 MG TABS tablet Take 1 tablet (2.5 mg total) by mouth daily.  90 tablet  1   No facility-administered encounter medications on file as of 03/31/2013.   BP 120/84  Pulse 98  Temp(Src) 98 F (36.7 C) (Oral)  Wt 154 lb (69.854 kg)  BMI 25.44 kg/m2  SpO2 94%  Review of Systems  Constitutional: Negative for fever, chills, appetite change, fatigue and unexpected weight change.  HENT: Negative for ear pain, congestion, sore throat, trouble swallowing, neck pain, voice change and sinus pressure.   Eyes: Negative for visual disturbance.  Respiratory: Negative for cough, shortness of breath, wheezing and stridor.  Cardiovascular: Negative for chest pain, palpitations and leg swelling.  Gastrointestinal: Negative for nausea, vomiting, abdominal pain, diarrhea, constipation, blood in stool, abdominal distention and anal bleeding.  Genitourinary: Negative for dysuria and flank pain.  Musculoskeletal: Negative for myalgias, arthralgias and gait problem.  Skin: Negative for color change and rash.  Neurological: Negative for dizziness and headaches.  Hematological: Negative for adenopathy. Does not bruise/bleed easily.  Psychiatric/Behavioral: Negative for suicidal ideas, sleep disturbance and dysphoric mood. The patient is not nervous/anxious.        Objective:   Physical Exam  Constitutional: She is oriented to person,  place, and time. She appears well-developed and well-nourished. No distress.  HENT:  Head: Normocephalic and atraumatic.  Right Ear: External ear normal.  Left Ear: External ear normal.  Nose: Nose normal.  Mouth/Throat: Oropharynx is clear and moist. No oropharyngeal exudate.  Eyes: Conjunctivae are normal. Pupils are equal, round, and reactive to light. Right eye exhibits no discharge. Left eye exhibits no discharge. No scleral icterus.  Neck: Normal range of motion. Neck supple. No tracheal deviation present. No thyromegaly present.  Cardiovascular: Normal rate, regular rhythm, normal heart sounds and intact distal pulses.  Exam reveals no gallop and no friction rub.   No murmur heard. Pulmonary/Chest: Effort normal and breath sounds normal. No accessory muscle usage. Not tachypneic. No respiratory distress. She has no decreased breath sounds. She has no wheezes. She has no rhonchi. She has no rales. She exhibits no tenderness.  Musculoskeletal: Normal range of motion. She exhibits no edema and no tenderness.  Lymphadenopathy:    She has no cervical adenopathy.  Neurological: She is alert and oriented to person, place, and time. No cranial nerve deficit. She exhibits normal muscle tone. Coordination normal.  Skin: Skin is warm and dry. No rash noted. She is not diaphoretic. No erythema. No pallor.  Psychiatric: She has a normal mood and affect. Her behavior is normal. Judgment and thought content normal.          Assessment & Plan:

## 2013-03-31 NOTE — Telephone Encounter (Signed)
Will forward this to [facility] basket to complete, thanks

## 2013-04-01 NOTE — Telephone Encounter (Signed)
Prior auth done on secondary ins #[id number] called Tami Skinner and gave info to her Tami Skinner

## 2013-04-01 NOTE — Telephone Encounter (Signed)
PCC's have this been taking care of? Please advise thanks

## 2013-04-05 ENCOUNTER — Other Ambulatory Visit: Payer: Self-pay | Admitting: Internal Medicine

## 2013-04-05 NOTE — Telephone Encounter (Signed)
Okay to refill? 

## 2013-04-07 ENCOUNTER — Telehealth: Payer: Self-pay | Admitting: Pulmonary Disease

## 2013-04-07 NOTE — Telephone Encounter (Signed)
Spoke with [employer] with [facility].  BQ ordered a CT Abd with contrast.  Pt is scheduled for this on tomorrow but [facility] needs recent lab results d/t the contrast for the CT.  Pt had a CMET on 5/20.  Lawanna Kobus states this will be fine.  I have faxed it to the verified fax # provided above.  Angel aware and voiced no further questions or concerns at this time.

## 2013-04-08 ENCOUNTER — Telehealth: Payer: Self-pay | Admitting: Internal Medicine

## 2013-04-08 NOTE — Telephone Encounter (Signed)
It would be fine for her to stop the Lipitor and see if symptoms improve.

## 2013-04-08 NOTE — Telephone Encounter (Signed)
Fwd to Dr. Walker 

## 2013-04-08 NOTE — Telephone Encounter (Signed)
Patient Information:  Caller Name: Caela  Phone: [phone]  Patient: Tami Skinner  Gender: Female  DOB: 21-Aug-1946  Age: 67 Years  PCP: Ronna Polio (Adults only)  Office Follow Up:  Does the office need to follow up with this patient?: Yes  Instructions For The Office: Please see symptoms and follow up with pt's questions   Symptoms  Reason For Call & Symptoms: Started 5/29 Lipitor.  Instructed to call with any problems.   04/07/13 nausea and headache.  04/08/13  was suppose to have Abd CT today and unable to do it due to nausea and headache.  Pt has been in bed all day.  Rates headache 4/10.  No vomiting, no diarrhea, no abd pain.  Afebrile.   Pt requesting if she should stop medication, if another needs to be ordered, and what she can take for headache/nausea, or if she has to come in.   Please follow up with pt.  Reviewed Health History In EMR: Yes  Reviewed Medications In EMR: Yes  Reviewed Allergies In EMR: Yes  Reviewed Surgeries / Procedures: Yes  Date of Onset of Symptoms: 04/07/2013  Treatments Tried: sipping peppermint tea  Treatments Tried Worked: No  Guideline(s) Used:  Headache  Disposition Per Guideline:   See Today or Tomorrow in Office  Reason For Disposition Reached:   Unexplained headache that is present > 24 hours  Advice Given:  Rest:   Lie down in a dark, quiet place and try to relax. Close your eyes and imagine your entire body relaxing.  Apply Cold to the Area:   Apply a cold wet washcloth or cold pack to the forehead for 20 minutes.  Call Back If:  You become worse.  Patient Will Follow Care Advice:  YES

## 2013-04-08 NOTE — Telephone Encounter (Signed)
Patient informed and verbally agreed.  

## 2013-05-03 ENCOUNTER — Encounter: Payer: Self-pay | Admitting: Pulmonary Disease

## 2013-05-04 ENCOUNTER — Other Ambulatory Visit: Payer: Self-pay | Admitting: Internal Medicine

## 2013-05-09 ENCOUNTER — Ambulatory Visit: Payer: Self-pay | Admitting: Internal Medicine

## 2013-05-12 ENCOUNTER — Ambulatory Visit: Payer: Self-pay | Admitting: Pulmonary Disease

## 2013-05-16 ENCOUNTER — Encounter: Payer: Self-pay | Admitting: Pulmonary Disease

## 2013-05-16 ENCOUNTER — Telehealth: Payer: Self-pay | Admitting: Pulmonary Disease

## 2013-05-16 NOTE — Telephone Encounter (Signed)
I called Tami Skinner to let her know that the CT abdomen did not show nodules as seen on the CT chest.

## 2013-05-24 ENCOUNTER — Telehealth: Payer: Self-pay | Admitting: Internal Medicine

## 2013-05-24 NOTE — Telephone Encounter (Signed)
Patient informed, she already have an appointment scheduled for 9/3. Plan to discuss it then.

## 2013-05-24 NOTE — Telephone Encounter (Signed)
Recent bone density testing performed on 05/09/2013 showed osteopenia with T score of -1.8. They have recommended treatment. Please set up appointment for review.

## 2013-06-02 ENCOUNTER — Encounter: Payer: Self-pay | Admitting: Internal Medicine

## 2013-06-14 ENCOUNTER — Encounter: Payer: Self-pay | Admitting: Pulmonary Disease

## 2013-07-05 ENCOUNTER — Encounter: Payer: Self-pay | Admitting: *Deleted

## 2013-07-06 ENCOUNTER — Ambulatory Visit: Payer: Medicare Other | Admitting: Internal Medicine

## 2013-07-12 ENCOUNTER — Encounter: Payer: Self-pay | Admitting: Internal Medicine

## 2013-07-13 ENCOUNTER — Other Ambulatory Visit (INDEPENDENT_AMBULATORY_CARE_PROVIDER_SITE_OTHER): Payer: Medicare Other

## 2013-07-13 DIAGNOSIS — E785 Hyperlipidemia, unspecified: Secondary | ICD-10-CM

## 2013-07-13 LAB — COMPREHENSIVE METABOLIC PANEL
ALT: 14 U/L (ref 0–35)
BUN: 14 mg/dL (ref 6–23)
CO2: 29 mEq/L (ref 19–32)
Calcium: 9.6 mg/dL (ref 8.4–10.5)
Chloride: 100 mEq/L (ref 96–112)
Creatinine, Ser: 0.7 mg/dL (ref 0.4–1.2)
GFR: 93.07 mL/min (ref >60.00)
Glucose, Bld: 102 mg/dL — ABNORMAL HIGH (ref 70–99)

## 2013-07-13 LAB — LIPID PANEL
Cholesterol: 244 mg/dL — ABNORMAL HIGH (ref 0–200)
HDL: 64.1 mg/dL (ref >39.00)
Triglycerides: 129 mg/dL (ref 0.0–149.0)

## 2013-07-14 ENCOUNTER — Encounter: Payer: Self-pay | Admitting: *Deleted

## 2013-07-15 ENCOUNTER — Ambulatory Visit: Payer: Medicare Other | Admitting: Internal Medicine

## 2013-07-22 ENCOUNTER — Ambulatory Visit: Payer: Medicare Other | Admitting: Internal Medicine

## 2013-07-31 ENCOUNTER — Other Ambulatory Visit: Payer: Self-pay | Admitting: Internal Medicine

## 2013-08-01 ENCOUNTER — Encounter: Payer: Self-pay | Admitting: *Deleted

## 2013-08-02 ENCOUNTER — Ambulatory Visit (INDEPENDENT_AMBULATORY_CARE_PROVIDER_SITE_OTHER): Payer: Medicare Other | Admitting: Internal Medicine

## 2013-08-02 ENCOUNTER — Encounter: Payer: Self-pay | Admitting: Internal Medicine

## 2013-08-02 VITALS — BP 122/78 | HR 96 | Temp 98.4°F | Wt 154.0 lb

## 2013-08-02 DIAGNOSIS — Z23 Encounter for immunization: Secondary | ICD-10-CM

## 2013-08-02 DIAGNOSIS — H8110 Benign paroxysmal vertigo, unspecified ear: Secondary | ICD-10-CM | POA: Insufficient documentation

## 2013-08-02 DIAGNOSIS — I10 Essential (primary) hypertension: Secondary | ICD-10-CM

## 2013-08-02 DIAGNOSIS — Z789 Other specified health status: Secondary | ICD-10-CM | POA: Insufficient documentation

## 2013-08-02 DIAGNOSIS — Z888 Allergy status to other drugs, medicaments and biological substances status: Secondary | ICD-10-CM

## 2013-08-02 DIAGNOSIS — E119 Type 2 diabetes mellitus without complications: Secondary | ICD-10-CM

## 2013-08-02 DIAGNOSIS — J449 Chronic obstructive pulmonary disease, unspecified: Secondary | ICD-10-CM

## 2013-08-02 MED ORDER — MECLIZINE HCL 32 MG PO TABS: 32.0000 mg | ORAL_TABLET | Freq: Three times a day (TID) | ORAL | Status: DC | PRN

## 2013-08-02 NOTE — Assessment & Plan Note (Signed)
BP Readings from Last 3 Encounters:  08/02/13 122/78  03/31/13 120/84  03/22/13 132/80   Blood pressure well-controlled on current medications. Will continue.

## 2013-08-02 NOTE — Assessment & Plan Note (Signed)
Will check A1c with labs. Continue metformin. 

## 2013-08-02 NOTE — Assessment & Plan Note (Signed)
Symptomatically doing well. We'll continue current medications.

## 2013-08-02 NOTE — Assessment & Plan Note (Signed)
Recent symptoms of benign positional vertigo not improved after Epley maneuver. Will start meclizine as needed for dizziness. Will set up PT for vestibular therapy.

## 2013-08-02 NOTE — Progress Notes (Signed)
Subjective:    Patient ID: Tami Skinner, female    DOB: 01-03-1946, 67 y.o.   MRN: [id number]  HPI 67 year old female with history of diabetes, hypertension, COPD presents for followup. She reports that she is generally feeling well. She did not bring record of blood sugars today. She has been compliant with medications. No recent increase in cough, sputum production, or shortness of breath. No recent fever or chills.  She is concerned about recent episodes of vertigo. She was diagnosed as having benign positional vertigo and underwent Epley maneuver by her ENT physician. She reports minimal improvement with this. She is having almost daily vertigo with nausea. She has had several falls with no noted injuries.  Outpatient Encounter Prescriptions as of 08/02/2013  Medication Sig Dispense Refill  . ADVAIR DISKUS 250-50 MCG/DOSE AEPB INHALE 1 PUFF INTO LUNGS TWICE DAILY  60 each  6  . ALPRAZolam (XANAX) 0.5 MG tablet TAKE 1 TABLET TWICE DAILY AS NEEDED FOR SLEEP. TAKE 1/2 TABLET IN THE MORNING AND 1 TABLET AT BEDTIME  60 tablet  2  . cetirizine (ZYRTEC) 10 MG tablet Take 10 mg by mouth daily.      . Cholecalciferol (VITAMIN D-3) 1000 UNITS CAPS Take by mouth.      . cyclobenzaprine (FLEXERIL) 5 MG tablet TAKE 1 TABLET (5 MG TOTAL) BY MOUTH 3 (THREE) TIMES DAILY AS NEEDED.  30 tablet  0  . fluticasone (FLONASE) 50 MCG/ACT nasal spray Place 2 sprays into the nose daily.  1 g  0  . glucose blood (ONE TOUCH ULTRA TEST) test strip 1 each by Other route 2 (two) times daily as needed for other. Use as instructed  100 each  2  . metFORMIN (GLUCOPHAGE) 500 MG tablet Take 1 tablet (500 mg total) by mouth 2 (two) times daily with a meal.  180 tablet  3  . nicotine (NICOTROL) 10 MG inhaler Inhale 1 puff into the lungs as needed for smoking cessation.      . OXYGEN-HELIUM IN Inhale 2 % into the lungs at bedtime.        . sertraline (ZOLOFT) 50 MG tablet TAKE 1 TABLET (50 MG TOTAL) BY MOUTH 2 (TWO) TIMES DAILY.   90 tablet  0  . SPIRIVA HANDIHALER 18 MCG inhalation capsule USE 1 CAPSULE IN INHALER DAILY  90 each  3  . valsartan (DIOVAN) 160 MG tablet Take 160 mg by mouth daily.      . meclizine (ANTIVERT) 32 MG tablet Take 1 tablet (32 mg total) by mouth 3 (three) times daily as needed.  30 tablet  0  . [DISCONTINUED] atorvastatin (LIPITOR) 20 MG tablet Take 1 tablet (20 mg total) by mouth daily.  90 tablet  3  . [DISCONTINUED] losartan (COZAAR) 100 MG tablet Take 1 tablet (100 mg total) by mouth daily.  90 tablet  3   No facility-administered encounter medications on file as of 08/02/2013.   BP 122/78  Pulse 96  Temp(Src) 98.4 F (36.9 C) (Oral)  Wt 154 lb (69.854 kg)  BMI 25.44 kg/m2  SpO2 94%  Review of Systems  Constitutional: Negative for fever, chills, appetite change, fatigue and unexpected weight change.  HENT: Negative for ear pain, congestion, sore throat, trouble swallowing, neck pain, voice change and sinus pressure.   Eyes: Negative for visual disturbance.  Respiratory: Negative for cough, shortness of breath, wheezing and stridor.   Cardiovascular: Negative for chest pain, palpitations and leg swelling.  Gastrointestinal: Negative for nausea, vomiting, abdominal  pain, diarrhea, constipation, blood in stool, abdominal distention and anal bleeding.  Genitourinary: Negative for dysuria and flank pain.  Musculoskeletal: Negative for myalgias, arthralgias and gait problem.  Skin: Negative for color change and rash.  Neurological: Positive for dizziness. Negative for headaches.  Hematological: Negative for adenopathy. Does not bruise/bleed easily.  Psychiatric/Behavioral: Negative for suicidal ideas, sleep disturbance and dysphoric mood. The patient is not nervous/anxious.        Objective:   Physical Exam  Constitutional: She is oriented to person, place, and time. She appears well-developed and well-nourished. No distress.  HENT:  Head: Normocephalic and atraumatic.  Right Ear:  External ear normal.  Left Ear: External ear normal.  Nose: Nose normal.  Mouth/Throat: Oropharynx is clear and moist. No oropharyngeal exudate.  Eyes: Conjunctivae are normal. Pupils are equal, round, and reactive to light. Right eye exhibits no discharge. Left eye exhibits no discharge. No scleral icterus.  Neck: Normal range of motion. Neck supple. No tracheal deviation present. No thyromegaly present.  Cardiovascular: Normal rate, regular rhythm, normal heart sounds and intact distal pulses.  Exam reveals no gallop and no friction rub.   No murmur heard. Pulmonary/Chest: Effort normal and breath sounds normal. No accessory muscle usage. Not tachypneic. No respiratory distress. She has no decreased breath sounds. She has no wheezes. She has no rhonchi. She has no rales. She exhibits no tenderness.  Musculoskeletal: Normal range of motion. She exhibits no edema and no tenderness.  Lymphadenopathy:    She has no cervical adenopathy.  Neurological: She is alert and oriented to person, place, and time. No cranial nerve deficit. She exhibits normal muscle tone. Coordination normal.  Skin: Skin is warm and dry. No rash noted. She is not diaphoretic. No erythema. No pallor.  Psychiatric: She has a normal mood and affect. Her behavior is normal. Judgment and thought content normal.          Assessment & Plan:

## 2013-08-15 ENCOUNTER — Telehealth: Payer: Self-pay | Admitting: Internal Medicine

## 2013-08-15 NOTE — Telephone Encounter (Signed)
Spoke with patient she state she is still wheezing and has not gotten any better. Her teeth and head hurt, thinks she may have developed a sinus infection. Also when she blows her nose it is kind of a dark yellowish color. Was told to call back if did not get any better.

## 2013-08-15 NOTE — Telephone Encounter (Signed)
She will need to be seen in a visit. 

## 2013-08-15 NOTE — Telephone Encounter (Signed)
Pt is calling and saying her wheezing has not gotten any better and she thinks she may have a sinus infection. She said that you had told her to call back if she hadn't gotten any better.

## 2013-08-15 NOTE — Telephone Encounter (Signed)
Called and spoke with patient and informed her she would need to be seen. While trying to schedule appointment her phone lost signal.

## 2013-08-17 NOTE — Telephone Encounter (Signed)
Pt called back.  States she does not feel that she can drive in due to dizziness/vertigo.  No appt made at this time.

## 2013-08-18 ENCOUNTER — Other Ambulatory Visit: Payer: Self-pay | Admitting: Internal Medicine

## 2013-08-18 NOTE — Telephone Encounter (Signed)
FYI to Dr. Walker 

## 2013-08-18 NOTE — Telephone Encounter (Signed)
She needs to be seen prior to writing for any antibiotics or other medications.

## 2013-08-19 NOTE — Telephone Encounter (Signed)
Called and spoke with patient, she stated she is actually doing better and they are out of town. But once they returned and she still was not feeling good then she would call the office back to schedule an appointment.

## 2013-08-24 ENCOUNTER — Encounter: Payer: Self-pay | Admitting: Adult Health

## 2013-08-24 ENCOUNTER — Ambulatory Visit (INDEPENDENT_AMBULATORY_CARE_PROVIDER_SITE_OTHER): Payer: Medicare Other | Admitting: Adult Health

## 2013-08-24 ENCOUNTER — Ambulatory Visit: Payer: Medicare Other | Admitting: Internal Medicine

## 2013-08-24 VITALS — BP 124/76 | HR 98 | Temp 98.4°F | Resp 12 | Wt 157.0 lb

## 2013-08-24 DIAGNOSIS — R062 Wheezing: Secondary | ICD-10-CM

## 2013-08-24 DIAGNOSIS — J209 Acute bronchitis, unspecified: Secondary | ICD-10-CM

## 2013-08-24 MED ORDER — LEVOFLOXACIN 500 MG PO TABS: ORAL_TABLET | ORAL | Status: DC

## 2013-08-24 MED ORDER — ALBUTEROL SULFATE (2.5 MG/3ML) 0.083% IN NEBU
2.5000 mg | INHALATION_SOLUTION | Freq: Once | RESPIRATORY_TRACT | Status: AC
  Administered 2013-08-24: 2.5 mg via RESPIRATORY_TRACT

## 2013-08-24 MED ORDER — GUAIFENESIN-CODEINE 100-10 MG/5ML PO SOLN: 5.0000 mL | Freq: Three times a day (TID) | ORAL | Status: DC | PRN

## 2013-08-24 NOTE — Progress Notes (Signed)
Subjective:    Patient ID: Tami Skinner, female    DOB: 11/25/1945, 67 y.o.   MRN: [id number]  HPI  Pt is pleasant 67 yo female, presents to clinic with facial pain, headache, left ear pain, and cough with dark yellow phlegm x 3 weeks.  Pt reports chills but unsure if she's had fever.  Pt states she has been taking OTC sinus medication which has helped headache but not cough.  Pt states she has been using albuterol inhalers for increased wheezing, has not used today. Cough is interfering with sleep and also causing incontinence.  Current Outpatient Prescriptions on File Prior to Visit  Medication Sig Dispense Refill  . ADVAIR DISKUS 250-50 MCG/DOSE AEPB INHALE 1 PUFF INTO LUNGS TWICE DAILY  60 each  6  . ALPRAZolam (XANAX) 0.5 MG tablet TAKE 1 TABLET TWICE DAILY AS NEEDED FOR SLEEP. TAKE 1/2 TABLET IN THE MORNING AND 1 TABLET AT BEDTIME  60 tablet  2  . cetirizine (ZYRTEC) 10 MG tablet Take 10 mg by mouth daily.      . Cholecalciferol (VITAMIN D-3) 1000 UNITS CAPS Take by mouth.      . cyclobenzaprine (FLEXERIL) 5 MG tablet TAKE 1 TABLET (5 MG TOTAL) BY MOUTH 3 (THREE) TIMES DAILY AS NEEDED.  30 tablet  0  . DIOVAN 160 MG tablet TAKE 1 TABLET DAILY  90 tablet  1  . fluticasone (FLONASE) 50 MCG/ACT nasal spray Place 2 sprays into the nose daily.  1 g  0  . glucose blood (ONE TOUCH ULTRA TEST) test strip 1 each by Other route 2 (two) times daily as needed for other. Use as instructed  100 each  2  . meclizine (ANTIVERT) 32 MG tablet Take 1 tablet (32 mg total) by mouth 3 (three) times daily as needed.  30 tablet  0  . metFORMIN (GLUCOPHAGE) 500 MG tablet Take 1 tablet (500 mg total) by mouth 2 (two) times daily with a meal.  180 tablet  3  . nicotine (NICOTROL) 10 MG inhaler Inhale 1 puff into the lungs as needed for smoking cessation.      . OXYGEN-HELIUM IN Inhale 2 % into the lungs at bedtime.        . sertraline (ZOLOFT) 50 MG tablet TAKE 1 TABLET (50 MG TOTAL) BY MOUTH 2 (TWO) TIMES DAILY.   90 tablet  0  . SPIRIVA HANDIHALER 18 MCG inhalation capsule USE 1 CAPSULE IN INHALER DAILY  90 each  3   No current facility-administered medications on file prior to visit.     Review of Systems  Constitutional: Positive for chills and fatigue.  HENT: Positive for congestion, ear pain, postnasal drip and sinus pressure. Negative for facial swelling, sneezing and sore throat.        Left ear pressure  Respiratory: Positive for cough, chest tightness, shortness of breath and wheezing.        Objective:   Physical Exam  Constitutional: She is oriented to person, place, and time. She appears well-developed and well-nourished. No distress.  HENT:  Head: Normocephalic.  Right Ear: Tympanic membrane normal.  Left Ear: Tympanic membrane normal.  Nose: Nose normal.  Mouth/Throat: Mucous membranes are normal.  Cerumen build up in left ear  Eyes: Conjunctivae are normal. Pupils are equal, round, and reactive to light.  Neck: Normal range of motion.  Cardiovascular: Normal rate and regular rhythm.   Pulmonary/Chest: Effort normal. No respiratory distress. She has wheezes. She has no rales.  Lymphadenopathy:  She has no cervical adenopathy.  Neurological: She is alert and oriented to person, place, and time.  Skin: Skin is warm and dry.  Psychiatric: She has a normal mood and affect. Her behavior is normal. Judgment and thought content normal.    BP 124/76  Pulse 98  Temp(Src) 98.4 F (36.9 C) (Oral)  Resp 12  Wt 157 lb (71.215 kg)  BMI 25.94 kg/m2  SpO2 94%       Assessment & Plan:

## 2013-08-24 NOTE — Patient Instructions (Signed)
  Start Levaquin 500 mg daily for 7 days.  Continue albuterol inhaler every 4-6 hours as needed.  Robitussin a.c. for severe cough. This medication contains codeine and may make use sleepy.  Please call if her symptoms are not improved within 3-4 days or sooner if necessary.

## 2013-08-24 NOTE — Assessment & Plan Note (Signed)
Nebulizer treatment with albuterol given in office. Start Levaquin 500 mg daily x7 days. Robitussin a.c. for severe cough. RTC if symptoms are not improved within 4-5 days or sooner if necessary. Continue albuterol inhaler every 4-6 hours as needed for wheezing or shortness of breath.

## 2013-09-12 ENCOUNTER — Other Ambulatory Visit: Payer: Self-pay | Admitting: Internal Medicine

## 2013-09-12 NOTE — Telephone Encounter (Signed)
Refill

## 2013-09-13 ENCOUNTER — Other Ambulatory Visit: Payer: Self-pay | Admitting: Internal Medicine

## 2013-09-16 ENCOUNTER — Encounter: Payer: Medicare Other | Admitting: Internal Medicine

## 2013-09-22 ENCOUNTER — Other Ambulatory Visit: Payer: Self-pay | Admitting: Internal Medicine

## 2013-09-22 NOTE — Telephone Encounter (Signed)
Eprescribed.

## 2013-10-07 ENCOUNTER — Encounter: Payer: Self-pay | Admitting: Internal Medicine

## 2013-10-10 ENCOUNTER — Encounter: Payer: Self-pay | Admitting: *Deleted

## 2013-10-11 ENCOUNTER — Ambulatory Visit (INDEPENDENT_AMBULATORY_CARE_PROVIDER_SITE_OTHER): Payer: Medicare Other | Admitting: Internal Medicine

## 2013-10-11 ENCOUNTER — Other Ambulatory Visit (INDEPENDENT_AMBULATORY_CARE_PROVIDER_SITE_OTHER): Payer: Medicare Other

## 2013-10-11 ENCOUNTER — Encounter: Payer: Self-pay | Admitting: Internal Medicine

## 2013-10-11 VITALS — BP 120/80 | HR 89 | Temp 98.2°F | Ht 65.0 in | Wt 155.0 lb

## 2013-10-11 DIAGNOSIS — L301 Dyshidrosis [pompholyx]: Secondary | ICD-10-CM

## 2013-10-11 DIAGNOSIS — E785 Hyperlipidemia, unspecified: Secondary | ICD-10-CM

## 2013-10-11 DIAGNOSIS — Z0000 Encounter for general adult medical examination without abnormal findings: Secondary | ICD-10-CM

## 2013-10-11 DIAGNOSIS — N3946 Mixed incontinence: Secondary | ICD-10-CM

## 2013-10-11 DIAGNOSIS — E119 Type 2 diabetes mellitus without complications: Secondary | ICD-10-CM

## 2013-10-11 DIAGNOSIS — Z1239 Encounter for other screening for malignant neoplasm of breast: Secondary | ICD-10-CM | POA: Insufficient documentation

## 2013-10-11 LAB — COMPREHENSIVE METABOLIC PANEL
Alkaline Phosphatase: 50 U/L (ref 39–117)
BUN: 12 mg/dL (ref 6–23)
Calcium: 9.8 mg/dL (ref 8.4–10.5)
GFR: 70.67 mL/min (ref >60.00)
Glucose, Bld: 124 mg/dL — ABNORMAL HIGH (ref 70–99)
Potassium: 4.3 mEq/L (ref 3.5–5.1)
Sodium: 136 mEq/L (ref 135–145)
Total Bilirubin: 0.4 mg/dL (ref 0.3–1.2)
Total Protein: 7.2 g/dL (ref 6.0–8.3)

## 2013-10-11 LAB — CBC WITH DIFFERENTIAL/PLATELET
Basophils Absolute: 0 10*3/uL (ref 0.0–0.1)
Lymphocytes Relative: 26 % (ref 12.0–46.0)
Monocytes Relative: 5.7 % (ref 3.0–12.0)
Neutrophils Relative %: 66.1 % (ref 43.0–77.0)
Platelets: 303 10*3/uL (ref 150.0–400.0)
RDW: 14.8 % — ABNORMAL HIGH (ref 11.5–14.6)

## 2013-10-11 LAB — LIPID PANEL
Cholesterol: 236 mg/dL — ABNORMAL HIGH (ref 0–200)
HDL: 78.1 mg/dL (ref >39.00)
Total CHOL/HDL Ratio: 3
Triglycerides: 96 mg/dL (ref 0.0–149.0)
VLDL: 19.2 mg/dL (ref 0.0–40.0)

## 2013-10-11 LAB — HEMOGLOBIN A1C: Hgb A1c MFr Bld: 6.2 % (ref 4.6–6.5)

## 2013-10-11 NOTE — Patient Instructions (Signed)
Wash hands with a gentle soap such as cetaphil or Dove.  Apply vasaline and a glove over hands at night.

## 2013-10-11 NOTE — Assessment & Plan Note (Signed)
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 Recommended use of emollient cream such as Eucerin and avoidance of harsh soaps and sanitizers. If no improvement, discussed adding steroid cream.

## 2013-10-11 NOTE — Assessment & Plan Note (Signed)
Discussed stress and urge incontinence and causes. Will try Toviaz 4mg  daily. Discussed potential side effects of this medication. Samples given today. Pt will email or call with update.

## 2013-10-11 NOTE — Progress Notes (Signed)
Subjective:    Patient ID: Tami Skinner, female    DOB: [birth year], 67 y.o.   MRN: [id number]  HPI The patient is here for annual Medicare wellness examination and management of other chronic and acute problems.   The risk factors are reflected in the social history.  The roster of all physicians providing medical care to patient - is listed in the Snapshot section of the chart.  Activities of daily living:  The patient is 100% independent in all ADLs: dressing, toileting, feeding as well as independent mobility. Lives with husband. Dog in home.  Home safety : The patient has smoke detectors in the home and an alarm system. They wear seatbelts.  There are locked firearms at home. There is no violence in the home.   There is no risks for hepatitis, STDs or HIV. There is no history of blood transfusion. They have no travel history to infectious disease endemic areas of the world.  The patient has seen their dentist in the last six month. (Dentist - Dr. Nelda Severe) They have seen their eye doctor in the last year. (Opthalmologist - [facility]) No hearing issues, except for occasionally with soft voices.  They have deferred audiologic testing in the last year.   They do not  have excessive sun exposure. Discussed the need for sun protection: hats, long sleeves and use of sunscreen if there is significant sun exposure. Dermatologist- none. ENT - Dr. Willeen Cass Pulmonary - Dr. Kendrick Fries  Diet: the importance of a healthy diet is discussed. They do have a relative healthy diet.  The benefits of regular aerobic exercise were discussed. She does not exercise. Walks occasionally.  Pt quit smoking in Jan 2014. Used nicotrol inhaler.  Depression screen: there are no signs or vegative symptoms of depression- irritability, change in appetite, anhedonia, sadness/tearfullness. Having trouble adjusting to being home. Occasional days when she feels depressed, however no persistent symptoms. She feels  that Sertraline is helpful.  Cognitive assessment: the patient manages all their financial and personal affairs and is actively engaged. They could relate day,date,year and events.  HCPOA - husband, Menaal Russum, then brother Jonne Ply  The following portions of the patient's history were reviewed and updated as appropriate: allergies, current medications, past family history, past medical history,  past surgical history, past social history  and problem list.  Visual acuity was not assessed per patient preference since she has regular follow up with her ophthalmologist. Hearing and body mass index were assessed and reviewed.   During the course of the visit the patient was educated and counseled about appropriate screening and preventive services including : fall prevention , diabetes screening, nutrition counseling, colorectal cancer screening, and recommended immunizations.    She is concerned today about bladder leakage, progressively getting worse over last 2 years. Urinary leakage noted at bedtime and with activity like coughing. She tried Detrol in the past, with some improvement initially, but then wore off. She denies dysuria, fever, chills, flank pain.  She is also concerned today about dry areas over the skin on her fingers. She has been using some OTC hydrocortisone with no improvement.  Outpatient Encounter Prescriptions as of 10/11/2013  Medication Sig  . ADVAIR DISKUS 250-50 MCG/DOSE AEPB INHALE 1 PUFF INTO LUNGS TWICE DAILY  . ALPRAZolam (XANAX) 0.5 MG tablet TAKE 1 TABLET TWICE DAILY AS NEEDED FOR SLEEP. TAKE 1/2 TABLET IN THE MORNING AND 1 TABLET AT BEDTIME  . cetirizine (ZYRTEC) 10 MG tablet Take 10 mg by mouth  daily.  . Cholecalciferol (VITAMIN D-3) 1000 UNITS CAPS Take 2 capsules by mouth.   . cyclobenzaprine (FLEXERIL) 5 MG tablet TAKE 1 TABLET (5 MG TOTAL) BY MOUTH 3 (THREE) TIMES DAILY AS NEEDED.  Marland Kitchen DIOVAN 160 MG tablet TAKE 1 TABLET DAILY  . fluticasone (FLONASE)  50 MCG/ACT nasal spray Place 2 sprays into the nose daily.  Marland Kitchen glucose blood (ONE TOUCH ULTRA TEST) test strip 1 each by Other route 2 (two) times daily as needed for other. Use as instructed  . meclizine (ANTIVERT) 32 MG tablet Take 1 tablet (32 mg total) by mouth 3 (three) times daily as needed.  . metFORMIN (GLUCOPHAGE) 500 MG tablet Take 1 tablet (500 mg total) by mouth 2 (two) times daily with a meal.  . nicotine (NICOTROL) 10 MG inhaler Inhale 1 puff into the lungs as needed for smoking cessation.  . OXYGEN-HELIUM IN Inhale 2 % into the lungs at bedtime.    . sertraline (ZOLOFT) 50 MG tablet TAKE 1 TABLET (50 MG TOTAL) BY MOUTH 2 (TWO) TIMES DAILY.  Marland Kitchen SPIRIVA HANDIHALER 18 MCG inhalation capsule USE 1 CAPSULE IN INHALER DAILY    BP 120/80  Pulse 89  Temp(Src) 98.2 F (36.8 C) (Oral)  Ht 5\' 5"  (1.651 m)  Wt 155 lb (70.308 kg)  BMI 25.79 kg/m2  SpO2 95%   Review of Systems  Constitutional: Negative for fever, chills, appetite change, fatigue and unexpected weight change.  HENT: Negative for congestion, ear pain, sinus pressure, sore throat, trouble swallowing and voice change.   Eyes: Negative for visual disturbance.  Respiratory: Negative for cough, shortness of breath, wheezing and stridor.   Cardiovascular: Negative for chest pain, palpitations and leg swelling.  Gastrointestinal: Negative for nausea, vomiting, abdominal pain, diarrhea, constipation, blood in stool, abdominal distention and anal bleeding.  Genitourinary: Positive for urgency (with leakage). Negative for dysuria and flank pain.  Musculoskeletal: Negative for arthralgias, gait problem, myalgias and neck pain.  Skin: Negative for color change and rash.  Neurological: Negative for dizziness and headaches.  Hematological: Negative for adenopathy. Does not bruise/bleed easily.  Psychiatric/Behavioral: Negative for suicidal ideas, sleep disturbance and dysphoric mood. The patient is not nervous/anxious.          Objective:   Physical Exam  Constitutional: She is oriented to person, place, and time. She appears well-developed and well-nourished. No distress.  HENT:  Head: Normocephalic and atraumatic.  Right Ear: External ear normal.  Left Ear: External ear normal.  Nose: Nose normal.  Mouth/Throat: Oropharynx is clear and moist. No oropharyngeal exudate.  Eyes: Conjunctivae are normal. Pupils are equal, round, and reactive to light. Right eye exhibits no discharge. Left eye exhibits no discharge. No scleral icterus.  Neck: Normal range of motion. Neck supple. No tracheal deviation present. No thyromegaly present.  Cardiovascular: Normal rate, regular rhythm, normal heart sounds and intact distal pulses.  Exam reveals no gallop and no friction rub.   No murmur heard. Pulmonary/Chest: Effort normal and breath sounds normal. No accessory muscle usage. Not tachypneic. No respiratory distress. She has no decreased breath sounds. She has no wheezes. She has no rales. She exhibits no tenderness. Right breast exhibits no inverted nipple, no mass, no nipple discharge, no skin change and no tenderness. Left breast exhibits no inverted nipple, no mass, no nipple discharge, no skin change and no tenderness. Breasts are symmetrical.  Abdominal: Soft. Bowel sounds are normal. She exhibits no distension and no mass. There is no tenderness. There is no rebound  and no guarding.  Musculoskeletal: Normal range of motion. She exhibits no edema and no tenderness.  Lymphadenopathy:    She has no cervical adenopathy.  Neurological: She is alert and oriented to person, place, and time. No cranial nerve deficit. She exhibits normal muscle tone. Coordination normal.  Skin: Skin is warm and dry. Rash noted. Rash is pustular (dry plaques over fingers, thumb). She is not diaphoretic. No erythema. No pallor.  Psychiatric: She has a normal mood and affect. Her behavior is normal. Judgment and thought content normal.           Assessment & Plan:

## 2013-10-11 NOTE — Assessment & Plan Note (Signed)
Will check A1c with labs today. Foot exam normal today. 

## 2013-10-11 NOTE — Assessment & Plan Note (Signed)
General medical exam including breast exam is normal today. Pap and pelvic deferred because of age and s/p hysterectomy. Health maintenance is up to date. Appropriate screening performed. Immunizations are UTD. Encouraged healthy diet and exercise. Congratulated pt on smoking cessation. Followup in one year for physical exam.

## 2013-10-11 NOTE — Progress Notes (Signed)
Pre-visit discussion using our clinic review tool. No additional management support is needed unless otherwise documented below in the visit note.  

## 2013-10-12 LAB — VITAMIN D 25 HYDROXY (VIT D DEFICIENCY, FRACTURES): Vit D, 25-Hydroxy: 55 ng/mL (ref 30–89)

## 2013-10-18 ENCOUNTER — Encounter: Payer: Self-pay | Admitting: Emergency Medicine

## 2013-10-22 ENCOUNTER — Other Ambulatory Visit: Payer: Self-pay | Admitting: Internal Medicine

## 2013-11-20 ENCOUNTER — Other Ambulatory Visit: Payer: Self-pay | Admitting: Internal Medicine

## 2013-11-22 ENCOUNTER — Ambulatory Visit: Payer: Self-pay | Admitting: Internal Medicine

## 2013-11-23 ENCOUNTER — Telehealth: Payer: Self-pay | Admitting: Internal Medicine

## 2013-11-23 ENCOUNTER — Encounter: Payer: Self-pay | Admitting: Adult Health

## 2013-11-23 ENCOUNTER — Ambulatory Visit (INDEPENDENT_AMBULATORY_CARE_PROVIDER_SITE_OTHER): Payer: Medicare Other | Admitting: Adult Health

## 2013-11-23 VITALS — BP 128/76 | HR 87 | Resp 16 | Wt 160.0 lb

## 2013-11-23 DIAGNOSIS — R300 Dysuria: Secondary | ICD-10-CM

## 2013-11-23 LAB — POCT URINALYSIS DIPSTICK
BILIRUBIN UA: NEGATIVE
Glucose, UA: NEGATIVE
KETONES UA: NEGATIVE
Nitrite, UA: NEGATIVE
Protein, UA: NEGATIVE
Spec Grav, UA: 1.01
Urobilinogen, UA: 0.2
pH, UA: 6.5

## 2013-11-23 MED ORDER — CIPROFLOXACIN HCL 250 MG PO TABS: 250.0000 mg | ORAL_TABLET | Freq: Two times a day (BID) | ORAL | Status: DC

## 2013-11-23 NOTE — Progress Notes (Signed)
   Subjective:    Patient ID: Tami Skinner, female    DOB: 07/03/1946, 68 y.o.   MRN: [id number]  HPI  Pt presents to clinic with dysuria, urgency, frequency and flank pain. Denies fever or chills. Also reports foul odor of urine.  Current Outpatient Prescriptions on File Prior to Visit  Medication Sig Dispense Refill  . ADVAIR DISKUS 250-50 MCG/DOSE AEPB INHALE 1 PUFF INTO LUNGS TWICE DAILY  60 each  6  . ALPRAZolam (XANAX) 0.5 MG tablet TAKE 1 TABLET TWICE DAILY AS NEEDED FOR SLEEP. TAKE 1/2 TABLET IN THE MORNING AND 1 TABLET AT BEDTIME  60 tablet  2  . cetirizine (ZYRTEC) 10 MG tablet Take 10 mg by mouth daily.      . Cholecalciferol (VITAMIN D-3) 1000 UNITS CAPS Take 2 capsules by mouth.       . cyclobenzaprine (FLEXERIL) 5 MG tablet TAKE 1 TABLET (5 MG TOTAL) BY MOUTH 3 (THREE) TIMES DAILY AS NEEDED.  30 tablet  0  . DIOVAN 160 MG tablet TAKE 1 TABLET DAILY  90 tablet  1  . fluticasone (FLONASE) 50 MCG/ACT nasal spray Place 2 sprays into the nose daily.  1 g  0  . glucose blood (ONE TOUCH ULTRA TEST) test strip 1 each by Other route 2 (two) times daily as needed for other. Use as instructed  100 each  2  . metFORMIN (GLUCOPHAGE) 500 MG tablet TAKE 1 TABLET (500 MG TOTAL) BY MOUTH 2 (TWO) TIMES DAILY WITH A MEAL.  60 tablet  5  . nicotine (NICOTROL) 10 MG inhaler Inhale 1 puff into the lungs as needed for smoking cessation.      . OXYGEN-HELIUM IN Inhale 2 % into the lungs at bedtime.        . sertraline (ZOLOFT) 50 MG tablet TAKE 1 TABLET (50 MG TOTAL) BY MOUTH 2 (TWO) TIMES DAILY.  60 tablet  4  . SPIRIVA HANDIHALER 18 MCG inhalation capsule USE 1 CAPSULE IN INHALER DAILY  90 each  3   No current facility-administered medications on file prior to visit.     Review of Systems  Constitutional: Negative for fever and chills.  Genitourinary: Positive for dysuria, urgency, frequency and flank pain. Negative for hematuria.       Objective:   Physical Exam  Constitutional: She is  oriented to person, place, and time. No distress.  Cardiovascular: Normal rate and regular rhythm.   Pulmonary/Chest: Effort normal. No respiratory distress.  Genitourinary:  No suprapubic discomfort  Musculoskeletal: Normal range of motion.  Neurological: She is alert and oriented to person, place, and time.  Skin: Skin is warm and dry.  Psychiatric: She has a normal mood and affect. Her behavior is normal. Judgment and thought content normal.    BP 128/76  Pulse 87  Resp 16  Wt 160 lb (72.576 kg)  SpO2 95%        Assessment & Plan:

## 2013-11-23 NOTE — Progress Notes (Signed)
Pre visit review using our clinic review tool, if applicable. No additional management support is needed unless otherwise documented below in the visit note. 

## 2013-11-23 NOTE — Assessment & Plan Note (Signed)
Symptoms of UTI. UA dipstick consistent with same. Send for culture. Start Cipro x 5 days. RTC if no improvement.

## 2013-11-23 NOTE — Addendum Note (Signed)
Addended by: Montine CircleMALDONADO, Cedric Mcclaine D on: 11/23/2013 10:32 AM   Modules accepted: Orders

## 2013-11-23 NOTE — Telephone Encounter (Signed)
Radiology recommended additional views on mammogram from 11/22/2013. Has this been scheduled?

## 2013-11-23 NOTE — Patient Instructions (Signed)
  Start Cipro 250 mg twice a day for 5 days.  Please call if your symptoms do not improve.  Once we obtain the results of your urine culture we will notify you.  Urinary Tract Infection Urinary tract infections (UTIs) can develop anywhere along your urinary tract. Your urinary tract is your body's drainage system for removing wastes and extra water. Your urinary tract includes two kidneys, two ureters, a bladder, and a urethra. Your kidneys are a pair of bean-shaped organs. Each kidney is about the size of your fist. They are located below your ribs, one on each side of your spine. CAUSES Infections are caused by microbes, which are microscopic organisms, including fungi, viruses, and bacteria. These organisms are so small that they can only be seen through a microscope. Bacteria are the microbes that most commonly cause UTIs. SYMPTOMS  Symptoms of UTIs may vary by age and gender of the patient and by the location of the infection. Symptoms in young women typically include a frequent and intense urge to urinate and a painful, burning feeling in the bladder or urethra during urination. Older women and men are more likely to be tired, shaky, and weak and have muscle aches and abdominal pain. A fever may mean the infection is in your kidneys. Other symptoms of a kidney infection include pain in your back or sides below the ribs, nausea, and vomiting. DIAGNOSIS To diagnose a UTI, your caregiver will ask you about your symptoms. Your caregiver also will ask to provide a urine sample. The urine sample will be tested for bacteria and white blood cells. White blood cells are made by your body to help fight infection. TREATMENT  Typically, UTIs can be treated with medication. Because most UTIs are caused by a bacterial infection, they usually can be treated with the use of antibiotics. The choice of antibiotic and length of treatment depend on your symptoms and the type of bacteria causing your  infection. HOME CARE INSTRUCTIONS  If you were prescribed antibiotics, take them exactly as your caregiver instructs you. Finish the medication even if you feel better after you have only taken some of the medication.  Drink enough water and fluids to keep your urine clear or pale yellow.  Avoid caffeine, tea, and carbonated beverages. They tend to irritate your bladder.  Empty your bladder often. Avoid holding urine for long periods of time.  Empty your bladder before and after sexual intercourse.  After a bowel movement, women should cleanse from front to back. Use each tissue only once. SEEK MEDICAL CARE IF:   You have back pain.  You develop a fever.  Your symptoms do not begin to resolve within 3 days. SEEK IMMEDIATE MEDICAL CARE IF:   You have severe back pain or lower abdominal pain.  You develop chills.  You have nausea or vomiting.  You have continued burning or discomfort with urination. MAKE SURE YOU:   Understand these instructions.  Will watch your condition.  Will get help right away if you are not doing well or get worse. Document Released: 07/30/2005 Document Revised: 04/20/2012 Document Reviewed: 11/28/2011 [facility] Patient Information 2014 [employer], [place].

## 2013-11-23 NOTE — Telephone Encounter (Signed)
Pt has not been contacted by facility yet, notified that she will need additional views and to call our office if she hasn't been contacted within the next day or two

## 2013-11-24 ENCOUNTER — Ambulatory Visit: Payer: Self-pay | Admitting: Internal Medicine

## 2013-11-26 LAB — URINE CULTURE: Colony Count: 30000

## 2013-11-27 ENCOUNTER — Encounter: Payer: Self-pay | Admitting: Adult Health

## 2013-11-28 ENCOUNTER — Ambulatory Visit: Payer: Medicare Other | Admitting: Pulmonary Disease

## 2013-11-29 NOTE — Telephone Encounter (Signed)
Mailed unread message to pt  

## 2013-12-07 ENCOUNTER — Ambulatory Visit (INDEPENDENT_AMBULATORY_CARE_PROVIDER_SITE_OTHER): Payer: Medicare Other | Admitting: Pulmonary Disease

## 2013-12-07 ENCOUNTER — Encounter: Payer: Self-pay | Admitting: Pulmonary Disease

## 2013-12-07 VITALS — BP 126/86 | HR 92 | Ht 65.0 in | Wt 159.0 lb

## 2013-12-07 DIAGNOSIS — F32A Depression, unspecified: Secondary | ICD-10-CM | POA: Insufficient documentation

## 2013-12-07 DIAGNOSIS — F3289 Other specified depressive episodes: Secondary | ICD-10-CM

## 2013-12-07 DIAGNOSIS — R062 Wheezing: Secondary | ICD-10-CM

## 2013-12-07 DIAGNOSIS — J449 Chronic obstructive pulmonary disease, unspecified: Secondary | ICD-10-CM

## 2013-12-07 DIAGNOSIS — G4730 Sleep apnea, unspecified: Secondary | ICD-10-CM

## 2013-12-07 DIAGNOSIS — J4489 Other specified chronic obstructive pulmonary disease: Secondary | ICD-10-CM

## 2013-12-07 DIAGNOSIS — R911 Solitary pulmonary nodule: Secondary | ICD-10-CM

## 2013-12-07 DIAGNOSIS — F329 Major depressive disorder, single episode, unspecified: Secondary | ICD-10-CM | POA: Insufficient documentation

## 2013-12-07 MED ORDER — ALBUTEROL SULFATE (2.5 MG/3ML) 0.083% IN NEBU: 2.5000 mg | INHALATION_SOLUTION | Freq: Four times a day (QID) | RESPIRATORY_TRACT | Status: DC | PRN

## 2013-12-07 NOTE — Progress Notes (Signed)
Subjective:    Patient ID: Tami Skinner, female    DOB: Nov 06, 1945, 68 y.o.   MRN: [id number]  Synopsis: Tami Skinner established care for COPD in 09/2013 at the [facility] after following with Dr. Delford Field for several years in [place].  She had an FEV1 of 2.96 L (90% pred) in 2007.  She was intolerant of roflumilast due to upset stomach.  She was found to have a pulmonary nodule which was 5.28mm in size in her RML in 09/2012. This lesion was stable on a May 2014 study.   HPI   03/22/2013 ROV >> Tami Skinner has been doing very well since the last visit. Her shortness of breath has improved and she is continuing to take the Spiriva and Advair. She has a lot of sneezing due to allergies which been worse in the mornings. She is taking Zyrtec which helps some. She has minimal cough. She had a CT scan yesterday to evaluate her pulmonary nodule. This is unchanged but there were 2 small left upper quadrant abdominal nodules seen on the CT scan. A contrasted CT was recommended.  12/07/2013 ROV> Tami Skinner says that her breathing is OK, and her cough is unchanged. She has been down lately since January 27 was the anniversary of her daughter's death, she has been kind of sad since then.  She is not having suicidal thoughts, she just notes that she gets sad this time of year. For the most part her breathing has been fine and this has been a stable interval.  She had some mild wheezing with a URI a few months back and had a breathing treatment.  She said that this made her feel better than any other treatment ever had.  She is interested in switching her inhaler to something that is once a day.  She was contacted by a research company regarding a COPD study, but didn't enroll.  Past Medical History  Diagnosis Date  . COPD (chronic obstructive pulmonary disease)   . Diverticulitis   . Diabetes mellitus   . S/P colonoscopy 2010    Dr. Servando Snare  . Hypotension   . Strep throat   . Diarrhea   . Weakness    . Bronchitis, acute, with bronchospasm   . Asthmatic bronchitis   . Constipation, chronic   . Sleep apnea   . Facial pain   . Fibrocystic breast disease   . Neck pain   . Anxiety   . Osteopenia   . Hyperlipidemia   . Diverticulosis       Review of Systems  Constitutional: Negative for fever, chills and fatigue.  HENT: Negative for congestion, postnasal drip, rhinorrhea, sinus pressure and sneezing.   Respiratory: Positive for cough. Negative for shortness of breath and wheezing.        Objective:   Physical Exam   Filed Vitals:   12/07/13 1550  BP: 126/86  Pulse: 92  Height: 5\' 5"  (1.651 m)  Weight: 159 lb (72.122 kg)  SpO2: 95%   RA  Gen: well appearing, no acute distress HEENT: NCAT, EOMi, OP clear,  PULM: CTA B CV: RRR, no mgr, no JVD AB: BS+, soft, nontender, no hsm Ext: warm, no edema, no clubbing, no cyanosis      Assessment & Plan:   SLEEP APNEA Continue CPAP each bedtime with 2 L oxygen bleed in.  COPD (chronic obstructive pulmonary disease) This is been a stable interval for [facility]. She is interested in switching her inhalers to a once a  day regimen. She also is interested in getting an albuterol nebulizer to use at home on an as-needed basis.  Plan: -Albuterol nebulizer written for home use as needed -Trial of Breo inhaler daily -Hold Advair temporarily while on Breo trial -Continue Spiriva -Continue oxygen each bedtime  Solitary pulmonary nodule Repeat CT chest now, if normal final study would be November 2015.  Depression She has dealt with this off and on over the years. She currently is experiencing a bit of a low. Because it is the anniversary of her daughter's death. She feels like she will get through this she tells me she is not suicidal. She plans to talk to her primary care physician next week about it if no improvement. Reassurance provided.    Updated Medication List Outpatient Encounter Prescriptions as of 12/07/2013   Medication Sig  . ADVAIR DISKUS 250-50 MCG/DOSE AEPB INHALE 1 PUFF INTO LUNGS TWICE DAILY  . ALPRAZolam (XANAX) 0.5 MG tablet TAKE 1 TABLET TWICE DAILY AS NEEDED FOR SLEEP. TAKE 1/2 TABLET IN THE MORNING AND 1 TABLET AT BEDTIME  . cetirizine (ZYRTEC) 10 MG tablet Take 10 mg by mouth daily.  . Cholecalciferol (VITAMIN D-3) 1000 UNITS CAPS Take 2 capsules by mouth.   . cyclobenzaprine (FLEXERIL) 5 MG tablet TAKE 1 TABLET (5 MG TOTAL) BY MOUTH 3 (THREE) TIMES DAILY AS NEEDED.  Marland Kitchen. DIOVAN 160 MG tablet TAKE 1 TABLET DAILY  . fluticasone (FLONASE) 50 MCG/ACT nasal spray Place 2 sprays into the nose daily.  Marland Kitchen. glucose blood (ONE TOUCH ULTRA TEST) test strip 1 each by Other route 2 (two) times daily as needed for other. Use as instructed  . metFORMIN (GLUCOPHAGE) 500 MG tablet TAKE 1 TABLET (500 MG TOTAL) BY MOUTH 2 (TWO) TIMES DAILY WITH A MEAL.  . nicotine (NICOTROL) 10 MG inhaler Inhale 1 puff into the lungs as needed for smoking cessation.  . OXYGEN-HELIUM IN Inhale 2 % into the lungs at bedtime.    . sertraline (ZOLOFT) 50 MG tablet TAKE 1 TABLET (50 MG TOTAL) BY MOUTH 2 (TWO) TIMES DAILY.  Marland Kitchen. SPIRIVA HANDIHALER 18 MCG inhalation capsule USE 1 CAPSULE IN INHALER DAILY  . [DISCONTINUED] ciprofloxacin (CIPRO) 250 MG tablet Take 1 tablet (250 mg total) by mouth 2 (two) times daily.

## 2013-12-07 NOTE — Assessment & Plan Note (Signed)
This is been a stable interval for Tami Skinner. She is interested in switching her inhalers to a once a day regimen. She also is interested in getting an albuterol nebulizer to use at home on an as-needed basis.  Plan: -Albuterol nebulizer written for home use as needed -Trial of Breo inhaler daily -Hold Advair temporarily while on Breo trial -Continue Spiriva -Continue oxygen each bedtime

## 2013-12-07 NOTE — Assessment & Plan Note (Signed)
She has dealt with this off and on over the years. She currently is experiencing a bit of a low. Because it is the anniversary of her daughter's death. She feels like she will get through this she tells me she is not suicidal. She plans to talk to her primary care physician next week about it if no improvement. Reassurance provided.

## 2013-12-07 NOTE — Assessment & Plan Note (Signed)
Repeat CT chest now, if normal final study would be November 2015.

## 2013-12-07 NOTE — Assessment & Plan Note (Signed)
Continue CPAP each bedtime with 2 L oxygen bleed in.

## 2013-12-07 NOTE — Patient Instructions (Signed)
Try stopping the Advair for the month and use Breo instead, let us know what you think of this Use the albuterol nebulizer as needed for shortness of breath We will set up a CT scan of your chest for this month to follow up the pulmonary nodule We will see you back in 6 months or sooner if needed

## 2013-12-14 ENCOUNTER — Ambulatory Visit (INDEPENDENT_AMBULATORY_CARE_PROVIDER_SITE_OTHER): Payer: Medicare Other | Admitting: Adult Health

## 2013-12-14 ENCOUNTER — Encounter: Payer: Self-pay | Admitting: Adult Health

## 2013-12-14 VITALS — BP 120/80 | HR 80 | Temp 98.0°F | Resp 16 | Wt 162.0 lb

## 2013-12-14 DIAGNOSIS — R109 Unspecified abdominal pain: Secondary | ICD-10-CM

## 2013-12-14 DIAGNOSIS — R82998 Other abnormal findings in urine: Secondary | ICD-10-CM

## 2013-12-14 DIAGNOSIS — R8290 Unspecified abnormal findings in urine: Secondary | ICD-10-CM

## 2013-12-14 DIAGNOSIS — R1030 Lower abdominal pain, unspecified: Secondary | ICD-10-CM | POA: Insufficient documentation

## 2013-12-14 LAB — POCT URINALYSIS DIPSTICK
Bilirubin, UA: NEGATIVE
Glucose, UA: NEGATIVE
KETONES UA: NEGATIVE
Nitrite, UA: NEGATIVE
PH UA: 7
PROTEIN UA: NEGATIVE
SPEC GRAV UA: 1.01
Urobilinogen, UA: 0.2

## 2013-12-14 NOTE — Progress Notes (Signed)
Patient ID: Tami Skinner, female   DOB: 05/27/1946, 68 y.o.   MRN: [id number]    Subjective:    Patient ID: Tami CouncilMary H Mcbrearty, female    DOB: 01/02/1946, 68 y.o.   MRN: [id number]  HPI  Patient is a pleasant 68 year old female who presents to clinic with complaints of low back pain, right groin pain, pain in the right buttocks. She describes the pain as burning. She reports this discomfort has been ongoing for "months". Patient has been seen in clinic in May 2014 with complaints of right lower abdominal pain. Workup was negative which included CT scan and pelvic ultrasound. She reports that the burning sensation is also felt in her right leg. Incidentally, patient also reports a foul odor to her urine. Recently treated for urinary tract infection.   Past Medical History  Diagnosis Date  . COPD (chronic obstructive pulmonary disease)   . Diverticulitis   . Diabetes mellitus   . S/P colonoscopy 2010    Dr. Servando SnareWohl  . Hypotension   . Strep throat   . Diarrhea   . Weakness   . Bronchitis, acute, with bronchospasm   . Asthmatic bronchitis   . Constipation, chronic   . Sleep apnea   . Facial pain   . Fibrocystic breast disease   . Neck pain   . Anxiety   . Osteopenia   . Hyperlipidemia   . Diverticulosis     Current Outpatient Prescriptions on File Prior to Visit  Medication Sig Dispense Refill  . albuterol (PROVENTIL) (2.5 MG/3ML) 0.083% nebulizer solution Take 3 mLs (2.5 mg total) by nebulization every 6 (six) hours as needed for wheezing or shortness of breath.  75 mL  12  . ALPRAZolam (XANAX) 0.5 MG tablet TAKE 1 TABLET TWICE DAILY AS NEEDED FOR SLEEP. TAKE 1/2 TABLET IN THE MORNING AND 1 TABLET AT BEDTIME  60 tablet  2  . cetirizine (ZYRTEC) 10 MG tablet Take 10 mg by mouth daily.      . Cholecalciferol (VITAMIN D-3) 1000 UNITS CAPS Take 2 capsules by mouth.       . cyclobenzaprine (FLEXERIL) 5 MG tablet TAKE 1 TABLET (5 MG TOTAL) BY MOUTH 3 (THREE) TIMES DAILY AS NEEDED.  30 tablet   0  . DIOVAN 160 MG tablet TAKE 1 TABLET DAILY  90 tablet  1  . fluticasone (FLONASE) 50 MCG/ACT nasal spray Place 2 sprays into the nose daily.  1 g  0  . glucose blood (ONE TOUCH ULTRA TEST) test strip 1 each by Other route 2 (two) times daily as needed for other. Use as instructed  100 each  2  . metFORMIN (GLUCOPHAGE) 500 MG tablet TAKE 1 TABLET (500 MG TOTAL) BY MOUTH 2 (TWO) TIMES DAILY WITH A MEAL.  60 tablet  5  . nicotine (NICOTROL) 10 MG inhaler Inhale 1 puff into the lungs as needed for smoking cessation.      . OXYGEN-HELIUM IN Inhale 2 % into the lungs at bedtime.        . sertraline (ZOLOFT) 50 MG tablet TAKE 1 TABLET (50 MG TOTAL) BY MOUTH 2 (TWO) TIMES DAILY.  60 tablet  4  . SPIRIVA HANDIHALER 18 MCG inhalation capsule USE 1 CAPSULE IN INHALER DAILY  90 each  3   No current facility-administered medications on file prior to visit.     Review of Systems  Genitourinary:       Foul odor of urine  Musculoskeletal: Positive for back pain (low  back pain radiating down right buttocks).       Pain in the lower middle portion of r buttocks and radiates to pelvis and groin. Also, radiating to right thigh       Objective:  BP 120/80  Pulse 80  Temp(Src) 98 F (36.7 C) (Oral)  Wt 162 lb (73.483 kg)  SpO2 93%   Physical Exam  Abdominal: Soft. She exhibits no distension and no mass. There is no tenderness. There is no rebound and no guarding.  Musculoskeletal: Normal range of motion. She exhibits tenderness.  Tenderness with palpation of lower back. She also has tenderness with deep palpation of right center buttocks      Assessment & Plan:   1. Groin pain No palpable masses on abdomen or groin on exam. Given her report of low back pain radiating to her right lower buttocks and groin, I suspect it may be her sciatic nerve. She has a hx of chronic low back pain. If not relieved with physical therapy she will need to be re-evaluated.  - Ambulatory referral to Physical  Therapy  2. Abnormal urine odor UA shows trace blood. She is not having any dysuria. Send for urinalysis to evaluate for RBCs and culture. Will hold off on antibiotic until tests resulted.  - POCT urinalysis dipstick - Urine culture - Urinalysis

## 2013-12-14 NOTE — Progress Notes (Signed)
Pre visit review using our clinic review tool, if applicable. No additional management support is needed unless otherwise documented below in the visit note. 

## 2013-12-15 LAB — URINE CULTURE
Colony Count: NO GROWTH
Organism ID, Bacteria: NO GROWTH

## 2013-12-16 ENCOUNTER — Telehealth: Payer: Self-pay | Admitting: Pulmonary Disease

## 2013-12-16 ENCOUNTER — Encounter: Payer: Self-pay | Admitting: Adult Health

## 2013-12-16 NOTE — Telephone Encounter (Signed)
[facility] she is working on the additional paperwork currently.

## 2013-12-19 ENCOUNTER — Encounter: Payer: Self-pay | Admitting: Internal Medicine

## 2013-12-19 NOTE — Telephone Encounter (Signed)
Yes you can close it Tami [name] [name]

## 2013-12-19 NOTE — Telephone Encounter (Signed)
Mailed unread MyChart message to pt  

## 2013-12-19 NOTE — Telephone Encounter (Signed)
Tami FreeLibby do you still need this phone note or can we close it?

## 2013-12-21 ENCOUNTER — Other Ambulatory Visit: Payer: Self-pay | Admitting: Internal Medicine

## 2013-12-21 ENCOUNTER — Ambulatory Visit: Payer: Self-pay | Admitting: Pulmonary Disease

## 2013-12-21 MED ORDER — ALPRAZOLAM 0.5 MG PO TABS: ORAL_TABLET | ORAL | Status: DC

## 2013-12-23 ENCOUNTER — Telehealth: Payer: Self-pay | Admitting: Pulmonary Disease

## 2013-12-23 NOTE — Telephone Encounter (Signed)
I called and spoke with pt. Made her aware BQ not in until Monday. Nothing further needed

## 2013-12-26 ENCOUNTER — Telehealth: Payer: Self-pay | Admitting: Pulmonary Disease

## 2013-12-26 NOTE — Telephone Encounter (Signed)
Questionnaire copied below per Mindy's request:   Status Change Notification    The following message was taken by Tommie Sams, CMA on Mon Dec 26, 2013  3:54 PM.          More Detail >>      [id number]      Tami Skinner        Sent: Mon December 26, 2013 12:33 AM      To: [facility]              Message      Would like to know the results of my chest CT at your soonest opportunity. I received a notice on Saturday from my [employer] PPO that they did not approve the request from your office to have CT scan. I called your office the morning of my scan to ensure prior approval had been received. I was told prior approval was received!             ----- Message -----      From: Max Fickle, MD      Sent: 12/09/2013 12:00 AM EST      To: Tami Skinner      Subject: Questionnaire             To ensure we are providing you the highest quality healthcare, we'd like to know how you are feeling after your recent visit. At your earliest convenience, please complete the brief follow-up assessment by clicking the Task: Questionnaire link listed above.             Thank you for your time in helping [place] improve our services and for partnering with [place] in your wellness and care.             Sincerely,             Your Care Team                [facility] Size     Small     Medium     Large     Extra Extra Skip Mayer              NAVJOT PILGRIM  12/07/2013 3:45 PM   Office Visit  MRN:  [id number]   Description: 68 year old female  Provider: Lupita Leash, MD  Department: [facility]          Reason for Call      Follow-up      Pt has no complaints at this time.  Recently recovered from sinus infection.               Diagnoses      Solitary pulmonary nodule    -  Primary      793.11      COPD (chronic obstructive pulmonary disease)          496      Wheezing          786.07      Unspecified sleep apnea         780.57      Depression          311               Call Documentation      Lupita Leash, MD at 12/07/2013  3:56 PM      Status: Signed  Subjective:       Patient ID: Tami Skinner, female    DOB: April 09, 1946, 68 y.o.   MRN: [id number]   Synopsis: Ms. Zwiebel established care for COPD in 09/2013 at the [facility] clinic after following with Dr. Delford Field for several years in [place].  She had an FEV1 of 2.96 L (90% pred) in 2007.  She was intolerant of roflumilast due to upset stomach.  She was found to have a pulmonary nodule which was 5.71mm in size in her RML in 09/2012. This lesion was stable on a May 2014 study.    HPI     03/22/2013 ROV >> Leslyn has been doing very well since the last visit. Her shortness of breath has improved and she is continuing to take the Spiriva and Advair. She has a lot of sneezing due to allergies which been worse in the mornings. She is taking Zyrtec which helps some. She has minimal cough. She had a CT scan yesterday to evaluate her pulmonary nodule. This is unchanged but there were 2 small left upper quadrant abdominal nodules seen on the CT scan. A contrasted CT was recommended.   12/07/2013 ROV> Caoimhe says that her breathing is OK, and her cough is unchanged. She has been down lately since January 27 was the anniversary of her daughter's death, she has been kind of sad since then.  She is not having suicidal thoughts, she just notes that she gets sad this time of year. For the most part her breathing has been fine and this has been a stable interval.  She had some mild wheezing with a URI a few months back and had a breathing treatment.  She said that this made her feel better than any other treatment ever had.  She is interested in switching her inhaler to something that is once a day.   She was contacted by a research company regarding a COPD study, but didn't enroll.    Past Medical History   Diagnosis  Date     COPD  (chronic obstructive pulmonary disease)       Diverticulitis       Diabetes mellitus       S/P colonoscopy  2010       Dr. Servando Snare     Hypotension       Strep throat       Diarrhea       Weakness       Bronchitis, acute, with bronchospasm       Asthmatic bronchitis       Constipation, chronic       Sleep apnea       Facial pain       Fibrocystic breast disease       Neck pain       Anxiety       Osteopenia       Hyperlipidemia       Diverticulosis              Review of Systems  Constitutional: Negative for fever, chills and fatigue.  HENT: Negative for congestion, postnasal drip, rhinorrhea, sinus pressure and sneezing.  Respiratory: Positive for cough. Negative for shortness of breath and wheezing.           Objective:     Physical Exam      Filed Vitals:     12/07/13 1550   BP:  126/86   Pulse:  92   Height:  5\' 5"  (1.651 m)  Weight:  159 lb (72.122 kg)   SpO2:  95%      RA   Gen: well appearing, no acute distress HEENT: NCAT, EOMi, OP clear,   PULM: CTA B CV: RRR, no mgr, no JVD AB: BS+, soft, nontender, no hsm Ext: warm, no edema, no clubbing, no cyanosis         Assessment & Plan:      SLEEP APNEA Continue CPAP each bedtime with 2 L oxygen bleed in.   COPD (chronic obstructive pulmonary disease) [facility]. She is interested in switching her inhalers to a once a day regimen. She also is interested in getting an albuterol nebulizer to use at home on an as-needed basis.   Plan: -Albuterol nebulizer written for home use as needed -Trial of Breo inhaler daily -Hold Advair temporarily while on Breo trial -Continue Spiriva -Continue oxygen each bedtime   Solitary pulmonary nodule Repeat CT chest now, if normal final study would be November 2015.   Depression She has dealt with this off and on over the years. She currently is experiencing a bit of a low. Because it is the  anniversary of her daughter's death. She feels like she will get through this she tells me she is not suicidal. She plans to talk to her primary care physician next week about it if no improvement. Reassurance provided.       Updated Medication List Outpatient Encounter Prescriptions as of 12/07/2013   Medication  Sig     ADVAIR DISKUS 250-50 MCG/DOSE AEPB  INHALE 1 PUFF INTO LUNGS TWICE DAILY     ALPRAZolam (XANAX) 0.5 MG tablet  TAKE 1 TABLET TWICE DAILY AS NEEDED FOR SLEEP. TAKE 1/2 TABLET IN THE MORNING AND 1 TABLET AT BEDTIME     cetirizine (ZYRTEC) 10 MG tablet  Take 10 mg by mouth daily.     Cholecalciferol (VITAMIN D-3) 1000 UNITS CAPS  Take 2 capsules by mouth.      cyclobenzaprine (FLEXERIL) 5 MG tablet  TAKE 1 TABLET (5 MG TOTAL) BY MOUTH 3 (THREE) TIMES DAILY AS NEEDED.     DIOVAN 160 MG tablet  TAKE 1 TABLET DAILY     fluticasone (FLONASE) 50 MCG/ACT nasal spray  Place 2 sprays into the nose daily.     glucose blood (ONE TOUCH ULTRA TEST) test strip  1 each by Other route 2 (two) times daily as needed for other. Use as instructed     metFORMIN (GLUCOPHAGE) 500 MG tablet  TAKE 1 TABLET (500 MG TOTAL) BY MOUTH 2 (TWO) TIMES DAILY WITH A MEAL.     nicotine (NICOTROL) 10 MG inhaler  Inhale 1 puff into the lungs as needed for smoking cessation.     OXYGEN-HELIUM IN  Inhale 2 % into the lungs at bedtime.       sertraline (ZOLOFT) 50 MG tablet  TAKE 1 TABLET (50 MG TOTAL) BY MOUTH 2 (TWO) TIMES DAILY.     SPIRIVA HANDIHALER 18 MCG inhalation capsule  USE 1 CAPSULE IN INHALER DAILY     [DISCONTINUED] ciprofloxacin (CIPRO) 250 MG tablet  Take 1 tablet (250 mg total) by mouth 2 (two) times daily.                            Encounter MyChart Messages      Read Composed From To Subject      Y 12/26/2013 12:33 AM Jimmie MollyMary H  Roselee Nova, MD [id number]      Y 12/15/2013 12:32 PM Cam Hai, MD Questionnaire Submission      Y  12/07/2013  4:31 PM Lupita Leash, MD Tami Skinner Questionnaire      DEL 11/30/2013 12:32 AM Generic Mychart Tami Skinner Appointment Reminder             Created by      Encounter creation information not available              Orders Placed This Encounter      AMB REFERRAL FOR DME [[id number] Custom]          Future Labs/Procedures Expected by Expires        CT Chest Wo Contrast [IMG200 Custom] As directed 02/05/2015                 Visit Pharmacy      [facility] - [place], [place] - [address]

## 2013-12-26 NOTE — Telephone Encounter (Signed)
Not sure why insurance denied it because she needed it for monitoring the nodule.  Please let her know that I want to appeal

## 2013-12-26 NOTE — Telephone Encounter (Signed)
I called and spoke w/ pt. She reports she received a notice her CT was denied. She was told this was approved by us. Will speak with PCC's. Please advise PCC's thanks  Also pt requesting CT results. Please advise BQ. thanks

## 2013-12-27 ENCOUNTER — Telehealth: Payer: Self-pay

## 2013-12-27 NOTE — Telephone Encounter (Signed)
Message copied by Velvet BatheAULFIELD, ASHLEY L on Tue Dec 27, 2013  1:13 PM ------      Message from: Lupita LeashMCQUAID, DOUGLAS B      Created: Tue Dec 27, 2013 11:34 AM       A,                  Please let her know that her CT from FEbruary looked great, the nodule hadn't changed.            Thanks      B ------

## 2013-12-27 NOTE — Telephone Encounter (Signed)
Pt aware of ct chest results, nothing further needed. 

## 2013-12-28 NOTE — Telephone Encounter (Signed)
Dr Kendrick Friesmcquaid notified how to do this peer to peer for this appeal pt aware Tami Skinner

## 2013-12-28 NOTE — Telephone Encounter (Signed)
[id number] to advise the pt of the appeal process. Carron CurieJennifer Castillo, CMA

## 2013-12-28 NOTE — Telephone Encounter (Signed)
Please note that I have already done the peer to peer on 2/24 and it was denied, see my note previously regarding this

## 2013-12-28 NOTE — Telephone Encounter (Signed)
i called and spoke with pt made her aware. Will forward to Dr. Kendrick FriesMcQuaid

## 2013-12-30 MED ORDER — FLUTICASONE FUROATE-VILANTEROL 100-25 MCG/INH IN AEPB: 1.0000 | INHALATION_SPRAY | Freq: Every day | RESPIRATORY_TRACT | Status: DC

## 2013-12-30 NOTE — Telephone Encounter (Signed)
Spoke with the pt and notified of recs per Dr Kendrick FriesMcQuaid  She verbalized understanding  She states that she feels that the Tami Skinner helps more than advair and requests that this be called in  This was done and nothing further needed

## 2013-12-30 NOTE — Telephone Encounter (Signed)
Please let her know that the nodule has not changed This is good

## 2014-01-03 ENCOUNTER — Telehealth: Payer: Self-pay | Admitting: Pulmonary Disease

## 2014-01-03 NOTE — Telephone Encounter (Signed)
Pt is inquiring about her Breo prescription. She was given the savings card for 1 year supply for free. Advised her that unfortunately, this card doesn't apply to Medicare and Medicaid. Pt has Medicare for insurance. Nothing further was needed per the pt.

## 2014-01-09 ENCOUNTER — Ambulatory Visit: Payer: Self-pay | Admitting: Ophthalmology

## 2014-01-16 ENCOUNTER — Encounter: Payer: Self-pay | Admitting: Pulmonary Disease

## 2014-01-24 ENCOUNTER — Encounter: Payer: Self-pay | Admitting: Pulmonary Disease

## 2014-01-25 ENCOUNTER — Encounter: Payer: Self-pay | Admitting: Internal Medicine

## 2014-02-21 ENCOUNTER — Other Ambulatory Visit: Payer: Self-pay | Admitting: Internal Medicine

## 2014-02-27 ENCOUNTER — Other Ambulatory Visit: Payer: Self-pay | Admitting: *Deleted

## 2014-02-27 MED ORDER — ALPRAZOLAM 0.5 MG PO TABS: ORAL_TABLET | ORAL | Status: DC

## 2014-02-27 NOTE — Telephone Encounter (Signed)
Electronic Rx request please advise. 

## 2014-02-28 ENCOUNTER — Telehealth: Payer: Self-pay | Admitting: *Deleted

## 2014-02-28 NOTE — Telephone Encounter (Signed)
Xanax Rx faxed to pharmacy  

## 2014-03-06 ENCOUNTER — Encounter: Payer: Self-pay | Admitting: Pulmonary Disease

## 2014-03-07 ENCOUNTER — Encounter: Payer: Self-pay | Admitting: Internal Medicine

## 2014-03-07 MED ORDER — ALBUTEROL SULFATE HFA 108 (90 BASE) MCG/ACT IN AERS: 2.0000 | INHALATION_SPRAY | RESPIRATORY_TRACT | Status: DC | PRN

## 2014-03-23 ENCOUNTER — Other Ambulatory Visit: Payer: Self-pay | Admitting: Internal Medicine

## 2014-03-23 NOTE — Telephone Encounter (Signed)
Appt 04/18/14 

## 2014-04-11 ENCOUNTER — Encounter: Payer: Self-pay | Admitting: Internal Medicine

## 2014-04-18 ENCOUNTER — Ambulatory Visit: Payer: Medicare Other | Admitting: Internal Medicine

## 2014-04-26 ENCOUNTER — Other Ambulatory Visit: Payer: Self-pay | Admitting: Internal Medicine

## 2014-05-09 ENCOUNTER — Other Ambulatory Visit: Payer: Self-pay | Admitting: Internal Medicine

## 2014-05-19 ENCOUNTER — Encounter: Payer: Self-pay | Admitting: Internal Medicine

## 2014-05-19 ENCOUNTER — Ambulatory Visit (INDEPENDENT_AMBULATORY_CARE_PROVIDER_SITE_OTHER): Payer: Medicare Other | Admitting: Internal Medicine

## 2014-05-19 VITALS — BP 122/70 | HR 76 | Temp 97.8°F | Ht 65.0 in | Wt 157.5 lb

## 2014-05-19 DIAGNOSIS — I10 Essential (primary) hypertension: Secondary | ICD-10-CM

## 2014-05-19 DIAGNOSIS — Z23 Encounter for immunization: Secondary | ICD-10-CM

## 2014-05-19 DIAGNOSIS — E119 Type 2 diabetes mellitus without complications: Secondary | ICD-10-CM

## 2014-05-19 DIAGNOSIS — E559 Vitamin D deficiency, unspecified: Secondary | ICD-10-CM

## 2014-05-19 DIAGNOSIS — R5383 Other fatigue: Secondary | ICD-10-CM

## 2014-05-19 DIAGNOSIS — R5381 Other malaise: Secondary | ICD-10-CM

## 2014-05-19 LAB — VITAMIN B12: VITAMIN B 12: 281 pg/mL (ref 211–911)

## 2014-05-19 LAB — LIPID PANEL
CHOLESTEROL: 219 mg/dL — AB (ref 0–200)
HDL: 64.5 mg/dL (ref >39.00)
LDL Cholesterol: 139 mg/dL — ABNORMAL HIGH (ref 0–99)
NonHDL: 154.5
Total CHOL/HDL Ratio: 3
Triglycerides: 79 mg/dL (ref 0.0–149.0)
VLDL: 15.8 mg/dL (ref 0.0–40.0)

## 2014-05-19 LAB — MICROALBUMIN / CREATININE URINE RATIO
CREATININE, U: 12.2 mg/dL
MICROALB UR: 0.1 mg/dL (ref 0.0–1.9)
Microalb Creat Ratio: 0.8 mg/g (ref 0.0–30.0)

## 2014-05-19 LAB — COMPREHENSIVE METABOLIC PANEL
ALBUMIN: 4 g/dL (ref 3.5–5.2)
ALK PHOS: 52 U/L (ref 39–117)
ALT: 13 U/L (ref 0–35)
AST: 18 U/L (ref 0–37)
BUN: 17 mg/dL (ref 6–23)
CO2: 29 mEq/L (ref 19–32)
Calcium: 9.8 mg/dL (ref 8.4–10.5)
Chloride: 102 mEq/L (ref 96–112)
Creatinine, Ser: 0.7 mg/dL (ref 0.4–1.2)
GFR: 86.83 mL/min (ref >60.00)
Glucose, Bld: 112 mg/dL — ABNORMAL HIGH (ref 70–99)
POTASSIUM: 4.4 meq/L (ref 3.5–5.1)
SODIUM: 138 meq/L (ref 135–145)
TOTAL PROTEIN: 6.5 g/dL (ref 6.0–8.3)
Total Bilirubin: 0.3 mg/dL (ref 0.2–1.2)

## 2014-05-19 LAB — HEMOGLOBIN A1C: HEMOGLOBIN A1C: 6.6 % — AB (ref 4.6–6.5)

## 2014-05-19 LAB — VITAMIN D 25 HYDROXY (VIT D DEFICIENCY, FRACTURES): VITD: 48.05 ng/mL

## 2014-05-19 LAB — TSH: TSH: 2.7 u[IU]/mL (ref 0.35–4.50)

## 2014-05-19 NOTE — Assessment & Plan Note (Signed)
Will check A1c with labs today. Continue Metformin. Foot exam normal today. 

## 2014-05-19 NOTE — Progress Notes (Signed)
Subjective:    Patient ID: Daivd CouncilMary H Breeland, female    DOB: 08/26/1946, 68 y.o.   MRN: [id number]  HPI 68YO female presents for follow up.  DM - BG have been well controlled by report. Did not bring record today. Compliant with meds.  Concerned about recent generalized fatigue. Questions if this may be secondary to Vit D deficiency. Has been taking Vit D 2000units daily. No focal symptoms noted.  Review of Systems  Constitutional: Positive for fatigue. Negative for fever, chills, appetite change and unexpected weight change.  Eyes: Negative for visual disturbance.  Respiratory: Negative for cough and shortness of breath.   Cardiovascular: Negative for chest pain and leg swelling.  Gastrointestinal: Negative for nausea, vomiting, abdominal pain, diarrhea and constipation.  Musculoskeletal: Negative for arthralgias and myalgias.  Skin: Negative for color change and rash.  Hematological: Negative for adenopathy. Does not bruise/bleed easily.  Psychiatric/Behavioral: Negative for dysphoric mood. The patient is not nervous/anxious.        Objective:    BP 122/70  Pulse 76  Temp(Src) 97.8 F (36.6 C) (Oral)  Ht 5\' 5"  (1.651 m)  Wt 157 lb 8 oz (71.442 kg)  BMI 26.21 kg/m2  SpO2 93% Physical Exam  Constitutional: She is oriented to person, place, and time. She appears well-developed and well-nourished. No distress.  HENT:  Head: Normocephalic and atraumatic.  Right Ear: External ear normal.  Left Ear: External ear normal.  Nose: Nose normal.  Mouth/Throat: Oropharynx is clear and moist. No oropharyngeal exudate.  Eyes: Conjunctivae are normal. Pupils are equal, round, and reactive to light. Right eye exhibits no discharge. Left eye exhibits no discharge. No scleral icterus.  Neck: Normal range of motion. Neck supple. No tracheal deviation present. No thyromegaly present.  Cardiovascular: Normal rate, regular rhythm, normal heart sounds and intact distal pulses.  Exam reveals no  gallop and no friction rub.   No murmur heard. Pulmonary/Chest: Effort normal and breath sounds normal. No accessory muscle usage. Not tachypneic. No respiratory distress. She has no decreased breath sounds. She has no wheezes. She has no rhonchi. She has no rales. She exhibits no tenderness.  Musculoskeletal: Normal range of motion. She exhibits no edema and no tenderness.  Lymphadenopathy:    She has no cervical adenopathy.  Neurological: She is alert and oriented to person, place, and time. No cranial nerve deficit. She exhibits normal muscle tone. Coordination normal.  Skin: Skin is warm and dry. No rash noted. She is not diaphoretic. No erythema. No pallor.  Psychiatric: She has a normal mood and affect. Her behavior is normal. Judgment and thought content normal.          Assessment & Plan:   Problem List Items Addressed This Visit     Unprioritized   Diabetes mellitus type 2, controlled - Primary     Will check A1c with labs today. Continue Metformin. Foot exam normal today.    Relevant Orders      Comprehensive metabolic panel      Hemoglobin A1c      Lipid panel      Microalbumin / creatinine urine ratio   Essential hypertension, benign      BP Readings from Last 3 Encounters:  05/19/14 122/70  12/14/13 120/80  12/07/13 126/86   BP well controlled on Diovan. Will check renal function with labs.    Other malaise and fatigue     Generalized fatigue noted by pt. Will check labs CBC, CMP, TSH, B12, and Vit  d.    Relevant Orders      TSH      B12   Unspecified vitamin D deficiency   Relevant Orders      Vitamin D (25 hydroxy)    Other Visit Diagnoses   Need for prophylactic vaccination against Streptococcus pneumoniae (pneumococcus)        Relevant Orders       Pneumococcal conjugate vaccine 13-valent (Completed)        Return in about 3 months (around 08/19/2014) for Wellness Visit.

## 2014-05-19 NOTE — Progress Notes (Signed)
Pre visit review using our clinic review tool, if applicable. No additional management support is needed unless otherwise documented below in the visit note. 

## 2014-05-19 NOTE — Assessment & Plan Note (Signed)
Generalized fatigue noted by pt. Will check labs CBC, CMP, TSH, B12, and Vit d.

## 2014-05-19 NOTE — Patient Instructions (Signed)
Labs today.  Prevnar vaccine today.  Follow up in 3 months or sooner as needed.

## 2014-05-19 NOTE — Assessment & Plan Note (Signed)
BP Readings from Last 3 Encounters:  05/19/14 122/70  12/14/13 120/80  12/07/13 126/86   BP well controlled on Diovan. Will check renal function with labs.

## 2014-05-20 ENCOUNTER — Telehealth: Payer: Self-pay | Admitting: Internal Medicine

## 2014-05-20 NOTE — Telephone Encounter (Signed)
Relevant patient education assigned to patient using Emmi. ° °

## 2014-05-22 ENCOUNTER — Other Ambulatory Visit: Payer: Self-pay | Admitting: Internal Medicine

## 2014-05-22 ENCOUNTER — Encounter: Payer: Self-pay | Admitting: Internal Medicine

## 2014-05-22 MED ORDER — ROSUVASTATIN CALCIUM 5 MG PO TABS: 5.0000 mg | ORAL_TABLET | Freq: Every day | ORAL | Status: DC

## 2014-06-09 ENCOUNTER — Other Ambulatory Visit: Payer: Self-pay | Admitting: Internal Medicine

## 2014-06-26 ENCOUNTER — Other Ambulatory Visit: Payer: Self-pay | Admitting: Internal Medicine

## 2014-07-05 ENCOUNTER — Other Ambulatory Visit: Payer: Self-pay | Admitting: Internal Medicine

## 2014-08-05 ENCOUNTER — Other Ambulatory Visit: Payer: Self-pay | Admitting: Internal Medicine

## 2014-08-09 ENCOUNTER — Encounter: Payer: Self-pay | Admitting: Internal Medicine

## 2014-08-09 ENCOUNTER — Other Ambulatory Visit: Payer: Self-pay | Admitting: Internal Medicine

## 2014-08-09 ENCOUNTER — Ambulatory Visit (INDEPENDENT_AMBULATORY_CARE_PROVIDER_SITE_OTHER): Payer: Medicare Other | Admitting: Internal Medicine

## 2014-08-09 ENCOUNTER — Ambulatory Visit (INDEPENDENT_AMBULATORY_CARE_PROVIDER_SITE_OTHER)
Admission: RE | Admit: 2014-08-09 | Discharge: 2014-08-09 | Disposition: A | Discharge status: Home / Self Care | Payer: Medicare Other | Source: Ambulatory Visit | Attending: Internal Medicine | Admitting: Internal Medicine

## 2014-08-09 VITALS — BP 104/66 | HR 104 | Temp 98.1°F | Wt 155.0 lb

## 2014-08-09 DIAGNOSIS — M25562 Pain in left knee: Secondary | ICD-10-CM

## 2014-08-09 DIAGNOSIS — Z23 Encounter for immunization: Secondary | ICD-10-CM

## 2014-08-09 DIAGNOSIS — M25462 Effusion, left knee: Secondary | ICD-10-CM

## 2014-08-09 MED ORDER — MELOXICAM 15 MG PO TABS: 15.0000 mg | ORAL_TABLET | Freq: Every day | ORAL | Status: DC

## 2014-08-09 NOTE — Progress Notes (Signed)
Subjective:    Patient ID: Tami Skinner, female    DOB: 1946-09-29, 68 y.o.   MRN: [id number]  HPI  Pt presents to the clinic today with c/o left knee pain and swelling. She reports this started 3 days ago. The pain is sharp. She did have a sensation last night that her foot was asleep. She denies any injury to the area. She has tried aspirin without much relief. She reports that she has not had any recent change in her activity level.  Additionally, she would like a flu shot today.  Review of Systems      Past Medical History  Diagnosis Date  . COPD (chronic obstructive pulmonary disease)   . Diverticulitis   . Diabetes mellitus   . S/P colonoscopy 2010    Dr. Allen Norris  . Hypotension   . Strep throat   . Diarrhea   . Weakness   . Bronchitis, acute, with bronchospasm   . Asthmatic bronchitis   . Constipation, chronic   . Sleep apnea   . Facial pain   . Fibrocystic breast disease   . Neck pain   . Anxiety   . Osteopenia   . Hyperlipidemia   . Diverticulosis     Current Outpatient Prescriptions  Medication Sig Dispense Refill  . ADVAIR DISKUS 250-50 MCG/DOSE AEPB INHALE 1 PUFF INTO LUNGS TWICE DAILY  60 each  5  . albuterol (PROAIR HFA) 108 (90 BASE) MCG/ACT inhaler Inhale 2 puffs into the lungs every 4 (four) hours as needed for wheezing.  1 Inhaler  2  . albuterol (PROVENTIL) (2.5 MG/3ML) 0.083% nebulizer solution Take 3 mLs (2.5 mg total) by nebulization every 6 (six) hours as needed for wheezing or shortness of breath.  75 mL  12  . ALPRAZolam (XANAX) 0.5 MG tablet TAKE 1 TABLET TWICE DAILY AS NEEDED FOR SLEEP. TAKE 1/2 TABLET IN THE MORNING AND 1 TABLET AT BEDTIME  60 tablet  2  . cetirizine (ZYRTEC) 10 MG tablet Take 10 mg by mouth daily.      . Cholecalciferol (VITAMIN D-3) 1000 UNITS CAPS Take 2 capsules by mouth.       . cyclobenzaprine (FLEXERIL) 5 MG tablet TAKE 1 TABLET (5 MG TOTAL) BY MOUTH 3 (THREE) TIMES DAILY AS NEEDED.  30 tablet  0  . DIOVAN 160 MG  tablet TAKE 1 TABLET DAILY  30 tablet  5  . DIOVAN 160 MG tablet TAKE 1 TABLET DAILY  30 tablet  6  . fluticasone (FLONASE) 50 MCG/ACT nasal spray Place 2 sprays into the nose daily.  1 g  0  . Fluticasone Furoate-Vilanterol (BREO ELLIPTA) 100-25 MCG/INH AEPB Inhale 1 puff into the lungs daily.  30 each  2  . glucose blood (ONE TOUCH ULTRA TEST) test strip 1 each by Other route 2 (two) times daily as needed for other. Use as instructed  100 each  2  . metFORMIN (GLUCOPHAGE) 500 MG tablet TAKE 1 TABLET (500 MG TOTAL) BY MOUTH 2 (TWO) TIMES DAILY WITH A MEAL.  60 tablet  4  . nicotine (NICOTROL) 10 MG inhaler Inhale 1 puff into the lungs as needed for smoking cessation.      . OXYGEN-HELIUM IN Inhale 2 % into the lungs at bedtime.        . rosuvastatin (CRESTOR) 5 MG tablet Take 1 tablet (5 mg total) by mouth daily.  90 tablet  3  . sertraline (ZOLOFT) 50 MG tablet TAKE 1 TABLET (50 MG  TOTAL) BY MOUTH 2 (TWO) TIMES DAILY.  60 tablet  3  . SPIRIVA HANDIHALER 18 MCG inhalation capsule USE 1 CAPSULE IN INHALER DAILY  90 capsule  0   No current facility-administered medications for this visit.    Allergies  Allergen Reactions  . Amoxicillin-Pot Clavulanate     REACTION: stomach sensitive.  Newton Pigg [Roflumilast]     Severe nausea   . Erythromycin Base     REACTION: GI upset    Family History  Problem Relation Age of Onset  . Diabetes Daughter   . Kidney disease Daughter   . Cancer Maternal Aunt     breast  . Diabetes Maternal Grandmother   . Cancer Cousin     colon  . Lung cancer Mother     smoked  . COPD Father     smoked  . COPD Mother     smoked    History   Social History  . Marital Status: Married    Spouse Name: N/A    Number of Children: N/A  . Years of Education: N/A   Occupational History  . Not on file.   Social History Main Topics  . Smoking status: Former Smoker -- 0.25 packs/day for 40 years    Types: Cigarettes    Quit date: 11/24/2012  . Smokeless  tobacco: Never Used     Comment: Uses nicotrol inhaler on occasion  . Alcohol Use: No  . Drug Use: No  . Sexual Activity: Not on file   Other Topics Concern  . Not on file   Social History Narrative   Lives with husband in [place]. 2 dogs. Works at [employer].     Constitutional: Denies fever, malaise, fatigue, headache or abrupt weight changes.  Musculoskeletal: Pt reports left knee pain and swelling. Denies decrease in range of motion, difficulty with gait, muscle pain.    No other specific complaints in a complete review of systems (except as listed in HPI above).  Objective:   Physical Exam BP 104/66  Pulse 104  Temp(Src) 98.1 F (36.7 C) (Oral)  Wt 155 lb (70.308 kg)  SpO2 95% Wt Readings from Last 3 Encounters:  08/09/14 155 lb (70.308 kg)  05/19/14 157 lb 8 oz (71.442 kg)  12/14/13 162 lb (73.483 kg)    General: Appears her stated age, well developed, well nourished in NAD. Cardiovascular: Normal rate and rhythm. S1,S2 noted.  No murmur, rubs or gallops noted. Left pedal pulse intact. Pulmonary/Chest: Normal effort and positive vesicular breath sounds. No respiratory distress. No wheezes, rales or ronchi noted.  Musculoskeletal: Normal flexion and extension of the left knee. Crepitus noted with ROM. Lateral swelling of the knee noted. Tender to palpation with the lateral joint line.    BMET    Component Value Date/Time   NA 138 05/19/2014 1004   K 4.4 05/19/2014 1004   CL 102 05/19/2014 1004   CO2 29 05/19/2014 1004   GLUCOSE 112* 05/19/2014 1004   BUN 17 05/19/2014 1004   CREATININE 0.7 05/19/2014 1004   CREATININE TEST NOT PERFORMED 11/24/2011 0000   CALCIUM 9.8 05/19/2014 1004   GFRNONAA >60 12/30/2007 0415   GFRAA  Value: >60        The eGFR has been calculated using the MDRD equation. This calculation has not been validated in all clinical 12/30/2007 0415    Lipid Panel     Component Value Date/Time   CHOL 219* 05/19/2014 1004   TRIG 79.0 05/19/2014 1004  HDL  64.50 05/19/2014 1004   CHOLHDL 3 05/19/2014 1004   VLDL 15.8 05/19/2014 1004   LDLCALC 139* 05/19/2014 1004    CBC    Component Value Date/Time   WBC 6.4 10/11/2013 1102   RBC 4.60 10/11/2013 1102   HGB 14.0 10/11/2013 1102   HCT 42.7 10/11/2013 1102   PLT 303.0 10/11/2013 1102   MCV 92.8 10/11/2013 1102   MCHC 32.8 10/11/2013 1102   RDW 14.8* 10/11/2013 1102   LYMPHSABS 1.7 10/11/2013 1102   MONOABS 0.4 10/11/2013 1102   EOSABS 0.1 10/11/2013 1102   BASOSABS 0.0 10/11/2013 1102    Hgb A1C Lab Results  Component Value Date   HGBA1C 6.6* 05/19/2014         Assessment & Plan:   Left Knee pain and swelling:  ? Arthritis Will check xray of left knee If degenerative changes noted, will send in RX for mobic 15 mg daily Ice may be helpful  Will follow up with you after xray, RTC as needed

## 2014-08-09 NOTE — Progress Notes (Signed)
Pre visit review using our clinic review tool, if applicable. No additional management support is needed unless otherwise documented below in the visit note. 

## 2014-08-09 NOTE — Addendum Note (Signed)
Addended by: Roena MaladyEVONTENNO, MELANIE Y on: 08/09/2014 04:54 PM   Modules accepted: Orders

## 2014-08-09 NOTE — Patient Instructions (Signed)

## 2014-08-11 ENCOUNTER — Telehealth: Payer: Self-pay | Admitting: *Deleted

## 2014-08-11 MED ORDER — ALPRAZOLAM 0.5 MG PO TABS: ORAL_TABLET | ORAL | Status: DC

## 2014-08-11 NOTE — Telephone Encounter (Signed)
Fine to refill 

## 2014-08-11 NOTE — Telephone Encounter (Signed)
Fax from pharmacy requesting Alprazolam 0.5mg  tab.  Last refill 9.3.15.  Please advise refill

## 2014-08-21 ENCOUNTER — Encounter: Payer: Self-pay | Admitting: Internal Medicine

## 2014-08-21 MED ORDER — ROSUVASTATIN CALCIUM 5 MG PO TABS: 5.0000 mg | ORAL_TABLET | Freq: Every day | ORAL | Status: DC

## 2014-08-21 MED ORDER — TIOTROPIUM BROMIDE MONOHYDRATE 18 MCG IN CAPS: ORAL_CAPSULE | RESPIRATORY_TRACT | Status: DC

## 2014-09-02 ENCOUNTER — Telehealth: Payer: Self-pay

## 2014-09-02 NOTE — Telephone Encounter (Signed)
LVM for pt to call back.   RE: scheduling AWV with NP or PA if pt allows.  

## 2014-09-05 ENCOUNTER — Encounter: Payer: Self-pay | Admitting: Internal Medicine

## 2014-09-05 DIAGNOSIS — Z1211 Encounter for screening for malignant neoplasm of colon: Secondary | ICD-10-CM

## 2014-09-12 ENCOUNTER — Encounter: Payer: Self-pay | Admitting: Internal Medicine

## 2014-09-12 ENCOUNTER — Ambulatory Visit (INDEPENDENT_AMBULATORY_CARE_PROVIDER_SITE_OTHER): Payer: Medicare Other | Admitting: Internal Medicine

## 2014-09-12 VITALS — BP 138/82 | HR 88 | Temp 98.4°F | Ht 65.0 in | Wt 157.0 lb

## 2014-09-12 DIAGNOSIS — H6693 Otitis media, unspecified, bilateral: Secondary | ICD-10-CM

## 2014-09-12 DIAGNOSIS — J209 Acute bronchitis, unspecified: Secondary | ICD-10-CM

## 2014-09-12 DIAGNOSIS — H6690 Otitis media, unspecified, unspecified ear: Secondary | ICD-10-CM | POA: Insufficient documentation

## 2014-09-12 MED ORDER — PREDNISONE 10 MG PO TABS: ORAL_TABLET | ORAL | Status: DC

## 2014-09-12 MED ORDER — HYDROCOD POLST-CHLORPHEN POLST 10-8 MG/5ML PO LQCR: 5.0000 mL | Freq: Two times a day (BID) | ORAL | Status: DC | PRN

## 2014-09-12 MED ORDER — ALPRAZOLAM 0.5 MG PO TABS: ORAL_TABLET | ORAL | Status: DC

## 2014-09-12 MED ORDER — LEVOFLOXACIN 500 MG PO TABS: 500.0000 mg | ORAL_TABLET | Freq: Every day | ORAL | Status: DC

## 2014-09-12 NOTE — Assessment & Plan Note (Signed)
Bilateral OM. Will start Levaquin. Follow up for recheck in 3 weeks and prn.

## 2014-09-12 NOTE — Patient Instructions (Addendum)
Start Levaquin 500mg  daily.  Start Prednisone taper.  Use Tussionex for cough as needed.  Follow up 3 weeks.

## 2014-09-12 NOTE — Assessment & Plan Note (Signed)
Symptoms and exam c/w early bronchitis. Will start Levaquin and Prednisone. Tussionex prn cough. Follow up in 3 weeks and sooner as needed.

## 2014-09-12 NOTE — Progress Notes (Signed)
Subjective:    Patient ID: Tami Skinner, female    DOB: 01/27/1946, 68 y.o.   MRN: [id number]  HPI 68YO female presents for acute visit.  Developed congestion, cough, sore throat, headache last Wednesday. No fever, chills. Feels short of breath. Cough productive of dark yellow sputum. Taking some OTC sinus medication. No real improvement with this.  History of COPD.  Review of Systems  Constitutional: Positive for fatigue. Negative for fever, chills and unexpected weight change.  HENT: Positive for congestion, postnasal drip and sinus pressure. Negative for ear discharge, ear pain, facial swelling, hearing loss, mouth sores, nosebleeds, rhinorrhea, sneezing, sore throat, tinnitus, trouble swallowing and voice change.   Eyes: Negative for pain, discharge, redness and visual disturbance.  Respiratory: Positive for cough and shortness of breath. Negative for chest tightness, wheezing and stridor.   Cardiovascular: Negative for chest pain, palpitations and leg swelling.  Musculoskeletal: Negative for myalgias, arthralgias, neck pain and neck stiffness.  Skin: Negative for color change and rash.  Neurological: Negative for dizziness, weakness, light-headedness and headaches.  Hematological: Positive for adenopathy.       Objective:    BP 138/82 mmHg  Pulse 88  Temp(Src) 98.4 F (36.9 C) (Oral)  Ht 5\' 5"  (1.651 m)  Wt 157 lb (71.215 kg)  BMI 26.13 kg/m2  SpO2 93% Physical Exam  Constitutional: She is oriented to person, place, and time. She appears well-developed and well-nourished. No distress.  HENT:  Head: Normocephalic and atraumatic.  Right Ear: External ear normal. Tympanic membrane is erythematous and bulging. A middle ear effusion is present.  Left Ear: External ear normal. Tympanic membrane is erythematous and bulging. A middle ear effusion is present.  Nose: Nose normal.  Mouth/Throat: Oropharynx is clear and moist. No oropharyngeal exudate.  Eyes: Conjunctivae are  normal. Pupils are equal, round, and reactive to light. Right eye exhibits no discharge. Left eye exhibits no discharge. No scleral icterus.  Neck: Normal range of motion. Neck supple. No tracheal deviation present. No thyromegaly present.  Cardiovascular: Normal rate, regular rhythm, normal heart sounds and intact distal pulses.  Exam reveals no gallop and no friction rub.   No murmur heard. Pulmonary/Chest: Effort normal. No accessory muscle usage. No tachypnea. No respiratory distress. She has no decreased breath sounds. She has no wheezes. She has rhonchi (scattered). She has no rales. She exhibits no tenderness.  Musculoskeletal: Normal range of motion. She exhibits no edema or tenderness.  Lymphadenopathy:    She has no cervical adenopathy.  Neurological: She is alert and oriented to person, place, and time. No cranial nerve deficit. She exhibits normal muscle tone. Coordination normal.  Skin: Skin is warm and dry. No rash noted. She is not diaphoretic. No erythema. No pallor.  Psychiatric: She has a normal mood and affect. Her behavior is normal. Judgment and thought content normal.          Assessment & Plan:   Problem List Items Addressed This Visit      Unprioritized   Acute bronchitis    Symptoms and exam c/w early bronchitis. Will start Levaquin and Prednisone. Tussionex prn cough. Follow up in 3 weeks and sooner as needed.    Relevant Medications      levofloxacin (LEVAQUIN) tablet      predniSONE (DELTASONE) tablet   Otitis media - Primary    Bilateral OM. Will start Levaquin. Follow up for recheck in 3 weeks and prn.    Relevant Medications      levofloxacin (  LEVAQUIN) tablet       Return in about 3 weeks (around 10/03/2014) for Recheck.

## 2014-09-12 NOTE — Progress Notes (Signed)
Pre visit review using our clinic review tool, if applicable. No additional management support is needed unless otherwise documented below in the visit note. 

## 2014-09-14 ENCOUNTER — Ambulatory Visit: Payer: Self-pay | Admitting: Internal Medicine

## 2014-09-27 ENCOUNTER — Encounter: Payer: Self-pay | Admitting: Internal Medicine

## 2014-09-27 ENCOUNTER — Inpatient Hospital Stay: Payer: Self-pay | Admitting: Internal Medicine

## 2014-09-27 LAB — CBC
HCT: 48 % — AB (ref 35.0–47.0)
HGB: 15.4 g/dL (ref 12.0–16.0)
MCH: 30.1 pg (ref 26.0–34.0)
MCHC: 32.1 g/dL (ref 32.0–36.0)
MCV: 94 fL (ref 80–100)
Platelet: 282 10*3/uL (ref 150–440)
RBC: 5.12 10*6/uL (ref 3.80–5.20)
RDW: 14.5 % (ref 11.5–14.5)
WBC: 17.2 10*3/uL — ABNORMAL HIGH (ref 3.6–11.0)

## 2014-09-27 LAB — TROPONIN I
TROPONIN-I: 0.83 ng/mL — AB
Troponin-I: 0.67 ng/mL — ABNORMAL HIGH

## 2014-09-27 LAB — URINALYSIS, COMPLETE
BACTERIA: NONE SEEN
Bilirubin,UR: NEGATIVE
GLUCOSE, UR: NEGATIVE mg/dL (ref 0–75)
Leukocyte Esterase: NEGATIVE
NITRITE: NEGATIVE
Ph: 5 (ref 4.5–8.0)
Protein: NEGATIVE
RBC,UR: 10 /HPF (ref 0–5)
Specific Gravity: 1.017 (ref 1.003–1.030)

## 2014-09-27 LAB — BASIC METABOLIC PANEL
Anion Gap: 6 — ABNORMAL LOW (ref 7–16)
BUN: 13 mg/dL (ref 7–18)
CO2: 27 mmol/L (ref 21–32)
Calcium, Total: 9.3 mg/dL (ref 8.5–10.1)
Chloride: 102 mmol/L (ref 98–107)
Creatinine: 0.79 mg/dL (ref 0.60–1.30)
EGFR (Non-African Amer.): >60
GLUCOSE: 141 mg/dL — AB (ref 65–99)
Osmolality: 273 (ref 275–301)
POTASSIUM: 4.3 mmol/L (ref 3.5–5.1)
SODIUM: 135 mmol/L — AB (ref 136–145)

## 2014-09-27 LAB — PHOSPHORUS: Phosphorus: 2 mg/dL — ABNORMAL LOW (ref 2.5–4.9)

## 2014-09-27 LAB — PROTIME-INR
INR: 0.9
INR: 1
Prothrombin Time: 12.3 secs (ref 11.5–14.7)
Prothrombin Time: 13.5 secs (ref 11.5–14.7)

## 2014-09-27 LAB — MAGNESIUM: Magnesium: 1.7 mg/dL — ABNORMAL LOW

## 2014-09-27 LAB — CK-MB
CK-MB: 8.3 ng/mL — ABNORMAL HIGH (ref 0.5–3.6)
CK-MB: 6.4 ng/mL — ABNORMAL HIGH (ref 0.5–3.6)

## 2014-09-27 LAB — APTT: Activated PTT: 25.8 secs (ref 23.6–35.9)

## 2014-09-27 LAB — D-DIMER(ARMC): D-DIMER: 763 ng/mL

## 2014-09-28 DIAGNOSIS — I10 Essential (primary) hypertension: Secondary | ICD-10-CM

## 2014-09-28 DIAGNOSIS — E785 Hyperlipidemia, unspecified: Secondary | ICD-10-CM

## 2014-09-28 DIAGNOSIS — I214 Non-ST elevation (NSTEMI) myocardial infarction: Secondary | ICD-10-CM

## 2014-09-28 LAB — BASIC METABOLIC PANEL
ANION GAP: 6 — AB (ref 7–16)
BUN: 13 mg/dL (ref 7–18)
CHLORIDE: 108 mmol/L — AB (ref 98–107)
CO2: 26 mmol/L (ref 21–32)
CREATININE: 0.83 mg/dL (ref 0.60–1.30)
Calcium, Total: 7.7 mg/dL — ABNORMAL LOW (ref 8.5–10.1)
EGFR (African American): >60
GLUCOSE: 117 mg/dL — AB (ref 65–99)
Osmolality: 281 (ref 275–301)
Potassium: 4.3 mmol/L (ref 3.5–5.1)
SODIUM: 140 mmol/L (ref 136–145)

## 2014-09-28 LAB — CBC WITH DIFFERENTIAL/PLATELET
Basophil #: 0 10*3/uL (ref 0.0–0.1)
Basophil %: 0.4 %
Eosinophil #: 0 10*3/uL (ref 0.0–0.7)
Eosinophil %: 0.4 %
HCT: 37.7 % (ref 35.0–47.0)
HGB: 12.3 g/dL (ref 12.0–16.0)
Lymphocyte #: 1.4 10*3/uL (ref 1.0–3.6)
Lymphocyte %: 13.4 %
MCH: 30.7 pg (ref 26.0–34.0)
MCHC: 32.7 g/dL (ref 32.0–36.0)
MCV: 94 fL (ref 80–100)
MONO ABS: 1.4 x10 3/mm — AB (ref 0.2–0.9)
Monocyte %: 13.4 %
NEUTROS PCT: 72.4 %
Neutrophil #: 7.3 10*3/uL — ABNORMAL HIGH (ref 1.4–6.5)
PLATELETS: 216 10*3/uL (ref 150–440)
RBC: 4.02 10*6/uL (ref 3.80–5.20)
RDW: 14.1 % (ref 11.5–14.5)
WBC: 10.1 10*3/uL (ref 3.6–11.0)

## 2014-09-28 LAB — LIPID PANEL
Cholesterol: 110 mg/dL (ref 0–200)
HDL Cholesterol: 54 mg/dL (ref 40–60)
LDL CHOLESTEROL, CALC: 36 mg/dL (ref 0–100)
TRIGLYCERIDES: 102 mg/dL (ref 0–200)
VLDL Cholesterol, Calc: 20 mg/dL (ref 5–40)

## 2014-09-28 LAB — CK-MB: CK-MB: 6.7 ng/mL — AB (ref 0.5–3.6)

## 2014-09-28 LAB — TROPONIN I: Troponin-I: 0.65 ng/mL — ABNORMAL HIGH

## 2014-09-28 LAB — HEPARIN LEVEL (UNFRACTIONATED)
ANTI-XA(UNFRACTIONATED): 0.37 [IU]/mL (ref 0.30–0.70)
Anti-Xa(Unfractionated): 0.19 IU/mL — ABNORMAL LOW (ref 0.30–0.70)

## 2014-09-28 LAB — PHOSPHORUS: Phosphorus: 3 mg/dL (ref 2.5–4.9)

## 2014-09-28 LAB — MAGNESIUM: Magnesium: 1.9 mg/dL

## 2014-09-29 ENCOUNTER — Encounter: Payer: Self-pay | Admitting: Physician Assistant

## 2014-09-29 DIAGNOSIS — I5181 Takotsubo syndrome: Secondary | ICD-10-CM | POA: Insufficient documentation

## 2014-09-29 HISTORY — PX: CARDIAC CATHETERIZATION: SHX172

## 2014-09-29 LAB — CBC WITH DIFFERENTIAL/PLATELET
BASOS ABS: 0 10*3/uL (ref 0.0–0.1)
Basophil %: 0.4 %
Eosinophil #: 0.1 10*3/uL (ref 0.0–0.7)
Eosinophil %: 2.7 %
HCT: 37.6 % (ref 35.0–47.0)
HGB: 12.4 g/dL (ref 12.0–16.0)
Lymphocyte #: 1.4 10*3/uL (ref 1.0–3.6)
Lymphocyte %: 27.3 %
MCH: 30.8 pg (ref 26.0–34.0)
MCHC: 32.8 g/dL (ref 32.0–36.0)
MCV: 94 fL (ref 80–100)
Monocyte #: 0.5 x10 3/mm (ref 0.2–0.9)
Monocyte %: 10.5 %
NEUTROS ABS: 3 10*3/uL (ref 1.4–6.5)
NEUTROS PCT: 59.1 %
PLATELETS: 205 10*3/uL (ref 150–440)
RBC: 4.02 10*6/uL (ref 3.80–5.20)
RDW: 14.3 % (ref 11.5–14.5)
WBC: 5.1 10*3/uL (ref 3.6–11.0)

## 2014-09-29 LAB — URINE CULTURE

## 2014-09-29 LAB — HEPARIN LEVEL (UNFRACTIONATED): Anti-Xa(Unfractionated): 0.32 IU/mL (ref 0.30–0.70)

## 2014-09-30 LAB — CBC WITH DIFFERENTIAL/PLATELET
BASOS ABS: 0 10*3/uL (ref 0.0–0.1)
Basophil %: 0.8 %
Eosinophil #: 0.2 10*3/uL (ref 0.0–0.7)
Eosinophil %: 3.5 %
HCT: 39 % (ref 35.0–47.0)
HGB: 13 g/dL (ref 12.0–16.0)
LYMPHS ABS: 1.3 10*3/uL (ref 1.0–3.6)
LYMPHS PCT: 21.1 %
MCH: 30.9 pg (ref 26.0–34.0)
MCHC: 33.2 g/dL (ref 32.0–36.0)
MCV: 93 fL (ref 80–100)
MONOS PCT: 8.6 %
Monocyte #: 0.5 x10 3/mm (ref 0.2–0.9)
NEUTROS ABS: 4 10*3/uL (ref 1.4–6.5)
Neutrophil %: 66 %
Platelet: 234 10*3/uL (ref 150–440)
RBC: 4.19 10*6/uL (ref 3.80–5.20)
RDW: 14.1 % (ref 11.5–14.5)
WBC: 6.1 10*3/uL (ref 3.6–11.0)

## 2014-10-01 LAB — EXPECTORATED SPUTUM ASSESSMENT W GRAM STAIN, RFLX TO RESP C

## 2014-10-02 ENCOUNTER — Encounter: Payer: Self-pay | Admitting: Cardiovascular Disease

## 2014-10-02 LAB — CULTURE, BLOOD (SINGLE)

## 2014-10-03 ENCOUNTER — Telehealth: Payer: Self-pay | Admitting: *Deleted

## 2014-10-03 NOTE — Telephone Encounter (Signed)
Discharge date: 09/30/14  Transition Care Management Follow-up Telephone Call  How have you been since you were released from the hospital? Pt states "tired"    Do you understand why you were in the hospital? YES   Do you understand the discharge instrcutions? YES  Items Reviewed:  Medications reviewed: YES  Allergies reviewed: NO  Dietary changes reviewed: NO  Referrals reviewed: N/A   Functional Questionnaire:   Activities of Daily Living (ADLs):   She states they are independent in the following:  States they require assistance with the following:    Any transportation issues/concerns?: NO   Any patient concerns? NO   Confirmed importance and date/time of follow-up visits scheduled: YES, Friday 10/06/14 @ 8:00   Confirmed with patient if condition begins to worsen call PCP or go to the ER.  Patient was given the Call-a-Nurse line [phone]: YES

## 2014-10-04 NOTE — Telephone Encounter (Signed)
Per Dr Dan HumphreysWalker, Please add pt to schedule on dec 4th at 8:00 for a hospital follow up

## 2014-10-06 ENCOUNTER — Ambulatory Visit (INDEPENDENT_AMBULATORY_CARE_PROVIDER_SITE_OTHER): Payer: Medicare Other | Admitting: Internal Medicine

## 2014-10-06 ENCOUNTER — Telehealth: Payer: Self-pay | Admitting: Internal Medicine

## 2014-10-06 ENCOUNTER — Encounter: Payer: Self-pay | Admitting: Internal Medicine

## 2014-10-06 VITALS — BP 124/78 | HR 76 | Temp 97.8°F | Wt 154.5 lb

## 2014-10-06 DIAGNOSIS — B029 Zoster without complications: Secondary | ICD-10-CM | POA: Insufficient documentation

## 2014-10-06 DIAGNOSIS — I5181 Takotsubo syndrome: Secondary | ICD-10-CM

## 2014-10-06 DIAGNOSIS — H6533 Chronic mucoid otitis media, bilateral: Secondary | ICD-10-CM

## 2014-10-06 DIAGNOSIS — H6690 Otitis media, unspecified, unspecified ear: Secondary | ICD-10-CM | POA: Insufficient documentation

## 2014-10-06 DIAGNOSIS — I214 Non-ST elevation (NSTEMI) myocardial infarction: Secondary | ICD-10-CM

## 2014-10-06 DIAGNOSIS — F419 Anxiety disorder, unspecified: Secondary | ICD-10-CM

## 2014-10-06 MED ORDER — HYDROCODONE-ACETAMINOPHEN 5-325 MG PO TABS: 1.0000 | ORAL_TABLET | Freq: Three times a day (TID) | ORAL | Status: DC | PRN

## 2014-10-06 MED ORDER — SERTRALINE HCL 50 MG PO TABS: ORAL_TABLET | ORAL | Status: DC

## 2014-10-06 MED ORDER — VALACYCLOVIR HCL 1 G PO TABS: 1000.0000 mg | ORAL_TABLET | Freq: Three times a day (TID) | ORAL | Status: DC

## 2014-10-06 NOTE — Telephone Encounter (Signed)
Please advise 

## 2014-10-06 NOTE — Progress Notes (Signed)
Subjective:    Patient ID: Tami Skinner, female    DOB: 01/09/1946, 68 y.o.   MRN: [id number]  HPI 68YO female presents for hospital follow up.  ADMITTED: 09/27/2014 DISCHARGED: 09/30/2014  DIAGNOSIS: NSTEMI, EF 45%  Developed left sided chest pain on 11/25. Initially went to urgent care then to ED. Found to have NSTEMI. EF 45-50% with severe apical hypokinesis suggestive of stress induced cardiomyopathy. Metoprolol added to regimen.  Since discharge continues to have significant anxiety. Notes that husband is an alcoholic. This is difficult for her. Taking Sertraline with no improvement. No further chest pain. However having right shoulder and neck pain. Notes blistered areas over right shoulder this morning. Very tender to touch. No fever, chills. She did have the shingles vaccine. Not taking anything for this pain except for occasional tylenol. No improvement with this.   Past medical, surgical, family and social history per today's encounter.  Review of Systems  Constitutional: Negative for fever, chills, appetite change, fatigue and unexpected weight change.  Eyes: Negative for visual disturbance.  Respiratory: Negative for shortness of breath.   Cardiovascular: Negative for chest pain, palpitations and leg swelling.  Gastrointestinal: Negative for nausea, abdominal pain and diarrhea.  Musculoskeletal: Positive for myalgias, arthralgias and neck pain.  Skin: Positive for color change, rash and wound.  Hematological: Negative for adenopathy. Does not bruise/bleed easily.  Psychiatric/Behavioral: Positive for sleep disturbance. Negative for suicidal ideas and dysphoric mood. The patient is nervous/anxious.        Objective:    BP 124/78 mmHg  Pulse 76  Temp(Src) 97.8 F (36.6 C) (Oral)  Wt 154 lb 8 oz (70.081 kg)  SpO2 94% Physical Exam  Constitutional: She is oriented to person, place, and time. She appears well-developed and well-nourished. No distress.  HENT:    Head: Normocephalic and atraumatic.  Right Ear: External ear normal.  Left Ear: External ear normal.  Nose: Nose normal.  Mouth/Throat: Oropharynx is clear and moist. No oropharyngeal exudate.  Eyes: Conjunctivae are normal. Pupils are equal, round, and reactive to light. Right eye exhibits no discharge. Left eye exhibits no discharge. No scleral icterus.  Neck: Normal range of motion. Neck supple. No tracheal deviation present. No thyromegaly present.  Cardiovascular: Normal rate, regular rhythm, normal heart sounds and intact distal pulses.  Exam reveals no gallop and no friction rub.   No murmur heard. Pulmonary/Chest: Effort normal and breath sounds normal. No accessory muscle usage. No tachypnea. No respiratory distress. She has no decreased breath sounds. She has no wheezes. She has no rhonchi. She has no rales. She exhibits no tenderness.  Musculoskeletal: Normal range of motion. She exhibits no edema or tenderness.  Lymphadenopathy:    She has no cervical adenopathy.  Neurological: She is alert and oriented to person, place, and time. No cranial nerve deficit. She exhibits normal muscle tone. Coordination normal.  Skin: Skin is warm and dry. Rash noted. Rash is vesicular. She is not diaphoretic. No erythema. No pallor.     Psychiatric: Her speech is normal and behavior is normal. Judgment and thought content normal. Her mood appears anxious. She expresses no suicidal ideation.          Assessment & Plan:   Problem List Items Addressed This Visit      Unprioritized   Anxiety   Relevant Medications      sertraline (ZOLOFT) tablet   Other Relevant Orders      Ambulatory referral to Psychology   Chronic otitis media  Chronic bilateral OM despite recent course of Levaquin. Will set up ENT evaluation.    Relevant Medications      valACYclovir (VALTREX) tablet   Other Relevant Orders      Ambulatory referral to ENT   Herpes zoster    Right shoulder rash most consistent  with herpes zoster. Will start Valtrex and will use Hydrocodone for pain. Follow up Monday.    Relevant Medications      valACYclovir (VALTREX) tablet      HYDROcodone-acetaminophen (NORCO/VICODIN) 5-325 MG per tablet   NSTEMI (non-ST elevated myocardial infarction) - Primary    S/p recent hospitalization for NSTEMI. Reviewed hospitalization with pt. Will continue Metoprolol and add Aspirin. Pt will call if any recurrent symptoms of chest pain.    Relevant Medications      metoprolol succinate (TOPROL-XL) 25 MG 24 hr tablet   Stress-induced cardiomyopathy    Reviewed recent ECHO results. Will continue Metoprolol. Increasing Sertraline to help with anxiety. Follow up in 4 weeks.    Relevant Medications      metoprolol succinate (TOPROL-XL) 25 MG 24 hr tablet       Return in about 3 days (around 10/09/2014) for Recheck.

## 2014-10-06 NOTE — Assessment & Plan Note (Signed)
S/p recent hospitalization for NSTEMI. Reviewed hospitalization with pt. Will continue Metoprolol and add Aspirin. Pt will call if any recurrent symptoms of chest pain.

## 2014-10-06 NOTE — Assessment & Plan Note (Signed)
Right shoulder rash most consistent with herpes zoster. Will start Valtrex and will use Hydrocodone for pain. Follow up Monday.

## 2014-10-06 NOTE — Telephone Encounter (Signed)
Pt needs an appt on Monday or Tuesday for a follow up for shingles. Please advise time to schedule pt/msn

## 2014-10-06 NOTE — Patient Instructions (Signed)
Follow-up in 4 weeks

## 2014-10-06 NOTE — Assessment & Plan Note (Signed)
Chronic bilateral OM despite recent course of Levaquin. Will set up ENT evaluation.

## 2014-10-06 NOTE — Telephone Encounter (Signed)
330pm on Monday  thanks

## 2014-10-06 NOTE — Progress Notes (Signed)
Pre visit review using our clinic review tool, if applicable. No additional management support is needed unless otherwise documented below in the visit note. 

## 2014-10-06 NOTE — Assessment & Plan Note (Signed)
Reviewed recent ECHO results. Will continue Metoprolol. Increasing Sertraline to help with anxiety. Follow up in 4 weeks.

## 2014-10-07 ENCOUNTER — Encounter: Payer: Self-pay | Admitting: Internal Medicine

## 2014-10-09 ENCOUNTER — Ambulatory Visit (INDEPENDENT_AMBULATORY_CARE_PROVIDER_SITE_OTHER): Payer: Medicare Other | Admitting: Internal Medicine

## 2014-10-09 ENCOUNTER — Encounter: Payer: Self-pay | Admitting: Internal Medicine

## 2014-10-09 ENCOUNTER — Telehealth: Payer: Self-pay | Admitting: *Deleted

## 2014-10-09 VITALS — BP 133/83 | HR 80 | Temp 98.2°F | Ht 65.0 in | Wt 155.8 lb

## 2014-10-09 DIAGNOSIS — M542 Cervicalgia: Secondary | ICD-10-CM | POA: Insufficient documentation

## 2014-10-09 DIAGNOSIS — B029 Zoster without complications: Secondary | ICD-10-CM

## 2014-10-09 MED ORDER — GABAPENTIN 100 MG PO CAPS: 100.0000 mg | ORAL_CAPSULE | Freq: Three times a day (TID) | ORAL | Status: DC

## 2014-10-09 MED ORDER — FLUCONAZOLE 150 MG PO TABS: 150.0000 mg | ORAL_TABLET | Freq: Once | ORAL | Status: DC

## 2014-10-09 NOTE — Telephone Encounter (Signed)
Yes, I have called this in.

## 2014-10-09 NOTE — Progress Notes (Signed)
Pre visit review using our clinic review tool, if applicable. No additional management support is needed unless otherwise documented below in the visit note. 

## 2014-10-09 NOTE — Telephone Encounter (Signed)
Pt states that she forgot to ask you for yeast infection medication today during her visit, pt is asking if you can send something to pharmacy for her

## 2014-10-09 NOTE — Progress Notes (Signed)
Subjective:    Patient ID: Tami Skinner, female    DOB: 02/13/1946, 68 y.o.   MRN: [id number]  HPI 68YO female presents for follow up.  Seen last week for follow up after NSTEMI. Found to have shingles right neck.  Seen by ENT. Told her ears looked fine. No infection.  Referred to neurology, at [facility] for evaluation of neck pain.  Continues to have pain at site of blisters right neck and tightness and pain on both sides of neck. Also continues to have headache. Taking hydrocodone with improvement in pain symptoms.   Past medical, surgical, family and social history per today's encounter.  Review of Systems  Constitutional: Negative for fever, chills, appetite change, fatigue and unexpected weight change.  Eyes: Negative for visual disturbance.  Respiratory: Negative for shortness of breath.   Cardiovascular: Negative for chest pain and leg swelling.  Gastrointestinal: Negative for abdominal pain.  Musculoskeletal: Positive for myalgias, arthralgias and neck pain. Negative for neck stiffness.  Skin: Positive for rash and wound. Negative for color change.  Neurological: Negative for weakness.  Hematological: Negative for adenopathy. Does not bruise/bleed easily.  Psychiatric/Behavioral: Negative for dysphoric mood. The patient is not nervous/anxious.        Objective:    BP 133/83 mmHg  Pulse 80  Temp(Src) 98.2 F (36.8 C) (Oral)  Ht 5\' 5"  (1.651 m)  Wt 155 lb 12 oz (70.648 kg)  BMI 25.92 kg/m2  SpO2 94% Physical Exam  Constitutional: She is oriented to person, place, and time. She appears well-developed and well-nourished. No distress.  HENT:  Head: Normocephalic and atraumatic.  Right Ear: External ear normal.  Left Ear: External ear normal.  Nose: Nose normal.  Mouth/Throat: Oropharynx is clear and moist. No oropharyngeal exudate.  Eyes: Conjunctivae are normal. Pupils are equal, round, and reactive to light. Right eye exhibits no discharge. Left eye exhibits no  discharge. No scleral icterus.  Neck: Normal range of motion. Neck supple. No tracheal deviation present. No thyromegaly present.  Cardiovascular: Normal rate, regular rhythm, normal heart sounds and intact distal pulses.  Exam reveals no gallop and no friction rub.   No murmur heard. Pulmonary/Chest: Effort normal and breath sounds normal. No accessory muscle usage. No tachypnea. No respiratory distress. She has no decreased breath sounds. She has no wheezes. She has no rhonchi. She has no rales. She exhibits no tenderness.  Musculoskeletal: Normal range of motion. She exhibits no edema.       Cervical back: She exhibits tenderness, pain and spasm. She exhibits no bony tenderness.  Lymphadenopathy:    She has no cervical adenopathy.  Neurological: She is alert and oriented to person, place, and time. No cranial nerve deficit. She exhibits normal muscle tone. Coordination normal.  Skin: Skin is warm and dry. Rash noted. Rash is vesicular. She is not diaphoretic. There is erythema. No pallor.     Psychiatric: She has a normal mood and affect. Her behavior is normal. Judgment and thought content normal.          Assessment & Plan:   Problem List Items Addressed This Visit      Unprioritized   Cervicalgia    Bilateral neck pain, likely multifactorial. Reviewed MRI cervical spine from 2013, which showed diffuse DJD. She did not follow up with neurosurgery after this evaluation. We discussed potential referral back to neurosurgery. For now, will hold off and have her see Neurology at [facility]. She also has what appears to be herpes zoster  over right neck which is likely contributing to pain. Will start Neurontin 100mg  po tid. Continue Hydrocodone as needed for severe pain. Follow up 1 week.    Herpes zoster - Primary    Blistered lesions have improved. Continues to have severe pain. Will add Neurontin 100mg  po tid. Discussed risk of sedation with this medication. She will call if any  problems. Will also continue Hydrocodone for severe pain. Follow up 1 week and prn.        Return in about 1 week (around 10/16/2014) for Recheck.

## 2014-10-09 NOTE — Assessment & Plan Note (Signed)
Blistered lesions have improved. Continues to have severe pain. Will add Neurontin 100mg  po tid. Discussed risk of sedation with this medication. She will call if any problems. Will also continue Hydrocodone for severe pain. Follow up 1 week and prn.

## 2014-10-09 NOTE — Assessment & Plan Note (Signed)
Bilateral neck pain, likely multifactorial. Reviewed MRI cervical spine from 2013, which showed diffuse DJD. She did not follow up with neurosurgery after this evaluation. We discussed potential referral back to neurosurgery. For now, will hold off and have her see Neurology at [facility]. She also has what appears to be herpes zoster over right neck which is likely contributing to pain. Will start Neurontin 100mg  po tid. Continue Hydrocodone as needed for severe pain. Follow up 1 week.

## 2014-10-09 NOTE — Addendum Note (Signed)
Addended by: Ronna PolioWALKER, Shina Wass A on: 10/09/2014 05:21 PM   Modules accepted: Orders

## 2014-10-09 NOTE — Patient Instructions (Signed)
Start Neurontin 100mg  three times daily to help with nerve pain in neck.  Continue Hydrocodone as needed for severe pain.  Follow up with neurology.

## 2014-10-10 ENCOUNTER — Ambulatory Visit: Payer: Medicare Other | Admitting: Internal Medicine

## 2014-10-10 ENCOUNTER — Other Ambulatory Visit: Payer: Self-pay | Admitting: Internal Medicine

## 2014-10-11 ENCOUNTER — Encounter: Payer: Self-pay | Admitting: *Deleted

## 2014-10-17 ENCOUNTER — Encounter: Payer: Self-pay | Admitting: Cardiovascular Disease

## 2014-10-17 ENCOUNTER — Ambulatory Visit (INDEPENDENT_AMBULATORY_CARE_PROVIDER_SITE_OTHER): Payer: Medicare Other | Admitting: Cardiovascular Disease

## 2014-10-17 VITALS — BP 130/80 | HR 101 | Ht 66.0 in | Wt 156.5 lb

## 2014-10-17 DIAGNOSIS — Z9889 Other specified postprocedural states: Secondary | ICD-10-CM

## 2014-10-17 DIAGNOSIS — I10 Essential (primary) hypertension: Secondary | ICD-10-CM

## 2014-10-17 DIAGNOSIS — I5181 Takotsubo syndrome: Secondary | ICD-10-CM

## 2014-10-17 MED ORDER — METOPROLOL SUCCINATE ER 25 MG PO TB24: 25.0000 mg | ORAL_TABLET | Freq: Every day | ORAL | Status: DC

## 2014-10-17 NOTE — Assessment & Plan Note (Signed)
Blood pressure is borderline and she is somewhat tachycardic. Toprol was resumed.

## 2014-10-17 NOTE — Assessment & Plan Note (Signed)
I resumed treatment with Toprol 25 mg once daily. Continue Diovan. We discussed today the importance of proper treatment of anxiety and stress. She is going to see a psychiatrist. I will plan on repeating her echocardiogram in few months.

## 2014-10-17 NOTE — Progress Notes (Signed)
Primary care physician: Dr. Victorino DikeJennifer Skinner  HPI  This is a pleasant 68 year old female who is here today for a follow-up visit for stress-induced cardiomyopathy. She was hospitalized in November with chest pain and shortness of breath with mildly elevated cardiac enzymes. Cardiac catheterization showed no evidence of obstructive coronary artery disease. Ejection fraction was 45% with wall motion abnormality suggestive of stress-induced cardiomyopathy. She has been under significant stress due to the fact that her husband is an alcoholic. She had shingles last week. She is overall feeling better. She was discharged home on Toprol but has not been taking the medication.  Allergies  Allergen Reactions  . Amoxicillin-Pot Clavulanate     REACTION: stomach sensitive.  Lenice Llamas. Daliresp [Roflumilast]     Severe nausea   . Erythromycin Base     REACTION: GI upset     Current Outpatient Prescriptions on File Prior to Visit  Medication Sig Dispense Refill  . ADVAIR DISKUS 250-50 MCG/DOSE AEPB INHALE 1 PUFF INTO LUNGS TWICE DAILY 60 each 5  . albuterol (PROVENTIL) (2.5 MG/3ML) 0.083% nebulizer solution Take 3 mLs (2.5 mg total) by nebulization every 6 (six) hours as needed for wheezing or shortness of breath. 75 mL 12  . ALPRAZolam (XANAX) 0.5 MG tablet TAKE 1 TABLET TWICE DAILY AS NEEDED FOR SLEEP. TAKE 1/2 TABLET IN THE MORNING AND 1 TABLET AT BEDTIME 60 tablet 3  . cetirizine (ZYRTEC) 10 MG tablet Take 10 mg by mouth daily.    . Cholecalciferol (VITAMIN D-3) 1000 UNITS CAPS Take 2 capsules by mouth.     . cyclobenzaprine (FLEXERIL) 5 MG tablet TAKE 1 TABLET (5 MG TOTAL) BY MOUTH 3 (THREE) TIMES DAILY AS NEEDED. 30 tablet 0  . DIOVAN 160 MG tablet TAKE 1 TABLET DAILY 30 tablet 6  . fluconazole (DIFLUCAN) 150 MG tablet Take 1 tablet (150 mg total) by mouth once. 1 tablet 0  . gabapentin (NEURONTIN) 100 MG capsule Take 1 capsule (100 mg total) by mouth 3 (three) times daily. 90 capsule 3  . glucose blood  (ONE TOUCH ULTRA TEST) test strip 1 each by Other route 2 (two) times daily as needed for other. Use as instructed 100 each 2  . HYDROcodone-acetaminophen (NORCO/VICODIN) 5-325 MG per tablet Take 1 tablet by mouth 3 (three) times daily as needed for moderate pain. 90 tablet 0  . metFORMIN (GLUCOPHAGE) 500 MG tablet TAKE 1 TABLET BY MOUTH TWICE A DAY WITH A MEAL 60 tablet 1  . nicotine (NICOTROL) 10 MG inhaler Inhale 1 puff into the lungs as needed for smoking cessation.    . OXYGEN-HELIUM IN Inhale 2 % into the lungs at bedtime.      . rosuvastatin (CRESTOR) 5 MG tablet Take 1 tablet (5 mg total) by mouth daily. 90 tablet 1  . sertraline (ZOLOFT) 50 MG tablet 50mg  po qam and 100mg  po qpm 90 tablet 3  . tiotropium (SPIRIVA HANDIHALER) 18 MCG inhalation capsule USE 1 CAPSULE IN INHALER DAILY 90 capsule 1   No current facility-administered medications on file prior to visit.     Past Medical History  Diagnosis Date  . COPD (chronic obstructive pulmonary disease)   . Diverticulitis   . Diabetes mellitus   . S/P colonoscopy 2010    Dr. Servando SnareWohl  . Hypotension   . Strep throat   . Diarrhea   . Weakness   . Bronchitis, acute, with bronchospasm   . Asthmatic bronchitis   . Constipation, chronic   . Sleep apnea   .  Facial pain   . Fibrocystic breast disease   . Neck pain   . Anxiety   . Osteopenia   . Hyperlipidemia   . Diverticulosis   . Stress-induced cardiomyopathy     a. echo 09/28/2014: EF 45-50%, severe HK of mid-distal anterior, apical and mid-distal inferior walls suggestive of stress induced CM, mildly dilated LA, mildly dilated PASP, mild TR, trivial pericardial effusion  . Hypertension      Past Surgical History  Procedure Laterality Date  . Abdominal hysterectomy      partial  . Nasal sinus surgery    . Cardiac catheterization  09/29/2014    [facility]     Family History  Problem Relation Age of Onset  . Diabetes Daughter   . Kidney disease Daughter   . Cancer Maternal  Aunt     breast  . Diabetes Maternal Grandmother   . Cancer Cousin     colon  . Lung cancer Mother     smoked  . COPD Mother     smoked  . COPD Father     smoked     History   Social History  . Marital Status: Married    Spouse Name: N/A    Number of Children: N/A  . Years of Education: N/A   Occupational History  . Not on file.   Social History Main Topics  . Smoking status: Former Smoker -- 0.25 packs/day for 40 years    Types: Cigarettes    Quit date: 11/24/2012  . Smokeless tobacco: Never Used     Comment: Uses nicotrol inhaler on occasion  . Alcohol Use: No  . Drug Use: No  . Sexual Activity: Not on file   Other Topics Concern  . Not on file   Social History Narrative   [place] in [place] 2 dogs. Works at [employer].      PHYSICAL EXAM   BP 130/80 mmHg  Pulse 101  Ht 5\' 6"  (1.676 m)  Wt 156 lb 8 oz (70.988 kg)  BMI 25.27 kg/m2 Constitutional: She is oriented to person, place, and time. She appears well-developed and well-nourished. No distress.  HENT: No nasal discharge.  Head: Normocephalic and atraumatic.  Eyes: Pupils are equal and round. No discharge.  Neck: Normal range of motion. Neck supple. No JVD present. No thyromegaly present.  Cardiovascular: Normal rate, regular rhythm, normal heart sounds. Exam reveals no gallop and no friction rub. No murmur heard.  Pulmonary/Chest: Effort normal and breath sounds normal. No stridor. No respiratory distress. She has no wheezes. She has no rales. She exhibits no tenderness.  Abdominal: Soft. Bowel sounds are normal. She exhibits no distension. There is no tenderness. There is no rebound and no guarding.  Musculoskeletal: Normal range of motion. She exhibits no edema and no tenderness.  Neurological: She is alert and oriented to person, place, and time. Coordination normal.  Skin: Skin is warm and dry. No rash noted. She is not diaphoretic. No erythema. No pallor.  Psychiatric: She has a normal  mood and affect. Her behavior is normal. Judgment and thought content normal.  Right radial pulse is normal with no hematoma.   WUJ:WJXBJEKG:Sinus  Tachycardia  -Left axis -anterior fascicular block.   -  Extensive T-abnormality  - Anterior/lateral and inferior ischemia.   ABNORMAL     ASSESSMENT AND PLAN

## 2014-10-17 NOTE — Patient Instructions (Signed)
Your physician has recommended you make the following change in your medication:  Start Toprol 25 mg once daily   Your physician recommends that you schedule a follow-up appointment in:  3 months Dr. Kirke CorinArida

## 2014-10-19 ENCOUNTER — Encounter: Payer: Self-pay | Admitting: Internal Medicine

## 2014-10-19 ENCOUNTER — Ambulatory Visit (INDEPENDENT_AMBULATORY_CARE_PROVIDER_SITE_OTHER): Payer: Medicare Other | Admitting: Internal Medicine

## 2014-10-19 VITALS — BP 119/75 | HR 82 | Temp 98.1°F | Ht 66.0 in | Wt 155.8 lb

## 2014-10-19 DIAGNOSIS — B029 Zoster without complications: Secondary | ICD-10-CM

## 2014-10-19 DIAGNOSIS — I10 Essential (primary) hypertension: Secondary | ICD-10-CM

## 2014-10-19 NOTE — Assessment & Plan Note (Signed)
Symptoms of pain improving with Neurontin. No longer using Hydrocodone. Will continue to monitor.

## 2014-10-19 NOTE — Patient Instructions (Signed)
Continue current medications.  Follow-up in 4 weeks

## 2014-10-19 NOTE — Progress Notes (Signed)
Pre visit review using our clinic review tool, if applicable. No additional management support is needed unless otherwise documented below in the visit note. 

## 2014-10-19 NOTE — Progress Notes (Signed)
   Subjective:    Patient ID: Tami Skinner, female    DOB: 05/24/1946, 68 y.o.   MRN: [id number]  HPI 68YO female presents for follow up.  Seen 12/7 for cervicalgia and herpes zoster. Started on Neurontin to help with pain.  Seen by cardiology 12/15. Plan for repeat ECHO in 3 months.  Started back on Metoprolol.  Feeling generally well.  Zoster - Taking Neurontin 100mg  tid with improvement in pain symptoms. Continues to have some spasm from Trapezius muscle.   Past medical, surgical, family and social history per today's encounter.  Review of Systems  Constitutional: Negative for fever, chills, appetite change, fatigue and unexpected weight change.  Eyes: Negative for visual disturbance.  Respiratory: Negative for shortness of breath.   Cardiovascular: Negative for chest pain and leg swelling.  Gastrointestinal: Negative for abdominal pain.  Musculoskeletal: Positive for myalgias and neck pain.  Skin: Negative for color change and rash.  Hematological: Negative for adenopathy. Does not bruise/bleed easily.  Psychiatric/Behavioral: Negative for dysphoric mood. The patient is not nervous/anxious.        Objective:    BP 119/75 mmHg  Pulse 82  Temp(Src) 98.1 F (36.7 C) (Oral)  Ht 5\' 6"  (1.676 m)  Wt 155 lb 12 oz (70.648 kg)  BMI 25.15 kg/m2  SpO2 92% Physical Exam  Constitutional: She is oriented to person, place, and time. She appears well-developed and well-nourished. No distress.  HENT:  Head: Normocephalic and atraumatic.  Right Ear: External ear normal.  Left Ear: External ear normal.  Nose: Nose normal.  Mouth/Throat: Oropharynx is clear and moist. No oropharyngeal exudate.  Eyes: Conjunctivae are normal. Pupils are equal, round, and reactive to light. Right eye exhibits no discharge. Left eye exhibits no discharge. No scleral icterus.  Neck: Normal range of motion. Neck supple. No tracheal deviation present. No thyromegaly present.  Cardiovascular: Normal  rate, regular rhythm, normal heart sounds and intact distal pulses.  Exam reveals no gallop and no friction rub.   No murmur heard. Pulmonary/Chest: Effort normal and breath sounds normal. No accessory muscle usage. No tachypnea. No respiratory distress. She has no decreased breath sounds. She has no wheezes. She has no rhonchi. She has no rales. She exhibits no tenderness.  Musculoskeletal: Normal range of motion. She exhibits no edema or tenderness.  Lymphadenopathy:    She has no cervical adenopathy.  Neurological: She is alert and oriented to person, place, and time. No cranial nerve deficit. She exhibits normal muscle tone. Coordination normal.  Skin: Skin is warm and dry. Rash noted. Rash is vesicular (crusted vessicles right neck). She is not diaphoretic. No erythema. No pallor.  Psychiatric: She has a normal mood and affect. Her behavior is normal. Judgment and thought content normal.          Assessment & Plan:   Problem List Items Addressed This Visit      Unprioritized   Essential hypertension, benign - Primary    BP Readings from Last 3 Encounters:  10/19/14 119/75  10/17/14 130/80  10/09/14 133/83   BP improved. Will continue current medications.    Herpes zoster    Symptoms of pain improving with Neurontin. No longer using Hydrocodone. Will continue to monitor.        Return in about 2 weeks (around 11/02/2014) for Wellness Visit.

## 2014-10-19 NOTE — Assessment & Plan Note (Signed)
BP Readings from Last 3 Encounters:  10/19/14 119/75  10/17/14 130/80  10/09/14 133/83   BP improved. Will continue current medications.

## 2014-10-25 ENCOUNTER — Ambulatory Visit (INDEPENDENT_AMBULATORY_CARE_PROVIDER_SITE_OTHER): Payer: 59 | Admitting: Psychology

## 2014-10-25 DIAGNOSIS — F4323 Adjustment disorder with mixed anxiety and depressed mood: Secondary | ICD-10-CM

## 2014-10-29 ENCOUNTER — Encounter: Payer: Self-pay | Admitting: Internal Medicine

## 2014-10-29 DIAGNOSIS — B029 Zoster without complications: Secondary | ICD-10-CM

## 2014-10-29 DIAGNOSIS — H6533 Chronic mucoid otitis media, bilateral: Secondary | ICD-10-CM

## 2014-10-29 DIAGNOSIS — I5181 Takotsubo syndrome: Secondary | ICD-10-CM

## 2014-10-29 DIAGNOSIS — F419 Anxiety disorder, unspecified: Secondary | ICD-10-CM

## 2014-10-29 DIAGNOSIS — I214 Non-ST elevation (NSTEMI) myocardial infarction: Secondary | ICD-10-CM

## 2014-10-30 ENCOUNTER — Ambulatory Visit (INDEPENDENT_AMBULATORY_CARE_PROVIDER_SITE_OTHER): Payer: Medicare Other | Admitting: Nurse Practitioner

## 2014-10-30 ENCOUNTER — Encounter: Payer: Self-pay | Admitting: Nurse Practitioner

## 2014-10-30 VITALS — BP 128/82 | HR 109 | Temp 97.4°F | Resp 14 | Ht 66.0 in | Wt 155.8 lb

## 2014-10-30 DIAGNOSIS — Z0000 Encounter for general adult medical examination without abnormal findings: Secondary | ICD-10-CM

## 2014-10-30 NOTE — Progress Notes (Signed)
Pre visit review using our clinic review tool, if applicable. No additional management support is needed unless otherwise documented below in the visit note. 

## 2014-10-30 NOTE — Progress Notes (Signed)
Subjective:    Patient ID: Tami Skinner, female    DOB: 11/21/1945, 68 y.o.   MRN: [id number]  HPI  Tami Skinner is a 68 yo female here for an annual wellness visit.   Health Screenings  Mammogram 12/19/13- Normal results Pap Smear- Partial Hysterectomy. No PAPs recently Colonoscopy 2010- repeat in 5 years  Was Dec. 3rd., needed to be rescheduled after having a MI  Bone Density- 05/09/2013 (-1.8) Left femur (osteopenic)  Glaucoma- 01/2014- Cataract removal, negative for glaucoma Hearing - No issues Hemoglobin A1C- 05/19/14 6.6  Cholesterol- 05/19/2014 LDL and Cholesterol elevated, On Crestor   Social  Alcohol intake- Denies Smoking history- Former smoker Quit 1/22/12014 Smokers in home- Husband- Cigars outside Illicit drug use- Denies Exercise- Silver Sneakers: want to start in 2016 Diet- Mrs. Dash seasoning, cooking at home, cut out salt Sexually Active- Denies Multiple Partners- Denies  Safety  Patient feesl safe at home.- Yes  Patient does have smoke detectors at home- Yes  Patient does wear sunscreen or protective clothing when in direct sunlight- Yes Patient does wear seat belt when driving or riding with others. - Yes  Activities of Daily Living Patient can do their own household chores. Denies needing assistance with: driving, feeding themselves, getting from bed to chair, getting to the toilet, bathing/showering, dressing, managing money, climbing flight of stairs, or preparing meals.   Depression Screen Patient denies losing interest in daily life, feeling hopeless, or crying easily over simple problems.  Fall Screen Patient denies being afraid of falling or falling in the last year.  Pt has 13 stairs from den to car and uses railing, very mindful  Memory Screen Patient denies problems with memory, misplacing items, and is able to balance checkbook/bank accounts.  Patient is alert, normal appearance, oriented to person/place/and time.  Correctly identified the  president of the [place], recall of 3 objects, and performing simple calculations.  Patient displays appropriate judgement and can read correct time from watch face.   Immunizations The following Immunizations are up to date: Influenza, shingles, pneumonia, and tetanus.   Other Providers Dr. Laymond PurserPerrin- Counselor   Dr. Mickel DuhamelMcQuaid-Pulmonary Dr. Dan HumphreysWalker - PCP Dr. Kirke CorinArida- Cardiologist   Review of Systems  No ROS. Annual Wellness Visit    Objective:   Physical Exam  No PE. Annual Wellness Visit  BP 128/82 mmHg  Pulse 109  Temp(Src) 97.4 F (36.3 C) (Oral)  Resp 14  Ht 5\' 6"  (1.676 m)  Wt 155 lb 12.8 oz (70.67 kg)  BMI 25.16 kg/m2  SpO2 96% Repeat pulse 72      Assessment & Plan:   This is a routine wellness examination for this patient.  I reviewed all health maintenance protocols including mammography, colonoscopy, bone density. No referrals needed. Age and diagnosis appropriate screening labs were not needed today.  Her immunization history was reviewed.  Her current medications and allergies were reviewed and needed refills of her chronic medications were ordered if needed.  The plan for yearly health maintenance was discussed.     MEDICARE ATTESTATION I have personally reviewed:  The patient's medical and social history. The use of alcohol, tobacco and illicit drugs. The current medications and supplements. The patient's function ability including ADLs, fall risks, home safety risks, cognitive, and hearing and visual impairment.   Diet and physical activities. Evaluation for depression and mood disorders.    The patient's weight, height, BMI and visual acuity have been recorded in the chart.  I have made referrals, counseled and  provided education to the patient based on review of the above.

## 2014-10-30 NOTE — Patient Instructions (Addendum)
You have had your annual wellness visit. We will see you for another one in 1 year.   Happy New Year!

## 2014-11-02 ENCOUNTER — Telehealth: Payer: Self-pay | Admitting: *Deleted

## 2014-11-02 NOTE — Telephone Encounter (Signed)
Patient called to see if Dr. Kirke CorinArida wants her to do cardiac rehab post hospitalization and will need a referral.  Please call patient.

## 2014-11-02 NOTE — Telephone Encounter (Signed)
Yes, refer to cardiac rehab. Diagnosis: myocardial infarction.

## 2014-11-02 NOTE — Telephone Encounter (Signed)
Left message on pt's vm that I am sending referral to [facility] and they will contact her w/ appt.  Asked her to call back w/ any questions or concerns.

## 2014-11-02 NOTE — Telephone Encounter (Signed)
Can I set her up for this?

## 2014-11-09 ENCOUNTER — Encounter: Payer: Self-pay | Admitting: *Deleted

## 2014-11-09 NOTE — Telephone Encounter (Signed)
From: Daivd CouncilMary H Oncale Sent: 08/14/2014 3:36 PM EDT To: Elwin SleightNancy C Falish Subject: RE:Flu Shot Clinic Saturday, 08/19/14 [place] [facility]  I got [place] flu shot at your [place] office on 08/09/14.  Please document my record in your office.  Thank you. Ofilia NeasMary Theriault  ----- Message ----- From: Solon AugustaNancy C F Sent: 08/14/2014 8:44 AM EDT To: Daivd CouncilMary H Cypress Subject: Flu Shot Clinic Saturday, 08/19/14 [place]  [facility] will hold a [facility] on Saturday, October 17 from 9 am to noon. In order to plan appropriately, we ask you to please call the office to schedule an appointment time during the clinic. Our schedulers are ready to assist you, Monday through Friday, 8a to 5p at [phone]. Thank you for choosing [facility] for your healthcare needs.

## 2014-11-10 ENCOUNTER — Other Ambulatory Visit: Payer: Self-pay | Admitting: Internal Medicine

## 2014-11-10 NOTE — Telephone Encounter (Signed)
Electronic Rx request received for Ventolin HFA. Inhaler is no longer on patient's medication list. Is it ok to refill?

## 2014-11-14 ENCOUNTER — Other Ambulatory Visit: Payer: Self-pay | Admitting: Internal Medicine

## 2014-11-15 ENCOUNTER — Other Ambulatory Visit: Payer: Self-pay | Admitting: Internal Medicine

## 2014-11-15 ENCOUNTER — Ambulatory Visit: Payer: 59 | Admitting: Psychology

## 2014-11-16 ENCOUNTER — Ambulatory Visit: Payer: Medicare Other | Admitting: Internal Medicine

## 2014-11-17 ENCOUNTER — Other Ambulatory Visit: Payer: Self-pay | Admitting: Internal Medicine

## 2014-11-30 ENCOUNTER — Ambulatory Visit: Payer: Self-pay | Admitting: Internal Medicine

## 2014-11-30 LAB — HM MAMMOGRAPHY: HM Mammogram: NEGATIVE

## 2014-12-07 ENCOUNTER — Ambulatory Visit (INDEPENDENT_AMBULATORY_CARE_PROVIDER_SITE_OTHER): Payer: 59 | Admitting: Internal Medicine

## 2014-12-07 ENCOUNTER — Encounter: Payer: Self-pay | Admitting: Internal Medicine

## 2014-12-07 VITALS — BP 112/71 | HR 75 | Temp 98.0°F | Ht 66.0 in | Wt 151.0 lb

## 2014-12-07 DIAGNOSIS — H6693 Otitis media, unspecified, bilateral: Secondary | ICD-10-CM

## 2014-12-07 DIAGNOSIS — F419 Anxiety disorder, unspecified: Secondary | ICD-10-CM

## 2014-12-07 DIAGNOSIS — B351 Tinea unguium: Secondary | ICD-10-CM | POA: Insufficient documentation

## 2014-12-07 DIAGNOSIS — E119 Type 2 diabetes mellitus without complications: Secondary | ICD-10-CM

## 2014-12-07 DIAGNOSIS — B029 Zoster without complications: Secondary | ICD-10-CM

## 2014-12-07 DIAGNOSIS — H6690 Otitis media, unspecified, unspecified ear: Secondary | ICD-10-CM | POA: Insufficient documentation

## 2014-12-07 MED ORDER — TIOTROPIUM BROMIDE MONOHYDRATE 2.5 MCG/ACT IN AERS: 2.0000 | INHALATION_SPRAY | Freq: Every day | RESPIRATORY_TRACT | Status: DC

## 2014-12-07 MED ORDER — SERTRALINE HCL 100 MG PO TABS: 100.0000 mg | ORAL_TABLET | Freq: Two times a day (BID) | ORAL | Status: DC

## 2014-12-07 MED ORDER — CEFDINIR 250 MG/5ML PO SUSR: 300.0000 mg | Freq: Every day | ORAL | Status: DC

## 2014-12-07 MED ORDER — CICLOPIROX 0.77 % EX GEL: 1.0000 "application " | Freq: Every day | CUTANEOUS | Status: DC

## 2014-12-07 MED ORDER — ALPRAZOLAM 0.5 MG PO TABS: 0.5000 mg | ORAL_TABLET | Freq: Three times a day (TID) | ORAL | Status: DC | PRN

## 2014-12-07 NOTE — Progress Notes (Signed)
Subjective:    Patient ID: Tami Skinner, female    DOB: 02-15-1946, 68 y.o.   MRN: [id number]  HPI  69YO female presents for follow up.  Symptoms of pain improved from shingles. Not taking Hydrocodone for pain any longer. Stopped taking Neurontin because made her feel anxious.  Feeling very anxious recently. Has overwhelming sense of doom. Would like to increase Sertraline or change medication for better control. Taking Alprazolam three times daily.  Concerned about bilateral ear pain and enlarged lymph node in her left neck. Not sure how long this has been present. It is painful to touch. Has not changed size this week. No fever, chills.  Also concerned about thickened fingernails on her 4th and 5th finger on her left hand. Questions fungal infection. Not painful. No trauma to nails.  Planning to start cardiac rehab and counseling through rehab. Starts Tuesday.   Past medical, surgical, family and social history per today's encounter.  Review of Systems  Constitutional: Negative for fever, chills, appetite change, fatigue and unexpected weight change.  HENT: Positive for ear pain. Negative for congestion, ear discharge, facial swelling, hearing loss, postnasal drip, rhinorrhea, sinus pressure, sneezing, sore throat, tinnitus, trouble swallowing and voice change.   Eyes: Negative for visual disturbance.  Respiratory: Negative for shortness of breath.   Cardiovascular: Negative for chest pain and leg swelling.  Gastrointestinal: Negative for abdominal pain.  Skin: Negative for color change, rash and wound.  Neurological: Negative for dizziness and headaches.  Hematological: Positive for adenopathy. Does not bruise/bleed easily.  Psychiatric/Behavioral: Positive for sleep disturbance and dysphoric mood. Negative for suicidal ideas. The patient is nervous/anxious.        Objective:    BP 112/71 mmHg  Pulse 75  Temp(Src) 98 F (36.7 C) (Oral)  Ht  (1.676 m)  Wt 151 lb  (68.493 kg)  BMI 24.38 kg/m2  SpO2 91% Physical Exam  Constitutional: She is oriented to person, place, and time. She appears well-developed and well-nourished. No distress.  HENT:  Head: Normocephalic and atraumatic.  Right Ear: External ear normal. Tympanic membrane is erythematous and bulging. A middle ear effusion is present.  Left Ear: External ear normal. Tympanic membrane is erythematous and bulging. A middle ear effusion is present.  Nose: Nose normal.  Mouth/Throat: Oropharynx is clear and moist. No oropharyngeal exudate.  Eyes: Conjunctivae are normal. Pupils are equal, round, and reactive to light. Right eye exhibits no discharge. Left eye exhibits no discharge. No scleral icterus.  Neck: Normal range of motion. Neck supple. No tracheal deviation present. No thyromegaly present.  Cardiovascular: Normal rate, regular rhythm, normal heart sounds and intact distal pulses.  Exam reveals no gallop and no friction rub.   No murmur heard. Pulmonary/Chest: Effort normal and breath sounds normal. No respiratory distress. She has no wheezes. She has no rales. She exhibits no tenderness.  Musculoskeletal: Normal range of motion. She exhibits no edema or tenderness.  Lymphadenopathy:    She has cervical adenopathy.       Left cervical: Superficial cervical (Approx 2cm diameter rubbery nodule c/w lymph node, mild TTP) adenopathy present.    She has no axillary adenopathy.  Neurological: She is alert and oriented to person, place, and time. No cranial nerve deficit. She exhibits normal muscle tone. Coordination normal.  Skin: Skin is warm and dry. No rash noted. She is not diaphoretic. No erythema. No pallor.     Psychiatric: Her speech is normal and behavior is normal. Judgment and thought  content normal. Her mood appears anxious. Cognition and memory are normal. She exhibits a depressed mood. She expresses no suicidal ideation.          Assessment & Plan:  Over 40min of which >50%  spent in face-to-face contact with patient discussing plan of care  Problem List Items Addressed This Visit      Unprioritized   Anxiety    Worsening symptoms of anxiety. Will increase Sertraline to 100mg  po bid. Will continue prn alprazolam. She had referral to counseling, but has not yet been able to see them. She will schedule. Follow up recheck in 3-4 weeks.      Relevant Medications   ALPRAZolam (XANAX) tablet   sertraline (ZOLOFT) tablet   Diabetes mellitus type 2, controlled    Recommended checking A1c with labs today, pt declines, prefers to wait until next visit. Continue current medications.      Herpes zoster - Primary    Pain from herpes zoster improved. Will continue prn Hydrocodone as needed.      Relevant Medications   Ciclopirox 0.77 % gel   cefdinir (OMNICEF) 250 MG/5ML suspension   Nail fungus    Exam is most consistent with fungal infection. Will start topical Ciclopirox. Follow up recheck in 3-4 weeks. Discussed nail culture if symptoms are not improving.      Relevant Medications   Ciclopirox 0.77 % gel   cefdinir (OMNICEF) 250 MG/5ML suspension   Otitis media    Exam consistent with bilateral OM with left anterior cervical lymphadenitis. Will start Omnicef. Follow up recheck in 3-4 weeks or sooner if symptoms are not improving.      Relevant Medications   cefdinir (OMNICEF) 250 MG/5ML suspension       Return in about 4 weeks (around 01/04/2015) for Recheck.

## 2014-12-07 NOTE — Assessment & Plan Note (Signed)
Worsening symptoms of anxiety. Will increase Sertraline to 100mg  po bid. Will continue prn alprazolam. She had referral to counseling, but has not yet been able to see them. She will schedule. Follow up recheck in 3-4 weeks.

## 2014-12-07 NOTE — Assessment & Plan Note (Signed)
Pain from herpes zoster improved. Will continue prn Hydrocodone as needed.

## 2014-12-07 NOTE — Progress Notes (Signed)
Pre visit review using our clinic review tool, if applicable. No additional management support is needed unless otherwise documented below in the visit note. 

## 2014-12-07 NOTE — Assessment & Plan Note (Signed)
Recommended checking A1c with labs today, pt declines, prefers to wait until next visit. Continue current medications.

## 2014-12-07 NOTE — Patient Instructions (Signed)
Increase Sertraline to 100mg  twice daily.  We will consider adding Abilify if symptoms of anxiety are persistent.  Start topical Ciclopirox to affected nails daily for 2-3 weeks. Each week, clean the nail with alcohol or polish remover.

## 2014-12-07 NOTE — Assessment & Plan Note (Addendum)
Exam is most consistent with fungal infection. Will start topical Ciclopirox. Follow up recheck in 3-4 weeks. Discussed nail culture if symptoms are not improving.

## 2014-12-07 NOTE — Assessment & Plan Note (Signed)
Exam consistent with bilateral OM with left anterior cervical lymphadenitis. Will start Omnicef. Follow up recheck in 3-4 weeks or sooner if symptoms are not improving.

## 2014-12-12 ENCOUNTER — Encounter: Payer: Self-pay | Admitting: Cardiovascular Disease

## 2014-12-24 ENCOUNTER — Other Ambulatory Visit: Payer: Self-pay | Admitting: Internal Medicine

## 2015-01-02 ENCOUNTER — Encounter
Admit: 2015-01-02 | Disposition: A | Discharge status: Home / Self Care | Payer: Self-pay | Attending: Cardiovascular Disease | Admitting: Cardiovascular Disease

## 2015-01-09 ENCOUNTER — Encounter: Payer: Self-pay | Admitting: Internal Medicine

## 2015-01-09 ENCOUNTER — Ambulatory Visit (INDEPENDENT_AMBULATORY_CARE_PROVIDER_SITE_OTHER): Payer: Medicare Other | Admitting: Internal Medicine

## 2015-01-09 VITALS — BP 114/71 | HR 81 | Temp 98.0°F | Ht 66.0 in | Wt 149.1 lb

## 2015-01-09 DIAGNOSIS — E119 Type 2 diabetes mellitus without complications: Secondary | ICD-10-CM

## 2015-01-09 DIAGNOSIS — F419 Anxiety disorder, unspecified: Secondary | ICD-10-CM

## 2015-01-09 DIAGNOSIS — H6533 Chronic mucoid otitis media, bilateral: Secondary | ICD-10-CM

## 2015-01-09 DIAGNOSIS — B351 Tinea unguium: Secondary | ICD-10-CM

## 2015-01-09 NOTE — Assessment & Plan Note (Signed)
Persistent symptoms of nail fungus despite topical treatment. Discussed that this treatment may take several months. Will set up dermatology evaluation to consider alternative treatment.

## 2015-01-09 NOTE — Assessment & Plan Note (Addendum)
Symptoms improved with Sertraline and prn Alprazolam. Encouraged follow up for counseling.

## 2015-01-09 NOTE — Assessment & Plan Note (Signed)
Chronic bilateral OM. Persistent bilateral middle ear effusions and erythematous TMs on exam. Will set up ENT evaluation. Question if she may need tympanostomy tubes.

## 2015-01-09 NOTE — Assessment & Plan Note (Signed)
Will check A1c with labs today. Continue metformin. 

## 2015-01-09 NOTE — Progress Notes (Signed)
Pre visit review using our clinic review tool, if applicable. No additional management support is needed unless otherwise documented below in the visit note. 

## 2015-01-09 NOTE — Progress Notes (Signed)
Subjective:    Patient ID: Tami Skinner, female    DOB: 02/23/1946, 69 y.o.   MRN: [id number]  HPI  69YO female presents for follow up.  Last seen 2/4 for anxiety. Anxiety - not feeling as "teary" on higher dose of Sertraline. Notes some ongoing issues with her husband, who abuses alcohol. Denies feeling unsafe. Plans to start counseling with Dr. Laymond PurserPerrin.  Nodule in neck improved after antibiotics. Continues to have left ear pain/pressure. No fever, chills. No nasal congestion.  No improvement in nail fungus with topical Cicloprirox yet.  Planning to drop out of pulm rehab for now. Difficult getting transportation from her husband, while they are remodeling their home. Plans to restart in the future.  DM - BG when checked typically 80-90s. Notes some dietary indiscretion.    Past medical, surgical, family and social history per today's encounter.  Review of Systems  Constitutional: Negative for fever, chills, appetite change, fatigue and unexpected weight change.  Eyes: Negative for visual disturbance.  Respiratory: Negative for shortness of breath.   Cardiovascular: Negative for chest pain and leg swelling.  Gastrointestinal: Negative for nausea, vomiting, abdominal pain, diarrhea and constipation.  Musculoskeletal: Negative for myalgias and arthralgias.  Skin: Negative for color change and rash.  Hematological: Negative for adenopathy. Does not bruise/bleed easily.  Psychiatric/Behavioral: Negative for suicidal ideas, sleep disturbance and dysphoric mood. The patient is not nervous/anxious.        Objective:    BP 114/71 mmHg  Pulse 81  Temp(Src) 98 F (36.7 C) (Oral)  Ht 5\' 6"  (1.676 m)  Wt 149 lb 2 oz (67.643 kg)  BMI 24.08 kg/m2  SpO2 93% Physical Exam  Constitutional: She is oriented to person, place, and time. She appears well-developed and well-nourished. No distress.  HENT:  Head: Normocephalic and atraumatic.  Right Ear: External ear normal. Tympanic  membrane is erythematous. A middle ear effusion is present.  Left Ear: External ear normal. Tympanic membrane is erythematous and bulging. A middle ear effusion is present.  Nose: Nose normal.  Mouth/Throat: Oropharynx is clear and moist. No oropharyngeal exudate.  Eyes: Conjunctivae are normal. Pupils are equal, round, and reactive to light. Right eye exhibits no discharge. Left eye exhibits no discharge. No scleral icterus.  Neck: Normal range of motion. Neck supple. No tracheal deviation present. No thyromegaly present.  Cardiovascular: Normal rate, regular rhythm, normal heart sounds and intact distal pulses.  Exam reveals no gallop and no friction rub.   No murmur heard. Pulmonary/Chest: Effort normal and breath sounds normal. No respiratory distress. She has no wheezes. She has no rales. She exhibits no tenderness.  Musculoskeletal: Normal range of motion. She exhibits no edema or tenderness.  Lymphadenopathy:    She has no cervical adenopathy.  Neurological: She is alert and oriented to person, place, and time. No cranial nerve deficit. She exhibits normal muscle tone. Coordination normal.  Skin: Skin is warm and dry. No rash noted. She is not diaphoretic. No erythema. No pallor.  Psychiatric: She has a normal mood and affect. Her behavior is normal. Judgment and thought content normal.          Assessment & Plan:   Problem List Items Addressed This Visit      Unprioritized   Anxiety    Symptoms improved with Sertraline and prn Alprazolam. Encouraged follow up for counseling.       Chronic otitis media    Chronic bilateral OM. Persistent bilateral middle ear effusions and erythematous TMs on  exam. Will set up ENT evaluation. Question if she may need tympanostomy tubes.      Relevant Orders   Ambulatory referral to ENT   Diabetes mellitus type 2, controlled - Primary    Will check A1c with labs today. Continue metformin.      Relevant Orders   Comprehensive metabolic  panel   Hemoglobin A1c   Microalbumin / creatinine urine ratio   Lipid panel   Nail fungus    Persistent symptoms of nail fungus despite topical treatment. Discussed that this treatment may take several months. Will set up dermatology evaluation to consider alternative treatment.      Relevant Orders   Ambulatory referral to Dermatology       Return in about 3 months (around 04/11/2015) for Recheck of Diabetes.

## 2015-01-09 NOTE — Patient Instructions (Signed)
Labs today.   Follow up in 3 months.  

## 2015-01-10 LAB — LIPID PANEL
CHOLESTEROL: 141 mg/dL (ref 0–200)
HDL: 61.4 mg/dL (ref >39.00)
LDL CALC: 42 mg/dL (ref 0–99)
NonHDL: 79.6
Total CHOL/HDL Ratio: 2
Triglycerides: 188 mg/dL — ABNORMAL HIGH (ref 0.0–149.0)
VLDL: 37.6 mg/dL (ref 0.0–40.0)

## 2015-01-10 LAB — COMPREHENSIVE METABOLIC PANEL
ALBUMIN: 4.3 g/dL (ref 3.5–5.2)
ALT: 13 U/L (ref 0–35)
AST: 17 U/L (ref 0–37)
Alkaline Phosphatase: 52 U/L (ref 39–117)
BILIRUBIN TOTAL: 0.3 mg/dL (ref 0.2–1.2)
BUN: 11 mg/dL (ref 6–23)
CALCIUM: 10 mg/dL (ref 8.4–10.5)
CO2: 32 meq/L (ref 19–32)
CREATININE: 0.84 mg/dL (ref 0.40–1.20)
Chloride: 103 mEq/L (ref 96–112)
GFR: 71.38 mL/min (ref >60.00)
Glucose, Bld: 122 mg/dL — ABNORMAL HIGH (ref 70–99)
Potassium: 4.4 mEq/L (ref 3.5–5.1)
SODIUM: 140 meq/L (ref 135–145)
TOTAL PROTEIN: 6.8 g/dL (ref 6.0–8.3)

## 2015-01-10 LAB — MICROALBUMIN / CREATININE URINE RATIO
Creatinine,U: 152.4 mg/dL
MICROALB UR: 1.5 mg/dL (ref 0.0–1.9)
Microalb Creat Ratio: 1 mg/g (ref 0.0–30.0)

## 2015-01-10 LAB — HEMOGLOBIN A1C: Hgb A1c MFr Bld: 6.6 % — ABNORMAL HIGH (ref 4.6–6.5)

## 2015-01-16 ENCOUNTER — Encounter: Payer: Self-pay | Admitting: Cardiovascular Disease

## 2015-01-16 ENCOUNTER — Ambulatory Visit (INDEPENDENT_AMBULATORY_CARE_PROVIDER_SITE_OTHER): Payer: Medicare Other | Admitting: Cardiovascular Disease

## 2015-01-16 VITALS — BP 108/70 | HR 84 | Ht 65.0 in | Wt 147.2 lb

## 2015-01-16 DIAGNOSIS — I5181 Takotsubo syndrome: Secondary | ICD-10-CM

## 2015-01-16 DIAGNOSIS — I10 Essential (primary) hypertension: Secondary | ICD-10-CM

## 2015-01-16 NOTE — Progress Notes (Signed)
Primary care physician: Dr. Victorino DikeJennifer walker  HPI  This is a pleasant 69 year old female who is here today for a follow-up visit for stress-induced cardiomyopathy. She was hospitalized in November with chest pain and shortness of breath with mildly elevated cardiac enzymes. Cardiac catheterization showed no evidence of obstructive coronary artery disease. Ejection fraction was 40-45% with wall motion abnormality suggestive of stress-induced cardiomyopathy. She has been under significant stress due to the fact that her husband is an alcoholic.  She has been doing reasonably well overall with no shortness of breath. She has occasional chest discomfort. She has been attending cardiac rehabilitation with overall improved strength. She is able to handle stress better than before. Toprol was resumed during last visit.  Allergies  Allergen Reactions  . Amoxicillin-Pot Clavulanate     REACTION: stomach sensitive.  Lenice Llamas. Daliresp [Roflumilast]     Severe nausea   . Erythromycin Base     REACTION: GI upset  . Neurontin [Gabapentin]     anxiety     Current Outpatient Prescriptions on File Prior to Visit  Medication Sig Dispense Refill  . ADVAIR DISKUS 250-50 MCG/DOSE AEPB INHALE 1 PUFF INTO LUNGS TWICE DAILY 60 each 5  . albuterol (PROVENTIL) (2.5 MG/3ML) 0.083% nebulizer solution Take 3 mLs (2.5 mg total) by nebulization every 6 (six) hours as needed for wheezing or shortness of breath. 75 mL 12  . ALPRAZolam (XANAX) 0.5 MG tablet Take 1 tablet (0.5 mg total) by mouth 3 (three) times daily as needed for anxiety. 90 tablet 3  . cetirizine (ZYRTEC) 10 MG tablet Take 10 mg by mouth daily.    . Cholecalciferol (VITAMIN D-3) 1000 UNITS CAPS Take 2 capsules by mouth.     Marland Kitchen. glucose blood (ONE TOUCH ULTRA TEST) test strip 1 each by Other route 2 (two) times daily as needed for other. Use as instructed 100 each 2  . metFORMIN (GLUCOPHAGE) 500 MG tablet TAKE 1 TABLET BY MOUTH TWICE A DAY WITH A MEAL 60 tablet 1   . metoprolol succinate (TOPROL-XL) 25 MG 24 hr tablet Take 1 tablet (25 mg total) by mouth daily. 30 tablet 6  . OXYGEN-HELIUM IN Inhale 2 % into the lungs at bedtime.      . rosuvastatin (CRESTOR) 5 MG tablet Take 1 tablet (5 mg total) by mouth daily. 90 tablet 1  . sertraline (ZOLOFT) 100 MG tablet Take 1 tablet (100 mg total) by mouth 2 (two) times daily. 60 tablet 3  . valsartan (DIOVAN) 160 MG tablet TAKE 1 TABLET BY MOUTH EVERY DAY 30 tablet 3  . VENTOLIN HFA 108 (90 BASE) MCG/ACT inhaler INHALE TWO PUFFS INTO THE LUNGS EVERY FOUR HOURS AS NEEDED FOR WHEEZING 18 each 0   No current facility-administered medications on file prior to visit.     Past Medical History  Diagnosis Date  . COPD (chronic obstructive pulmonary disease)   . Diverticulitis   . Diabetes mellitus   . S/P colonoscopy 2010    Dr. Servando SnareWohl  . Hypotension   . Strep throat   . Diarrhea   . Weakness   . Bronchitis, acute, with bronchospasm   . Asthmatic bronchitis   . Constipation, chronic   . Sleep apnea   . Facial pain   . Fibrocystic breast disease   . Neck pain   . Anxiety   . Osteopenia   . Hyperlipidemia   . Diverticulosis   . Stress-induced cardiomyopathy     a. echo 09/28/2014: EF 45-50%, severe HK  of mid-distal anterior, apical and mid-distal inferior walls suggestive of stress induced CM, mildly dilated LA, mildly dilated PASP, mild TR, trivial pericardial effusion  . Hypertension      Past Surgical History  Procedure Laterality Date  . Abdominal hysterectomy      partial  . Nasal sinus surgery    . Cardiac catheterization  09/29/2014    [facility]     Family History  Problem Relation Age of Onset  . Diabetes Daughter   . Kidney disease Daughter   . Cancer Maternal Aunt     breast  . Diabetes Maternal Grandmother   . Cancer Cousin     colon  . Lung cancer Mother     smoked  . COPD Mother     smoked  . COPD Father     smoked     History   Social History  . Marital Status:  Married    Spouse Name: N/A  . Number of Children: N/A  . Years of Education: N/A   Occupational History  . Not on file.   Social History Main Topics  . Smoking status: Former Smoker -- 0.25 packs/day for 40 years    Types: Cigarettes    Quit date: 11/24/2012  . Smokeless tobacco: Never Used     Comment: Uses nicotrol inhaler on occasion  . Alcohol Use: No  . Drug Use: No  . Sexual Activity: Not on file   Other Topics Concern  . Not on file   Social History Narrative   Lives with husband in [place]. 2 dogs. Works at [employer].      PHYSICAL EXAM   BP 108/70 mmHg  Pulse 84  Ht  (1.651 m)  Wt 147 lb 4 oz (66.792 kg)  BMI 24.50 kg/m2 Constitutional: She is oriented to person, place, and time. She appears well-developed and well-nourished. No distress.  HENT: No nasal discharge.  Head: Normocephalic and atraumatic.  Eyes: Pupils are equal and round. No discharge.  Neck: Normal range of motion. Neck supple. No JVD present. No thyromegaly present.  Cardiovascular: Normal rate, regular rhythm, normal heart sounds. Exam reveals no gallop and no friction rub. No murmur heard.  Pulmonary/Chest: Effort normal and breath sounds normal. No stridor. No respiratory distress. She has no wheezes. She has no rales. She exhibits no tenderness.  Abdominal: Soft. Bowel sounds are normal. She exhibits no distension. There is no tenderness. There is no rebound and no guarding.  Musculoskeletal: Normal range of motion. She exhibits no edema and no tenderness.  Neurological: She is alert and oriented to person, place, and time. Coordination normal.  Skin: Skin is warm and dry. No rash noted. She is not diaphoretic. No erythema. No pallor.  Psychiatric: She has a normal mood and affect. Her behavior is normal. Judgment and thought content normal.        ASSESSMENT AND PLAN

## 2015-01-16 NOTE — Assessment & Plan Note (Signed)
Blood pressure is well controlled on current medications. 

## 2015-01-16 NOTE — Patient Instructions (Signed)
Continue same medications.   Your physician wants you to follow-up in: 6 months.  You will receive a reminder letter in the mail two months in advance. If you don't receive a letter, please call our office to schedule the follow-up appointment.  

## 2015-01-16 NOTE — Assessment & Plan Note (Signed)
She is doing very well overall with no symptoms suggestive of angina or heart failure. She is attending cardiac rehabilitation with improved stamina. I recommend continuing treatment with Toprol and Diovan. Ejection fraction was 40-45% which I suspect is better now.

## 2015-01-19 ENCOUNTER — Other Ambulatory Visit: Payer: Self-pay | Admitting: Internal Medicine

## 2015-01-24 ENCOUNTER — Other Ambulatory Visit: Payer: Self-pay | Admitting: Internal Medicine

## 2015-02-02 ENCOUNTER — Encounter
Admit: 2015-02-02 | Disposition: A | Discharge status: Home / Self Care | Payer: Self-pay | Attending: Cardiovascular Disease | Admitting: Cardiovascular Disease

## 2015-02-14 ENCOUNTER — Other Ambulatory Visit: Payer: Self-pay | Admitting: *Deleted

## 2015-02-14 MED ORDER — METFORMIN HCL 500 MG PO TABS: ORAL_TABLET | ORAL | Status: DC

## 2015-02-24 NOTE — Op Note (Signed)
PATIENT NAME:  Tami Skinner, Tami Skinner MR#:  [id number] DATE OF BIRTH:  [birth year]  DATE OF PROCEDURE:  01/09/2014  PREOPERATIVE DIAGNOSIS: Cataract, left eye.   POSTOPERATIVE DIAGNOSIS: Cataract, left eye.   PROCEDURE PERFORMED: Extracapsular cataract extraction using phacoemulsification with placement of an Alcon [id number], 22.0-diopter posterior chamber lens, serial number [id number].   SURGEON: Maylon PeppersSteven A. Excell Neyland, M.D.   ANESTHESIA: 4% lidocaine and 0.75% Marcaine a 50-50 mixture with 10 units/mL of Hyalex added, given as a peribulbar.   ANESTHESIOLOGIST: Dr. Darleene CleaverVan Staveren.   COMPLICATIONS: None.   ESTIMATED BLOOD LOSS: Less than 1 mL.   DESCRIPTION OF PROCEDURE: PREOPERATIVE DIAGNOSIS:  Cataract, left eye.    POSTOPERATIVE DIAGNOSIS:  Cataract, left eye.  PROCEDURE PERFORMED:  Extracapsular cataract extraction using phacoemulsification with placement of an Alcon SN60WF, 22.0-diopter posterior chamber lens, serial # [id number].  SURGEON:  Maylon PeppersSteven A. Rickie Gutierres, MD  ASSISTANT:  None.  ANESTHESIA:  4% lidocaine and 0.75% Marcaine in a 50/50 mixture without Wydase added, given as a peribulbar.  ANESTHESIOLOGIST:  Dr. Darleene CleaverVan Staveren  COMPLICATIONS:  None.  ESTIMATED BLOOD LOSS:  Less than 1 ml.  DESCRIPTION OF PROCEDURE:  The patient was brought to the operating room and given a peribulbar block.  The patient was then prepped and draped in the usual fashion.  The vertical rectus muscles were imbricated using 5-0 silk sutures.  These sutures were then clamped to the sterile drapes as bridle sutures.  A limbal peritomy was performed extending two clock hours and hemostasis was obtained with cautery.  A partial thickness scleral groove was made at the surgical limbus and dissected anteriorly in a lamellar dissection using an Alcon crescent knife.  The anterior chamber was entered supero-temporally with a Superblade and through the lamellar dissection with a 2.6 mm keratome.  DisCoVisc was  used to replace the aqueous and a continuous tear capsulorrhexis was carried out.  Hydrodissection and hydrodelineation were carried out with balanced salt and a 27 gauge canula.  The nucleus was rotated to confirm the effectiveness of the hydrodissection.  Phacoemulsification was carried out using a divide-and-conquer technique.  Total ultrasound time was 1 minute and 19 seconds with an average power of  23.6%. CDE 27.36.  Irrigation/aspiration was used to remove the residual cortex.  DisCoVisc was used to inflate the capsule and the internal incision was enlarged to 3 mm with the crescent knife.  The intraocular lens was folded and inserted into the capsular bag using the Ecolablcon Monarch shooter.  Irrigation/aspiration was used to remove the residual DisCoVisc.  Miostat was injected into the anterior chamber through the paracentesis track to inflate the anterior chamber and induce miosis.  One tenth of a mg of Vigamox was injected via the paracentesis track. The wound was checked for leaks and none were found. The conjunctiva was closed with cautery and the bridle sutures were removed.  Two drops of 0.3% Vigamox were placed on the eye.   An eye shield was placed on the eye.  The patient was discharged to the recovery room in good condition.  ____________________________ Maylon PeppersSteven A. Glen Blatchley, MD sad:aw D: 01/09/2014 13:47:12 ET T: 01/09/2014 14:12:15 ET JOB#: [id number]  cc: Viviann SpareSteven A. Bhavya Grand, MD, <Dictator> Erline LevineSTEVEN A Janari Yamada MD ELECTRONICALLY SIGNED 01/16/2014 13:52

## 2015-02-24 NOTE — Discharge Summary (Signed)
PATIENT NAME:  Tami Skinner, Tami Skinner MR#:  [id number] DATE OF BIRTH:  04/22/1946  DATE OF ADMISSION:  09/27/2014 DATE OF DISCHARGE:  09/30/2014  DISCHARGE DIAGNOSES: 1. Non-ST elevation myocardial infarction with cardiac catheterization showing no obstructive coronary disease, ejection fraction of 45% with wall motion abnormality suggestive of stress-induced cardiomyopathy, which is likely the cause of her presentation.  2. Hypomagnesemia/hypophosphatemia, repleted and resolved.  3. Pleuritic chest pain, improving.  4. Acute bronchitis on antibiotics and cough medication. The patient refused steroids.   SECONDARY DIAGNOSES: 1. Chronic obstructive pulmonary disease.  2. Hypertension.  3. Hyperlipidemia.  4. Depression.  5. Diabetes.   CONSULTATIONS: Cardiology, Dr. Lorine BearsMuhammad Arida.   PROCEDURES AND RADIOLOGY:  1. Cardiac catheterization on the 27th of November showed no obstructive coronary disease, EF of 45%, wall motion abnormality suggestive of stress-induced cardiomyopathy.  2. A 2-D echocardiogram on the 27th of November showed an LVEF of 45% to 50%. Severe hypokinesis mid to distal apical and mid distal inferior wall, suggestive of stress-induced cardiomyopathy versus LAD infarct.  3. Chest x-ray on admission showed no acute cardiopulmonary disease.  4. CT scan of the chest in the emergency department showed no evidence of PE. Possible interstitial edema. Possible benign pulmonary nodules, which are stable over the last 2 years.   LABORATORY PANEL: Urinalysis on admission was negative. Blood cultures x 2 were negative. Urine culture was contaminated. Sputum culture had moderate WBCs. No other pathogen identified other than a few gram-positive cocci in rods.   HISTORY AND SHORT HOSPITAL COURSE: The patient is a 69 year old female with the above-mentioned medical problems who was admitted for chest pain, worrisome for angina and non-ST elevation MI. Please see Dr. Camillia HerterMody's dictated history and  physical for further details. Cardiology consultation was obtained with Dr. Lorine BearsMuhammad Arida, who recommended a 2-D echo which showed a possible wall motion abnormality, concerning for her ruling for an MI in echo findings. He recommended cardiac catheterization, which was performed on the 27th of November showing no obstructive coronary artery disease, but wall motion abnormality suggestive of stress-induced cardiomyopathy. The patient was counseled for stress relief. She was feeling much better, but she did have some cough, worrisome for acute bronchitis and was prescribed antibiotic. She would likely benefit from steroids, but the patient refused steroids. She was feeling much better and was discharged home on the 28th of November.   PHYSICAL EXAMINATION: On date of discharge: VITAL SIGNS: On the date of discharge, her vital signs are as follows: Temperature 97.6, heart rate 94 per minute, respirations 18 per minute, blood pressure 111/74. She was saturating 98% on 1 liter oxygen via nasal cannula. She did qualify for 1 liter oxygen on discharge as she was somewhat hypoxic on ambulation.   CARDIOVASCULAR: S1, S2 normal. No murmurs, rubs or gallop.   LUNGS: She had occasional rhonchi. No wheezing or rales.   NEUROLOGIC: Nonfocal examination.   ABDOMEN: Soft, benign. All other physical examination remained at baseline.   DISCHARGE MEDICATIONS:   Medication Instructions  metformin 500 mg oral tablet  1 tab(s) orally 2 times a day   advair diskus 250 mcg-50 mcg inhalation powder  1 puff(s) inhaled 2 times a day   spiriva 18 mcg inhalation capsule  1 cap(s) inhaled once a day (at bedtime)   ventolin hfa cfc free 90 mcg/inh inhalation aerosol  2 puff(s) inhaled every 4 to 6 hours, As Needed - for Shortness of Breath   cetirizine 10 mg oral tablet  1 tab(s) orally once  a day (at bedtime)   alprazolam 0.5 mg oral tablet  1 tab(s) orally 3 times a day   sertraline 50 mg oral tablet  1 tab(s) orally 2  times a day   dramamine 50 mg oral tablet, chewable  1 tab(s) orally every 6 hours, As Needed for motion sickness   acetaminophen-phenylephrine 325 mg-5 mg oral tablet  2 tab(s) orally every 4 hours, As Needed for congestion/pain   lisinopril 5 mg oral tablet  1 tab(s) orally once a day   rosuvastatin 20 mg oral tablet  1 tab(s) orally once a day (at bedtime)   metoprolol succinate 25 mg oral tablet, extended release  1 tab(s) orally once a day   nicotine 21 mg/24 hr transdermal film, extended release  1 patch transdermal once a day   guaifenesin  5 milliliter(s) orally 4 times a day x 5 days, As Needed, cough , As needed, cough   ibuprofen 400 mg oral tablet  1 tab(s) orally 2 times a day x 5 days, As Needed - for Chest Pain   levaquin 750 mg oral tablet  1 tab(s) orally every 24 hours x 5 days      DISCHARGE DIET: Low sodium, low fat, low cholesterol, 1800 ADA.   DISCHARGE ACTIVITY: As tolerated.   DISCHARGE INSTRUCTIONS AND FOLLOWUP: The patient was instructed to follow up with her primary care physician, Dr. Ronna Polio, in 1 to 2 weeks. She will need followup with Dr. Lorine Bears in 2 to 4 weeks. She was set up to get home health, nursing at home.   TOTAL TIME DISCHARGING THIS PATIENT: 45 minutes.   She remains at very high risk for readmission.     ____________________________ Ellamae Sia. Sherryll Burger, MD vss:TT D: 09/30/2014 16:32:00 ET T: 09/30/2014 18:21:44 ET JOB#: [id number]  cc: Elizbeth Posa S. Sherryll Burger, MD, <Dictator> Ginette Pitman. Dan Humphreys, MD Chelsea Aus. Kirke Corin, MD Patricia Pesa MD ELECTRONICALLY SIGNED 09/30/2014 19:13

## 2015-02-24 NOTE — H&P (Signed)
PATIENT NAME:  Tami Skinner, Tami Skinner MR#:  [id number] DATE OF BIRTH:  07/29/1946  DATE OF ADMISSION:  09/27/2014  PRIMARY CARE PHYSICIAN: Dr. Ronna Polio.    CHIEF COMPLAINT:  Substernal chest pain radiating to the shoulder and jaw on the left side.    HISTORY OF PRESENT ILLNESS: This is a very pleasant 69 year old female with a history of tobacco dependence, COPD, hypertension, hyperlipidemia, diabetes, who presents with the above complaint. The patient says yesterday she had chest pain radiating to her neck and shoulders lasting just a couple of minutes. She thought it was indigestion, took some antacid, and it apparently went away throughout the day. This a.m. she woke up with 8 out of 10 chest pain radiating to her left jaw and shoulder and she was feeling very weak. She called her doctor, who recommended urgent care and urgent care sent her to the ER for further evaluation. She says it is relieved with rest and agitated by exertion, but currently is a 7 out of 10.   REVIEW OF SYSTEMS:  CONSTITUTIONAL: No fever. Positive fatigue, weakness.  EYES: No blurred or double vision.   EARS, NOSE, AND THROAT:  No ear pain, hearing loss, seasonal allergies, postnasal drip.  RESPIRATORY: No cough, wheezing, hemoptysis, COPD.    CARDIOVASCULAR: Positive chest pain.  No orthopnea, edema, arrhythmia, dyspnea on exertion, palpitations, or syncope.  GASTROINTESTINAL: No nausea, vomiting, diarrhea, abdominal pain, melena, or ulcers.  GENITOURINARY:  No dysuria or hematuria.  ENDOCRINE: No polyuria, polydipsia.   HEMATOLOGIC/LYMPHATIC;  No bleeding, swollen glands.   SKIN:  No rash or lesions.   MUSCULOSKELETAL:  No weakness  NEUROLOGIC:  No history of  CVA, TIA, seizures.  PSYCHIATRIC:  No history of anxiety. Positive depression.   PAST MEDICAL HISTORY:  1.  COPD.   2.  Hypertension.  3.  Hyperlipidemia.  4.  Tobacco dependence.  5.  Depression.  6.  Diabetes.   ALLERGIES:  1.  AMOXICILLIN.   2.   DALIRESP.   3.  ERYTHROMYCIN.   SOCIAL HISTORY: The patient smokes 5 cigarettes a day. No alcohol or IV drug use.   PAST SURGICAL HISTORY:  1.  Hysterectomy.  2.  Sinus surgery.   FAMILY HISTORY: Positive for emphysema, lung cancer, and polycystic kidney disease.   MEDICATIONS:  1. Ventolin HFA 2 puffs q. 4-6 hours p.r.n.  2. Spiriva 18 mcg daily.  3. Zoloft 50 mg b.i.d.   4. Metformin 500 b.i.d.  5. Lisinopril 20 mg daily.   6. Dramamine 50 mg q. 6 hours p.r.n.  7. Crestor 5 mg daily.  8. Xanax 0.5 mg t.i.d.  9. Cetirizine 10 mg daily.  10. Advair Diskus 250/50 b.i.d.  11. Acetaminophen/phenylephrine 5/325, 2 tablets q. 4 hours p.r.n.   PHYSICAL EXAMINATION:  VITAL SIGNS: Temperature 99, pulse is 114, respirations 18, blood pressure 100/64, 91% on room air.  GENERAL: The patient is alert, oriented, she is in just very minimal distress due to pain. HEENT: Head is atraumatic. Pupils are round and reactive. Sclerae anicteric. Mucus membranes are moist. Oropharynx is clear.  NECK: Supple without JVD, carotid bruit, or enlarged thyroid.  CARDIOVASCULAR: Tachycardia. No murmurs, gallops, or rubs. PMI is not displaced. LUNGS: Clear to auscultation without crackles, rales, rhonchi, or wheezing. Normal to percussion.  ABDOMEN: Bowel sounds are positive. Nontender, nondistended.  No hepatosplenomegaly.  No rebound or guarding.  EXTREMITIES: No clubbing, cyanosis, or edema.  NEUROLOGIC:  Cranial nerves II through XII are intact.  No focal  deficits.  SKIN:  Without rash or lesions.   LABORATORIES:  1.  Lactic acid 1.3.  2.  Troponin 0.83.   3.  White blood cells 17, hemoglobin 15.4, hematocrit 40, platelets are 282,000.  4.  Sodium 135, potassium 4.3, chloride 102, bicarbonate 27, BUN 13, creatinine 0.79, glucose 141, calcium 9.3.  5.  Magnesium 1.7.  6.  D-dimer 763, INR 0.9.  7.  Phosphorus is 2.0.  8.  Urinalysis is negative LCE and nitrites.  9.  CT of the chest shows no  evidence of pulmonary emboli, a stable right lower lobe pulmonary nodule over 2 years since this is benign etiology.   10.  Chest x-ray shows no acute cardiopulmonary disease.  11.  EKG showed T waves in the anterior leads.   ASSESSMENT AND PLAN: A 69 year old female who presented with chest pain radiating to her shoulders and left jaw and elevated troponin consistent with non-ST elevation myocardial infarction.    1.  Non-ST elevation myocardial infarction, initial episode. The patient has been consented for heparin drip, we will continue this. Continue statin and aspirin. Due to her low blood pressure we will hold off the metoprolol. She is on a low dose of lisinopril which we will continue. Cardiology has been consulted. Echocardiogram has been ordered. We will continue to follow troponins. Further management as per cardiology.  2.  Hypo-magnesium and phosphorus. We will replete.  3.  Elevated white blood cell count, I suspect this is acute phase reactant from her problem number 1. We will continue to monitor. Chest x-ray does not appear to have any source of infection and urinalysis shows no urinary tract infection.  4.  Diabetes. For now holding metformin. Continue ADA diet and sliding scale insulin. 5.  Hyperlipidemia. We will check fasting lipids. Continue Crestor. 6.  History of chronic obstructive pulmonary disease, does not appear to be in exacerbation at this time. We will continue her outpatient inhalers.  7.  The patient is a full code status.   TIME SPENT: Approximately 50 minutes.      ____________________________ Janyth ContesSital P. Juliene PinaMody, MD spm:bu D: 09/27/2014 20:11:04 ET T: 09/27/2014 20:36:19 ET JOB#: [id number]  cc: Victoriah Wilds P. Juliene PinaMody, MD, <Dictator> Ginette PitmanJennifer A. Dan HumphreysWalker, MD Janyth ContesSITAL P Deepti Gunawan MD ELECTRONICALLY SIGNED 09/29/2014 21:44

## 2015-02-24 NOTE — H&P (Signed)
PATIENT NAME:  Tami Skinner, Tami Skinner MR#:  [id number] DATE OF BIRTH:  21-Jul-1946  ADDENDUM  The patient smokes about 5 cigarettes a day. She is trying to quit. The patient was counseled for 3-1/2 minutes. Due to her non-STEMI, at this time she is deferring nicotine patch.    ____________________________ Annaya Bangert P. Juliene PinaMody, MD spm:LT D: 09/27/2014 20:15:30 ET T: 09/27/2014 20:36:50 ET JOB#: [id number]  cc: Alivya Wegman P. Juliene PinaMody, MD, <Dictator> Janyth ContesSITAL P Elizbeth Posa MD ELECTRONICALLY SIGNED 09/29/2014 21:43

## 2015-02-24 NOTE — Consult Note (Signed)
Brief Consult Note: Diagnosis: NSTEMI.   Patient was seen by consultant.   Recommend to proceed with surgery or procedure.   Comments: check echo today.  Continue Heparin.  Cardiac cath tomorrow.  Electronic Signatures: Lorine BearsArida, Radek Carnero (MD)  (Signed [id number]-Nov-15 09:47)  Authored: Brief Consult Note   Last Updated: 26-Nov-15 09:47 by Lorine BearsArida, Attila Mccarthy (MD)

## 2015-02-28 NOTE — Consult Note (Signed)
PATIENT NAME:  Tami Skinner, [name] MR#:  [id number] DATE OF BIRTH:  [birth year]  DATE OF CONSULTATION:  09/28/2014  REFERRING PHYSICIAN:   CONSULTING PHYSICIAN:  Muhammad A. Kirke CorinArida, MD  REQUESTING PHYSICIAN: Dr. Juliene PinaMody.   PRIMARY CARE PHYSICIAN: Dr. Ronna PolioJennifer Walker.   REASON FOR CONSULTATION: Myocardial infarction.   HISTORY OF PRESENT ILLNESS: This is a 69 year old female with no previous cardiac history. She has known history of tobacco use, COPD, hypertension, hyperlipidemia and type 2 diabetes. She presented initially to urgent care with substernal chest pain radiating to her left shoulder and jaw. She reports that she has been having this intermittent discomfort for a few days before presentation. It got worse yesterday and radiated to her left arm and jaw, and thus she became concerned. She has been feeling very weak since then. She reports an upper respiratory tract infection a few weeks ago. She has felt weak since that time. She reports that occasionally the chest pain is worse with a deep breath and coughing. Also, her chest is sore to touch. She was found to have a mildly elevated troponin. EKG showed no acute ST or T wave changes.   PAST MEDICAL HISTORY: 1. COPD. 2. Hypertension.  3. Hyperlipidemia.  4. Tobacco use.  5. Depression.  6. Type 2 diabetes.   ALLERGIES: INCLUDE AMOXICILLIN,  AND ERYTHROMYCIN.   SOCIAL HISTORY: Remarkable for smoking 5 cigarettes per day. She denies alcohol or recreational drug use.   FAMILY HISTORY: Is remarkable for COPD and polycystic kidney disease. There is no family history of coronary artery disease.   HOME MEDICATIONS: 1. Ventolin as needed.  2. Spiriva.  3. Zoloft 50 mg daily.  4. Metformin 500 mg b.i.d.  5. Lisinopril 20 mg daily.  6. Dramamine 50 mg every 6 hours as needed.  7. Crestor 5 mg daily.  8. Xanax 0.5 mg t.i.d.  9. Cetirizine 10 mg daily.  10. Advair twice daily. 11. Acetaminophen/phenylephrine every 4 hours as needed.    REVIEW OF SYSTEMS: A 10-point review of systems was performed. It is negative other than what is mentioned in the HPI.   PHYSICAL EXAMINATION:  GENERAL: The patient appears to be at her stated age and in no acute distress.  VITAL SIGNS: Temperature 98.9, pulse is 83, respiratory rate is 18, blood pressure is 94/60, and oxygen saturation is 93% on 3 liters nasal cannula.  HEENT: Normocephalic, atraumatic.  NECK: No JVD or carotid bruits.  RESPIRATORY: Normal respiratory effort with no use of accessory muscles. Auscultation reveals normal breath sounds.  CARDIOVASCULAR: Normal PMI. Normal S1 and S2 with no gallops or murmurs.  ABDOMEN: Benign, nontender, and nondistended.  EXTREMITIES: No clubbing, cyanosis or edema.  SKIN: Warm and dry with no rash.  PSYCHIATRIC: She is alert, oriented x 3 with normal mood and affect.   LABORATORY AND DIAGNOSTIC DATA: ECG showed normal sinus rhythm with poor R wave progression in the anterior leads and nonspecific T wave changes.   Laboratories showed normal kidney function. Troponin was 0.83 and decreased to 0.65. White cell count was initially elevated at 17,000 and currently is 10,000.   IMPRESSION:  1. Non-ST-elevation myocardial infarction.  2. Hypertension.  3. Hyperlipidemia.  4. Type 2 diabetes.   RECOMMENDATIONS: The patient's presentation is somewhat atypical. Some of her symptoms seem to be pleuritic. Nonetheless, she definitely has some anginal features with elevated troponin. She has multiple risk factors for coronary artery disease. I agree with continuing a heparin drip. An echocardiogram was ordered and  has not been done yet. This will be reviewed to ensure no presence of a pericardial effusion. I recommend proceeding with cardiac catheterization and possible coronary intervention. I discussed the risks, benefits and alternatives extensively with the patient. Further recommendations to follow after cardiac testing.     ____________________________ Chelsea Aus. Kirke Corin, MD maa:JT D: 09/28/2014 09:53:37 ET T: 09/28/2014 12:43:27 ET JOB#: [id number]  cc: Muhammad A. Kirke Corin, MD, <Dictator> Sital P. Juliene Pina, MD Ginette Pitman Dan Humphreys, MD Jerolyn Center [place] Donovan MD ELECTRONICALLY SIGNED 11/03/2014 9:33

## 2015-03-03 ENCOUNTER — Other Ambulatory Visit: Payer: Self-pay | Admitting: Internal Medicine

## 2015-03-05 ENCOUNTER — Ambulatory Visit: Payer: Self-pay

## 2015-03-06 ENCOUNTER — Encounter: Payer: Self-pay | Admitting: Internal Medicine

## 2015-03-06 ENCOUNTER — Other Ambulatory Visit: Payer: Self-pay | Admitting: Internal Medicine

## 2015-03-07 ENCOUNTER — Ambulatory Visit: Payer: Self-pay

## 2015-03-08 ENCOUNTER — Ambulatory Visit: Payer: Self-pay

## 2015-03-12 ENCOUNTER — Ambulatory Visit: Payer: Self-pay

## 2015-03-14 ENCOUNTER — Ambulatory Visit: Payer: Self-pay

## 2015-03-15 ENCOUNTER — Telehealth: Payer: Self-pay | Admitting: *Deleted

## 2015-03-15 ENCOUNTER — Ambulatory Visit: Payer: Self-pay

## 2015-03-15 NOTE — Telephone Encounter (Signed)
I left Tami Skinner a vm that would discharge her since she has not been able to attend since Jan 01, 2015. Spoke to her in past months and she wanted to return but has been unable to attend. Did 4 of 36 sessions of Cardiac Rehab.

## 2015-03-19 ENCOUNTER — Ambulatory Visit: Payer: Self-pay

## 2015-03-21 ENCOUNTER — Ambulatory Visit: Payer: Self-pay

## 2015-03-22 ENCOUNTER — Ambulatory Visit: Payer: Self-pay

## 2015-03-26 ENCOUNTER — Ambulatory Visit: Payer: Self-pay

## 2015-03-28 ENCOUNTER — Ambulatory Visit: Payer: Self-pay

## 2015-03-29 ENCOUNTER — Ambulatory Visit: Payer: Self-pay

## 2015-04-04 ENCOUNTER — Other Ambulatory Visit: Payer: Self-pay | Admitting: Internal Medicine

## 2015-04-04 ENCOUNTER — Ambulatory Visit: Payer: Self-pay

## 2015-04-05 ENCOUNTER — Ambulatory Visit: Payer: Self-pay

## 2015-04-09 ENCOUNTER — Ambulatory Visit: Payer: Self-pay

## 2015-04-11 ENCOUNTER — Ambulatory Visit: Payer: Self-pay

## 2015-04-12 ENCOUNTER — Ambulatory Visit: Payer: Self-pay

## 2015-04-16 ENCOUNTER — Ambulatory Visit: Payer: Self-pay

## 2015-04-17 ENCOUNTER — Other Ambulatory Visit: Payer: Self-pay | Admitting: Internal Medicine

## 2015-04-18 ENCOUNTER — Ambulatory Visit: Payer: Self-pay

## 2015-04-19 ENCOUNTER — Ambulatory Visit: Payer: Self-pay

## 2015-04-23 ENCOUNTER — Ambulatory Visit: Payer: Self-pay

## 2015-04-25 ENCOUNTER — Ambulatory Visit: Payer: Self-pay

## 2015-04-26 ENCOUNTER — Ambulatory Visit: Payer: Self-pay

## 2015-04-30 ENCOUNTER — Ambulatory Visit: Payer: Self-pay

## 2015-05-02 ENCOUNTER — Ambulatory Visit: Payer: Self-pay

## 2015-05-03 ENCOUNTER — Ambulatory Visit: Payer: Self-pay

## 2015-05-25 ENCOUNTER — Other Ambulatory Visit: Payer: Self-pay | Admitting: Internal Medicine

## 2015-05-28 ENCOUNTER — Other Ambulatory Visit: Payer: Self-pay

## 2015-05-28 MED ORDER — METOPROLOL SUCCINATE ER 25 MG PO TB24: 25.0000 mg | ORAL_TABLET | Freq: Every day | ORAL | Status: DC

## 2015-05-28 NOTE — Telephone Encounter (Signed)
Refill sent for metoprolol.  

## 2015-05-30 ENCOUNTER — Other Ambulatory Visit: Payer: Self-pay | Admitting: Internal Medicine

## 2015-05-31 NOTE — Telephone Encounter (Signed)
Last OV 3.8.16, last refill 5.24.16.  Please advise refill

## 2015-05-31 NOTE — Telephone Encounter (Signed)
rx faxed

## 2015-06-18 ENCOUNTER — Ambulatory Visit (INDEPENDENT_AMBULATORY_CARE_PROVIDER_SITE_OTHER)
Admission: RE | Admit: 2015-06-18 | Discharge: 2015-06-18 | Disposition: A | Discharge status: Home / Self Care | Payer: Medicare Other | Source: Ambulatory Visit | Attending: Internal Medicine | Admitting: Internal Medicine

## 2015-06-18 ENCOUNTER — Encounter: Payer: Self-pay | Admitting: Internal Medicine

## 2015-06-18 ENCOUNTER — Ambulatory Visit (INDEPENDENT_AMBULATORY_CARE_PROVIDER_SITE_OTHER): Payer: Medicare Other | Admitting: Internal Medicine

## 2015-06-18 VITALS — BP 135/75 | HR 73 | Temp 98.0°F | Ht 66.0 in | Wt 150.4 lb

## 2015-06-18 DIAGNOSIS — M25476 Effusion, unspecified foot: Secondary | ICD-10-CM | POA: Insufficient documentation

## 2015-06-18 DIAGNOSIS — I8390 Asymptomatic varicose veins of unspecified lower extremity: Secondary | ICD-10-CM | POA: Insufficient documentation

## 2015-06-18 DIAGNOSIS — R42 Dizziness and giddiness: Secondary | ICD-10-CM | POA: Diagnosis not present

## 2015-06-18 DIAGNOSIS — M25474 Effusion, right foot: Secondary | ICD-10-CM

## 2015-06-18 DIAGNOSIS — R200 Anesthesia of skin: Secondary | ICD-10-CM

## 2015-06-18 DIAGNOSIS — I8391 Asymptomatic varicose veins of right lower extremity: Secondary | ICD-10-CM | POA: Diagnosis not present

## 2015-06-18 NOTE — Assessment & Plan Note (Signed)
Will get plain xray to evaluate for fracture.

## 2015-06-18 NOTE — Assessment & Plan Note (Signed)
Varicosity of right thigh. Will set up vascular evaluation for venous dopplers.

## 2015-06-18 NOTE — Patient Instructions (Signed)
Xray of right foot at [facility].  We will set up an evaluation with Dr. Wyn Quaker in Vascular Surgery.

## 2015-06-18 NOTE — Assessment & Plan Note (Signed)
Recent episodes of vertigo. Likely related to BPV, however given new symptoms of right foot numbness as well, CVA also consideration. Will get MRI brain for further evaluation.

## 2015-06-18 NOTE — Assessment & Plan Note (Signed)
Right foot focal numbness and swelling. Will get plain xray to check for occult fracture. Discussed EMG testing if numbness persistent. Follow up after xray.

## 2015-06-18 NOTE — Progress Notes (Signed)
Pre visit review using our clinic review tool, if applicable. No additional management support is needed unless otherwise documented below in the visit note. 

## 2015-06-18 NOTE — Progress Notes (Signed)
Subjective:    Patient ID: Tami Skinner, female    DOB: 1945/12/07, 69 y.o.   MRN: [id number]  HPI  69YO female presents for acute visit.  Numbness - Right foot near great toe, over last 3-4 months. No other focal symptoms. Mild swelling noted at this site. No trauma to area. No pain. No weakness. No overlying skin changes.  Having veins in right thigh that are painful and pulsate at times. Worsened with prolonged standing. No skin ulceration. Also has small spider veins which are much worse on right lower leg versus left lower leg.  Dizziness - Wakes with dizziness and nausea over last 2-3 months. No headache. No vomiting. No CP, dyspnea. Symptoms improved after a few minutes without intervention. No recent falls or head trauma. No other focal neurologic symptoms except for numbness described above.     Past Medical History  Diagnosis Date  . COPD (chronic obstructive pulmonary disease)   . Diverticulitis   . Diabetes mellitus   . S/P colonoscopy 2010    Dr. Servando Snare  . Hypotension   . Strep throat   . Diarrhea   . Weakness   . Bronchitis, acute, with bronchospasm   . Asthmatic bronchitis   . Constipation, chronic   . Sleep apnea   . Facial pain   . Fibrocystic breast disease   . Neck pain   . Anxiety   . Osteopenia   . Hyperlipidemia   . Diverticulosis   . Stress-induced cardiomyopathy     a. echo 09/28/2014: EF 45-50%, severe HK of mid-distal anterior, apical and mid-distal inferior walls suggestive of stress induced CM, mildly dilated LA, mildly dilated PASP, mild TR, trivial pericardial effusion  . Hypertension    Family History  Problem Relation Age of Onset  . Diabetes Daughter   . Kidney disease Daughter   . Cancer Maternal Aunt     breast  . Diabetes Maternal Grandmother   . Cancer Cousin     colon  . Lung cancer Mother     smoked  . COPD Mother     smoked  . COPD Father     smoked   Past Surgical History  Procedure Laterality Date  . Abdominal  hysterectomy      partial  . Nasal sinus surgery    . Cardiac catheterization  09/29/2014    [facility]   Social History   Social History  . Marital Status: Married    Spouse Name: N/A  . Number of Children: N/A  . Years of Education: N/A   Social History Main Topics  . Smoking status: Former Smoker -- 0.25 packs/day for 40 years    Types: Cigarettes    Quit date: 11/24/2012  . Smokeless tobacco: Never Used     Comment: Uses nicotrol inhaler on occasion  . Alcohol Use: No  . Drug Use: No  . Sexual Activity: Not Asked   Other Topics Concern  . None   Social History Narrative   Lives with husband in [place]. 2 dogs. Works at [employer].    Review of Systems  Constitutional: Negative for fever, chills, appetite change, fatigue and unexpected weight change.  Eyes: Negative for visual disturbance.  Respiratory: Negative for cough and shortness of breath.   Cardiovascular: Negative for chest pain and leg swelling.  Gastrointestinal: Positive for nausea. Negative for vomiting, abdominal pain, diarrhea and constipation.  Skin: Negative for color change and rash.  Neurological: Positive for dizziness, light-headedness and numbness. Negative for seizures,  syncope, facial asymmetry, speech difficulty and weakness.  Hematological: Negative for adenopathy. Does not bruise/bleed easily.  Psychiatric/Behavioral: Negative for dysphoric mood. The patient is not nervous/anxious.        Objective:    BP 135/75 mmHg  Pulse 73  Temp(Src) 98 F (36.7 C) (Oral)  Ht 5\' 6"  (1.676 m)  Wt 150 lb 6 oz (68.21 kg)  BMI 24.28 kg/m2  SpO2 95% Physical Exam  Constitutional: She is oriented to person, place, and time. She appears well-developed and well-nourished. No distress.  HENT:  Head: Normocephalic and atraumatic.  Right Ear: External ear normal.  Left Ear: External ear normal.  Nose: Nose normal.  Mouth/Throat: Oropharynx is clear and moist. No oropharyngeal exudate.  Eyes: Conjunctivae are  normal. Pupils are equal, round, and reactive to light. Right eye exhibits no discharge. Left eye exhibits no discharge. No scleral icterus.  Neck: Normal range of motion. Neck supple. No tracheal deviation present. No thyromegaly present.  Cardiovascular: Normal rate, regular rhythm, normal heart sounds and intact distal pulses.  Exam reveals no gallop and no friction rub.   No murmur heard. Pulmonary/Chest: Effort normal and breath sounds normal. No respiratory distress. She has no wheezes. She has no rales. She exhibits no tenderness.  Musculoskeletal: Normal range of motion. She exhibits no edema or tenderness.       Right foot: There is swelling. There is normal range of motion, no tenderness, no deformity and no laceration.       Feet:  Lymphadenopathy:    She has no cervical adenopathy.  Neurological: She is alert and oriented to person, place, and time. No cranial nerve deficit. She exhibits normal muscle tone. Coordination normal.  Skin: Skin is warm and dry. No rash noted. She is not diaphoretic. No erythema. No pallor.  Spider varicosities bilateral LE R>L leg. Large varicosity right medial thigh. No overlying skin changes.  Psychiatric: She has a normal mood and affect. Her behavior is normal. Judgment and thought content normal.          Assessment & Plan:   Problem List Items Addressed This Visit      Unprioritized   Dizziness    Recent episodes of vertigo. Likely related to BPV, however given new symptoms of right foot numbness as well, CVA also consideration. Will get MRI brain for further evaluation.      Relevant Orders   MR Brain Wo Contrast   Numbness - Primary    Right foot focal numbness and swelling. Will get plain xray to check for occult fracture. Discussed EMG testing if numbness persistent. Follow up after xray.      Swelling of foot joint    Will get plain xray to evaluate for fracture.      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 Will set up vascular evaluation for venous dopplers.      Relevant Orders   Ambulatory referral to Vascular Surgery       Return in about 4 weeks (around 07/16/2015) for Recheck.

## 2015-06-21 ENCOUNTER — Ambulatory Visit
Admission: RE | Admit: 2015-06-21 | Discharge: 2015-06-21 | Disposition: A | Discharge status: Home / Self Care | Payer: Medicare Other | Source: Ambulatory Visit | Attending: Internal Medicine | Admitting: Internal Medicine

## 2015-06-21 DIAGNOSIS — I679 Cerebrovascular disease, unspecified: Secondary | ICD-10-CM | POA: Insufficient documentation

## 2015-06-21 DIAGNOSIS — R200 Anesthesia of skin: Secondary | ICD-10-CM | POA: Insufficient documentation

## 2015-06-21 DIAGNOSIS — R42 Dizziness and giddiness: Secondary | ICD-10-CM | POA: Diagnosis present

## 2015-07-07 ENCOUNTER — Other Ambulatory Visit: Payer: Self-pay | Admitting: Internal Medicine

## 2015-07-09 NOTE — Telephone Encounter (Signed)
Last OV 8.15.16.  Please advise refill 

## 2015-07-23 ENCOUNTER — Encounter: Payer: Self-pay | Admitting: Cardiovascular Disease

## 2015-07-23 ENCOUNTER — Ambulatory Visit (INDEPENDENT_AMBULATORY_CARE_PROVIDER_SITE_OTHER): Payer: Medicare Other | Admitting: Cardiovascular Disease

## 2015-07-23 VITALS — BP 100/72 | HR 69 | Ht 66.0 in | Wt 147.5 lb

## 2015-07-23 DIAGNOSIS — I10 Essential (primary) hypertension: Secondary | ICD-10-CM | POA: Diagnosis not present

## 2015-07-23 DIAGNOSIS — R079 Chest pain, unspecified: Secondary | ICD-10-CM

## 2015-07-23 DIAGNOSIS — I5181 Takotsubo syndrome: Secondary | ICD-10-CM

## 2015-07-23 DIAGNOSIS — R0602 Shortness of breath: Secondary | ICD-10-CM | POA: Diagnosis not present

## 2015-07-23 DIAGNOSIS — E785 Hyperlipidemia, unspecified: Secondary | ICD-10-CM

## 2015-07-23 NOTE — Progress Notes (Signed)
Primary care physician: Dr. Victorino Dike walker  HPI  This is a pleasant 69 year old female who is here today for a follow-up visit for stress-induced cardiomyopathy. She was hospitalized in November, 2015 with chest pain and shortness of breath with mildly elevated cardiac enzymes. Cardiac catheterization showed no evidence of obstructive coronary artery disease. Ejection fraction was 40-45% with wall motion abnormality suggestive of stress-induced cardiomyopathy. She has been under significant stress due to the fact that her husband is an alcoholic.  She reports some worsening of dyspnea with occasional chest pain when she is under stress. She continues to take her medications.  Allergies  Allergen Reactions  . Amoxicillin-Pot Clavulanate     REACTION: stomach sensitive.  Lenice Llamas [Roflumilast]     Severe nausea   . Erythromycin Base     REACTION: GI upset  . Neurontin [Gabapentin]     anxiety     Current Outpatient Prescriptions on File Prior to Visit  Medication Sig Dispense Refill  . ADVAIR DISKUS 250-50 MCG/DOSE AEPB INHALE ONE PUFF INTO THE LUNGS TWICE DAILY 60 each 5  . albuterol (PROVENTIL) (2.5 MG/3ML) 0.083% nebulizer solution Take 3 mLs (2.5 mg total) by nebulization every 6 (six) hours as needed for wheezing or shortness of breath. 75 mL 12  . ALPRAZolam (XANAX) 0.5 MG tablet TAKE 1 TABLET BY MOUTH 3 TIMES A DAY AS NEEDED 90 tablet 1  . cetirizine (ZYRTEC) 10 MG tablet Take 10 mg by mouth daily.    . Cholecalciferol (VITAMIN D-3) 1000 UNITS CAPS Take 2 capsules by mouth.     . CRESTOR 5 MG tablet TAKE 1 TABLET BY MOUTH EVERY DAY 90 tablet 1  . glucose blood (ONE TOUCH ULTRA TEST) test strip 1 each by Other route 2 (two) times daily as needed for other. Use as instructed 100 each 2  . meclizine (ANTIVERT) 25 MG tablet Take 25 mg by mouth as needed for dizziness.    . metFORMIN (GLUCOPHAGE) 500 MG tablet TAKE 1 TABLET BY MOUTH TWICE A DAY WITH A MEAL 60 tablet 6  . metoprolol  succinate (TOPROL-XL) 25 MG 24 hr tablet Take 1 tablet (25 mg total) by mouth daily. 30 tablet 6  . OXYGEN-HELIUM IN Inhale 2 % into the lungs at bedtime.      . sertraline (ZOLOFT) 100 MG tablet TAKE 1 TABLET (100 MG TOTAL) BY MOUTH 2 (TWO) TIMES DAILY. 60 tablet 2  . SPIRIVA HANDIHALER 18 MCG inhalation capsule Place 18 mcg into inhaler and inhale daily.     . valsartan (DIOVAN) 160 MG tablet TAKE 1 TABLET BY MOUTH EVERY DAY 30 tablet 3  . VENTOLIN HFA 108 (90 BASE) MCG/ACT inhaler INHALE TWO PUFFS INTO THE LUNGS EVERY FOUR HOURS AS NEEDED FOR WHEEZING 18 Inhaler 0   No current facility-administered medications on file prior to visit.     Past Medical History  Diagnosis Date  . COPD (chronic obstructive pulmonary disease)   . Diverticulitis   . Diabetes mellitus   . S/P colonoscopy 2010    Dr. Servando Snare  . Hypotension   . Strep throat   . Diarrhea   . Weakness   . Bronchitis, acute, with bronchospasm   . Asthmatic bronchitis   . Constipation, chronic   . Sleep apnea   . Facial pain   . Fibrocystic breast disease   . Neck pain   . Anxiety   . Osteopenia   . Hyperlipidemia   . Diverticulosis   . Stress-induced cardiomyopathy  a. echo 09/28/2014: EF 45-50%, severe HK of mid-distal anterior, apical and mid-distal inferior walls suggestive of stress induced CM, mildly dilated LA, mildly dilated PASP, mild TR, trivial pericardial effusion  . Hypertension      Past Surgical History  Procedure Laterality Date  . Abdominal hysterectomy      partial  . Nasal sinus surgery    . Cardiac catheterization  09/29/2014    [facility]     Family History  Problem Relation Age of Onset  . Diabetes Daughter   . Kidney disease Daughter   . Cancer Maternal Aunt     breast  . Diabetes Maternal Grandmother   . Cancer Cousin     colon  . Lung cancer Mother     smoked  . COPD Mother     smoked  . COPD Father     smoked     Social History   Social History  . Marital Status: Married     Spouse Name: N/A  . Number of Children: N/A  . Years of Education: N/A   Occupational History  . Not on file.   Social History Main Topics  . Smoking status: Former Smoker -- 0.25 packs/day for 40 years    Types: Cigarettes    Quit date: 11/24/2012  . Smokeless tobacco: Never Used     Comment: Uses nicotrol inhaler on occasion  . Alcohol Use: No  . Drug Use: No  . Sexual Activity: Not on file   Other Topics Concern  . Not on file   Social History Narrative   Lives with husband in [place]. 2 dogs. Works at [employer].      PHYSICAL EXAM   BP 100/72 mmHg  Pulse 69  Ht  (1.676 m)  Wt 147 lb 8 oz (66.906 kg)  BMI 23.82 kg/m2 Constitutional: She is oriented to person, place, and time. She appears well-developed and well-nourished. No distress.  HENT: No nasal discharge.  Head: Normocephalic and atraumatic.  Eyes: Pupils are equal and round. No discharge.  Neck: Normal range of motion. Neck supple. No JVD present. No thyromegaly present.  Cardiovascular: Normal rate, regular rhythm, normal heart sounds. Exam reveals no gallop and no friction rub. No murmur heard.  Pulmonary/Chest: Effort normal and breath sounds normal. No stridor. No respiratory distress. She has no wheezes. She has no rales. She exhibits no tenderness.  Abdominal: Soft. Bowel sounds are normal. She exhibits no distension. There is no tenderness. There is no rebound and no guarding.  Musculoskeletal: Normal range of motion. She exhibits no edema and no tenderness.  Neurological: She is alert and oriented to person, place, and time. Coordination normal.  Skin: Skin is warm and dry. No rash noted. She is not diaphoretic. No erythema. No pallor.  Psychiatric: She has a normal mood and affect. Her behavior is normal. Judgment and thought content normal.    ZOX:WRUEA  Rhythm  -Left axis.   Low voltage with rightward P-axis and rotation -possible pulmonary disease.   ABNORMAL      ASSESSMENT AND  PLAN

## 2015-07-23 NOTE — Assessment & Plan Note (Signed)
The patient reports some worsening of dyspnea. Thus, I requested an echocardiogram to reevaluate her LV systolic function. Continue treatment with metoprolol and valsartan.

## 2015-07-23 NOTE — Patient Instructions (Signed)
Medication Instructions:  Your physician recommends that you continue on your current medications as directed. Please refer to the Current Medication list given to you today.   Labwork: none  Testing/Procedures: Your physician has requested that you have an echocardiogram. Echocardiography is a painless test that uses sound waves to create images of your heart. It provides your doctor with information about the size and shape of your heart and how well your heart's chambers and valves are working. This procedure takes approximately one hour. There are no restrictions for this procedure.    Follow-Up: Your physician wants you to follow-up in: six months with Dr. Arida.  You will receive a reminder letter in the mail two months in advance. If you don't receive a letter, please call our office to schedule the follow-up appointment.   Any Other Special Instructions Will Be Listed Below (If Applicable).  Echocardiogram An echocardiogram, or echocardiography, uses sound waves (ultrasound) to produce an image of your heart. The echocardiogram is simple, painless, obtained within a short period of time, and offers valuable information to your health care provider. The images from an echocardiogram can provide information such as:  Evidence of coronary artery disease (CAD).  Heart size.  Heart muscle function.  Heart valve function.  Aneurysm detection.  Evidence of a past heart attack.  Fluid buildup around the heart.  Heart muscle thickening.  Assess heart valve function. LET YOUR HEALTH CARE PROVIDER KNOW ABOUT:  Any allergies you have.  All medicines you are taking, including vitamins, herbs, eye drops, creams, and over-the-counter medicines.  Previous problems you or members of your family have had with the use of anesthetics.  Any blood disorders you have.  Previous surgeries you have had.  Medical conditions you have.  Possibility of pregnancy, if this  applies. BEFORE THE PROCEDURE  No special preparation is needed. Eat and drink normally.  PROCEDURE   In order to produce an image of your heart, gel will be applied to your chest and a wand-like tool (transducer) will be moved over your chest. The gel will help transmit the sound waves from the transducer. The sound waves will harmlessly bounce off your heart to allow the heart images to be captured in real-time motion. These images will then be recorded.  You may need an IV to receive a medicine that improves the quality of the pictures. AFTER THE PROCEDURE You may return to your normal schedule including diet, activities, and medicines, unless your health care provider tells you otherwise. Document Released: 10/17/2000 Document Revised: 03/06/2014 Document Reviewed: 06/27/2013 ExitCare Patient Information 2015 [employer]. This information is not intended to replace advice given to you by your health care provider. Make sure you discuss any questions you have with your health care provider.  

## 2015-07-23 NOTE — Assessment & Plan Note (Signed)
She is tolerating small dose rosuvastatin.  Lab Results  Component Value Date   CHOL 141 01/09/2015   HDL 61.40 01/09/2015   LDLCALC 42 01/09/2015   LDLDIRECT 144.4 10/11/2013   TRIG 188.0* 01/09/2015   CHOLHDL 2 01/09/2015

## 2015-07-23 NOTE — Assessment & Plan Note (Signed)
Blood pressure is controlled on current medications. 

## 2015-07-24 ENCOUNTER — Ambulatory Visit: Payer: Self-pay | Admitting: Internal Medicine

## 2015-07-25 ENCOUNTER — Encounter: Payer: Self-pay | Admitting: Internal Medicine

## 2015-07-25 ENCOUNTER — Other Ambulatory Visit: Payer: Self-pay | Admitting: Internal Medicine

## 2015-07-25 ENCOUNTER — Ambulatory Visit (INDEPENDENT_AMBULATORY_CARE_PROVIDER_SITE_OTHER): Payer: Medicare Other | Admitting: Internal Medicine

## 2015-07-25 VITALS — BP 135/82 | HR 76 | Temp 98.3°F | Ht 66.0 in | Wt 149.0 lb

## 2015-07-25 DIAGNOSIS — R51 Headache: Secondary | ICD-10-CM | POA: Diagnosis not present

## 2015-07-25 DIAGNOSIS — R42 Dizziness and giddiness: Secondary | ICD-10-CM

## 2015-07-25 DIAGNOSIS — R519 Headache, unspecified: Secondary | ICD-10-CM

## 2015-07-25 DIAGNOSIS — I8391 Asymptomatic varicose veins of right lower extremity: Secondary | ICD-10-CM | POA: Diagnosis not present

## 2015-07-25 DIAGNOSIS — Z23 Encounter for immunization: Secondary | ICD-10-CM | POA: Diagnosis not present

## 2015-07-25 MED ORDER — ALBUTEROL SULFATE HFA 108 (90 BASE) MCG/ACT IN AERS: INHALATION_SPRAY | RESPIRATORY_TRACT | Status: DC

## 2015-07-25 MED ORDER — MECLIZINE HCL 25 MG PO TABS: 25.0000 mg | ORAL_TABLET | ORAL | Status: DC | PRN

## 2015-07-25 MED ORDER — NORTRIPTYLINE HCL 10 MG PO CAPS: 10.0000 mg | ORAL_CAPSULE | Freq: Every day | ORAL | Status: DC

## 2015-07-25 NOTE — Assessment & Plan Note (Signed)
Symptoms are most consistent with BPV, however minimal improvement with Meclizine and maneuver.  Question if migraine might be contributing. Discussed referral to Neurology, but she declines for now. Will start Pamelor. Follow up in 2-4 weeks.

## 2015-07-25 NOTE — Progress Notes (Signed)
Pre visit review using our clinic review tool, if applicable. No additional management support is needed unless otherwise documented below in the visit note. 

## 2015-07-25 NOTE — Assessment & Plan Note (Signed)
Recent intermittent headaches. Question migraine. MRI brain showed no acute findings. Will start Pamelor  at bedtime. Follow up in 2-4 weeks. Discussed referral to neurology, however she would like to hold off for now.

## 2015-07-25 NOTE — Assessment & Plan Note (Signed)
Recently seen by vascular. Venous and arterial US pending.

## 2015-07-25 NOTE — Patient Instructions (Addendum)
Start Pamelor  daily at bedtime to help prevent headaches and nausea.  Follow up 4 weeks.

## 2015-07-25 NOTE — Progress Notes (Signed)
Subjective:    Patient ID: Tami Skinner, female    DOB: Mar 16, 1946, 69 y.o.   MRN: [id number]  HPI  69YO female presents for follow up.  Seen by Dr. Kirke Corin 9/19. Scheduled for ECHO to evaluate LVF. Yesterday, seen at vascular. Recommended compression hose. Planning to get arterial and venous dopplers. Continues to feel dizzy. Not every day. Described as vertigo. Seen by ENT in past, and tried maneuver for BPV with no improvement. Mild improvement with Meclizine. Also has occasional severe headache. Described as diffuse. This does not always occur with episodes of dizziness. No visual changes. No photo or phonophobia. She does not take anything for this. Has a history of migraines in the distant past.   Wt Readings from Last 3 Encounters:  07/25/15 149 lb (67.586 kg)  07/23/15 147 lb 8 oz (66.906 kg)  06/21/15 150 lb (68.04 kg)   BP Readings from Last 3 Encounters:  07/25/15 135/82  07/23/15 100/72  06/18/15 135/75     Past Medical History  Diagnosis Date  . COPD (chronic obstructive pulmonary disease)   . Diverticulitis   . Diabetes mellitus   . S/P colonoscopy 2010    Dr. Servando Snare  . Hypotension   . Strep throat   . Diarrhea   . Weakness   . Bronchitis, acute, with bronchospasm   . Asthmatic bronchitis   . Constipation, chronic   . Sleep apnea   . Facial pain   . Fibrocystic breast disease   . Neck pain   . Anxiety   . Osteopenia   . Hyperlipidemia   . Diverticulosis   . Stress-induced cardiomyopathy     a. echo 09/28/2014: EF 45-50%, severe HK of mid-distal anterior, apical and mid-distal inferior walls suggestive of stress induced CM, mildly dilated LA, mildly dilated PASP, mild TR, trivial pericardial effusion  . Hypertension    Family History  Problem Relation Age of Onset  . Diabetes Daughter   . Kidney disease Daughter   . Cancer Maternal Aunt     breast  . Diabetes Maternal Grandmother   . Cancer Cousin     colon  . Lung cancer Mother     smoked    . COPD Mother     smoked  . COPD Father     smoked   Past Surgical History  Procedure Laterality Date  . Abdominal hysterectomy      partial  . Nasal sinus surgery    . Cardiac catheterization  09/29/2014    [facility]   Social History   Social History  . Marital Status: Married    Spouse Name: N/A  . Number of Children: N/A  . Years of Education: N/A   Social History Main Topics  . Smoking status: Former Smoker -- 0.25 packs/day for 40 years    Types: Cigarettes    Quit date: 11/24/2012  . Smokeless tobacco: Never Used     Comment: Uses nicotrol inhaler on occasion  . Alcohol Use: No  . Drug Use: No  . Sexual Activity: Not Asked   Other Topics Concern  . None   Social History Narrative   Lives with husband in [place]. 2 dogs. Works at [employer].    Review of Systems  Constitutional: Negative for fever, chills, appetite change, fatigue and unexpected weight change.  Eyes: Negative for visual disturbance.  Respiratory: Negative for shortness of breath.   Cardiovascular: Negative for chest pain and leg swelling.  Gastrointestinal: Positive for nausea. Negative for vomiting, abdominal  pain, diarrhea and constipation.  Musculoskeletal: Negative for myalgias and arthralgias.  Skin: Negative for color change and rash.  Neurological: Positive for dizziness and light-headedness. Negative for tremors, syncope and weakness.  Hematological: Negative for adenopathy. Does not bruise/bleed easily.  Psychiatric/Behavioral: Negative for sleep disturbance and dysphoric mood. The patient is not nervous/anxious.        Objective:    BP 135/82 mmHg  Pulse 76  Temp(Src) 98.3 F (36.8 C) (Oral)  Ht  (1.676 m)  Wt 149 lb (67.586 kg)  BMI 24.06 kg/m2  SpO2 95% Physical Exam  Constitutional: She is oriented to person, place, and time. She appears well-developed and well-nourished. No distress.  HENT:  Head: Normocephalic and atraumatic.  Right Ear: External ear normal.  Left  Ear: External ear normal.  Nose: Nose normal.  Mouth/Throat: Oropharynx is clear and moist. No oropharyngeal exudate.  Eyes: Conjunctivae are normal. Pupils are equal, round, and reactive to light. Right eye exhibits no discharge. Left eye exhibits no discharge. No scleral icterus.  Neck: Normal range of motion. Neck supple. No tracheal deviation present. No thyromegaly present.  Cardiovascular: Normal rate, regular rhythm, normal heart sounds and intact distal pulses.  Exam reveals no gallop and no friction rub.   No murmur heard. Pulmonary/Chest: Effort normal and breath sounds normal. No respiratory distress. She has no wheezes. She has no rales. She exhibits no tenderness.  Musculoskeletal: Normal range of motion. She exhibits no edema or tenderness.  Lymphadenopathy:    She has no cervical adenopathy.  Neurological: She is alert and oriented to person, place, and time. No cranial nerve deficit. She exhibits normal muscle tone. Coordination normal.  Skin: Skin is warm and dry. No rash noted. She is not diaphoretic. No erythema. No pallor.  Psychiatric: She has a normal mood and affect. Her behavior is normal. Judgment and thought content normal.          Assessment & Plan:   Problem List Items Addressed This Visit      Unprioritized   Dizziness - Primary    Symptoms are most consistent with BPV, however minimal improvement with Meclizine and maneuver.  Question if migraine might be contributing. Discussed referral to Neurology, but she declines for now. Will start Pamelor. Follow up in 2-4 weeks.      Headache    Recent intermittent headaches. Question migraine. MRI brain showed no acute findings. Will start Pamelor  at bedtime. Follow up in 2-4 weeks. Discussed referral to neurology, however she would like to hold off for now.      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 Venous and arterial US pending.           Return in about 4 weeks (around 08/22/2015) for Recheck.

## 2015-08-14 ENCOUNTER — Other Ambulatory Visit: Payer: Self-pay

## 2015-08-14 ENCOUNTER — Ambulatory Visit (INDEPENDENT_AMBULATORY_CARE_PROVIDER_SITE_OTHER): Payer: Medicare Other

## 2015-08-14 DIAGNOSIS — R0602 Shortness of breath: Secondary | ICD-10-CM

## 2015-09-03 ENCOUNTER — Other Ambulatory Visit: Payer: Self-pay | Admitting: Internal Medicine

## 2015-09-14 ENCOUNTER — Other Ambulatory Visit: Payer: Self-pay | Admitting: Internal Medicine

## 2015-10-01 ENCOUNTER — Other Ambulatory Visit: Payer: Self-pay | Admitting: Internal Medicine

## 2015-10-02 ENCOUNTER — Other Ambulatory Visit: Payer: Self-pay | Admitting: Internal Medicine

## 2015-10-05 ENCOUNTER — Telehealth: Payer: Self-pay

## 2015-10-05 NOTE — Telephone Encounter (Signed)
S/w pt who called to inquire of echo results.  Informed pt message was left on home phone 10/19 w/results. Reviewed results w/pt who verbalized understanding and is appreciative of the call. No further questions.

## 2015-10-10 ENCOUNTER — Telehealth: Payer: Self-pay | Admitting: Internal Medicine

## 2015-10-10 ENCOUNTER — Other Ambulatory Visit: Payer: Self-pay | Admitting: Internal Medicine

## 2015-10-10 DIAGNOSIS — E119 Type 2 diabetes mellitus without complications: Secondary | ICD-10-CM

## 2015-10-10 DIAGNOSIS — Z1159 Encounter for screening for other viral diseases: Secondary | ICD-10-CM

## 2015-10-10 NOTE — Telephone Encounter (Signed)
Pt called about needing to check her A1c and wanting to get her Hep C screening? Pt comes in on 10/17/2015. Need orders please and thank you!

## 2015-10-11 ENCOUNTER — Encounter: Payer: Self-pay | Admitting: Nurse Practitioner

## 2015-10-15 ENCOUNTER — Other Ambulatory Visit: Payer: Self-pay | Admitting: Internal Medicine

## 2015-10-17 ENCOUNTER — Encounter: Payer: Self-pay | Admitting: Internal Medicine

## 2015-10-17 ENCOUNTER — Other Ambulatory Visit: Payer: Self-pay | Admitting: Internal Medicine

## 2015-10-17 ENCOUNTER — Ambulatory Visit (INDEPENDENT_AMBULATORY_CARE_PROVIDER_SITE_OTHER): Payer: Medicare Other | Admitting: Internal Medicine

## 2015-10-17 VITALS — BP 121/77 | HR 76 | Temp 98.1°F | Ht 66.0 in | Wt 144.0 lb

## 2015-10-17 DIAGNOSIS — R32 Unspecified urinary incontinence: Secondary | ICD-10-CM | POA: Diagnosis not present

## 2015-10-17 DIAGNOSIS — Z1159 Encounter for screening for other viral diseases: Secondary | ICD-10-CM | POA: Diagnosis not present

## 2015-10-17 DIAGNOSIS — H8110 Benign paroxysmal vertigo, unspecified ear: Secondary | ICD-10-CM | POA: Diagnosis not present

## 2015-10-17 DIAGNOSIS — E119 Type 2 diabetes mellitus without complications: Secondary | ICD-10-CM

## 2015-10-17 LAB — COMPREHENSIVE METABOLIC PANEL
ALK PHOS: 51 U/L (ref 39–117)
ALT: 13 U/L (ref 0–35)
AST: 17 U/L (ref 0–37)
Albumin: 4.3 g/dL (ref 3.5–5.2)
BUN: 13 mg/dL (ref 6–23)
CHLORIDE: 101 meq/L (ref 96–112)
CO2: 32 mEq/L (ref 19–32)
Calcium: 9.8 mg/dL (ref 8.4–10.5)
Creatinine, Ser: 0.85 mg/dL (ref 0.40–1.20)
GFR: 70.26 mL/min (ref >60.00)
GLUCOSE: 111 mg/dL — AB (ref 70–99)
POTASSIUM: 4.8 meq/L (ref 3.5–5.1)
SODIUM: 138 meq/L (ref 135–145)
TOTAL PROTEIN: 6.7 g/dL (ref 6.0–8.3)
Total Bilirubin: 0.3 mg/dL (ref 0.2–1.2)

## 2015-10-17 LAB — LIPID PANEL
CHOL/HDL RATIO: 2
Cholesterol: 150 mg/dL (ref 0–200)
HDL: 66.6 mg/dL (ref >39.00)
LDL Cholesterol: 66 mg/dL (ref 0–99)
NONHDL: 83.05
TRIGLYCERIDES: 83 mg/dL (ref 0.0–149.0)
VLDL: 16.6 mg/dL (ref 0.0–40.0)

## 2015-10-17 LAB — HEMOGLOBIN A1C: Hgb A1c MFr Bld: 6.7 % — ABNORMAL HIGH (ref 4.6–6.5)

## 2015-10-17 MED ORDER — SOLIFENACIN SUCCINATE 10 MG PO TABS: 10.0000 mg | ORAL_TABLET | Freq: Every day | ORAL | Status: DC

## 2015-10-17 NOTE — Progress Notes (Signed)
Pre visit review using our clinic review tool, if applicable. No additional management support is needed unless otherwise documented below in the visit note. 

## 2015-10-17 NOTE — Progress Notes (Signed)
Subjective:    Patient ID: Tami Skinner, female    DOB: [birth year], 69 y.o.   MRN: [id number]  HPI  69YO female presents for acute visit.  Urinary incontinence - Unable to control urine. Has some urgency with leakage. Tried taking Toviaz, but had mouth dryness with this and stopped. Symptoms have negative impact on quality of life. She has soaked through her clothes and onto her car seat. No dysuria. No previous bladder surgery. However, did have a 9lb vaginal delivery.  Recently seen by [facility]. Treated for BPV. Insurance will not cover Meclizine. Questions other options.  DM - Would like to check A1c with labs. Compliant with medications. Did not bring record of BG.    Wt Readings from Last 3 Encounters:  10/17/15 144 lb (65.318 kg)  07/25/15 149 lb (67.586 kg)  07/23/15 147 lb 8 oz (66.906 kg)   BP Readings from Last 3 Encounters:  10/17/15 121/77  07/25/15 135/82  07/23/15 100/72    Past Medical History  Diagnosis Date  . COPD (chronic obstructive pulmonary disease)   . Diverticulitis   . Diabetes mellitus   . S/P colonoscopy 2010    Dr. Servando Snare  . Hypotension   . Strep throat   . Diarrhea   . Weakness   . Bronchitis, acute, with bronchospasm   . Asthmatic bronchitis   . Constipation, chronic   . Sleep apnea   . Facial pain   . Fibrocystic breast disease   . Neck pain   . Anxiety   . Osteopenia   . Hyperlipidemia   . Diverticulosis   . Stress-induced cardiomyopathy     a. echo 09/28/2014: EF 45-50%, severe HK of mid-distal anterior, apical and mid-distal inferior walls suggestive of stress induced CM, mildly dilated LA, mildly dilated PASP, mild TR, trivial pericardial effusion  . Hypertension    Family History  Problem Relation Age of Onset  . Diabetes Daughter   . Kidney disease Daughter   . Cancer Maternal Aunt     breast  . Diabetes Maternal Grandmother   . Cancer Cousin     colon  . Lung cancer Mother     smoked  . COPD Mother     smoked   . COPD Father     smoked   Past Surgical History  Procedure Laterality Date  . Abdominal hysterectomy      partial  . Nasal sinus surgery    . Cardiac catheterization  09/29/2014    [facility]   Social History   Social History  . Marital Status: Married    Spouse Name: N/A  . Number of Children: N/A  . Years of Education: N/A   Social History Main Topics  . Smoking status: Former Smoker -- 0.25 packs/day for 40 years    Types: Cigarettes    Quit date: 11/24/2012  . Smokeless tobacco: Never Used     Comment: Uses nicotrol inhaler on occasion  . Alcohol Use: No  . Drug Use: No  . Sexual Activity: Not on file   Other Topics Concern  . Not on file   Social History Narrative   Lives with husband in [place]. 2 dogs. Works at [employer].    Review of Systems  Constitutional: Negative for fever, chills, appetite change, fatigue and unexpected weight change.  Eyes: Negative for visual disturbance.  Respiratory: Negative for shortness of breath.   Cardiovascular: Negative for chest pain and leg swelling.  Gastrointestinal: Negative for nausea, vomiting, abdominal pain,  diarrhea and constipation.  Genitourinary: Positive for urgency. Negative for dysuria, frequency, difficulty urinating, vaginal pain and pelvic pain.  Musculoskeletal: Negative for myalgias and arthralgias.  Skin: Negative for color change and rash.  Hematological: Negative for adenopathy. Does not bruise/bleed easily.  Psychiatric/Behavioral: Negative for sleep disturbance and dysphoric mood. The patient is not nervous/anxious.        Objective:    BP 121/77 mmHg  Pulse 76  Temp(Src) 98.1 F (36.7 C) (Oral)  Ht 5\' 6"  (1.676 m)  Wt 144 lb (65.318 kg)  BMI 23.25 kg/m2  SpO2 92% Physical Exam  Constitutional: She is oriented to person, place, and time. She appears well-developed and well-nourished. No distress.  HENT:  Head: Normocephalic and atraumatic.  Right Ear: External ear normal.  Left Ear: External  ear normal.  Nose: Nose normal.  Mouth/Throat: Oropharynx is clear and moist. No oropharyngeal exudate.  Eyes: Conjunctivae are normal. Pupils are equal, round, and reactive to light. Right eye exhibits no discharge. Left eye exhibits no discharge. No scleral icterus.  Neck: Normal range of motion. Neck supple. No tracheal deviation present. No thyromegaly present.  Cardiovascular: Normal rate, regular rhythm, normal heart sounds and intact distal pulses.  Exam reveals no gallop and no friction rub.   No murmur heard. Pulmonary/Chest: Effort normal and breath sounds normal. No respiratory distress. She has no wheezes. She has no rales. She exhibits no tenderness.  Musculoskeletal: Normal range of motion. She exhibits no edema or tenderness.  Lymphadenopathy:    She has no cervical adenopathy.  Neurological: She is alert and oriented to person, place, and time. No cranial nerve deficit. She exhibits normal muscle tone. Coordination normal.  Skin: Skin is warm and dry. No rash noted. She is not diaphoretic. No erythema. No pallor.  Psychiatric: She has a normal mood and affect. Her behavior is normal. Judgment and thought content normal.          Assessment & Plan:   Problem List Items Addressed This Visit      Unprioritized   Absence of bladder continence - Primary    Worsening symptoms of bladder incontinence. Unable to tolerate Toviaz because of dry mouth. We discussed that this may be a side effect of other medications for incontinence. Will try Vesicare. Will also set up evaluation with urology. Question if she would be a candidate for surgical intervention.      Relevant Medications   solifenacin (VESICARE) 10 MG tablet   Other Relevant Orders   Ambulatory referral to Urology   POCT Urinalysis Dipstick   BPV (benign positional vertigo)    Symptoms improved with Meclizine. She will get this medication OTC to use prn. Follow up with ENT as scheduled.      Diabetes mellitus  type 2, controlled (HCC)    Will check A1c with labs.  Continue Metformin.      Relevant Orders   Comprehensive metabolic panel   Hemoglobin A1c   Lipid panel    Other Visit Diagnoses    Need for hepatitis C screening test        Relevant Orders    Hepatitis C antibody        Return in about 3 months (around 01/15/2016) for Recheck of Diabetes.

## 2015-10-17 NOTE — Assessment & Plan Note (Signed)
Symptoms improved with Meclizine. She will get this medication OTC to use prn. Follow up with ENT as scheduled.

## 2015-10-17 NOTE — Assessment & Plan Note (Signed)
Will check A1c with labs. Continue Metformin. 

## 2015-10-17 NOTE — Assessment & Plan Note (Signed)
Worsening symptoms of bladder incontinence. Unable to tolerate Toviaz because of dry mouth. We discussed that this may be a side effect of other medications for incontinence. Will try Vesicare. Will also set up evaluation with urology. Question if she would be a candidate for surgical intervention.

## 2015-10-17 NOTE — Patient Instructions (Signed)
Labs today.  We will set up evaluation with Urology. Start Vesicare 10mg  daily.  Follow up in 3 months.

## 2015-10-18 LAB — HEPATITIS C ANTIBODY: HCV AB: NEGATIVE

## 2015-10-30 ENCOUNTER — Other Ambulatory Visit: Payer: Self-pay | Admitting: Internal Medicine

## 2015-10-30 NOTE — Telephone Encounter (Signed)
Please advise 

## 2015-11-06 ENCOUNTER — Ambulatory Visit: Payer: Self-pay | Admitting: Urology

## 2015-11-20 ENCOUNTER — Other Ambulatory Visit: Payer: Self-pay | Admitting: Internal Medicine

## 2015-11-21 MED ORDER — ROSUVASTATIN CALCIUM 5 MG PO TABS: 5.0000 mg | ORAL_TABLET | Freq: Every day | ORAL | Status: DC

## 2015-11-27 ENCOUNTER — Encounter: Payer: Self-pay | Admitting: Internal Medicine

## 2015-11-27 ENCOUNTER — Other Ambulatory Visit: Payer: Self-pay | Admitting: Internal Medicine

## 2015-11-29 ENCOUNTER — Ambulatory Visit (INDEPENDENT_AMBULATORY_CARE_PROVIDER_SITE_OTHER): Payer: Medicare Other | Admitting: Urology

## 2015-11-29 ENCOUNTER — Encounter: Payer: Self-pay | Admitting: Urology

## 2015-11-29 VITALS — BP 143/114 | HR 80 | Ht 66.0 in | Wt 143.7 lb

## 2015-11-29 DIAGNOSIS — I10 Essential (primary) hypertension: Secondary | ICD-10-CM

## 2015-11-29 DIAGNOSIS — N3281 Overactive bladder: Secondary | ICD-10-CM

## 2015-11-29 DIAGNOSIS — R32 Unspecified urinary incontinence: Secondary | ICD-10-CM | POA: Diagnosis not present

## 2015-11-29 DIAGNOSIS — N3946 Mixed incontinence: Secondary | ICD-10-CM | POA: Diagnosis not present

## 2015-11-29 DIAGNOSIS — R339 Retention of urine, unspecified: Secondary | ICD-10-CM | POA: Diagnosis not present

## 2015-11-29 LAB — URINALYSIS, COMPLETE
BILIRUBIN UA: NEGATIVE
Glucose, UA: NEGATIVE
KETONES UA: NEGATIVE
Nitrite, UA: NEGATIVE
PH UA: 6.5 (ref 5.0–7.5)
PROTEIN UA: NEGATIVE
SPEC GRAV UA: 1.02 (ref 1.005–1.030)
UUROB: 0.2 mg/dL (ref 0.2–1.0)

## 2015-11-29 LAB — BLADDER SCAN AMB NON-IMAGING

## 2015-11-29 LAB — MICROSCOPIC EXAMINATION: RBC MICROSCOPIC, UA: NONE SEEN /HPF (ref 0–?)

## 2015-11-29 NOTE — Progress Notes (Signed)
11/29/2015 11:58 AM   [name] [birth year] [id number]  Referring provider: Shelia Media, MD [address] [place], [place] [address]  Chief Complaint  Patient presents with  . Urinary Incontinence    New Patient    HPI: 70 yo M referred for further work up for urinary urgency, frequency and urge incontinence.  Over the past 6 months, this has gotten much worse over the past 6 months to the point she miserable.  She wears depends, poise pads, and paper towels to keep herself dry.  She reports that she will have the urge to use the bathroom but can't get there in time on a regular basis.  She also has urinary frequency as much as every 15 min some days.   She does also have a small amount of leakage with laughing, coughing, and sneezing for may years but this does not bother her much and this is longstanding.  She has done Kegel exercises in the past for this but stopped doing then when she developed her urgency symptoms.  She was given Vesicare 10 mg which does help some.  She did also try another medication prior this (samples given but she cannot recal the name of the medication).  Both of these medications caused severe dry mouth.     She is a diabetic, type 2 since 2001, keeps blood sugar under good control.    She is not sexually active.  Two vaginal deliveries.   No vaginal pain or bulging.  No issues with UTIs in a few years.  She does complain that her urine smell bad at times which she relates to urinary stagnation.     Mrs. Coughlin is a former patient of Dr. Achilles Dunk ~15 years ago for microscopic hematuria work including a cystoscopy.    PMH: Past Medical History  Diagnosis Date  . COPD (chronic obstructive pulmonary disease) (HCC)   . Diverticulitis   . Diabetes mellitus   . S/P colonoscopy 2010    Dr. Servando Snare  . Hypotension   . Strep throat   . Diarrhea   . Weakness   . Bronchitis, acute, with bronchospasm   . Asthmatic bronchitis   .  Constipation, chronic   . Sleep apnea   . Facial pain   . Fibrocystic breast disease   . Neck pain   . Anxiety   . Osteopenia   . Hyperlipidemia   . Diverticulosis   . Stress-induced cardiomyopathy     a. echo 09/28/2014: EF 45-50%, severe HK of mid-distal anterior, apical and mid-distal inferior walls suggestive of stress induced CM, mildly dilated LA, mildly dilated PASP, mild TR, trivial pericardial effusion  . Hypertension     Surgical History: Past Surgical History  Procedure Laterality Date  . Abdominal hysterectomy      partial  . Nasal sinus surgery    . Cardiac catheterization  09/29/2014    [facility]    Home Medications:    Medication List       This list is accurate as of: 11/29/15 11:58 AM.  Always use your most recent med list.               ADVAIR DISKUS 250-50 MCG/DOSE Aepb  Generic drug:  Fluticasone-Salmeterol  INHALE ONE PUFF INTO THE LUNGS TWICE DAILY     albuterol (2.5 MG/3ML) 0.083% nebulizer solution  Commonly known as:  PROVENTIL  Take 3 mLs (2.5 mg total) by nebulization every 6 (six) hours as needed for wheezing  or shortness of breath.     albuterol 108 (90 Base) MCG/ACT inhaler  Commonly known as:  VENTOLIN HFA  INHALE TWO PUFFS INTO THE LUNGS EVERY FOUR HOURS AS NEEDED FOR WHEEZING     ALPRAZolam 0.5 MG tablet  Commonly known as:  XANAX  TAKE 1 TABLET BY MOUTH 3 TIMES A DAY AS NEEDED     meclizine 25 MG tablet  Commonly known as:  ANTIVERT  Take 1 tablet (25 mg total) by mouth as needed for dizziness.     metFORMIN 500 MG tablet  Commonly known as:  GLUCOPHAGE  TAKE 1 TABLET BY MOUTH TWICE A DAY WITH A MEAL     metoprolol succinate 25 MG 24 hr tablet  Commonly known as:  TOPROL-XL  Take 1 tablet (25 mg total) by mouth daily.     ONE TOUCH ULTRA TEST test strip  Generic drug:  glucose blood  TEST BLOOD SUGAR AT HOME TWICE A DAY     OXYGEN  Inhale 2 % into the lungs at bedtime.     rosuvastatin 5 MG tablet  Commonly known as:   CRESTOR  Take 1 tablet (5 mg total) by mouth daily.     sertraline 100 MG tablet  Commonly known as:  ZOLOFT  TAKE 1 TABLET (100 MG TOTAL) BY MOUTH 2 (TWO) TIMES DAILY.     SPIRIVA HANDIHALER 18 MCG inhalation capsule  Generic drug:  tiotropium  INHALE 1 CAP VIA INHALER DAILY     valsartan 160 MG tablet  Commonly known as:  DIOVAN  TAKE 1 TABLET BY MOUTH EVERY DAY     VESICARE 10 MG tablet  Generic drug:  solifenacin  TAKE 1 TABLET (10 MG TOTAL) BY MOUTH DAILY.     Vitamin D-3 1000 units Caps  Take 2 capsules by mouth.        Allergies:  Allergies  Allergen Reactions  . Amoxicillin-Pot Clavulanate     REACTION: stomach sensitive.  Lenice Llamas [Roflumilast]     Severe nausea   . Erythromycin Base     REACTION: GI upset  . Neurontin [Gabapentin]     anxiety    Family History: Family History  Problem Relation Age of Onset  . Diabetes Daughter   . Kidney disease Daughter   . Cancer Maternal Aunt     breast  . Diabetes Maternal Grandmother   . Cancer Cousin     colon  . Lung cancer Mother     smoked  . COPD Mother     smoked  . COPD Father     smoked  . Prostate cancer Father   . Kidney failure Father     Social History:  reports that she quit smoking about 3 years ago. Her smoking use included Cigarettes. She has a 10 pack-year smoking history. She has never used smokeless tobacco. She reports that she does not drink alcohol or use illicit drugs.  ROS: UROLOGY Frequent Urination?: Yes Hard to postpone urination?: Yes Burning/pain with urination?: No Get up at night to urinate?: Yes Leakage of urine?: Yes Urine stream starts and stops?: No Trouble starting stream?: Yes Do you have to strain to urinate?: No Blood in urine?: No Urinary tract infection?: No Sexually transmitted disease?: No Injury to kidneys or bladder?: No Painful intercourse?: No Weak stream?: No Currently pregnant?: No Vaginal bleeding?: No Last menstrual period?:  n  Gastrointestinal Nausea?: Yes Vomiting?: No Indigestion/heartburn?: No Diarrhea?: No Constipation?: No  Constitutional Fever: No Night sweats?:  No Weight loss?: No Fatigue?: Yes  Skin Skin rash/lesions?: No Itching?: No  Eyes Blurred vision?: No Double vision?: No  Ears/Nose/Throat Sore throat?: No Sinus problems?: No  Hematologic/Lymphatic Swollen glands?: No Easy bruising?: No  Cardiovascular Leg swelling?: No Chest pain?: No  Respiratory Cough?: Yes Shortness of breath?: Yes  Endocrine Excessive thirst?: Yes  Musculoskeletal Back pain?: No Joint pain?: Yes  Neurological Headaches?: Yes Dizziness?: Yes  Psychologic Depression?: Yes Anxiety?: Yes  Physical Exam: BP 143/114 mmHg  Pulse 80  Ht  (1.676 m)  Wt 143 lb 11.2 oz (65.182 kg)  BMI 23.20 kg/m2  Constitutional:  Alert and oriented, No acute distress. HEENT: [place] AT, moist mucus membranes.  Trachea midline, no masses. Cardiovascular: No clubbing, cyanosis, or edema. Respiratory: Normal respiratory effort, no increased work of breathing. GI: Abdomen is soft, nontender, nondistended, no abdominal masses GU: No CVA tenderness.  No suprapubic tenderness. Pelvic: Normal external genitalia. Atrophic mucosa.  Slightly patent urethral meatus with hypermobility with valsala but without demonstrable SUI.  Good vaginal support without signf  Skin: No rashes, bruises or suspicious lesions. Lymph: No cervical or inguinal adenopathy. Neurologic: Grossly intact, no focal deficits, moving all 4 extremities. Psychiatric: Normal mood and affect.  Laboratory Data: Lab Results  Component Value Date   WBC 6.1 09/30/2014   HGB 13.0 09/30/2014   HCT 39.0 09/30/2014   MCV 93 09/30/2014   PLT 234 09/30/2014    Lab Results  Component Value Date   CREATININE 0.85 10/17/2015    Lab Results  Component Value Date   HGBA1C 6.7* 10/17/2015    Urinalysis UA today with 6-10 WBC, otherwise  negative.    Pertinent Imaging: Results for orders placed or performed in visit on 11/29/15  BLADDER SCAN AMB NON-IMAGING  Result Value Ref Range   Scan Result     Assessment & Plan:    1. Mixed incontinence We discussed treatment option for both stress and urge incontinence and difference between the two problems.   For stress, I have recommended resuming Kegel exercises.  Discussed behavioral modification for urge symptoms. -Stop vesicare due to side effects and persistent severe symptoms -Mybetriq 25 mg x 2 weeks, samples given today.  Advised to call usand let us know if she'd like [place] to fill that script.   -RTC in 6 weeks for repeat PVR, recheck on symptoms  - Urinalysis, Complete - BLADDER SCAN AMB NON-IMAGING  2. OAB As above No evidence of UTI.  3. Incomplete bladder emptying No sequela including recurrent UTIs, renal dysfunction, or history of stones.  No cystocele on exam.  May be secondary to Vesicare.  4. HTN Will recheck BP next visit as Myrbetriq can increase BP.   Return in about 6 weeks (around 01/10/2016) for recheck OAB, PVR, BP.  Vanna Scotland, MD  [facility] [address], [address] [place], [place] [address] [phone]

## 2015-12-03 ENCOUNTER — Other Ambulatory Visit: Payer: Self-pay | Admitting: Internal Medicine

## 2015-12-03 ENCOUNTER — Other Ambulatory Visit: Payer: Self-pay

## 2015-12-03 MED ORDER — METOPROLOL SUCCINATE ER 25 MG PO TB24: 25.0000 mg | ORAL_TABLET | Freq: Every day | ORAL | Status: DC

## 2015-12-03 NOTE — Telephone Encounter (Signed)
Last OV in dec, last filled in nov... Okay to refill?

## 2015-12-03 NOTE — Telephone Encounter (Signed)
Refill sent for metoprolol.  

## 2015-12-11 ENCOUNTER — Other Ambulatory Visit: Payer: Self-pay

## 2015-12-11 DIAGNOSIS — N3281 Overactive bladder: Secondary | ICD-10-CM

## 2015-12-11 MED ORDER — MIRABEGRON ER 25 MG PO TB24: 25.0000 mg | ORAL_TABLET | Freq: Every day | ORAL | Status: DC

## 2015-12-11 NOTE — Progress Notes (Signed)
Pt called stating she thought the myrbetriq was really helping her symptoms. A script was sent to pt pharmacy.

## 2015-12-25 ENCOUNTER — Other Ambulatory Visit: Payer: Self-pay | Admitting: Internal Medicine

## 2016-01-04 ENCOUNTER — Telehealth: Payer: Self-pay | Admitting: Internal Medicine

## 2016-01-04 NOTE — Telephone Encounter (Signed)
Pt called needing documentation that she cannot do jury duty because she is unable to walk or stand for a long period of time.. Please advise pt..Marland Kitchen

## 2016-01-04 NOTE — Telephone Encounter (Signed)
Letter mailed to pt.  

## 2016-01-07 ENCOUNTER — Other Ambulatory Visit: Payer: Self-pay | Admitting: Internal Medicine

## 2016-01-11 ENCOUNTER — Encounter: Payer: Self-pay | Admitting: Internal Medicine

## 2016-01-15 ENCOUNTER — Encounter: Payer: Self-pay | Admitting: Internal Medicine

## 2016-01-15 ENCOUNTER — Telehealth: Payer: Self-pay | Admitting: Internal Medicine

## 2016-01-15 NOTE — Telephone Encounter (Signed)
Letter is completed, advised pt that she can pick up

## 2016-01-15 NOTE — Telephone Encounter (Signed)
Pt dropped off jury papers for Dr. Dan HumphreysWalker to complete. Papers are in Dr. Tilman NeatWalker's box.

## 2016-01-16 ENCOUNTER — Encounter: Payer: Self-pay | Admitting: *Deleted

## 2016-01-16 ENCOUNTER — Ambulatory Visit (INDEPENDENT_AMBULATORY_CARE_PROVIDER_SITE_OTHER): Payer: Medicare Other | Admitting: Urology

## 2016-01-16 ENCOUNTER — Encounter: Payer: Self-pay | Admitting: Urology

## 2016-01-16 VITALS — BP 123/77 | HR 83 | Ht 65.0 in | Wt 142.0 lb

## 2016-01-16 DIAGNOSIS — Z7689 Persons encountering health services in other specified circumstances: Secondary | ICD-10-CM

## 2016-01-16 DIAGNOSIS — R339 Retention of urine, unspecified: Secondary | ICD-10-CM

## 2016-01-16 DIAGNOSIS — I10 Essential (primary) hypertension: Secondary | ICD-10-CM | POA: Diagnosis not present

## 2016-01-16 DIAGNOSIS — R32 Unspecified urinary incontinence: Secondary | ICD-10-CM

## 2016-01-16 DIAGNOSIS — N3946 Mixed incontinence: Secondary | ICD-10-CM

## 2016-01-16 LAB — BLADDER SCAN AMB NON-IMAGING

## 2016-01-16 MED ORDER — MIRABEGRON ER 50 MG PO TB24: 50.0000 mg | ORAL_TABLET | Freq: Every day | ORAL | Status: DC

## 2016-01-20 ENCOUNTER — Encounter: Payer: Self-pay | Admitting: Urology

## 2016-01-20 NOTE — Progress Notes (Signed)
3:39 PM  01/16/16  Tami Skinner 09-Nov-1945 [id number]  Referring provider: Shelia Media, MD [address] [place], [place] [address]  Chief Complaint  Patient presents with  . Urinary Incontinence    6wk w/PVR    HPI: 70 yo M with urinary urgency, frequency, urgency continence, and mild stress urinary incontinence.  She was given Vesicare 10 mg which does help some.  She did also try another medication prior this (samples given but she cannot recal the name of the medication).  Both of these medications caused severe dry mouth.     At last visit, she was given Mybetriq 25 mg which have moderately improved her symptoms. She notes less urinary frequency, incontinence, and nocturia.  Her blood pressure is in fact improved today.  He does not had any other notable side effects from the indication.  She is a diabetic, type 2 since 2001, keeps blood sugar under good control.       PMH: Past Medical History  Diagnosis Date  . COPD (chronic obstructive pulmonary disease) (HCC)   . Diverticulitis   . Diabetes mellitus   . S/P colonoscopy 2010    Dr. Servando Snare  . Hypotension   . Strep throat   . Diarrhea   . Weakness   . Bronchitis, acute, with bronchospasm   . Asthmatic bronchitis   . Constipation, chronic   . Sleep apnea   . Facial pain   . Fibrocystic breast disease   . Neck pain   . Anxiety   . Osteopenia   . Hyperlipidemia   . Diverticulosis   . Stress-induced cardiomyopathy     a. echo 09/28/2014: EF 45-50%, severe HK of mid-distal anterior, apical and mid-distal inferior walls suggestive of stress induced CM, mildly dilated LA, mildly dilated PASP, mild TR, trivial pericardial effusion  . Hypertension     Surgical History: Past Surgical History  Procedure Laterality Date  . Abdominal hysterectomy      partial  . Nasal sinus surgery    . Cardiac catheterization  09/29/2014    [facility]    Home Medications:    Medication List       This  list is accurate as of: 01/16/16 11:59 PM.  Always use your most recent med list.               ADVAIR DISKUS 250-50 MCG/DOSE Aepb  Generic drug:  Fluticasone-Salmeterol  INHALE ONE PUFF INTO THE LUNGS TWICE DAILY     albuterol (2.5 MG/3ML) 0.083% nebulizer solution  Commonly known as:  PROVENTIL  Take 3 mLs (2.5 mg total) by nebulization every 6 (six) hours as needed for wheezing or shortness of breath.     albuterol 108 (90 Base) MCG/ACT inhaler  Commonly known as:  VENTOLIN HFA  INHALE TWO PUFFS INTO THE LUNGS EVERY FOUR HOURS AS NEEDED FOR WHEEZING     ALPRAZolam 0.5 MG tablet  Commonly known as:  XANAX  TAKE 1 TABLET BY MOUTH 3 TIMES DAILY AS NEEDED     meclizine 25 MG tablet  Commonly known as:  ANTIVERT  Take 1 tablet (25 mg total) by mouth as needed for dizziness.     metFORMIN 500 MG tablet  Commonly known as:  GLUCOPHAGE  TAKE 1 TABLET BY MOUTH TWICE A DAY WITH A MEAL     metoprolol succinate 25 MG 24 hr tablet  Commonly known as:  TOPROL-XL  Take 1 tablet (25 mg total) by mouth daily.  mirabegron ER 50 MG Tb24 tablet  Commonly known as:  MYRBETRIQ  Take 1 tablet (50 mg total) by mouth daily.     nortriptyline 10 MG capsule  Commonly known as:  PAMELOR     ONE TOUCH ULTRA TEST test strip  Generic drug:  glucose blood  TEST BLOOD SUGAR AT HOME TWICE A DAY     OXYGEN  Inhale 2 % into the lungs at bedtime.     rosuvastatin 5 MG tablet  Commonly known as:  CRESTOR  Take 1 tablet (5 mg total) by mouth daily.     sertraline 100 MG tablet  Commonly known as:  ZOLOFT  TAKE 1 TABLET (100 MG TOTAL) BY MOUTH 2 (TWO) TIMES DAILY.     SPIRIVA HANDIHALER 18 MCG inhalation capsule  Generic drug:  tiotropium  INHALE 1 CAP VIA INHALER DAILY     valsartan 160 MG tablet  Commonly known as:  DIOVAN  TAKE 1 TABLET BY MOUTH EVERY DAY     Vitamin D-3 1000 units Caps  Take 2 capsules by mouth.        Allergies:  Allergies  Allergen Reactions  .  Amoxicillin-Pot Clavulanate     REACTION: stomach sensitive.  Lenice Llamas. Daliresp [Roflumilast]     Severe nausea   . Erythromycin Base     REACTION: GI upset  . Neurontin [Gabapentin]     anxiety    Family History: Family History  Problem Relation Age of Onset  . Diabetes Daughter   . Kidney disease Daughter   . Cancer Maternal Aunt     breast  . Diabetes Maternal Grandmother   . Cancer Cousin     colon  . Lung cancer Mother     smoked  . COPD Mother     smoked  . COPD Father     smoked  . Prostate cancer Father   . Kidney failure Father     Social History:  reports that she quit smoking about 3 years ago. Her smoking use included Cigarettes. She has a 10 pack-year smoking history. She has never used smokeless tobacco. She reports that she does not drink alcohol or use illicit drugs.  ROS: UROLOGY Frequent Urination?: Yes Hard to postpone urination?: Yes Burning/pain with urination?: No Get up at night to urinate?: No Leakage of urine?: Yes Urine stream starts and stops?: Yes Trouble starting stream?: Yes Do you have to strain to urinate?: No Blood in urine?: No Urinary tract infection?: No Sexually transmitted disease?: No Injury to kidneys or bladder?: No Painful intercourse?: No Weak stream?: No Currently pregnant?: No Vaginal bleeding?: No Last menstrual period?: n  Gastrointestinal Nausea?: No Vomiting?: No Indigestion/heartburn?: No Diarrhea?: No Constipation?: No  Constitutional Fever: No Night sweats?: No Weight loss?: No Fatigue?: No  Skin Skin rash/lesions?: No Itching?: No  Eyes Blurred vision?: No Double vision?: No  Ears/Nose/Throat Sore throat?: No Sinus problems?: Yes  Hematologic/Lymphatic Swollen glands?: No Easy bruising?: No  Cardiovascular Leg swelling?: No Chest pain?: No  Respiratory Cough?: Yes Shortness of breath?: Yes  Endocrine Excessive thirst?: Yes  Musculoskeletal Back pain?: No Joint pain?:  No  Neurological Headaches?: No Dizziness?: Yes  Psychologic Depression?: Yes Anxiety?: Yes  Physical Exam: BP 123/77 mmHg  Pulse 83  Ht 5\' 5"  (1.651 m)  Wt 142 lb (64.411 kg)  BMI 23.63 kg/m2  Constitutional:  Alert and oriented, No acute distress. HEENT: [place] AT, moist mucus membranes.  Trachea midline, no masses. Cardiovascular: No clubbing, cyanosis, or  edema. Respiratory: Normal respiratory effort, no increased work of breathing. GI: Abdomen is soft, nontender, nondistended, no abdominal masses GU: No CVA tenderness.  No suprapubic tenderness. Skin: No rashes, bruises or suspicious lesions. Neurologic: Grossly intact, no focal deficits, moving all 4 extremities. Psychiatric: Normal mood and affect.  Laboratory Data: Lab Results  Component Value Date   WBC 6.1 09/30/2014   HGB 13.0 09/30/2014   HCT 39.0 09/30/2014   MCV 93 09/30/2014   PLT 234 09/30/2014    Lab Results  Component Value Date   CREATININE 0.85 10/17/2015    Lab Results  Component Value Date   HGBA1C 6.7* 10/17/2015    Pertinent Imaging: Results for orders placed or performed in visit on 01/16/16  BLADDER SCAN AMB NON-IMAGING  Result Value Ref Range   Scan Result     Assessment & Plan:    1. Mixed incontinence  Continue weight loss and Cagle's for stress urinary incontinence. Plan to increase Mybetriq 50 mg to optimize medication as she is able to tolerate it well and has helped some with her symptoms. -RTC in 6 weeks for repeat PVR, recheck on symptoms  - Urinalysis, Complete - BLADDER SCAN AMB NON-IMAGING  2. OAB As above  3. Incomplete bladder emptying No sequela including recurrent UTIs, renal dysfunction, or history of stones.  No cystocele on exam.  PVR improved slightly today to 119.  4. HTN Continue to monitor BP with Mybetriq.  Return in about 6 weeks (around 02/27/2016).  Vanna Scotland, MD  [facility] [address], [address] [place], [place] [address] [phone]

## 2016-01-22 ENCOUNTER — Ambulatory Visit: Payer: Self-pay | Admitting: Cardiovascular Disease

## 2016-01-22 ENCOUNTER — Telehealth: Payer: Self-pay | Admitting: Pulmonary Disease

## 2016-01-22 NOTE — Telephone Encounter (Signed)
Spoke with pt, requesting an order for a smaller poc.  I advised pt it's been over 2 years since she's been seen by BQ, would have to schedule an ov to order this.  Pt scheduled for next available.  Nothing further needed.

## 2016-01-28 ENCOUNTER — Telehealth: Payer: Self-pay

## 2016-01-28 NOTE — Telephone Encounter (Signed)
Pt called and wanted to make you aware that she liked the myrbetriq and had medication filled. Pt also stated that she is still wanting to do PTNS but is waiting to see if her insurance will cover it.

## 2016-01-30 ENCOUNTER — Ambulatory Visit: Payer: Self-pay | Admitting: Pulmonary Disease

## 2016-02-24 ENCOUNTER — Other Ambulatory Visit: Payer: Self-pay | Admitting: Internal Medicine

## 2016-02-25 NOTE — Telephone Encounter (Signed)
Xanax refill request.  Last filled 12/03/15.  Last seen 10/17/2015.

## 2016-02-27 ENCOUNTER — Ambulatory Visit: Payer: Medicare Other

## 2016-02-27 ENCOUNTER — Ambulatory Visit: Payer: Medicare Other | Admitting: Urology

## 2016-02-28 ENCOUNTER — Ambulatory Visit (INDEPENDENT_AMBULATORY_CARE_PROVIDER_SITE_OTHER): Payer: Medicare Other | Admitting: Cardiovascular Disease

## 2016-02-28 ENCOUNTER — Encounter: Payer: Self-pay | Admitting: Cardiovascular Disease

## 2016-02-28 VITALS — BP 122/84 | HR 69 | Ht 66.0 in | Wt 144.0 lb

## 2016-02-28 DIAGNOSIS — I10 Essential (primary) hypertension: Secondary | ICD-10-CM | POA: Diagnosis not present

## 2016-02-28 NOTE — Patient Instructions (Signed)
Medication Instructions: Continue same medications.   Labwork: None.   Procedures/Testing: None.   Follow-Up: 1 year with Dr. Arida.   Any Additional Special Instructions Will Be Listed Below (If Applicable).     If you need a refill on your cardiac medications before your next appointment, please call your pharmacy.   

## 2016-02-28 NOTE — Progress Notes (Signed)
Cardiology Office Note   Date:  02/28/2016   ID:  Tami Skinner, DOB 11/14/1945, MRN [id number]  PCP:  Tami Skinner,Tami Skinner, Tami Skinner  Cardiologist:   Tami Skinner, Tami Skinner   Chief Complaint  Patient presents with  . other    6 month f/u. Meds reviewed verbally with pt.      History of Present Illness: Tami Skinner is a 70 y.o. female who presents for a follow-up visit for stress-induced cardiomyopathy. She was hospitalized in November, 2015 with chest pain and shortness of breath with mildly elevated cardiac enzymes. Cardiac catheterization showed no evidence of obstructive coronary artery disease. Ejection fraction was 40-45% with wall motion abnormality suggestive of stress-induced cardiomyopathy. She has been under significant stress due to the fact that her husband is an alcoholic.  Echocardiogram in October 2016 showed normal LV systolic function and only mild pulmonary hypertension. She is overall doing better than before with no chest pain and stable exertional dyspnea. Her stress has improved since her husband stopped drinking alcohol after recent surgery.    Past Medical History  Diagnosis Date  . COPD (chronic obstructive pulmonary disease) (HCC)   . Diverticulitis   . Diabetes mellitus   . S/P colonoscopy 2010    Dr. Servando SnareWohl  . Hypotension   . Strep throat   . Diarrhea   . Weakness   . Bronchitis, acute, with bronchospasm   . Asthmatic bronchitis   . Constipation, chronic   . Sleep apnea   . Facial pain   . Fibrocystic breast disease   . Neck pain   . Anxiety   . Osteopenia   . Hyperlipidemia   . Diverticulosis   . Stress-induced cardiomyopathy     a. echo 09/28/2014: EF 45-50%, severe HK of mid-distal anterior, apical and mid-distal inferior walls suggestive of stress induced CM, mildly dilated LA, mildly dilated PASP, mild TR, trivial pericardial effusion  . Hypertension     Past Surgical History  Procedure Laterality Date  . Abdominal hysterectomy     partial  . Nasal sinus surgery    . Cardiac catheterization  09/29/2014    [facility]     Current Outpatient Prescriptions  Medication Sig Dispense Refill  . ADVAIR DISKUS 250-50 MCG/DOSE AEPB INHALE ONE PUFF INTO THE LUNGS TWICE DAILY 60 each 5  . albuterol (PROVENTIL) (2.5 MG/3ML) 0.083% nebulizer solution Take 3 mLs (2.5 mg total) by nebulization every 6 (six) hours as needed for wheezing or shortness of breath. 75 mL 12  . albuterol (VENTOLIN HFA) 108 (90 BASE) MCG/ACT inhaler INHALE TWO PUFFS INTO THE LUNGS EVERY FOUR HOURS AS NEEDED FOR WHEEZING 18 Inhaler 11  . ALPRAZolam (XANAX) 0.5 MG tablet TAKE 1 TABLET BY MOUTH 3 TIMES DAILY AS NEEDED 90 tablet 1  . Cholecalciferol (VITAMIN D-3) 1000 UNITS CAPS Take 2 capsules by mouth.     . meclizine (ANTIVERT) 25 MG tablet Take 1 tablet (25 mg total) by mouth as needed for dizziness. 30 tablet 11  . metFORMIN (GLUCOPHAGE) 500 MG tablet TAKE 1 TABLET BY MOUTH TWICE A DAY WITH A MEAL 60 tablet 6  . metoprolol succinate (TOPROL-XL) 25 MG 24 hr tablet Take 1 tablet (25 mg total) by mouth daily. 30 tablet 6  . mirabegron ER (MYRBETRIQ) 50 MG TB24 tablet Take 1 tablet (50 mg total) by mouth daily. 30 tablet 3  . ONE TOUCH ULTRA TEST test strip TEST BLOOD SUGAR AT HOME TWICE A DAY 100 each 5  . OXYGEN-HELIUM  IN Inhale 2 % into the lungs at bedtime.      . rosuvastatin (CRESTOR) 5 MG tablet Take 1 tablet (5 mg total) by mouth daily. 90 tablet 3  . sertraline (ZOLOFT) 100 MG tablet TAKE 1 TABLET (100 MG TOTAL) BY MOUTH 2 (TWO) TIMES DAILY. 60 tablet 5  . SPIRIVA HANDIHALER 18 MCG inhalation capsule INHALE 1 CAP VIA INHALER DAILY 90 capsule 3  . valsartan (DIOVAN) 160 MG tablet TAKE 1 TABLET BY MOUTH EVERY DAY 30 tablet 5   No current facility-administered medications for this visit.    Allergies:   Amoxicillin-pot clavulanate; Daliresp; Erythromycin base; and Neurontin    Social History:  The patient  reports that she quit smoking about 3 years ago.  Her smoking use included Cigarettes. She has a 10 pack-year smoking history. She has never used smokeless tobacco. She reports that she does not drink alcohol or use illicit drugs.   Family History:  family history includes COPD in her father and mother; Cancer in her cousin and maternal aunt; Diabetes in her daughter and maternal grandmother; Kidney disease in her daughter; Kidney failure in her father; Lung cancer in her mother; Prostate cancer in her father.    ROS:  Please see the history of present illness.   Otherwise, review of systems are positive for none.   All other systems are reviewed and negative.    PHYSICAL EXAM: VS:  BP 122/84 mmHg  Pulse 69  Ht  (1.676 m)  Wt 144 lb (65.318 kg)  BMI 23.25 kg/m2 , BMI Body mass index is 23.25 kg/(m^2). GEN: Well nourished, well developed, in no acute distress HEENT: normal Neck: no JVD, carotid bruits, or masses Cardiac: RRR; no murmurs, rubs, or gallops,no edema  Respiratory:  clear to auscultation bilaterally, normal work of breathing GI: soft, nontender, nondistended, + BS MS: no deformity or atrophy Skin: warm and dry, no rash Neuro:  Strength and sensation are intact Psych: euthymic mood, full affect   EKG:  EKG is ordered today. The ekg ordered today demonstrates normal sinus rhythm with left axis deviation.   Recent Labs: 10/17/2015: ALT 13; BUN 13; Creatinine, Ser 0.85; Potassium 4.8; Sodium 138    Lipid Panel    Component Value Date/Time   CHOL 150 10/17/2015 1138   CHOL 110 09/28/2014 0637   TRIG 83.0 10/17/2015 1138   TRIG 102 09/28/2014 0637   HDL 66.60 10/17/2015 1138   HDL 54 09/28/2014 0637   CHOLHDL 2 10/17/2015 1138   VLDL 16.6 10/17/2015 1138   VLDL 20 09/28/2014 0637   LDLCALC 66 10/17/2015 1138   LDLCALC 36 09/28/2014 0637   LDLDIRECT 144.4 10/11/2013 1102      Wt Readings from Last 3 Encounters:  02/28/16 144 lb (65.318 kg)  01/16/16 142 lb (64.411 kg)  11/29/15 143 lb 11.2 oz (65.182  kg)         ASSESSMENT AND PLAN:  1.  History of stress-induced cardiomyopathy: Echocardiogram in 2016 showed normalization of LV systolic function. She is overall doing very well with no symptoms of heart failure. Continue treatment with Toprol and Diovan.  2. Essential hypertension: Blood pressure is well controlled on current medications.  3. Hyperlipidemia: Most recent LDL was 66. She is on small dose rosuvastatin.    Disposition:   FU with me in 1 year  Signed,  Tami Bears, Tami Skinner  02/28/2016 4:59 PM    [facility]

## 2016-02-29 ENCOUNTER — Ambulatory Visit: Payer: Self-pay | Admitting: Pulmonary Disease

## 2016-03-04 ENCOUNTER — Ambulatory Visit: Payer: Medicare Other

## 2016-03-11 ENCOUNTER — Ambulatory Visit (INDEPENDENT_AMBULATORY_CARE_PROVIDER_SITE_OTHER): Payer: Medicare Other

## 2016-03-11 VITALS — BP 121/78 | HR 85

## 2016-03-11 DIAGNOSIS — N3281 Overactive bladder: Secondary | ICD-10-CM | POA: Diagnosis not present

## 2016-03-11 LAB — BLADDER SCAN AMB NON-IMAGING: Scan Result: 144

## 2016-03-11 NOTE — Progress Notes (Signed)
Per Dr. Apolinar JunesBrandon pt came in today for BP and PVR.

## 2016-03-14 LAB — HM DIABETES EYE EXAM

## 2016-03-22 ENCOUNTER — Other Ambulatory Visit: Payer: Self-pay | Admitting: Internal Medicine

## 2016-03-27 ENCOUNTER — Other Ambulatory Visit: Payer: Self-pay | Admitting: Internal Medicine

## 2016-04-26 ENCOUNTER — Other Ambulatory Visit: Payer: Self-pay | Admitting: Internal Medicine

## 2016-05-12 ENCOUNTER — Other Ambulatory Visit: Payer: Self-pay | Admitting: Internal Medicine

## 2016-05-12 DIAGNOSIS — Z1231 Encounter for screening mammogram for malignant neoplasm of breast: Secondary | ICD-10-CM

## 2016-05-20 ENCOUNTER — Encounter: Payer: Self-pay | Admitting: Internal Medicine

## 2016-05-20 ENCOUNTER — Ambulatory Visit (INDEPENDENT_AMBULATORY_CARE_PROVIDER_SITE_OTHER): Payer: Medicare Other | Admitting: Internal Medicine

## 2016-05-20 ENCOUNTER — Ambulatory Visit: Payer: Self-pay

## 2016-05-20 VITALS — BP 90/58 | HR 75 | Ht 66.0 in | Wt 143.6 lb

## 2016-05-20 DIAGNOSIS — I10 Essential (primary) hypertension: Secondary | ICD-10-CM

## 2016-05-20 DIAGNOSIS — H8110 Benign paroxysmal vertigo, unspecified ear: Secondary | ICD-10-CM

## 2016-05-20 DIAGNOSIS — E119 Type 2 diabetes mellitus without complications: Secondary | ICD-10-CM | POA: Diagnosis not present

## 2016-05-20 DIAGNOSIS — Z0000 Encounter for general adult medical examination without abnormal findings: Secondary | ICD-10-CM

## 2016-05-20 LAB — COMPREHENSIVE METABOLIC PANEL
ALBUMIN: 4.3 g/dL (ref 3.5–5.2)
ALT: 17 U/L (ref 0–35)
AST: 18 U/L (ref 0–37)
Alkaline Phosphatase: 49 U/L (ref 39–117)
BUN: 16 mg/dL (ref 6–23)
CHLORIDE: 102 meq/L (ref 96–112)
CO2: 31 meq/L (ref 19–32)
CREATININE: 0.75 mg/dL (ref 0.40–1.20)
Calcium: 10.1 mg/dL (ref 8.4–10.5)
GFR: 81.03 mL/min (ref >60.00)
Glucose, Bld: 129 mg/dL — ABNORMAL HIGH (ref 70–99)
POTASSIUM: 4.3 meq/L (ref 3.5–5.1)
SODIUM: 138 meq/L (ref 135–145)
Total Bilirubin: 0.3 mg/dL (ref 0.2–1.2)
Total Protein: 6.6 g/dL (ref 6.0–8.3)

## 2016-05-20 LAB — VITAMIN B12: Vitamin B-12: 239 pg/mL (ref 211–911)

## 2016-05-20 LAB — HEMOGLOBIN A1C: HEMOGLOBIN A1C: 6.2 % (ref 4.6–6.5)

## 2016-05-20 MED ORDER — METOPROLOL SUCCINATE ER 25 MG PO TB24: 25.0000 mg | ORAL_TABLET | Freq: Every day | ORAL | Status: DC

## 2016-05-20 MED ORDER — VALSARTAN 160 MG PO TABS: 160.0000 mg | ORAL_TABLET | Freq: Every day | ORAL | Status: DC

## 2016-05-20 MED ORDER — SERTRALINE HCL 100 MG PO TABS: ORAL_TABLET | ORAL | Status: DC

## 2016-05-20 MED ORDER — METFORMIN HCL 500 MG PO TABS: ORAL_TABLET | ORAL | Status: DC

## 2016-05-20 NOTE — Assessment & Plan Note (Signed)
BP Readings from Last 3 Encounters:  05/20/16 90/58  03/11/16 121/78  02/28/16 122/84   BP on low side. She reports she was recently seen by cardiology and no medication changes made. Will monitor. May need to decrease dose of Valsartan and/or metoprolol if persistent hypotension.

## 2016-05-20 NOTE — Assessment & Plan Note (Signed)
Encouraged follow up with ENT for Epley maneuver as needed.

## 2016-05-20 NOTE — Assessment & Plan Note (Signed)
Will check A1c with labs. Continue Metformin. 

## 2016-05-20 NOTE — Progress Notes (Signed)
Subjective:   Tami Skinner is a 70 y.o. female who presents for Medicare Annual (Subsequent) preventive examination.  Review of Systems:  No ROS.  Medicare Wellness Visit.  Cardiac Risk Factors include: advanced age (>54men, >59 women);hypertension;diabetes mellitus     Objective:     Vitals: BP 90/58 mmHg  Pulse 75  Ht 5\' 6"  (1.676 m)  Wt 143 lb 9.6 oz (65.137 kg)  BMI 23.19 kg/m2  SpO2 93%  Body mass index is 23.19 kg/(m^2).   Tobacco History  Smoking status  . Former Smoker -- 0.25 packs/day for 40 years  . Types: Cigarettes  . Quit date: 11/24/2012  Smokeless tobacco  . Never Used    Comment: Uses nicotrol inhaler on occasion     Counseling given: Not Answered   Past Medical History  Diagnosis Date  . COPD (chronic obstructive pulmonary disease) (HCC)   . Diverticulitis   . Diabetes mellitus   . S/P colonoscopy 2010    Tami Skinner  . Hypotension   . Strep throat   . Diarrhea   . Weakness   . Bronchitis, acute, with bronchospasm   . Asthmatic bronchitis   . Constipation, chronic   . Sleep apnea   . Facial pain   . Fibrocystic breast disease   . Neck pain   . Anxiety   . Osteopenia   . Hyperlipidemia   . Diverticulosis   . Stress-induced cardiomyopathy     a. echo 09/28/2014: EF 45-50%, severe HK of mid-distal anterior, apical and mid-distal inferior walls suggestive of stress induced CM, mildly dilated LA, mildly dilated PASP, mild TR, trivial pericardial effusion  . Hypertension    Past Surgical History  Procedure Laterality Date  . Abdominal hysterectomy      partial  . Nasal sinus surgery    . Cardiac catheterization  09/29/2014    [facility]   Family History  Problem Relation Age of Onset  . Diabetes Daughter   . Kidney disease Daughter   . Cancer Maternal Aunt     breast  . Diabetes Maternal Grandmother   . Cancer Cousin     colon  . Lung cancer Mother     smoked  . COPD Mother     smoked  . COPD Father     smoked  . Prostate  cancer Father   . Kidney failure Father    History  Sexual Activity  . Sexual Activity: Not Currently    Outpatient Encounter Prescriptions as of 05/20/2016  Medication Sig  . ADVAIR DISKUS 250-50 MCG/DOSE AEPB INHALE ONE PUFF INTO THE LUNGS TWICE DAILY  . albuterol (PROVENTIL) (2.5 MG/3ML) 0.083% nebulizer solution Take 3 mLs (2.5 mg total) by nebulization every 6 (six) hours as needed for wheezing or shortness of breath.  Marland Kitchen albuterol (VENTOLIN HFA) 108 (90 BASE) MCG/ACT inhaler INHALE TWO PUFFS INTO THE LUNGS EVERY FOUR HOURS AS NEEDED FOR WHEEZING  . ALPRAZolam (XANAX) 0.5 MG tablet TAKE 1 TABLET BY MOUTH 3 TIMES DAILY AS NEEDED  . Cholecalciferol (VITAMIN D-3) 1000 UNITS CAPS Take 2 capsules by mouth.   . meclizine (ANTIVERT) 25 MG tablet Take 1 tablet (25 mg total) by mouth as needed for dizziness.  . metFORMIN (GLUCOPHAGE) 500 MG tablet TAKE 1 TABLET BY MOUTH TWICE A DAY WITH A MEAL  . metoprolol succinate (TOPROL-XL) 25 MG 24 hr tablet Take 1 tablet (25 mg total) by mouth daily.  . ONE TOUCH ULTRA TEST test strip TEST BLOOD SUGAR AT HOME  TWICE A DAY  . OXYGEN-HELIUM IN Inhale 2 % into the lungs at bedtime.    . rosuvastatin (CRESTOR) 5 MG tablet Take 1 tablet (5 mg total) by mouth daily.  . sertraline (ZOLOFT) 100 MG tablet TAKE 1 TABLET (100 MG TOTAL) BY MOUTH 2 (TWO) TIMES DAILY.  Marland Kitchen. SPIRIVA HANDIHALER 18 MCG inhalation capsule INHALE 1 CAP VIA INHALER DAILY  . valsartan (DIOVAN) 160 MG tablet Take 1 tablet (160 mg total) by mouth daily.  . [DISCONTINUED] metFORMIN (GLUCOPHAGE) 500 MG tablet TAKE 1 TABLET BY MOUTH TWICE A DAY WITH A MEAL  . [DISCONTINUED] metoprolol succinate (TOPROL-XL) 25 MG 24 hr tablet Take 1 tablet (25 mg total) by mouth daily.  . [DISCONTINUED] sertraline (ZOLOFT) 100 MG tablet TAKE 1 TABLET (100 MG TOTAL) BY MOUTH 2 (TWO) TIMES DAILY.  . [DISCONTINUED] valsartan (DIOVAN) 160 MG tablet TAKE 1 TABLET BY MOUTH EVERY DAY  . [DISCONTINUED] mirabegron ER  (MYRBETRIQ) 50 MG TB24 tablet Take 1 tablet (50 mg total) by mouth daily.  . [DISCONTINUED] sertraline (ZOLOFT) 100 MG tablet TAKE 1 TABLET (100 MG TOTAL) BY MOUTH 2 (TWO) TIMES DAILY.   No facility-administered encounter medications on file as of 05/20/2016.    Activities of Daily Living In your present state of health, do you have any difficulty performing the following activities: 05/20/2016  Hearing? N  Vision? N  Difficulty concentrating or making decisions? N  Walking or climbing stairs? Y  Dressing or bathing? N  Doing errands, shopping? N  Preparing Food and eating ? N  Using the Toilet? N  In the past six months, have you accidently leaked urine? Y  Do you have problems with loss of bowel control? N  Managing your Medications? N  Managing your Finances? N  Housekeeping or managing your Housekeeping? N    Patient Care Team: Tami MediaJennifer A Walker, MD as PCP - General (Internal Medicine)    Assessment:    This is a routine wellness examination [facility]. The goal of the wellness visit is to assist the patient how to close the gaps in care and create a preventative care plan for the patient.   Taking calcium VIT D3 as appropriate/Osteoporosis risk reviewed.  Medications reviewed; taking without issues or barriers.  Safety issues reviewed; smoke and carbon monoxide detectors in the home. Firearms locked in a safe within the home. Wears seatbelts when driving or riding with others. No violence in the home.  No identified risk were noted; The patient was oriented x 3; appropriate in dress and manner and no objective failures at ADL's or IADL's.   Body mass index within normal range.   Type 2 diabetes mellitus-stable and followed by PCP. Chronic obstructive pulmonary disease, unspecified-stable and followed by Tami Skinner and PCP. AMI NOS, unspecified-stable and followed by Tami Skinner  Patient Concerns: None at this time. Follow up with PCP as needed.  Exercise  Activities and Dietary recommendations Current Exercise Habits: Home exercise routine, Type of exercise: walking, Time (Minutes): 30, Frequency (Times/Week): 3, Weekly Exercise (Minutes/Week): 90, Intensity: Mild  Goals    . Healthy Lifestyle     Stay hydrated and drink plenty of fluids. Low carb foods.  Lean meats (chicken, Malaw[place]key, fish), vegetables. Stay active and continue walking as tolerated.  Start [employer] program at [employer] when possible.      Fall Risk Fall Risk  05/20/2016 05/20/2016 12/07/2014 10/11/2013  Falls in the past year? Yes No No Yes  Number falls in past  yr: 2 or more - - 2 or more  Injury with Fall? No - - -  Risk Factor Category  High Fall Risk - - -  Follow up Education provided - - -   Depression Screen PHQ 2/9 Scores 05/20/2016 05/20/2016 12/07/2014 10/11/2013  PHQ - 2 Score 0 0 0 0     Cognitive Testing MMSE - Mini Mental State Exam 05/20/2016  Orientation to time 5  Orientation to Place 5  Registration 3  Attention/ Calculation 5  Recall 3  Language- name 2 objects 2  Language- repeat 1  Language- follow 3 step command 3  Language- read & follow direction 1  Write a sentence 1  Copy design 1  Total score 30    Immunization History  Administered Date(s) Administered  . Influenza Split 08/28/2011, 09/07/2012  . Influenza, High Dose Seasonal PF 07/25/2015  . Influenza,inj,Quad PF,36+ Mos 08/02/2013, 08/09/2014  . Pneumococcal Conjugate-13 05/19/2014  . Pneumococcal Polysaccharide-23 09/07/2012  . Td 02/16/2008  . Zoster 02/25/2010   Screening Tests Health Maintenance  Topic Date Due  . FOOT EXAM  05/20/2015  . INFLUENZA VACCINE  06/03/2016  . HEMOGLOBIN A1C  11/20/2016  . MAMMOGRAM  11/30/2016  . OPHTHALMOLOGY EXAM  03/14/2017  . TETANUS/TDAP  02/15/2018  . COLONOSCOPY  02/24/2019  . DEXA SCAN  Completed  . ZOSTAVAX  Completed  . Hepatitis C Screening  Completed      Plan:   End of life planning; Advance aging; Advanced  directives discussed. Copy of current HCPOA/Living Will requested.  During the course of the visit the patient was educated and counseled about the following appropriate screening and preventive services:   Vaccines to include Pneumoccal, Influenza, Hepatitis B, Td, Zostavax, HCV  Electrocardiogram  Cardiovascular Disease  Colorectal cancer screening  Bone density screening  Diabetes screening  Glaucoma screening  Mammography/PAP  Nutrition counseling   Patient Instructions (the written plan) was given to the patient.   Ashok Pall, LPN  1/61/0960

## 2016-05-20 NOTE — Patient Instructions (Addendum)
  Ms. Ardelle AntonWagoner , Thank you for taking time to come for your Medicare Wellness Visit. I appreciate your ongoing commitment to your health goals. Please review the following plan we discussed and let me know if I can assist you in the future.     This is a list of the screening recommended for you and due dates:  Health Maintenance  Topic Date Due  . Complete foot exam   05/20/2015  . Flu Shot  06/03/2016  . Hemoglobin A1C  11/20/2016  . Mammogram  11/30/2016  . Eye exam for diabetics  03/14/2017  . Tetanus Vaccine  02/15/2018  . Colon Cancer Screening  02/24/2019  . DEXA scan (bone density measurement)  Completed  . Shingles Vaccine  Completed  .  Hepatitis C: One time screening is recommended by Center for Disease Control  (CDC) for  adults born from [birth year] through 1965.   Completed       Labs today.  Follow up in 3 months.

## 2016-05-20 NOTE — Progress Notes (Signed)
Annual Wellness Visit as completed by Health Coach was reviewed in full.  

## 2016-05-20 NOTE — Addendum Note (Signed)
Addended by: Varney Biles'BRIEN-BLANEY, Haddie Bruhl L on: 05/20/2016 03:54 PM   Modules accepted: Level of Service, SmartSet

## 2016-05-20 NOTE — Progress Notes (Signed)
Subjective:    Patient ID: Tami Skinner, female    DOB: 02-18-1946, 70 y.o.   MRN: [id number]  HPI  69YO female presents for follow up.  BPPV - Having frequent vertigo with nausea. Tried Meclizine with minimal improvement. This makes her drowsy. Seen by ENT on several occasional with Epley maneuver.  HTN - Compliant with medications. Feeling more fatigued at home. Taking a nap daily. Goes to bed 12am however. No CP or dyspnea recently, outside of baseline. Seen by cardiology recently. No changes made.  DM - BG 89-94 mostly. Compliant with medication.  Lab Results  Component Value Date   HGBA1C 6.7* 10/17/2015     Wt Readings from Last 3 Encounters:  05/20/16 143 lb 9.6 oz (65.137 kg)  02/28/16 144 lb (65.318 kg)  01/16/16 142 lb (64.411 kg)   BP Readings from Last 3 Encounters:  05/20/16 90/58  03/11/16 121/78  02/28/16 122/84    Past Medical History  Diagnosis Date  . COPD (chronic obstructive pulmonary disease) (HCC)   . Diverticulitis   . Diabetes mellitus   . S/P colonoscopy 2010    Dr. Servando Snare  . Hypotension   . Strep throat   . Diarrhea   . Weakness   . Bronchitis, acute, with bronchospasm   . Asthmatic bronchitis   . Constipation, chronic   . Sleep apnea   . Facial pain   . Fibrocystic breast disease   . Neck pain   . Anxiety   . Osteopenia   . Hyperlipidemia   . Diverticulosis   . Stress-induced cardiomyopathy     a. echo 09/28/2014: EF 45-50%, severe HK of mid-distal anterior, apical and mid-distal inferior walls suggestive of stress induced CM, mildly dilated LA, mildly dilated PASP, mild TR, trivial pericardial effusion  . Hypertension    Family History  Problem Relation Age of Onset  . Diabetes Daughter   . Kidney disease Daughter   . Cancer Maternal Aunt     breast  . Diabetes Maternal Grandmother   . Cancer Cousin     colon  . Lung cancer Mother     smoked  . COPD Mother     smoked  . COPD Father     smoked  . Prostate cancer  Father   . Kidney failure Father    Past Surgical History  Procedure Laterality Date  . Abdominal hysterectomy      partial  . Nasal sinus surgery    . Cardiac catheterization  09/29/2014    [facility]   Social History   Social History  . Marital Status: Married    Spouse Name: N/A  . Number of Children: N/A  . Years of Education: N/A   Social History Main Topics  . Smoking status: Former Smoker -- 0.25 packs/day for 40 years    Types: Cigarettes    Quit date: 11/24/2012  . Smokeless tobacco: Never Used     Comment: Uses nicotrol inhaler on occasion  . Alcohol Use: No  . Drug Use: No  . Sexual Activity: Not Asked   Other Topics Concern  . None   Social History Narrative   Lives with husband in [place]. 2 dogs. Works at [employer].    Review of Systems  Constitutional: Negative for fever, chills, appetite change, fatigue and unexpected weight change.  Eyes: Negative for visual disturbance.  Respiratory: Negative for cough, chest tightness and shortness of breath.   Cardiovascular: Negative for chest pain and leg swelling.  Gastrointestinal: Negative  for nausea, vomiting, abdominal pain, diarrhea and constipation.  Musculoskeletal: Negative for myalgias and arthralgias.  Skin: Negative for color change and rash.  Neurological: Positive for dizziness. Negative for syncope, weakness, light-headedness, numbness and headaches.  Hematological: Negative for adenopathy. Does not bruise/bleed easily.  Psychiatric/Behavioral: Negative for sleep disturbance and dysphoric mood. The patient is not nervous/anxious.        Objective:    BP 90/58 mmHg  Pulse 75  Ht 5\' 6"  (1.676 m)  Wt 143 lb 9.6 oz (65.137 kg)  BMI 23.19 kg/m2  SpO2 93% Physical Exam  Constitutional: She is oriented to person, place, and time. She appears well-developed and well-nourished. No distress.  HENT:  Head: Normocephalic and atraumatic.  Right Ear: External ear normal.  Left Ear: External ear normal.    Nose: Nose normal.  Mouth/Throat: Oropharynx is clear and moist. No oropharyngeal exudate.  Eyes: Conjunctivae are normal. Pupils are equal, round, and reactive to light. Right eye exhibits no discharge. Left eye exhibits no discharge. No scleral icterus.  Neck: Normal range of motion. Neck supple. No tracheal deviation present. No thyromegaly present.  Cardiovascular: Normal rate, regular rhythm, normal heart sounds and intact distal pulses.  Exam reveals no gallop and no friction rub.   No murmur heard. Pulmonary/Chest: Effort normal and breath sounds normal. No respiratory distress. She has no wheezes. She has no rales. She exhibits no tenderness.  Musculoskeletal: Normal range of motion. She exhibits no edema or tenderness.  Lymphadenopathy:    She has no cervical adenopathy.  Neurological: She is alert and oriented to person, place, and time. No cranial nerve deficit. She exhibits normal muscle tone. Coordination normal.  Skin: Skin is warm and dry. No rash noted. She is not diaphoretic. No erythema. No pallor.  Psychiatric: She has a normal mood and affect. Her behavior is normal. Judgment and thought content normal.          Assessment & Plan:   Problem List Items Addressed This Visit      Unprioritized   BPV (benign positional vertigo) - Primary    Encouraged follow up with ENT for Epley maneuver as needed.       Relevant Orders   CBC with Differential/Platelet   B12   Diabetes mellitus type 2, controlled (HCC)    Will check A1c with labs. Continue Metformin.      Relevant Medications   valsartan (DIOVAN) 160 MG tablet   metFORMIN (GLUCOPHAGE) 500 MG tablet   Other Relevant Orders   Comprehensive metabolic panel   Hemoglobin A1c   Essential hypertension, benign    BP Readings from Last 3 Encounters:  05/20/16 90/58  03/11/16 121/78  02/28/16 122/84   BP on low side. She reports she was recently seen by cardiology and no medication changes made. Will monitor.  May need to decrease dose of Valsartan and/or metoprolol if persistent hypotension.      Relevant Medications   valsartan (DIOVAN) 160 MG tablet   metoprolol succinate (TOPROL-XL) 25 MG 24 hr tablet       Return in about 3 months (around 08/20/2016) for New Patient.  Ronna PolioJennifer Sefora Tietje, MD Internal Medicine [facility]

## 2016-05-21 LAB — CBC WITH DIFFERENTIAL/PLATELET
BASOS PCT: 0.5 % (ref 0.0–3.0)
Basophils Absolute: 0 10*3/uL (ref 0.0–0.1)
EOS ABS: 0.1 10*3/uL (ref 0.0–0.7)
EOS PCT: 1.4 % (ref 0.0–5.0)
HCT: 41.5 % (ref 36.0–46.0)
Hemoglobin: 13.9 g/dL (ref 12.0–15.0)
LYMPHS ABS: 1.7 10*3/uL (ref 0.7–4.0)
Lymphocytes Relative: 27 % (ref 12.0–46.0)
MCHC: 33.4 g/dL (ref 30.0–36.0)
MCV: 90.2 fl (ref 78.0–100.0)
MONO ABS: 0.4 10*3/uL (ref 0.1–1.0)
Monocytes Relative: 6.4 % (ref 3.0–12.0)
NEUTROS ABS: 4.2 10*3/uL (ref 1.4–7.7)
NEUTROS PCT: 64.7 % (ref 43.0–77.0)
PLATELETS: 201 10*3/uL (ref 150.0–400.0)
RBC: 4.6 Mil/uL (ref 3.87–5.11)
RDW: 14.8 % (ref 11.5–15.5)
WBC: 6.5 10*3/uL (ref 4.0–10.5)

## 2016-05-28 ENCOUNTER — Ambulatory Visit (INDEPENDENT_AMBULATORY_CARE_PROVIDER_SITE_OTHER): Payer: Medicare Other

## 2016-05-28 ENCOUNTER — Other Ambulatory Visit: Payer: Self-pay | Admitting: Internal Medicine

## 2016-05-28 ENCOUNTER — Ambulatory Visit: Payer: Medicare Other | Admitting: Urology

## 2016-05-28 DIAGNOSIS — E538 Deficiency of other specified B group vitamins: Secondary | ICD-10-CM | POA: Diagnosis not present

## 2016-05-28 MED ORDER — CYANOCOBALAMIN 1000 MCG/ML IJ SOLN
1000.0000 ug | Freq: Once | INTRAMUSCULAR | Status: AC
  Administered 2016-05-28: 1000 ug via INTRAMUSCULAR

## 2016-05-28 NOTE — Progress Notes (Signed)
Patient came in for a b12 injection.  Received in Left deltoid.  Patient tolerated well.  

## 2016-05-29 ENCOUNTER — Ambulatory Visit
Admission: RE | Admit: 2016-05-29 | Discharge: 2016-05-29 | Disposition: A | Discharge status: Home / Self Care | Payer: Medicare Other | Source: Ambulatory Visit | Attending: Internal Medicine | Admitting: Internal Medicine

## 2016-05-29 ENCOUNTER — Other Ambulatory Visit: Payer: Self-pay | Admitting: Internal Medicine

## 2016-05-29 DIAGNOSIS — Z1231 Encounter for screening mammogram for malignant neoplasm of breast: Secondary | ICD-10-CM | POA: Insufficient documentation

## 2016-05-30 NOTE — Telephone Encounter (Signed)
Refill request for Xanax, last seen [id number], last filled [id number].  Please advise.

## 2016-06-03 ENCOUNTER — Other Ambulatory Visit: Payer: Self-pay | Admitting: Internal Medicine

## 2016-07-01 ENCOUNTER — Ambulatory Visit (INDEPENDENT_AMBULATORY_CARE_PROVIDER_SITE_OTHER): Payer: Medicare Other

## 2016-07-01 DIAGNOSIS — E538 Deficiency of other specified B group vitamins: Secondary | ICD-10-CM | POA: Diagnosis not present

## 2016-07-01 MED ORDER — CYANOCOBALAMIN 1000 MCG/ML IJ SOLN
1000.0000 ug | Freq: Once | INTRAMUSCULAR | Status: AC
  Administered 2016-07-01: 1000 ug via INTRAMUSCULAR

## 2016-07-01 NOTE — Progress Notes (Signed)
Pt is in receiving a B12 in the right deltoid. Pt tolerated well.

## 2016-08-01 ENCOUNTER — Ambulatory Visit (INDEPENDENT_AMBULATORY_CARE_PROVIDER_SITE_OTHER): Payer: 59 | Admitting: Psychology

## 2016-08-01 DIAGNOSIS — F331 Major depressive disorder, recurrent, moderate: Secondary | ICD-10-CM | POA: Diagnosis not present

## 2016-08-05 ENCOUNTER — Ambulatory Visit (INDEPENDENT_AMBULATORY_CARE_PROVIDER_SITE_OTHER): Payer: Medicare Other | Admitting: Internal Medicine

## 2016-08-05 ENCOUNTER — Encounter: Payer: Self-pay | Admitting: Internal Medicine

## 2016-08-05 VITALS — BP 100/68 | HR 74 | Temp 98.7°F | Ht 64.75 in | Wt 146.0 lb

## 2016-08-05 DIAGNOSIS — G4733 Obstructive sleep apnea (adult) (pediatric): Secondary | ICD-10-CM

## 2016-08-05 DIAGNOSIS — F32A Depression, unspecified: Secondary | ICD-10-CM

## 2016-08-05 DIAGNOSIS — J449 Chronic obstructive pulmonary disease, unspecified: Secondary | ICD-10-CM

## 2016-08-05 DIAGNOSIS — E7800 Pure hypercholesterolemia, unspecified: Secondary | ICD-10-CM

## 2016-08-05 DIAGNOSIS — F419 Anxiety disorder, unspecified: Secondary | ICD-10-CM

## 2016-08-05 DIAGNOSIS — Z23 Encounter for immunization: Secondary | ICD-10-CM

## 2016-08-05 DIAGNOSIS — E119 Type 2 diabetes mellitus without complications: Secondary | ICD-10-CM

## 2016-08-05 DIAGNOSIS — F329 Major depressive disorder, single episode, unspecified: Secondary | ICD-10-CM

## 2016-08-05 DIAGNOSIS — E538 Deficiency of other specified B group vitamins: Secondary | ICD-10-CM

## 2016-08-05 DIAGNOSIS — H8110 Benign paroxysmal vertigo, unspecified ear: Secondary | ICD-10-CM

## 2016-08-05 DIAGNOSIS — I10 Essential (primary) hypertension: Secondary | ICD-10-CM

## 2016-08-05 MED ORDER — CYANOCOBALAMIN 1000 MCG/ML IJ SOLN
1000.0000 ug | Freq: Once | INTRAMUSCULAR | Status: AC
  Administered 2016-08-05: 1000 ug via INTRAMUSCULAR

## 2016-08-05 MED ORDER — SERTRALINE HCL 100 MG PO TABS: 150.0000 mg | ORAL_TABLET | Freq: Two times a day (BID) | ORAL | 0 refills | Status: DC

## 2016-08-05 MED ORDER — BUSPIRONE HCL 5 MG PO TABS: 5.0000 mg | ORAL_TABLET | Freq: Every day | ORAL | 2 refills | Status: DC | PRN

## 2016-08-05 NOTE — Assessment & Plan Note (Signed)
Continue Spiriva, Advair and Ventolin Discussed smoking cessation

## 2016-08-05 NOTE — Patient Instructions (Signed)
Generalized Anxiety Disorder Generalized anxiety disorder (GAD) is a mental disorder. It interferes with life functions, including relationships, work, and school. GAD is different from normal anxiety, which everyone experiences at some point in their lives in response to specific life events and activities. Normal anxiety actually helps us prepare for and get through these life events and activities. Normal anxiety goes away after the event or activity is over.  GAD causes anxiety that is not necessarily related to specific events or activities. It also causes excess anxiety in proportion to specific events or activities. The anxiety associated with GAD is also difficult to control. GAD can vary from mild to severe. People with severe GAD can have intense waves of anxiety with physical symptoms (panic attacks).  SYMPTOMS The anxiety and worry associated with GAD are difficult to control. This anxiety and worry are related to many life events and activities and also occur more days than not for 6 months or longer. People with GAD also have three or more of the following symptoms (one or more in children):  Restlessness.   Fatigue.  Difficulty concentrating.   Irritability.  Muscle tension.  Difficulty sleeping or unsatisfying sleep. DIAGNOSIS GAD is diagnosed through an assessment by your health care provider. Your health care provider will ask you questions aboutyour mood,physical symptoms, and events in your life. Your health care provider may ask you about your medical history and use of alcohol or drugs, including prescription medicines. Your health care provider may also do a physical exam and blood tests. Certain medical conditions and the use of certain substances can cause symptoms similar to those associated with GAD. Your health care provider may refer you to a mental health specialist for further evaluation. TREATMENT The following therapies are usually used to treat GAD:    Medication. Antidepressant medication usually is prescribed for long-term daily control. Antianxiety medicines may be added in severe cases, especially when panic attacks occur.   Talk therapy (psychotherapy). Certain types of talk therapy can be helpful in treating GAD by providing support, education, and guidance. A form of talk therapy called cognitive behavioral therapy can teach you healthy ways to think about and react to daily life events and activities.  Stress managementtechniques. These include yoga, meditation, and exercise and can be very helpful when they are practiced regularly. A mental health specialist can help determine which treatment is best for you. Some people see improvement with one therapy. However, other people require a combination of therapies.   This information is not intended to replace advice given to you by your health care provider. Make sure you discuss any questions you have with your health care provider.   Document Released: 02/14/2013 Document Revised: 11/10/2014 Document Reviewed: 02/14/2013 Elsevier Interactive Patient Education 2016 Elsevier Inc.  

## 2016-08-05 NOTE — Assessment & Plan Note (Signed)
Controlled on Valsartan and Metoprolol

## 2016-08-05 NOTE — Assessment & Plan Note (Signed)
A1C at goal Continue Metformin as prescribed Flu shot today Pneumovax and Prevnar UTD Foot exam UTD Encouraged yearly eye exams Encouraged her to consume a low fat, low carb diet and exercise to lose weight No microalbumin secondary to ARB therapy

## 2016-08-05 NOTE — Assessment & Plan Note (Signed)
Encouraged weight loss Continue use of CPAP machine

## 2016-08-05 NOTE — Addendum Note (Signed)
Addended by: Roena MaladyEVONTENNO, MELANIE Y on: 08/05/2016 01:28 PM   Modules accepted: Orders

## 2016-08-05 NOTE — Assessment & Plan Note (Signed)
Deteriorated Increase Zoloft to 150 mg BID

## 2016-08-05 NOTE — Assessment & Plan Note (Signed)
Stable on Zoloft and Xanax

## 2016-08-05 NOTE — Assessment & Plan Note (Signed)
No recent flare.  

## 2016-08-05 NOTE — Assessment & Plan Note (Signed)
Continue Crestor Encouraged her to consume a low fat diet

## 2016-08-05 NOTE — Progress Notes (Signed)
HPI  Pt presents to the clinic today to establish care and for management of the conditions listed below. She is transferring care from Dr. Dan HumphreysWalker.  DM 2: Her sugars range 88-130. She takes Metformin with good relief. Her flu shot was 07/2015. Her pneumovax and prevnar are UTD. Her last eye exam was 01/2016. She is having some burning in her feet. She reports this has been going on for years. She does not take anything for this (she failed Neurontin in the past).  Anxiety and Depression: Panic attacks triggered by claustrophobic events. Sometimes they occur for other reasons. She takes Zoloft and Xanax. She reports she takes 1/4 tablet in the morning, 1 tablet at lunch, always when driving on the interstate, and 1 tab before bed. She recently saw Dr. Laymond PurserPerrin this week and thinks her Zoloft needs to be increased d/t worsening depression. She denies SI/HI.  COPD: She reports she recently started smoking again after quitting for the last few years. She takes Spiriva, Advair and Venolin as prescribed. She follows with Dr. Kendrick FriesMcQuaid.  HLD: She takes Crestor as prescribed. She denies myalgias. She does not consume a low fat diet.  HTN with cardiomyopathy: She takes Valsartan and Metoprolol as prescribed. Her BP today is 100/68. ECG from 02/2016 reviewed. She follows with Dr. Kirke CorinArida.  OSA: She wears CPAP machine. She gets about 5-6 hours per night. She does not feel rested when she wakes up and she takes a nap during the day.   BPPV: She does not take the Meclizine because her insurance will not pay for it. She reports she has not had a bout of bad dizziness in the last month.  Flu: 07/2015 Tetanus: 2009 Pneumovax: 09/2012 Prevnar: 05/2014 Zostovax: 02/2010 Pap Smear: 10/2013 Mammogram: 05/2016 Colon Screening: 02/2009, hx of colon polyps- 5 years Bone Density: 05/2013 Vision Screening: 01/2016 Dentist: biannually  Past Medical History:  Diagnosis Date  . Anxiety   . Asthmatic bronchitis   . Bronchitis,  acute, with bronchospasm   . Constipation, chronic   . COPD (chronic obstructive pulmonary disease) (HCC)   . Diabetes mellitus   . Diarrhea   . Diverticulitis   . Diverticulosis   . Facial pain   . Fibrocystic breast disease   . Hyperlipidemia   . Hypertension   . Hypotension   . Neck pain   . Osteopenia   . S/P colonoscopy 2010   Dr. Servando SnareWohl  . Sleep apnea   . Strep throat   . Stress-induced cardiomyopathy    a. echo 09/28/2014: EF 45-50%, severe HK of mid-distal anterior, apical and mid-distal inferior walls suggestive of stress induced CM, mildly dilated LA, mildly dilated PASP, mild TR, trivial pericardial effusion  . Weakness     Current Outpatient Prescriptions  Medication Sig Dispense Refill  . ADVAIR DISKUS 250-50 MCG/DOSE AEPB INHALE ONE PUFF INTO THE LUNGS TWICE DAILY 60 each 5  . albuterol (VENTOLIN HFA) 108 (90 BASE) MCG/ACT inhaler INHALE TWO PUFFS INTO THE LUNGS EVERY FOUR HOURS AS NEEDED FOR WHEEZING 18 Inhaler 11  . ALPRAZolam (XANAX) 0.5 MG tablet TAKE 1 TABLET BY MOUTH 3 TIMES A DAY AS NEEDED 90 tablet 1  . Cholecalciferol (VITAMIN D-3) 1000 UNITS CAPS Take 2 capsules by mouth.     . metFORMIN (GLUCOPHAGE) 500 MG tablet TAKE 1 TABLET BY MOUTH TWICE A DAY WITH A MEAL 180 tablet 3  . metoprolol succinate (TOPROL-XL) 25 MG 24 hr tablet Take 1 tablet (25 mg total) by mouth daily. 90  tablet 3  . ONE TOUCH ULTRA TEST test strip TEST BLOOD SUGAR AT HOME TWICE A DAY 100 each 5  . OXYGEN-HELIUM IN Inhale 2 % into the lungs at bedtime.      . rosuvastatin (CRESTOR) 5 MG tablet Take 1 tablet (5 mg total) by mouth daily. 90 tablet 3  . sertraline (ZOLOFT) 100 MG tablet TAKE 1 TABLET (100 MG TOTAL) BY MOUTH 2 (TWO) TIMES DAILY. 180 tablet 3  . SPIRIVA HANDIHALER 18 MCG inhalation capsule INHALE 1 CAP VIA INHALER DAILY 90 capsule 3  . valsartan (DIOVAN) 160 MG tablet Take 1 tablet (160 mg total) by mouth daily. 90 tablet 3  . albuterol (PROVENTIL) (2.5 MG/3ML) 0.083% nebulizer  solution Take 3 mLs (2.5 mg total) by nebulization every 6 (six) hours as needed for wheezing or shortness of breath. (Patient not taking: Reported on 08/05/2016) 75 mL 12  . meclizine (ANTIVERT) 25 MG tablet Take 1 tablet (25 mg total) by mouth as needed for dizziness. (Patient not taking: Reported on 08/05/2016) 30 tablet 11   No current facility-administered medications for this visit.     Allergies  Allergen Reactions  . Amoxicillin-Pot Clavulanate     REACTION: stomach sensitive.  Lenice Llamas [Roflumilast]     Severe nausea   . Erythromycin Base     REACTION: GI upset  . Neurontin [Gabapentin]     anxiety    Family History  Problem Relation Age of Onset  . Diabetes Daughter   . Kidney disease Daughter   . Lung cancer Mother     smoked  . COPD Mother     smoked  . COPD Father     smoked  . Prostate cancer Father   . Kidney failure Father   . Breast cancer Maternal Aunt   . Diabetes Maternal Grandmother   . Breast cancer Maternal Grandmother   . Cancer Cousin     colon    Social History   Social History  . Marital status: Married    Spouse name: N/A  . Number of children: N/A  . Years of education: N/A   Occupational History  . Not on file.   Social History Main Topics  . Smoking status: Current Some Day Smoker    Packs/day: 0.25    Years: 40.00    Types: Cigarettes    Last attempt to quit: 11/24/2012  . Smokeless tobacco: Never Used     Comment: Uses nicotrol inhaler on occasion  . Alcohol use No  . Drug use: No  . Sexual activity: Not Currently   Other Topics Concern  . Not on file   Social History Narrative   Lives with husband in [place]. 2 dogs. Works at [employer].    ROS:  Constitutional: Pt reports fatigue. Denies fever, malaise, headache or abrupt weight changes.  HEENT: Denies eye pain, eye redness, ear pain, ringing in the ears, wax buildup, runny nose, nasal congestion, bloody nose, or sore throat. Respiratory: Denies difficulty breathing,  shortness of breath, cough or sputum production.   Cardiovascular: Denies chest pain, chest tightness, palpitations or swelling in the hands or feet.  Gastrointestinal: Denies abdominal pain, bloating, constipation, diarrhea or blood in the stool.  GU: Denies frequency, urgency, pain with urination, blood in urine, odor or discharge. Musculoskeletal: Denies decrease in range of motion, difficulty with gait, muscle pain or joint pain and swelling.  Skin: Denies redness, rashes, lesions or ulcercations.  Neurological: Pt reports burning sensation in feet. Denies dizziness, difficulty  with memory, difficulty with speech or problems with balance and coordination.  Psych: Pt has history of anxiety and depression. Denies SI/HI.  No other specific complaints in a complete review of systems (except as listed in HPI above).  PE:  BP 100/68   Pulse 74   Temp 98.7 F (37.1 C) (Oral)   Ht 5' 4.75" (1.645 m)   Wt 146 lb (66.2 kg)   SpO2 97%   BMI 24.48 kg/m   Wt Readings from Last 3 Encounters:  08/05/16 146 lb (66.2 kg)  05/20/16 143 lb 9.6 oz (65.1 kg)  02/28/16 144 lb (65.3 kg)    General: Appears her stated age, well developed, well nourished in NAD. HEENT: Head: normal shape and size; Eyes: sclera white, no icterus, conjunctiva pink, PERRLA and EOMs intact;  Neck: Neck supple, trachea midline. No masses, lumps or thyromegaly present.  Cardiovascular: Normal rate and rhythm. S1,S2 noted.  No murmur, rubs or gallops noted.  Pulmonary/Chest: Normal effort and positive vesicular breath sounds. No respiratory distress. No wheezes, rales or ronchi noted.  Musculoskeletal:  No difficulty with gait.  Neurological: Alert and oriented. Sensation intact to BLE. Psychiatric: Mood and affect normal.   BMET    Component Value Date/Time   NA 138 05/20/2016 1351   NA 140 09/28/2014 0637   K 4.3 05/20/2016 1351   K 4.3 09/28/2014 0637   CL 102 05/20/2016 1351   CL 108 (H) 09/28/2014 0637   CO2  31 05/20/2016 1351   CO2 26 09/28/2014 0637   GLUCOSE 129 (H) 05/20/2016 1351   GLUCOSE 117 (H) 09/28/2014 0637   BUN 16 05/20/2016 1351   BUN 13 09/28/2014 0637   CREATININE 0.75 05/20/2016 1351   CREATININE 0.83 09/28/2014 0637   CREATININE TEST NOT PERFORMED 11/24/2011 0000   CALCIUM 10.1 05/20/2016 1351   CALCIUM 7.7 (L) 09/28/2014 0637   GFRNONAA >60 09/28/2014 0637   GFRAA >60 09/28/2014 0637    Lipid Panel     Component Value Date/Time   CHOL 150 10/17/2015 1138   CHOL 110 09/28/2014 0637   TRIG 83.0 10/17/2015 1138   TRIG 102 09/28/2014 0637   HDL 66.60 10/17/2015 1138   HDL 54 09/28/2014 0637   CHOLHDL 2 10/17/2015 1138   VLDL 16.6 10/17/2015 1138   VLDL 20 09/28/2014 0637   LDLCALC 66 10/17/2015 1138   LDLCALC 36 09/28/2014 0637    CBC    Component Value Date/Time   WBC 6.5 05/20/2016 1351   RBC 4.60 05/20/2016 1351   HGB 13.9 05/20/2016 1351   HGB 13.0 09/30/2014 0525   HCT 41.5 05/20/2016 1351   HCT 39.0 09/30/2014 0525   PLT 201.0 05/20/2016 1351   PLT 234 09/30/2014 0525   MCV 90.2 05/20/2016 1351   MCV 93 09/30/2014 0525   MCH 30.9 09/30/2014 0525   MCHC 33.4 05/20/2016 1351   RDW 14.8 05/20/2016 1351   RDW 14.1 09/30/2014 0525   LYMPHSABS 1.7 05/20/2016 1351   LYMPHSABS 1.3 09/30/2014 0525   MONOABS 0.4 05/20/2016 1351   MONOABS 0.5 09/30/2014 0525   EOSABS 0.1 05/20/2016 1351   EOSABS 0.2 09/30/2014 0525   BASOSABS 0.0 05/20/2016 1351   BASOSABS 0.0 09/30/2014 0525    Hgb A1C Lab Results  Component Value Date   HGBA1C 6.2 05/20/2016     Assessment and Plan:

## 2016-08-19 ENCOUNTER — Other Ambulatory Visit: Payer: Self-pay | Admitting: Internal Medicine

## 2016-08-29 ENCOUNTER — Ambulatory Visit (INDEPENDENT_AMBULATORY_CARE_PROVIDER_SITE_OTHER): Payer: 59 | Admitting: Psychology

## 2016-08-29 DIAGNOSIS — F4323 Adjustment disorder with mixed anxiety and depressed mood: Secondary | ICD-10-CM

## 2016-09-03 ENCOUNTER — Other Ambulatory Visit: Payer: Self-pay | Admitting: Internal Medicine

## 2016-09-04 NOTE — Telephone Encounter (Signed)
Rx called in to pharmacy. 

## 2016-09-04 NOTE — Telephone Encounter (Signed)
Ok to phone in Xanax. Change sig to every 8 hours prn. 0 refills  *please change class to "phone in"

## 2016-09-04 NOTE — Telephone Encounter (Signed)
Last filled 06/01/16 with 1 refill by Dr Nani RavensWalker---you have not filled yet---please advise

## 2016-09-09 ENCOUNTER — Ambulatory Visit: Payer: Medicare Other

## 2016-09-10 ENCOUNTER — Telehealth: Payer: Self-pay

## 2016-09-10 NOTE — Telephone Encounter (Signed)
Yes, she can drop off a list

## 2016-09-10 NOTE — Telephone Encounter (Signed)
Pt left v/m pt went to seminar to stop smoking by hypnosis; pt has to take supplements while trying to stop smoking; pt [facility] to know if [facility] and be advised if pt can take these supplements. Pt request cb. Pt established care 08/05/16.

## 2016-09-11 NOTE — Telephone Encounter (Signed)
Pt is aware as instructed 

## 2016-09-12 ENCOUNTER — Institutional Professional Consult (permissible substitution): Payer: Self-pay | Admitting: Pulmonary Disease

## 2016-09-19 ENCOUNTER — Ambulatory Visit: Payer: 59 | Admitting: Psychology

## 2016-09-23 ENCOUNTER — Encounter: Payer: Self-pay | Admitting: Internal Medicine

## 2016-09-29 ENCOUNTER — Ambulatory Visit (INDEPENDENT_AMBULATORY_CARE_PROVIDER_SITE_OTHER): Payer: Medicare Other | Admitting: Pulmonary Disease

## 2016-09-29 ENCOUNTER — Encounter: Payer: Self-pay | Admitting: Pulmonary Disease

## 2016-09-29 VITALS — BP 124/72 | HR 86 | Ht 64.0 in | Wt 143.8 lb

## 2016-09-29 DIAGNOSIS — F17200 Nicotine dependence, unspecified, uncomplicated: Secondary | ICD-10-CM

## 2016-09-29 DIAGNOSIS — G4733 Obstructive sleep apnea (adult) (pediatric): Secondary | ICD-10-CM | POA: Diagnosis not present

## 2016-09-29 DIAGNOSIS — J449 Chronic obstructive pulmonary disease, unspecified: Secondary | ICD-10-CM | POA: Diagnosis not present

## 2016-09-29 DIAGNOSIS — R911 Solitary pulmonary nodule: Secondary | ICD-10-CM | POA: Diagnosis not present

## 2016-09-29 NOTE — Patient Instructions (Addendum)
1) Continue Spiriva and Advair as your maintenance inhalers 2) Continue Ventolin as your rescue inhaler 3) continue albuterol nebulizer as your emergency medication if the Ventolin does not give you sufficient relief 4) If you do not get sufficient relief of shortness of breath with the nebulizer, you need to be seen in an urgent care center or an emergency room 5) If you are using the albuterol nebulizer more than twice a day or more than two days in a row, you need to call to be seen 6) follow up in 4-6 weeks with lung function tests including a 6 minute walk test and with CXR  Congratulations on your success at smoking cessation. YOU MUST REMAIN ABSTINENT

## 2016-09-29 NOTE — Progress Notes (Signed)
PULMONARY CONSULT NOTE  Requesting MD/Service: Self Date of initial consultation: 09/29/16 Reason for consultation: COPD, smoker  PT PROFILE: 89 F smoker (abstinent X 1 month) previously followed by Dr Kendrick FriesMcQuaid for lung nodule and moderate COPD. Maintained on Spiriva, Advair, PRN albuterol with class III dyspnea  DATA: Spirometry 02/03/12: Moderate obstruction (FEV1 1.21 liters, 49% pred) CT chest 09/27/14: Stable right lower lobe pulmonary nodule over over 2 year interval consistent benign etiology   HPI:  As above. Hasn't been seen by Pulmonary Medicine for 2+ yrs. Has continued to smoke 1/2 ppd until approx one month ago. Underwent hypnosis which she believes has helped. She has class III dyspnea with some DTD variation. This is perhaps worse in autumn and made worse by exposure to strong odors. She is maintained on Spiriva, Advair and uses her albuterol rescue inhaler 4-5 times per day. She has nocturnal O2 and CPAP (dx'd with OSA many yrs ago). She wonders whether she need O2 during day. She was followed previously by Dr Kendrick FriesMcQuaid for a lung nodule but has had no imaging studies in the past couple of years  Past Medical History:  Diagnosis Date  . Anxiety   . Asthmatic bronchitis   . COPD (chronic obstructive pulmonary disease) (HCC)   . Depression   . Diabetes mellitus   . Diverticulitis   . Diverticulosis   . Hyperlipidemia   . Hypertension   . Osteopenia   . Sleep apnea   . Stress-induced cardiomyopathy    a. echo 09/28/2014: EF 45-50%, severe HK of mid-distal anterior, apical and mid-distal inferior walls suggestive of stress induced CM, mildly dilated LA, mildly dilated PASP, mild TR, trivial pericardial effusion    Past Surgical History:  Procedure Laterality Date  . ABDOMINAL HYSTERECTOMY     partial  . BREAST BIOPSY Right    neg  . CARDIAC CATHETERIZATION  09/29/2014   [facility]  . NASAL SINUS SURGERY      MEDICATIONS: I have reviewed all medications and confirmed  regimen as documented  Social History   Social History  . Marital status: Married    Spouse name: N/A  . Number of children: N/A  . Years of education: N/A   Occupational History  . Not on file.   Social History Main Topics  . Smoking status: Former Smoker    Packs/day: 0.25    Years: 40.00    Types: Cigarettes    Quit date: 09/01/2016  . Smokeless tobacco: Never Used     Comment: Uses nicotrol inhaler on occasion  . Alcohol use No  . Drug use: No  . Sexual activity: Not Currently   Other Topics Concern  . Not on file   Social History Narrative   Lives with [place] in [place]. 2 dogs. Works at [employer].    Family History  Problem Relation Age of Onset  . Diabetes Daughter   . Kidney disease Daughter   . Lung cancer Mother     smoked  . COPD Mother     smoked  . COPD Father     smoked  . Prostate cancer Father   . Kidney failure Father   . Breast cancer Maternal Aunt   . Diabetes Maternal Grandmother   . Breast cancer Maternal Grandmother   . Cancer Cousin     colon    ROS: No fever, myalgias/arthralgias, unexplained weight loss or weight gain No new focal weakness or sensory deficits No otalgia, hearing loss, visual changes, nasal and sinus symptoms, mouth  and throat problems No neck pain or adenopathy No abdominal pain, N/V/D, diarrhea, change in bowel pattern No dysuria, change in urinary pattern   Vitals:   09/29/16 0910  BP: 124/72  Pulse: 86  SpO2: 94%  Weight: 143 lb 12.8 oz (65.2 kg)  Height: 5\' 4"  (1.626 m)     EXAM:  Gen: WDWN, No overt respiratory distress HEENT: NCAT, sclera white, oropharynx normal Neck: Supple without LAN, thyromegaly, JVD Thorax - moderate kyphoscoliosis Lungs: breath sounds minimally diminished, percussion normal, No wheezes Cardiovascular: Reg, no murmurs Abdomen: Soft, nontender, normal BS Ext: without clubbing, cyanosis, edema Neuro: CNs grossly intact, motor and sensory intact Skin: Limited exam, no  lesions noted  DATA:   BMP Latest Ref Rng & Units 05/20/2016 10/17/2015 01/09/2015  Glucose 70 - 99 mg/dL 086(V129(H) 784(O111(H) 962(X122(H)  BUN 6 - 23 mg/dL 16 13 11   Creatinine 0.40 - 1.20 mg/dL 5.280.75 4.130.85 2.440.84  Sodium 135 - 145 mEq/L 138 138 140  Potassium 3.5 - 5.1 mEq/L 4.3 4.8 4.4  Chloride 96 - 112 mEq/L 102 101 103  CO2 19 - 32 mEq/L 31 32 32  Calcium 8.4 - 10.5 mg/dL 01.010.1 9.8 27.210.0    CBC Latest Ref Rng & Units 05/20/2016 09/30/2014 09/29/2014  WBC 4.0 - 10.5 K/uL 6.5 6.1 5.1  Hemoglobin 12.0 - 15.0 g/dL 53.613.9 64.413.0 03.412.4  Hematocrit 36.0 - 46.0 % 41.5 39.0 37.6  Platelets 150.0 - 400.0 K/uL 201.0 234 205    CXR:  No recent films  IMPRESSION:   1) Smoker - abstinent X 1 month. Previously 1/2 - 3 ppd 2) COPD - moderate obstruction by spirometry in 2013 3) Class III dyspnea - likely all attributable to COPD. Also has history of CAD 4) OSA with nocturnal hypoxemia - well treated with CPAP and nocturnal O2 5) RLL lung nodule - reportedly stable for > 2 yrs and deemed benign   PLAN:  1) I congratulated her on her success so far in smoking cessation and we discussed strategies to remain abstinent 2) Continue Spiriva and Advair as maintenance inhalers 3) Continue Ventolin as rescue inhaler 4) continue albuterol nebulizer as emergency medication if the Ventolin does not give sufficient relief 5) If using the albuterol nebulizer more than twice a day or more than two days in a row, she needs to call to be seen 6) follow up in 4-6 weeks with PFTs, 6 minute walk test and CXR   Billy Fischeravid Simonds, MD PCCM service Mobile [phone] Pager [phone] 09/29/2016

## 2016-10-03 IMAGING — CR DG KNEE COMPLETE 4+V*L*
5 series · 5 of 5 positions shown · non-contrast
Comparison: None.

CLINICAL DATA: Left knee pain and swelling.

EXAM:
LEFT KNEE - COMPLETE 4+ VIEW

[view not recorded (1 of 5)]
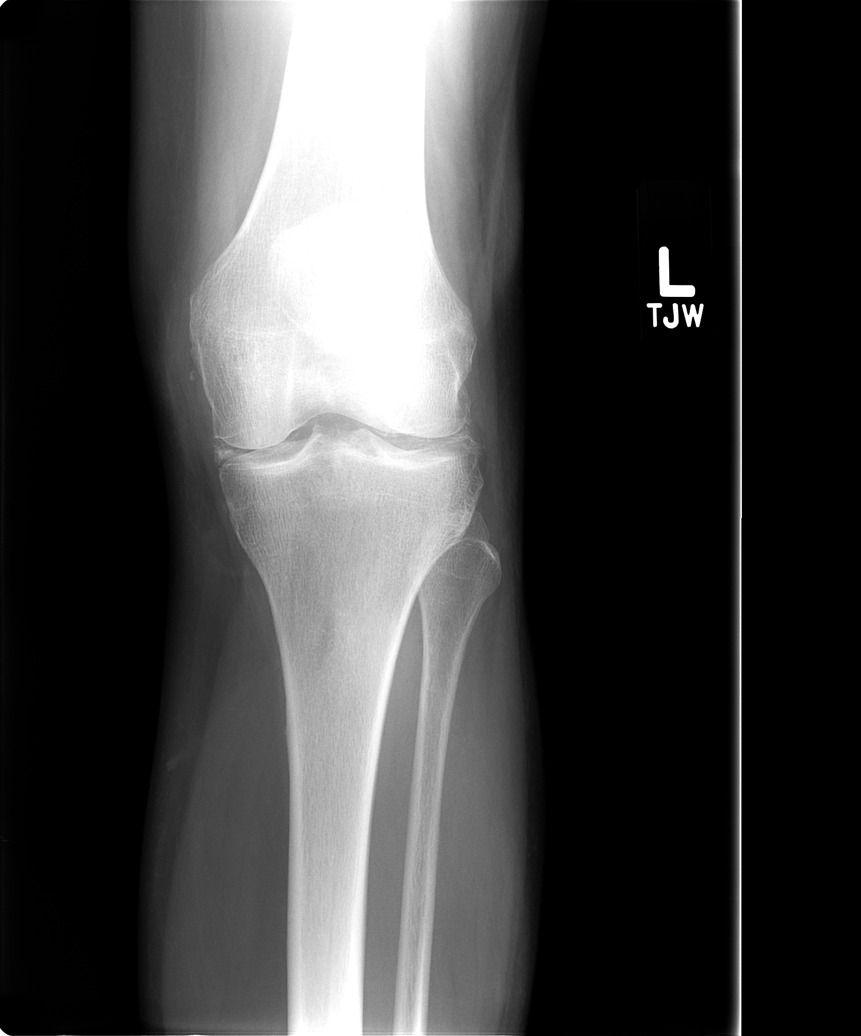

[view not recorded (2 of 5)]
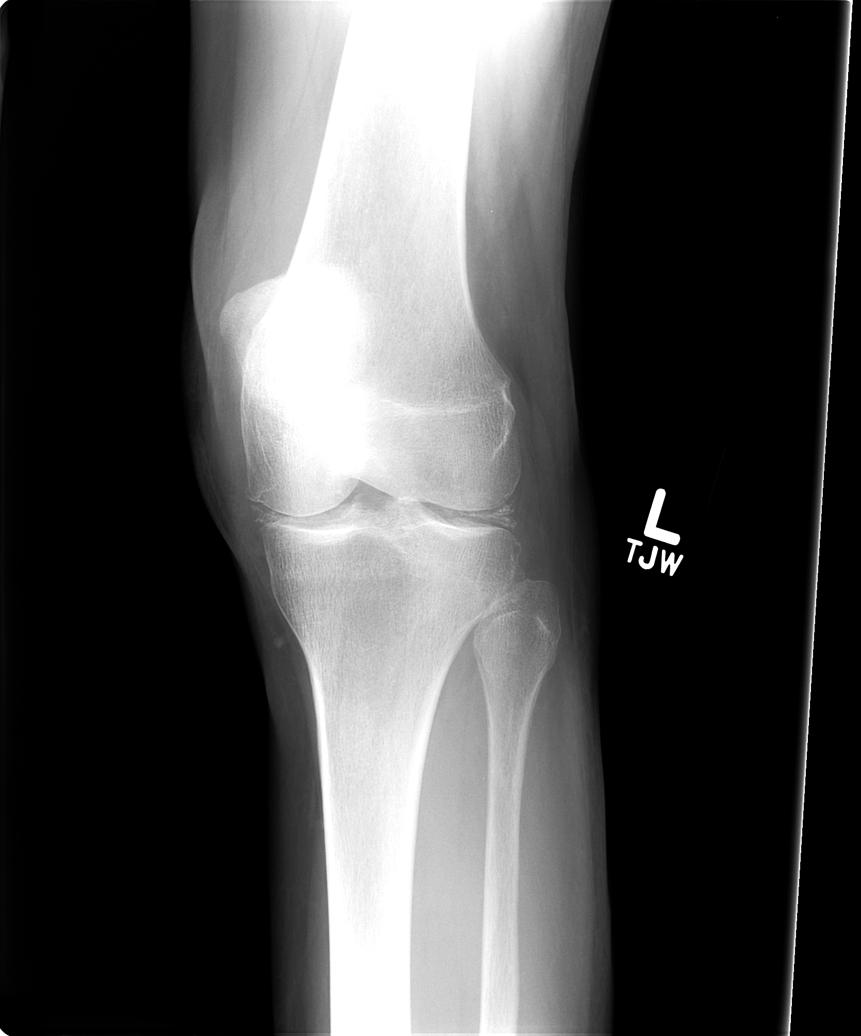

[view not recorded (3 of 5)]
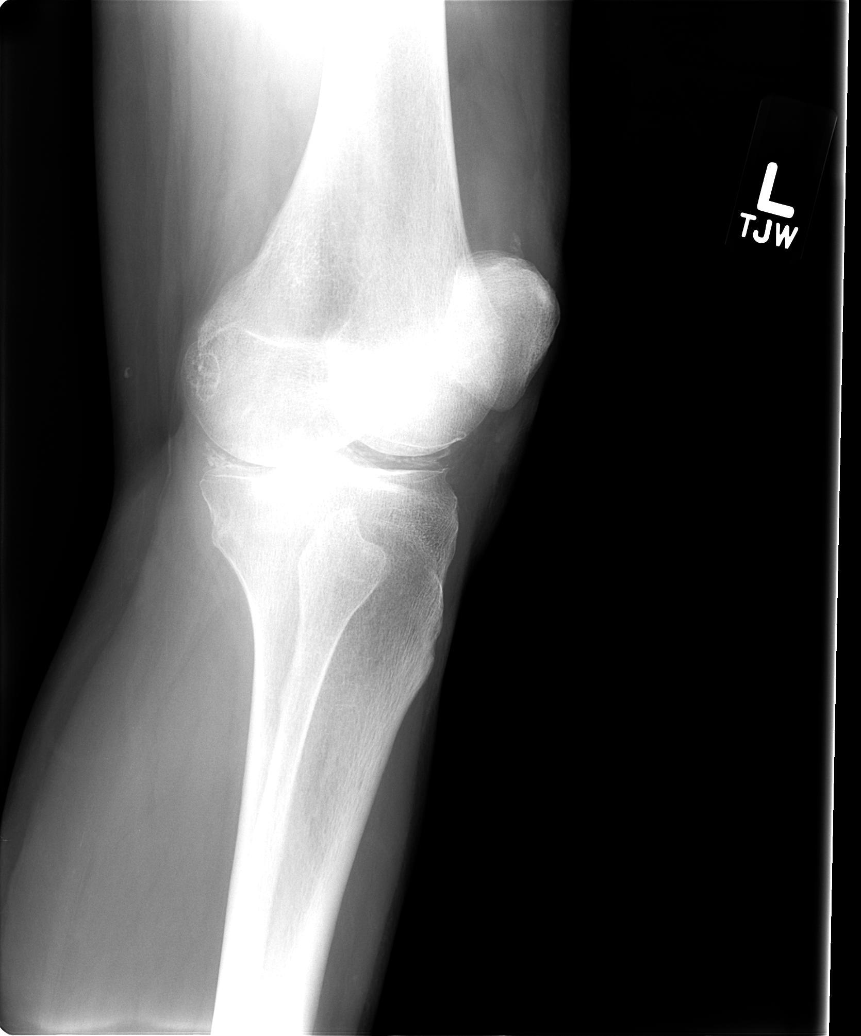

[view not recorded (4 of 5)]
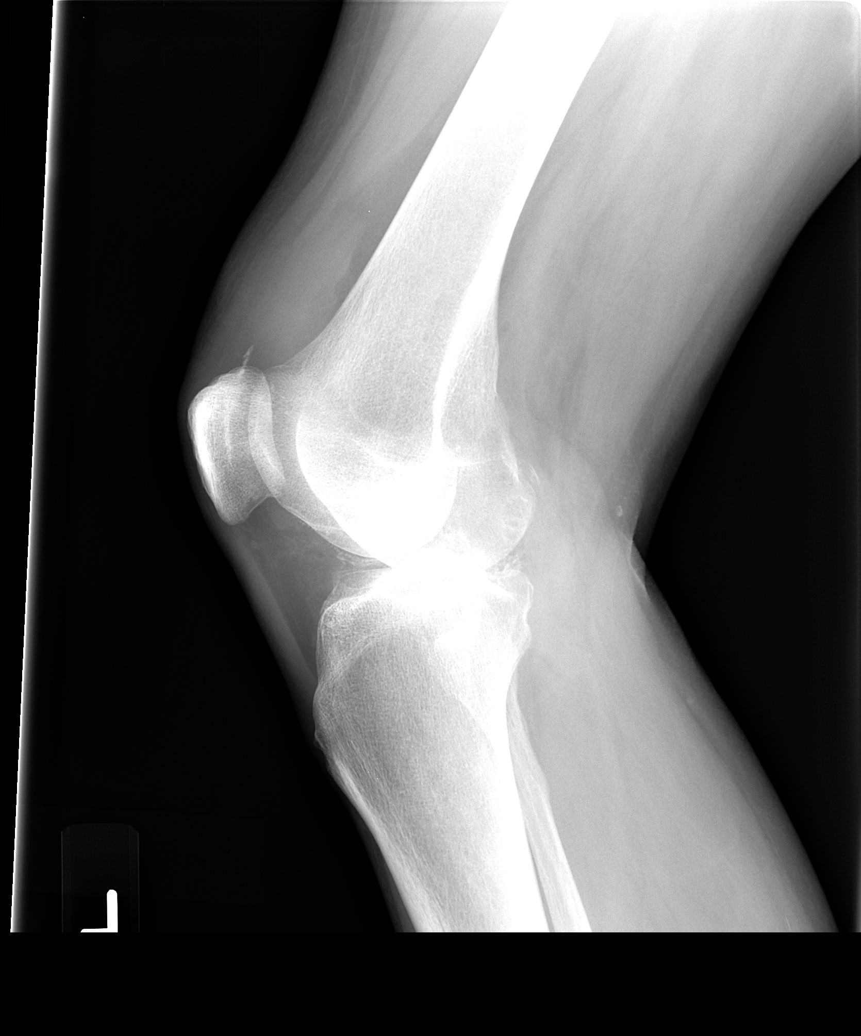

[view not recorded (5 of 5)]
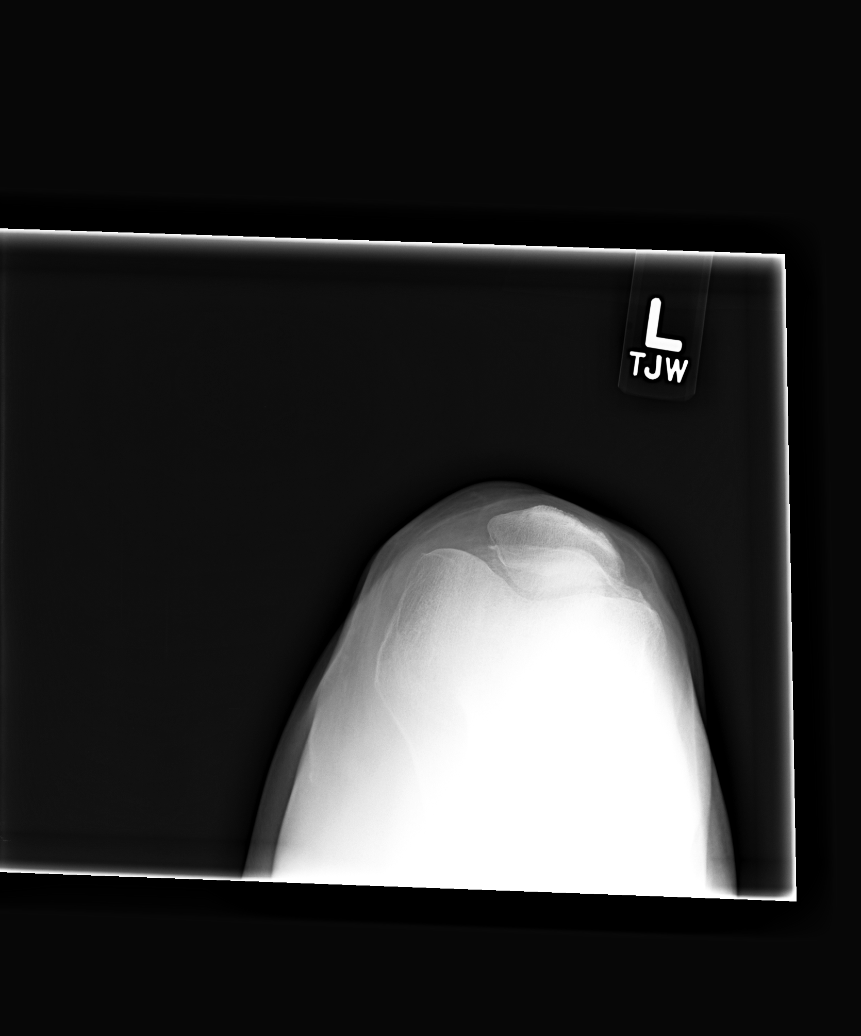

[5 of 5 positions shown; findings below may reference images not displayed]

FINDINGS: Mild suprapatellar joint effusion is noted. No fracture or
dislocation is noted. Mild narrowing of the medial and lateral joint
spaces is noted with associated chondrocalcinosis. No soft tissue
abnormality is noted.
IMPRESSION: Mild suprapatellar joint effusion. Mild degenerative joint disease.
No fracture or dislocation is noted.

## 2016-10-17 ENCOUNTER — Other Ambulatory Visit: Payer: Self-pay | Admitting: Internal Medicine

## 2016-10-17 NOTE — Telephone Encounter (Signed)
Left detailed msg on VM per HIPAA reminding pt's supplements are ready for pick up

## 2016-10-20 NOTE — Telephone Encounter (Signed)
Ok to phone in Xanax  *please change class to "phone in" 

## 2016-10-20 NOTE — Telephone Encounter (Signed)
Last filled 11/15/2015--please advise 

## 2016-10-21 ENCOUNTER — Other Ambulatory Visit: Payer: Self-pay | Admitting: Internal Medicine

## 2016-10-21 NOTE — Telephone Encounter (Signed)
Rx called in to pharmacy. 

## 2016-10-28 ENCOUNTER — Encounter: Payer: Self-pay | Admitting: Internal Medicine

## 2016-10-31 ENCOUNTER — Encounter: Payer: Self-pay | Admitting: Internal Medicine

## 2016-10-31 ENCOUNTER — Ambulatory Visit (INDEPENDENT_AMBULATORY_CARE_PROVIDER_SITE_OTHER): Payer: Medicare Other | Admitting: Internal Medicine

## 2016-10-31 VITALS — BP 102/66 | HR 89 | Temp 97.7°F | Wt 143.0 lb

## 2016-10-31 DIAGNOSIS — R110 Nausea: Secondary | ICD-10-CM

## 2016-10-31 DIAGNOSIS — R42 Dizziness and giddiness: Secondary | ICD-10-CM

## 2016-10-31 DIAGNOSIS — R140 Abdominal distension (gaseous): Secondary | ICD-10-CM | POA: Diagnosis not present

## 2016-10-31 LAB — CBC WITH DIFFERENTIAL/PLATELET
BASOS PCT: 0 %
Basophils Absolute: 0 cells/uL (ref 0–200)
EOS ABS: 189 {cells}/uL (ref 15–500)
Eosinophils Relative: 3 %
HEMATOCRIT: 41.5 % (ref 35.0–45.0)
HEMOGLOBIN: 13.6 g/dL (ref 11.7–15.5)
LYMPHS ABS: 1260 {cells}/uL (ref 850–3900)
LYMPHS PCT: 20 %
MCH: 29.2 pg (ref 27.0–33.0)
MCHC: 32.8 g/dL (ref 32.0–36.0)
MCV: 89.1 fL (ref 80.0–100.0)
MONO ABS: 693 {cells}/uL (ref 200–950)
MPV: 9.5 fL (ref 7.5–12.5)
Monocytes Relative: 11 %
Neutro Abs: 4158 cells/uL (ref 1500–7800)
Neutrophils Relative %: 66 %
Platelets: 292 10*3/uL (ref 140–400)
RBC: 4.66 MIL/uL (ref 3.80–5.10)
RDW: 14.1 % (ref 11.0–15.0)
WBC: 6.3 10*3/uL (ref 3.8–10.8)

## 2016-10-31 MED ORDER — ONDANSETRON HCL 4 MG PO TABS: 4.0000 mg | ORAL_TABLET | Freq: Three times a day (TID) | ORAL | 0 refills | Status: DC | PRN

## 2016-10-31 NOTE — Patient Instructions (Signed)
Nausea, Adult Introduction Feeling sick to your stomach (nausea) means that your stomach is upset or you feel like you have to throw up (vomit). Feeling sick to your stomach is usually not serious, but it may be an early sign of a more serious medical problem. As you feel sicker to your stomach, it can lead to throwing up (vomiting). If you throw up, or if you are not able to drink enough fluids, there is a risk of dehydration. Dehydration can make you feel tired and thirsty, have a dry mouth, and pee (urinate) less often. Older adults and people who have other diseases or a weak defense (immune) system have a higher risk of dehydration. The main goal of treating this condition is to:  Limit how often you feel sick to your stomach.  Prevent throwing up and dehydration. Follow these instructions at home: Follow instructions from your doctor about how to care for yourself at home. Eating and drinking Follow these recommendations as told by your doctor:  Take an oral rehydration solution (ORS). This is a drink that is sold at pharmacies and stores.  Drink clear fluids in small amounts as you are able, such as:  Water.  Ice chips.  Fruit juice that has water added (diluted fruit juice).  Low-calorie sports drinks.  Eat bland, easy to digest foods in small amounts as you are able, such as:  Bananas.  Applesauce.  Rice.  Lean meats.  Toast.  Crackers.  Avoid drinking fluids that contain a lot of sugar or caffeine.  Avoid alcohol.  Avoid spicy or fatty foods. General instructions  Drink enough fluid to keep your pee (urine) clear or pale yellow.  Wash your hands often. If you cannot use soap and water, use hand sanitizer.  Make sure that all people in your household wash their hands well and often.  Rest at home while you get better.  Take over-the-counter and prescription medicines only as told by your doctor.  Breathe slowly and deeply when you feel sick to your  stomach.  Watch your condition for any changes.  Keep all follow-up visits as told by your doctor. This is important. Contact a doctor if:  You have a headache.  You have new symptoms.  You feel sicker to your stomach.  You have a fever.  You feel light-headed or dizzy.  You throw up.  You are not able to keep fluids down. Get help right away if:  You have pain in your chest, neck, arm, or jaw.  You feel very weak or you pass out (faint).  You have throw up that is bright red or looks like coffee grounds.  You have bloody or black poop (stools), or poop that looks like tar.  You have a very bad headache, a stiff neck, or both.  You have very bad pain, cramping, or bloating in your belly.  You have a rash.  You have trouble breathing or you are breathing very quickly.  Your heart is beating very quickly.  Your skin feels cold and clammy.  You feel confused.  You have pain while peeing.  You have signs of dehydration, such as:  Dark pee, or very little or no pee.  Cracked lips.  Dry mouth.  Sunken eyes.  Sleepiness.  Weakness. These symptoms may be an emergency. Do not wait to see if the symptoms will go away. Get medical help right away. Call your local emergency services (911 in the [place] Do not drive yourself to the   hospital.  This information is not intended to replace advice given to you by your health care provider. Make sure you discuss any questions you have with your health care provider. Document Released: 10/09/2011 Document Revised: 03/27/2016 Document Reviewed: 06/26/2015  2017 [employer]  

## 2016-10-31 NOTE — Progress Notes (Signed)
Subjective:    Patient ID: Tami Skinner, female    DOB: 02-19-1946, 70 y.o.   MRN: [id number]  HPI  Pt presents to the clinic today with c/o intermittent nausea and dizziness. She describes the dizziness as more of an unsteadiness, not that the room spinning. She is not running into walls like she does with the BPPV. She reports the nausea occurs all the time, even without the dizziness. She has not vomited. She reports she has some epigastric fullness but denies reflux or heartburn. Her bowels are soft, but normal in color and odor. This is chronic for her, but seems to have flared up over the last 1-2 months. She has a history of BPPV. She has been prescribed Meclizine in the past, but reports she has not gotten it, because insurance will not cover it. She tried Zantac OTC but reports it did not help either.  Review of Systems      Past Medical History:  Diagnosis Date  . Anxiety   . Asthmatic bronchitis   . COPD (chronic obstructive pulmonary disease) (HCC)   . Depression   . Diabetes mellitus   . Diverticulitis   . Diverticulosis   . Hyperlipidemia   . Hypertension   . Osteopenia   . Sleep apnea   . Stress-induced cardiomyopathy    a. echo 09/28/2014: EF 45-50%, severe HK of mid-distal anterior, apical and mid-distal inferior walls suggestive of stress induced CM, mildly dilated LA, mildly dilated PASP, mild TR, trivial pericardial effusion    Current Outpatient Prescriptions  Medication Sig Dispense Refill  . ADVAIR DISKUS 250-50 MCG/DOSE AEPB INHALE ONE PUFF INTO THE LUNGS TWICE DAILY 60 each 5  . albuterol (PROVENTIL) (2.5 MG/3ML) 0.083% nebulizer solution Take 3 mLs (2.5 mg total) by nebulization every 6 (six) hours as needed for wheezing or shortness of breath. 75 mL 12  . ALPRAZolam (XANAX) 0.5 MG tablet TAKE 1 TABLET BY MOUTH EVERY 8 HOURS AS NEEDED 90 tablet 0  . Cholecalciferol (VITAMIN D-3) 1000 UNITS CAPS Take 2 capsules by mouth.     . metFORMIN (GLUCOPHAGE) 500  MG tablet TAKE 1 TABLET BY MOUTH TWICE A DAY WITH A MEAL 180 tablet 3  . metoprolol succinate (TOPROL-XL) 25 MG 24 hr tablet Take 1 tablet (25 mg total) by mouth daily. 90 tablet 3  . ONE TOUCH ULTRA TEST test strip TEST BLOOD SUGAR AT HOME TWICE A DAY 100 each 5  . OXYGEN-HELIUM IN Inhale 2 % into the lungs at bedtime.      . rosuvastatin (CRESTOR) 5 MG tablet Take 1 tablet (5 mg total) by mouth daily. 90 tablet 3  . sertraline (ZOLOFT) 100 MG tablet Take 1.5 tablets (150 mg total) by mouth 2 (two) times daily. 270 tablet 0  . SPIRIVA HANDIHALER 18 MCG inhalation capsule INHALE 1 CAPSULE VIA HANDIHALER DAILY 90 capsule 0  . valsartan (DIOVAN) 160 MG tablet Take 1 tablet (160 mg total) by mouth daily. 90 tablet 3  . VENTOLIN HFA 108 (90 Base) MCG/ACT inhaler INHALE TWO PUFFS INTO THE LUNGS EVERY FOUR HOURS AS NEEDED FOR WHEEZING 18 Inhaler 2   No current facility-administered medications for this visit.     Allergies  Allergen Reactions  . Amoxicillin-Pot Clavulanate     REACTION: stomach sensitive.  Lenice Llamas [Roflumilast]     Severe nausea   . Erythromycin Base     REACTION: GI upset  . Neurontin [Gabapentin]     anxiety  Family History  Problem Relation Age of Onset  . Diabetes Daughter   . Kidney disease Daughter   . Lung cancer Mother     smoked  . COPD Mother     smoked  . COPD Father     smoked  . Prostate cancer Father   . Kidney failure Father   . Breast cancer Maternal Aunt   . Diabetes Maternal Grandmother   . Breast cancer Maternal Grandmother   . Cancer Cousin     colon    Social History   Social History  . Marital status: Married    Spouse name: N/A  . Number of children: N/A  . Years of education: N/A   Occupational History  . Not on file.   Social History Main Topics  . Smoking status: Former Smoker    Packs/day: 0.25    Years: 40.00    Types: Cigarettes    Quit date: 09/01/2016  . Smokeless tobacco: Never Used     Comment: Uses  nicotrol inhaler on occasion  . Alcohol use No  . Drug use: No  . Sexual activity: Not Currently   Other Topics Concern  . Not on file   Social History Narrative   Lives with husband in [place]. 2 dogs. Works at [employer].     Constitutional: Denies fever, malaise, fatigue, headache or abrupt weight changes.  HEENT: Denies eye pain, eye redness, ear pain, ringing in the ears, wax buildup, runny nose, nasal congestion, bloody nose, or sore throat. Gastrointestinal: Pt reports nausea and bloating.. Denies abdominal pain, constipation, diarrhea or blood in the stool.  Neurological: Pt reports dizziness. Denies difficulty with memory, difficulty with speech or problems with coordination.    No other specific complaints in a complete review of systems (except as listed in HPI above).  Objective:   Physical Exam   BP 102/66   Pulse 89   Temp 97.7 F (36.5 C) (Oral)   Wt 143 lb (64.9 kg)   SpO2 93%   BMI 24.55 kg/m  Wt Readings from Last 3 Encounters:  10/31/16 143 lb (64.9 kg)  09/29/16 143 lb 12.8 oz (65.2 kg)  08/05/16 146 lb (66.2 kg)    General: Appears her stated age, well developed, well nourished in NAD. HEENT:  Ears: Tm's gray and intact, normal light reflex, +serous effusion noted on the right; Cardiovascular: Normal rate and rhythm. S1,S2 noted.   Abdomen: Soft and nontender. Hyperactive bowel sounds. No distention or masses noted.  Neurological: Alert and oriented. Coordination normal.    BMET    Component Value Date/Time   NA 138 05/20/2016 1351   NA 140 09/28/2014 0637   K 4.3 05/20/2016 1351   K 4.3 09/28/2014 0637   CL 102 05/20/2016 1351   CL 108 (H) 09/28/2014 0637   CO2 31 05/20/2016 1351   CO2 26 09/28/2014 0637   GLUCOSE 129 (H) 05/20/2016 1351   GLUCOSE 117 (H) 09/28/2014 0637   BUN 16 05/20/2016 1351   BUN 13 09/28/2014 0637   CREATININE 0.75 05/20/2016 1351   CREATININE 0.83 09/28/2014 0637   CREATININE TEST NOT PERFORMED 11/24/2011 0000    CALCIUM 10.1 05/20/2016 1351   CALCIUM 7.7 (L) 09/28/2014 0637   GFRNONAA >60 09/28/2014 0637   GFRAA >60 09/28/2014 0637    Lipid Panel     Component Value Date/Time   CHOL 150 10/17/2015 1138   CHOL 110 09/28/2014 0637   TRIG 83.0 10/17/2015 1138   TRIG 102 09/28/2014  [id number]   HDL 66.60 10/17/2015 1138   HDL 54 09/28/2014 0637   CHOLHDL 2 10/17/2015 1138   VLDL 16.6 10/17/2015 1138   VLDL 20 09/28/2014 0637   LDLCALC 66 10/17/2015 1138   LDLCALC 36 09/28/2014 0637    CBC    Component Value Date/Time   WBC 6.5 05/20/2016 1351   RBC 4.60 05/20/2016 1351   HGB 13.9 05/20/2016 1351   HGB 13.0 09/30/2014 0525   HCT 41.5 05/20/2016 1351   HCT 39.0 09/30/2014 0525   PLT 201.0 05/20/2016 1351   PLT 234 09/30/2014 0525   MCV 90.2 05/20/2016 1351   MCV 93 09/30/2014 0525   MCH 30.9 09/30/2014 0525   MCHC 33.4 05/20/2016 1351   RDW 14.8 05/20/2016 1351   RDW 14.1 09/30/2014 0525   LYMPHSABS 1.7 05/20/2016 1351   LYMPHSABS 1.3 09/30/2014 0525   MONOABS 0.4 05/20/2016 1351   MONOABS 0.5 09/30/2014 0525   EOSABS 0.1 05/20/2016 1351   EOSABS 0.2 09/30/2014 0525   BASOSABS 0.0 05/20/2016 1351   BASOSABS 0.0 09/30/2014 0525    Hgb A1C Lab Results  Component Value Date   HGBA1C 6.2 05/20/2016           Assessment & Plan:   Dizziness, nausea and bloating:  ETD vs GERD vs BBPV Try Flonase OTC Meclizine can be gotten OTC as well If you don't get Meclizine, eRx for Zofran 4 mg TID prn Can consider vestibular rehab as well Will check CBC, CMET, amylase, lipase and H pylori today  Will follow up after labs, RTC as needed Nicki ReaperBAITY, REGINA, NP

## 2016-11-01 LAB — COMPREHENSIVE METABOLIC PANEL
ALBUMIN: 4.2 g/dL (ref 3.6–5.1)
ALT: 12 U/L (ref 6–29)
AST: 18 U/L (ref 10–35)
Alkaline Phosphatase: 60 U/L (ref 33–130)
BUN: 14 mg/dL (ref 7–25)
CHLORIDE: 99 mmol/L (ref 98–110)
CO2: 23 mmol/L (ref 20–31)
CREATININE: 0.82 mg/dL (ref 0.60–0.93)
Calcium: 10.2 mg/dL (ref 8.6–10.4)
Glucose, Bld: 111 mg/dL — ABNORMAL HIGH (ref 65–99)
Potassium: 4.2 mmol/L (ref 3.5–5.3)
SODIUM: 138 mmol/L (ref 135–146)
Total Bilirubin: 0.4 mg/dL (ref 0.2–1.2)
Total Protein: 6.4 g/dL (ref 6.1–8.1)

## 2016-11-01 LAB — LIPASE: Lipase: 20 U/L (ref 7–60)

## 2016-11-01 LAB — AMYLASE: Amylase: 39 U/L (ref 0–105)

## 2016-11-04 ENCOUNTER — Other Ambulatory Visit (INDEPENDENT_AMBULATORY_CARE_PROVIDER_SITE_OTHER): Payer: Medicare Other

## 2016-11-04 ENCOUNTER — Ambulatory Visit: Payer: Self-pay

## 2016-11-04 DIAGNOSIS — R140 Abdominal distension (gaseous): Secondary | ICD-10-CM

## 2016-11-04 DIAGNOSIS — E119 Type 2 diabetes mellitus without complications: Secondary | ICD-10-CM | POA: Diagnosis not present

## 2016-11-04 DIAGNOSIS — R110 Nausea: Secondary | ICD-10-CM

## 2016-11-04 LAB — HEPATITIS C ANTIBODY: HCV Ab: NEGATIVE

## 2016-11-04 LAB — H. PYLORI ANTIBODY, IGG: H Pylori IgG: NEGATIVE

## 2016-11-04 LAB — HEMOGLOBIN A1C: Hgb A1c MFr Bld: 6.4 % (ref 4.6–6.5)

## 2016-11-06 ENCOUNTER — Ambulatory Visit: Payer: Self-pay | Admitting: Internal Medicine

## 2016-11-10 ENCOUNTER — Other Ambulatory Visit: Payer: Self-pay | Admitting: Internal Medicine

## 2016-11-11 ENCOUNTER — Ambulatory Visit (INDEPENDENT_AMBULATORY_CARE_PROVIDER_SITE_OTHER): Payer: Medicare Other | Admitting: *Deleted

## 2016-11-11 ENCOUNTER — Telehealth: Payer: Self-pay | Admitting: *Deleted

## 2016-11-11 DIAGNOSIS — J449 Chronic obstructive pulmonary disease, unspecified: Secondary | ICD-10-CM | POA: Diagnosis not present

## 2016-11-11 DIAGNOSIS — F17200 Nicotine dependence, unspecified, uncomplicated: Secondary | ICD-10-CM

## 2016-11-11 LAB — PULMONARY FUNCTION TEST
DL/VA % pred: 54 %
DL/VA: 2.53 ml/min/mmHg/L
DLCO UNC % PRED: 35 %
DLCO UNC: 8.23 ml/min/mmHg
FEF 25-75 POST: 0.38 L/s
FEF 25-75 PRE: 0.4 L/s
FEF2575-%Change-Post: -3 %
FEF2575-%PRED-POST: 21 %
FEF2575-%Pred-Pre: 22 %
FEV1-%CHANGE-POST: 4 %
FEV1-%PRED-POST: 44 %
FEV1-%PRED-PRE: 42 %
FEV1-POST: 0.95 L
FEV1-Pre: 0.91 L
FEV1FVC-%CHANGE-POST: 1 %
FEV1FVC-%PRED-PRE: 65 %
FEV6-%Change-Post: 4 %
FEV6-%PRED-POST: 69 %
FEV6-%Pred-Pre: 66 %
FEV6-PRE: 1.79 L
FEV6-Post: 1.87 L
FEV6FVC-%CHANGE-POST: 1 %
FEV6FVC-%PRED-PRE: 103 %
FEV6FVC-%Pred-Post: 104 %
FVC-%CHANGE-POST: 2 %
FVC-%PRED-POST: 66 %
FVC-%Pred-Pre: 64 %
FVC-Post: 1.88 L
FVC-Pre: 1.83 L
POST FEV1/FVC RATIO: 51 %
PRE FEV6/FVC RATIO: 98 %
Post FEV6/FVC ratio: 100 %
Pre FEV1/FVC ratio: 50 %

## 2016-11-11 NOTE — Progress Notes (Signed)
PFT performed with ntrogen washout.

## 2016-11-11 NOTE — Telephone Encounter (Signed)
Pt came in today for SMW and PFT. She desat during her walk. She is asking if we can order her a POC since it is hard for her to carry around the small portable tanks even with the bag. She is scheduled to f/u with you on 12/12/16. Thanks

## 2016-11-11 NOTE — Progress Notes (Signed)
SMW performed today. 

## 2016-11-12 NOTE — Telephone Encounter (Signed)
Yes. Please order it   Thanks  Theodoro Gristave

## 2016-11-13 ENCOUNTER — Telehealth: Payer: Self-pay | Admitting: Pulmonary Disease

## 2016-11-13 NOTE — Telephone Encounter (Signed)
Pt is calling regarding a yellow Doran Stabler(manilla) folder she brought with her  Yesterday. She states she cannot find it and wants to know if she left it here. Please call.

## 2016-11-13 NOTE — Telephone Encounter (Signed)
Do you want her on 2L with excertion from the results of the walk. I placed her on 2L of O2 and she came up to 95%. She had dropped to 85% during walk.

## 2016-11-13 NOTE — Telephone Encounter (Signed)
Informed pt I seen her pick up her folder when she left. Nothing further needed.

## 2016-11-13 NOTE — Telephone Encounter (Signed)
Pt calling stating on her MyChart it is showing she needs to do a chest xray  She is just following up on that Please call back

## 2016-11-13 NOTE — Telephone Encounter (Signed)
Spoke with pt and informed her the CXR needs to be done prior to her next appt. Nothing further needed.

## 2016-11-18 ENCOUNTER — Encounter: Payer: Medicare Other | Attending: Pulmonary Disease | Admitting: Respiratory Therapy

## 2016-11-18 VITALS — Ht 65.0 in | Wt 142.7 lb

## 2016-11-18 DIAGNOSIS — J449 Chronic obstructive pulmonary disease, unspecified: Secondary | ICD-10-CM | POA: Insufficient documentation

## 2016-11-18 NOTE — Progress Notes (Signed)
Pulmonary Individual Treatment Plan  Patient Details  Name: Tami Skinner MRN: [id number] Date of Birth: Aug 20, 1946 Referring Provider:   Flowsheet Row Pulmonary Rehab from 11/18/2016 in [facility] Cardiac and Pulmonary Rehab  Referring Provider  Simonds      Initial Encounter Date:  Flowsheet Row Pulmonary Rehab from 11/18/2016 in [facility] Cardiac and Pulmonary Rehab  Date  11/18/16  Referring Provider  Simonds      Visit Diagnosis: Chronic obstructive pulmonary disease, unspecified COPD type (HCC)  Patient's Home Medications on Admission:  Current Outpatient Prescriptions:    ADVAIR DISKUS 250-50 MCG/DOSE AEPB, INHALE ONE PUFF INTO THE LUNGS TWICE DAILY, Disp: 60 each, Rfl: 5   albuterol (PROVENTIL) (2.5 MG/3ML) 0.083% nebulizer solution, Take 3 mLs (2.5 mg total) by nebulization every 6 (six) hours as needed for wheezing or shortness of breath., Disp: 75 mL, Rfl: 12   ALPRAZolam (XANAX) 0.5 MG tablet, TAKE 1 TABLET BY MOUTH EVERY 8 HOURS AS NEEDED, Disp: 90 tablet, Rfl: 0   Cholecalciferol (VITAMIN D-3) 1000 UNITS CAPS, Take 2 capsules by mouth. , Disp: , Rfl:    metFORMIN (GLUCOPHAGE) 500 MG tablet, TAKE 1 TABLET BY MOUTH TWICE A DAY WITH A MEAL, Disp: 180 tablet, Rfl: 3   metoprolol succinate (TOPROL-XL) 25 MG 24 hr tablet, Take 1 tablet (25 mg total) by mouth daily., Disp: 90 tablet, Rfl: 3   ondansetron (ZOFRAN) 4 MG tablet, Take 1 tablet (4 mg total) by mouth every 8 (eight) hours as needed., Disp: 20 tablet, Rfl: 0   ONE TOUCH ULTRA TEST test strip, TEST BLOOD SUGAR AT HOME TWICE A DAY, Disp: 100 each, Rfl: 5   OXYGEN-HELIUM IN, Inhale 2 % into the lungs at bedtime.  , Disp: , Rfl:    rosuvastatin (CRESTOR) 5 MG tablet, Take 1 tablet (5 mg total) by mouth daily., Disp: 90 tablet, Rfl: 3   sertraline (ZOLOFT) 100 MG tablet, TAKE 1 AND 1/2 TABLET BY MOUTH TWICE A DAY, Disp: 270 tablet, Rfl: 0   SPIRIVA HANDIHALER 18 MCG inhalation capsule, INHALE 1 CAPSULE VIA HANDIHALER  DAILY, Disp: 90 capsule, Rfl: 0   valsartan (DIOVAN) 160 MG tablet, Take 1 tablet (160 mg total) by mouth daily., Disp: 90 tablet, Rfl: 3   VENTOLIN HFA 108 (90 Base) MCG/ACT inhaler, INHALE TWO PUFFS INTO THE LUNGS EVERY FOUR HOURS AS NEEDED FOR WHEEZING, Disp: 18 Inhaler, Rfl: 2  Past Medical History: Past Medical History:  Diagnosis Date   Anxiety    Asthmatic bronchitis    COPD (chronic obstructive pulmonary disease) (HCC)    Depression    Diabetes mellitus    Diverticulitis    Diverticulosis    Hyperlipidemia    Hypertension    Osteopenia    Sleep apnea    Stress-induced cardiomyopathy    a. echo 09/28/2014: EF 45-50%, severe HK of mid-distal anterior, apical and mid-distal inferior walls suggestive of stress induced CM, mildly dilated LA, mildly dilated PASP, mild TR, trivial pericardial effusion    Tobacco Use: History  Smoking Status   Former Smoker   Packs/day: 0.25   Years: 40.00   Types: Cigarettes   Quit date: 09/01/2016  Smokeless Tobacco   Never Used    Comment: Uses nicotrol inhaler on occasion    Labs: Recent Review Flowsheet Data    Labs for ITP Cardiac and Pulmonary Rehab Latest Ref Rng & Units 09/28/2014 01/09/2015 10/17/2015 05/20/2016 11/04/2016   Cholestrol 0 - 200 mg/dL 409 811 914 - -  LDLCALC 0 - 99 mg/dL 36 42 66 - -   LDLDIRECT mg/dL - - - - -   HDL >65.78 mg/dL 54 46.96 29.52 - -   Trlycerides 0.0 - 149.0 mg/dL 841 324.0(H) 83.0 - -   Hemoglobin A1c 4.6 - 6.5 % - 6.6(H) 6.7(H) 6.2 6.4       ADL UCSD:     Pulmonary Assessment Scores    Row Name 11/18/16 1531         ADL UCSD   ADL Phase Entry     SOB Score total 76     Rest 1     Walk 3     Stairs 5     Bath 3     Dress 2     Shop 5        Pulmonary Function Assessment:     Pulmonary Function Assessment - 11/18/16 1530      Pulmonary Function Tests   RV% 85 %   DLCO% 35 %     Initial Spirometry Results   FVC% 64 %   FEV1% 42 %   FEV1/FVC Ratio  50   Comments Test date 11/11/16     Post Bronchodilator Spirometry Results   FVC% 66 %   FEV1% 44 %   FEV1/FVC Ratio 51     Breath   Bilateral Breath Sounds Clear;Decreased   Shortness of Breath Yes;Limiting activity;Fear of Shortness of Breath      Exercise Target Goals: Date: 11/18/16  Exercise Program Goal: Individual exercise prescription set with THRR, safety & activity barriers. Participant demonstrates ability to understand and report RPE using BORG scale, to self-measure pulse accurately, and to acknowledge the importance of the exercise prescription.  Exercise Prescription Goal: Starting with aerobic activity 30 plus minutes a day, 3 days per week for initial exercise prescription. Provide home exercise prescription and guidelines that participant acknowledges understanding prior to discharge.  Activity Barriers & Risk Stratification:     Activity Barriers & Cardiac Risk Stratification - 11/18/16 1528      Activity Barriers & Cardiac Risk Stratification   Activity Barriers Arthritis;Shortness of Breath;Deconditioning;Muscular Weakness;History of Falls   Cardiac Risk Stratification Moderate      6 Minute Walk:     6 Minute Walk    Row Name 11/18/16 1552         6 Minute Walk   Phase Initial     Distance 840 feet     Walk Time 6 minutes     # of Rest Breaks 0     MPH 1.6     METS 2.23     RPE 13     Perceived Dyspnea  3     VO2 Peak 7.27     Symptoms No     Resting HR 66 bpm     Resting BP 126/66     Max Ex. HR 90 bpm     Max Ex. BP 122/68       Interval HR   Baseline HR 66     1 Minute HR 78     2 Minute HR 82     3 Minute HR 86     4 Minute HR 90     5 Minute HR 82     2 Minute Post HR 65     Interval Heart Rate? Yes       Interval Oxygen   Interval Oxygen? Yes     Baseline Oxygen Saturation % 96 %  Baseline Liters of Oxygen 2 L     1 Minute Oxygen Saturation % 92 %     1 Minute Liters of Oxygen 2 L     2 Minute Oxygen Saturation %  93 %     2 Minute Liters of Oxygen 2 L     3 Minute Oxygen Saturation % 94 %     3 Minute Liters of Oxygen 2 L     4 Minute Oxygen Saturation % 92 %     4 Minute Liters of Oxygen 2 L     5 Minute Oxygen Saturation % 93 %     5 Minute Liters of Oxygen 2 L     6 Minute Oxygen Saturation % 93 %     6 Minute Liters of Oxygen 2 L     2 Minute Post Oxygen Saturation % 96 %     2 Minute Post Liters of Oxygen 2 L        Initial Exercise Prescription:     Initial Exercise Prescription - 11/18/16 1500      Date of Initial Exercise RX and Referring Provider   Date 11/18/16   Referring Provider Simonds     Oxygen   Oxygen Continuous   Liters 2     Treadmill   MPH 1.4   Grade 0.5   Minutes 15   METs 2.07     NuStep   Level 2   Minutes 15   METs 2     Biostep-RELP   Level 2   Minutes 15   METs 2     Prescription Details   Frequency (times per week) 3   Duration Progress to 45 minutes of aerobic exercise without signs/symptoms of physical distress     Intensity   THRR 40-80% of Max Heartrate 99-132   Ratings of Perceived Exertion 11-13   Perceived Dyspnea 0-4     Progression   Progression Continue to progress workloads to maintain intensity without signs/symptoms of physical distress.     Resistance Training   Training Prescription Yes   Weight 2   Reps 10-15      Perform Capillary Blood Glucose checks as needed.  Exercise Prescription Changes:   Exercise Comments:   Discharge Exercise Prescription (Final Exercise Prescription Changes):    Nutrition:  Target Goals: Understanding of nutrition guidelines, daily intake of sodium 1500mg , cholesterol 200mg , calories 30% from fat and 7% or less from saturated fats, daily to have 5 or more servings of fruits and vegetables.  Biometrics:     Pre Biometrics - 11/18/16 1552      Pre Biometrics   Height 5\' 5"  (1.651 m)   Weight 142 lb 11.2 oz (64.7 kg)   Waist Circumference 34 inches   Hip Circumference  40.5 inches   Waist to Hip Ratio 0.84 %   BMI (Calculated) 23.8       Nutrition Therapy Plan and Nutrition Goals:   Nutrition Discharge: Rate Your Plate Scores:   Psychosocial: Target Goals: Acknowledge presence or absence of depression, maximize coping skills, provide positive support system. Participant is able to verbalize types and ability to use techniques and skills needed for reducing stress and depression.  Initial Review & Psychosocial Screening:     Initial Psych Review & Screening - 11/18/16 1541      Family Dynamics   Comments Ms Kinkade has much stress in her life - her husband is an alcoholic, her daughter died in 03-26-2008, and her  daughter's son has a deformed arm and emotional concerns. Ms Pietila also has issues with COPD and how is has changed her life. She has meet with Dr Laymond Purser a few times. Ms Mcswain is looking forward to [facility] for help managing her COPD.     Barriers   Psychosocial barriers to participate in program Psychosocial barriers identified (see note);The patient should benefit from training in stress management and relaxation.     Screening Interventions   Interventions Program counselor consult;Encouraged to exercise      Quality of Life Scores:     Quality of Life - 11/18/16 1548      Quality of Life Scores   Health/Function Pre 20.5 %   Socioeconomic Pre 20.43 %   Psych/Spiritual Pre 20.86 %   Family Pre 20.6 %   GLOBAL Pre 20.57 %      PHQ-9: Recent Review Flowsheet Data    Depression screen [facility] 2/9 11/18/2016 08/05/2016 05/20/2016 05/20/2016 12/07/2014   Decreased Interest 2 0 0 0 0   Down, Depressed, Hopeless 2 1 0 0 0   PHQ - 2 Score 4 1 0 0 0   Altered sleeping 2 - - - -   Tired, decreased energy 3 - - - -   Change in appetite 0 - - - -   Feeling bad or failure about yourself  0 - - - -   Trouble concentrating 1 - - - -   Moving slowly or fidgety/restless 0 - - - -   Suicidal thoughts 0 - - - -   PHQ-9 Score 10 - - - -    Difficult doing work/chores Somewhat difficult - - - -      Psychosocial Evaluation and Intervention:   Psychosocial Re-Evaluation:  Education: Education Goals: Education classes will be provided on a weekly basis, covering required topics. Participant will state understanding/return demonstration of topics presented.  Learning Barriers/Preferences:     Learning Barriers/Preferences - 11/18/16 1528      Learning Barriers/Preferences   Learning Barriers None   Learning Preferences None      Education Topics: Initial Evaluation Education: - Verbal, written and demonstration of respiratory meds, RPE/PD scales, oximetry and breathing techniques. Instruction on use of nebulizers and MDIs: cleaning and proper use, rinsing mouth with steroid doses and importance of monitoring MDI activations. Flowsheet Row Pulmonary Rehab from 11/18/2016 in [facility] Cardiac and Pulmonary Rehab  Date  11/18/16  Educator  LB  Instruction Review Code  2- meets goals/outcomes      General Nutrition Guidelines/Fats and Fiber: -Group instruction provided by verbal, written material, models and posters to present the general guidelines for heart healthy nutrition. Gives an explanation and review of dietary fats and fiber.   Controlling Sodium/Reading Food Labels: -Group verbal and written material supporting the discussion of sodium use in heart healthy nutrition. Review and explanation with models, verbal and written materials for utilization of the food label.   Exercise Physiology & Risk Factors: - Group verbal and written instruction with models to review the exercise physiology of the cardiovascular system and associated critical values. Details cardiovascular disease risk factors and the goals associated with each risk factor.   Aerobic Exercise & Resistance Training: - Gives group verbal and written discussion on the health impact of inactivity. On the components of aerobic and resistive training  programs and the benefits of this training and how to safely progress through these programs.   Flexibility, Balance, General Exercise Guidelines: - Provides group verbal  and written instruction on the benefits of flexibility and balance training programs. Provides general exercise guidelines with specific guidelines to those with heart or lung disease. Demonstration and skill practice provided.   Stress Management: - Provides group verbal and written instruction about the health risks of elevated stress, cause of high stress, and healthy ways to reduce stress.   Depression: - Provides group verbal and written instruction on the correlation between heart/lung disease and depressed mood, treatment options, and the stigmas associated with seeking treatment.   Exercise & Equipment Safety: - Individual verbal instruction and demonstration of equipment use and safety with use of the equipment.   Infection Prevention: - Provides verbal and written material to individual with discussion of infection control including proper hand washing and proper equipment cleaning during exercise session. Flowsheet Row Pulmonary Rehab from 11/18/2016 in [facility] Cardiac and Pulmonary Rehab  Date  11/18/16  Educator  LB  Instruction Review Code  2- meets goals/outcomes      Falls Prevention: - Provides verbal and written material to individual with discussion of falls prevention and safety. Flowsheet Row Pulmonary Rehab from 11/18/2016 in [facility] Cardiac and Pulmonary Rehab  Date  11/18/16  Educator  LB  Instruction Review Code  2- meets goals/outcomes      Diabetes: - Individual verbal and written instruction to review signs/symptoms of diabetes, desired ranges of glucose level fasting, after meals and with exercise. Advice that pre and post exercise glucose checks will be done for 3 sessions at entry of program.   Chronic Lung Diseases: - Group verbal and written instruction to review new updates, new  respiratory medications, new advancements in procedures and treatments. Provide informative websites and "800" numbers of self-education.   Lung Procedures: - Group verbal and written instruction to describe testing methods done to diagnose lung disease. Review the outcome of test results. Describe the treatment choices: Pulmonary Function Tests, ABGs and oximetry.   Energy Conservation: - Provide group verbal and written instruction for methods to conserve energy, plan and organize activities. Instruct on pacing techniques, use of adaptive equipment and posture/positioning to relieve shortness of breath.   Triggers: - Group verbal and written instruction to review types of environmental controls: home humidity, furnaces, filters, dust mite/pet prevention, HEPA vacuums. To discuss weather changes, air quality and the benefits of nasal washing.   Exacerbations: - Group verbal and written instruction to provide: warning signs, infection symptoms, calling MD promptly, preventive modes, and value of vaccinations. Review: effective airway clearance, coughing and/or vibration techniques. Create an Sport and exercise psychologist.   Oxygen: - Individual and group verbal and written instruction on oxygen therapy. Includes supplement oxygen, available portable oxygen systems, continuous and intermittent flow rates, oxygen safety, concentrators, and Medicare reimbursement for oxygen. Flowsheet Row Pulmonary Rehab from 11/18/2016 in [facility] Cardiac and Pulmonary Rehab  Date  11/18/16  Educator  LB  Instruction Review Code  2- meets goals/outcomes      Respiratory Medications: - Group verbal and written instruction to review medications for lung disease. Drug class, frequency, complications, importance of spacers, rinsing mouth after steroid MDI's, and proper cleaning methods for nebulizers. Flowsheet Row Pulmonary Rehab from 11/18/2016 in [facility] Cardiac and Pulmonary Rehab  Date  11/18/16  Educator  LB  Instruction  Review Code  2- meets goals/outcomes      AED/CPR: - Group verbal and written instruction with the use of models to demonstrate the basic use of the AED with the basic ABC's of resuscitation.   Breathing Retraining: -  Provides individuals verbal and written instruction on purpose, frequency, and proper technique of diaphragmatic breathing and pursed-lipped breathing. Applies individual practice skills. Flowsheet Row [facility]monary [facility] Pul[facility]  Date  11/18/16  Educator  LB  Instruction Review Code  2- meets goals/outcomes      Anatomy and Physiology of the Lungs: - Group verbal and written instruction with the use of models to provide basic lung anatomy and physiology related to function, structure and complications of lung disease.   Heart Failure: - Group verbal and written instruction on the basics of heart failure: signs/symptoms, treatments, explanation of ejection fraction, enlarged heart and cardiomyopathy.   Sleep Apnea: - Individual verbal and written instruction to review Obstructive Sleep Apnea. Review of risk factors, methods for diagnosing and types of masks and machines for OSA. Flowsheet Row [facility]monary Rehab [facility]18[facility] Pul[facility] Re[facility]  Date  11/18/16  Educator  LB  Instruction Review Code  2- meets goals/outcomes      Anxiety: - Provides group, verbal and written instruction on the correlation between heart/lung disease and anxiety, treatment options, and management of anxiety.   Relaxation: - Provides group, verbal and written instruction about the benefits of relaxation for patients with heart/lung disease. Also provides patients with examples of relaxation techniques.   Knowledge Questionnaire Score:     Knowledge Questionnaire Score - 11/18/16 1528      Knowledge Questionnaire Score   Pre Score 8/10       Core Components/Risk Factors/Patient Goals at Admission:     Personal Goals  and Risk Factors at Admission - 11/18/16 1538      Core Components/Risk Factors/Patient Goals on Admission   Sedentary Yes   Intervention Provide advice, education, support and counseling about physical activity/exercise needs.;Develop an individualized exercise prescription for aerobic and resistive training based on initial evaluation findings, risk stratification, comorbidities and participant's personal goals.   Expected Outcomes Achievement of increased cardiorespiratory fitness and enhanced flexibility, muscular endurance and strength shown through measurements of functional capacity and personal statement of participant.   Increase Strength and Stamina Yes   Intervention Provide advice, education, support and counseling about physical activity/exercise needs.;Develop an individualized exercise prescription for aerobic and resistive training based on initial evaluation findings, risk stratification, comorbidities and participant's personal goals.   Expected Outcomes Achievement of increased cardiorespiratory fitness and enhanced flexibility, muscular endurance and strength shown through measurements of functional capacity and personal statement of participant.   Improve shortness of breath with ADL's Yes   Intervention Provide education, individualized exercise plan and daily activity instruction to help decrease symptoms of SOB with activities of daily living.   Expected Outcomes Short Term: Achieves a reduction of symptoms when performing activities of daily living.   Develop more efficient breathing techniques such as purse lipped breathing and diaphragmatic breathing; and practicing self-pacing with activity Yes   Intervention Provide education, demonstration and support about specific breathing techniuqes utilized for more efficient breathing. Include techniques such as pursed lipped breathing, diaphragmatic breathing and self-pacing activity.   Expected Outcomes Short Term: Participant will  be able to demonstrate and use breathing techniques as needed throughout daily activities.   Increase knowledge of respiratory medications and ability to use respiratory devices properly  Yes  Spiriva, Advair, Albuterol, Spacer given; SVN Albuterol   Intervention Provide education and demonstration as needed of appropriate use of medications, inhalers, and oxygen therapy.   Expected Outcomes Short Term: Achieves understanding of medications use.  Understands that oxygen is a medication prescribed by physician. Demonstrates appropriate use of inhaler and oxygen therapy.   Diabetes Yes   Intervention Provide education about signs/symptoms and action to take for hypo/hyperglycemia.;Provide education about proper nutrition, including hydration, and aerobic/resistive exercise prescription along with prescribed medications to achieve blood glucose in normal ranges: Fasting glucose 65-99 mg/dL   Expected Outcomes Short Term: Participant verbalizes understanding of the signs/symptoms and immediate care of hyper/hypoglycemia, proper foot care and importance of medication, aerobic/resistive exercise and nutrition plan for blood glucose control.;Long Term: Attainment of HbA1C < 7%.   Heart Failure Yes   Intervention Provide a combined exercise and nutrition program that is supplemented with education, support and counseling about heart failure. Directed toward relieving symptoms such as shortness of breath, decreased exercise tolerance, and extremity edema.   Expected Outcomes Improve functional capacity of life   Hypertension Yes   Intervention Provide education on lifestyle modifcations including regular physical activity/exercise, weight management, moderate sodium restriction and increased consumption of fresh fruit, vegetables, and low fat dairy, alcohol moderation, and smoking cessation.;Monitor prescription use compliance.   Expected Outcomes Short Term: Continued assessment and intervention until BP is <  140/18mm HG in hypertensive participants. < 130/40mm HG in hypertensive participants with diabetes, heart failure or chronic kidney disease.;Long Term: Maintenance of blood pressure at goal levels.   Lipids Yes   Intervention Provide education and support for participant on nutrition & aerobic/resistive exercise along with prescribed medications to achieve LDL 70mg , HDL >40mg .   Expected Outcomes Short Term: Participant states understanding of desired cholesterol values and is compliant with medications prescribed. Participant is following exercise prescription and nutrition guidelines.;Long Term: Cholesterol controlled with medications as prescribed, with individualized exercise RX and with personalized nutrition plan. Value goals: LDL < 70mg , HDL > 40 mg.      Core Components/Risk Factors/Patient Goals Review:    Core Components/Risk Factors/Patient Goals at Discharge (Final Review):    ITP Comments:   Comments: Ms Goar plans to start [facility] on 11/26/16 and will attend 3 days/week.

## 2016-11-18 NOTE — Patient Instructions (Signed)
Patient Instructions  Patient Details  Name: Tami Skinner MRN: [id number] Date of Birth: 12-27-1945 Referring Provider:  Merwyn Katos, MD  Below are the personal goals you chose as well as exercise and nutrition goals. Our goal is to help you keep on track towards obtaining and maintaining your goals. We will be discussing your progress on these goals with you throughout the program.  Initial Exercise Prescription:     Initial Exercise Prescription - 11/18/16 1500      Date of Initial Exercise RX and Referring Provider   Date 11/18/16   Referring Provider Simonds     Oxygen   Oxygen Continuous   Liters 2     Treadmill   MPH 1.4   Grade 0.5   Minutes 15   METs 2.07     NuStep   Level 2   Minutes 15   METs 2     Biostep-RELP   Level 2   Minutes 15   METs 2     Prescription Details   Frequency (times per week) 3   Duration Progress to 45 minutes of aerobic exercise without signs/symptoms of physical distress     Intensity   THRR 40-80% of Max Heartrate 99-132   Ratings of Perceived Exertion 11-13   Perceived Dyspnea 0-4     Progression   Progression Continue to progress workloads to maintain intensity without signs/symptoms of physical distress.     Resistance Training   Training Prescription Yes   Weight 2   Reps 10-15      Exercise Goals: Frequency: Be able to perform aerobic exercise three times per week working toward 3-5 days per week.  Intensity: Work with a perceived exertion of 11 (fairly light) - 15 (hard) as tolerated. Follow your new exercise prescription and watch for changes in prescription as you progress with the program. Changes will be reviewed with you when they are made.  Duration: You should be able to do 30 minutes of continuous aerobic exercise in addition to a 5 minute warm-up and a 5 minute cool-down routine.  Nutrition Goals: Your personal nutrition goals will be established when you do your nutrition analysis with the  dietician.  The following are nutrition guidelines to follow: Cholesterol < 200mg /day Sodium < 1500mg /day Fiber: Women over 50 yrs - 21 grams per day  Personal Goals:     Personal Goals and Risk Factors at Admission - 11/18/16 1538      Core Components/Risk Factors/Patient Goals on Admission   Sedentary Yes   Intervention Provide advice, education, support and counseling about physical activity/exercise needs.;Develop an individualized exercise prescription for aerobic and resistive training based on initial evaluation findings, risk stratification, comorbidities and participant's personal goals.   Expected Outcomes Achievement of increased cardiorespiratory fitness and enhanced flexibility, muscular endurance and strength shown through measurements of functional capacity and personal statement of participant.   Increase Strength and Stamina Yes   Intervention Provide advice, education, support and counseling about physical activity/exercise needs.;Develop an individualized exercise prescription for aerobic and resistive training based on initial evaluation findings, risk stratification, comorbidities and participant's personal goals.   Expected Outcomes Achievement of increased cardiorespiratory fitness and enhanced flexibility, muscular endurance and strength shown through measurements of functional capacity and personal statement of participant.   Improve shortness of breath with ADL's Yes   Intervention Provide education, individualized exercise plan and daily activity instruction to help decrease symptoms of SOB with activities of daily living.   Expected Outcomes Short Term:  Achieves a reduction of symptoms when performing activities of daily living.   Develop more efficient breathing techniques such as purse lipped breathing and diaphragmatic breathing; and practicing self-pacing with activity Yes   Intervention Provide education, demonstration and support about specific breathing  techniuqes utilized for more efficient breathing. Include techniques such as pursed lipped breathing, diaphragmatic breathing and self-pacing activity.   Expected Outcomes Short Term: Participant will be able to demonstrate and use breathing techniques as needed throughout daily activities.   Increase knowledge of respiratory medications and ability to use respiratory devices properly  Yes  Spiriva, Advair, Albuterol, Spacer given; SVN Albuterol   Intervention Provide education and demonstration as needed of appropriate use of medications, inhalers, and oxygen therapy.   Expected Outcomes Short Term: Achieves understanding of medications use. Understands that oxygen is a medication prescribed by physician. Demonstrates appropriate use of inhaler and oxygen therapy.   Diabetes Yes   Intervention Provide education about signs/symptoms and action to take for hypo/hyperglycemia.;Provide education about proper nutrition, including hydration, and aerobic/resistive exercise prescription along with prescribed medications to achieve blood glucose in normal ranges: Fasting glucose 65-99 mg/dL   Expected Outcomes Short Term: Participant verbalizes understanding of the signs/symptoms and immediate care of hyper/hypoglycemia, proper foot care and importance of medication, aerobic/resistive exercise and nutrition plan for blood glucose control.;Long Term: Attainment of HbA1C < 7%.   Heart Failure Yes   Intervention Provide a combined exercise and nutrition program that is supplemented with education, support and counseling about heart failure. Directed toward relieving symptoms such as shortness of breath, decreased exercise tolerance, and extremity edema.   Expected Outcomes Improve functional capacity of life   Hypertension Yes   Intervention Provide education on lifestyle modifcations including regular physical activity/exercise, weight management, moderate sodium restriction and increased consumption of fresh  fruit, vegetables, and low fat dairy, alcohol moderation, and smoking cessation.;Monitor prescription use compliance.   Expected Outcomes Short Term: Continued assessment and intervention until BP is < 140/1490mm HG in hypertensive participants. < 130/4480mm HG in hypertensive participants with diabetes, heart failure or chronic kidney disease.;Long Term: Maintenance of blood pressure at goal levels.   Lipids Yes   Intervention Provide education and support for participant on nutrition & aerobic/resistive exercise along with prescribed medications to achieve LDL 70mg , HDL >40mg .   Expected Outcomes Short Term: Participant states understanding of desired cholesterol values and is compliant with medications prescribed. Participant is following exercise prescription and nutrition guidelines.;Long Term: Cholesterol controlled with medications as prescribed, with individualized exercise RX and with personalized nutrition plan. Value goals: LDL < 70mg , HDL > 40 mg.      Tobacco Use Initial Evaluation: History  Smoking Status   Former Smoker   Packs/day: 0.25   Years: 40.00   Types: Cigarettes   Quit date: 09/01/2016  Smokeless Tobacco   Never Used    Comment: Uses nicotrol inhaler on occasion    Copy of goals given to participant.

## 2016-11-26 ENCOUNTER — Encounter: Payer: Medicare Other | Admitting: Respiratory Therapy

## 2016-11-26 DIAGNOSIS — J449 Chronic obstructive pulmonary disease, unspecified: Secondary | ICD-10-CM

## 2016-11-26 LAB — GLUCOSE, CAPILLARY
Glucose-Capillary: 122 mg/dL — ABNORMAL HIGH (ref 65–99)
Glucose-Capillary: 119 mg/dL — ABNORMAL HIGH (ref 65–99)

## 2016-11-26 NOTE — Progress Notes (Signed)
Daily Session Note  Patient Details  Name: Tami Skinner MRN: [id number] Date of Birth: 03/26/1946 Referring Provider:   April Manson Pulmonary Rehab from 11/18/2016 in [facility] Card[facility] Pul[facility] Re[facility]  Referring Provider  Simonds      Encounter Date: 11/26/2016  Check In:     Session Check In - 11/26/16 1304      Check-In   Location [facility]-Cardiac & Pulmonary Rehab   Staff Present Carson Myrtle, BS, RRT, Respiratory Lennie Hummer, MA, ACSM RCEP, Exercise Physiologist;Tacey Dimaggio RN BSN   Supervising physician immediately available to respond to emergencies [facility] immediately available ER MD   Physician(s) Drs. Malinda and Lord   Medication changes reported     No   Fall or balance concerns reported    No   Warm-up and Cool-down Performed as group-led Location manager Performed Yes   VAD Patient? No     Pain Assessment   Currently in Pain? No/denies   Multiple Pain Sites No         Goals Met:  Proper associated with RPD/PD & O2 Sat Exercise tolerated well No report of cardiac concerns or symptoms Strength training completed today  Goals Unmet:  Not Applicable  Comments: First full day of exercise!  Patient was oriented to gym and equipment including functions, settings, policies, and procedures.  Patient's individual exercise prescription and treatment plan were reviewed.  All starting workloads were established based on the results of the 6 minute walk test done at initial orientation visit.  The plan for exercise progression was also introduced and progression will be customized based on patient's performance and goals.    Dr. Emily Filbert is Medical Director for [place] and [facility].

## 2016-11-27 ENCOUNTER — Ambulatory Visit: Payer: Self-pay | Admitting: Pulmonary Disease

## 2016-11-28 ENCOUNTER — Ambulatory Visit
Admission: RE | Admit: 2016-11-28 | Discharge: 2016-11-28 | Disposition: A | Discharge status: Home / Self Care | Payer: Medicare Other | Source: Ambulatory Visit | Attending: Pulmonary Disease | Admitting: Pulmonary Disease

## 2016-11-28 ENCOUNTER — Telehealth: Payer: Self-pay | Admitting: Pulmonary Disease

## 2016-11-28 ENCOUNTER — Encounter: Payer: Medicare Other | Admitting: *Deleted

## 2016-11-28 DIAGNOSIS — F17200 Nicotine dependence, unspecified, uncomplicated: Secondary | ICD-10-CM | POA: Insufficient documentation

## 2016-11-28 DIAGNOSIS — J449 Chronic obstructive pulmonary disease, unspecified: Secondary | ICD-10-CM

## 2016-11-28 LAB — GLUCOSE, CAPILLARY
GLUCOSE-CAPILLARY: 99 mg/dL (ref 65–99)
GLUCOSE-CAPILLARY: 111 mg/dL — AB (ref 65–99)

## 2016-11-28 NOTE — Progress Notes (Signed)
Daily Session Note  Patient Details  Name: Tami Skinner MRN: [id number] Date of Birth: 10-May-1946 Referring Provider:   Flowsheet Row Pulmonary Rehab from 11/18/2016 in [facility] Cardiac and Pulmonary Rehab  Referring Provider  Simonds      Encounter Date: 11/28/2016  Check In:     Session Check In - 11/28/16 1216      Check-In   Location [facility]-Cardiac & Pulmonary Rehab   Staff Present Gerlene Burdock, RN, Levie Heritage, MA, ACSM RCEP, Exercise Physiologist;Patricia Surles RN BSN   Supervising physician immediately available to respond to emergencies [facility] immediately available ER MD   Physician(s) Dr. Kerman Passey and Dr. Quentin Cornwall   Medication changes reported     No   Fall or balance concerns reported    No   Warm-up and Cool-down Performed on first and last piece of equipment   Resistance Training Performed Yes   VAD Patient? No     Pain Assessment   Currently in Pain? No/denies         Goals Met:  Proper associated with RPD/PD & O2 Sat Exercise tolerated well  Goals Unmet:  Not Applicable  Comments:     Dr. Emily Filbert is Medical Director for [place] and [facility].

## 2016-11-28 NOTE — Telephone Encounter (Signed)
Pt informed order sent. Nothing further needed.

## 2016-11-28 NOTE — Telephone Encounter (Signed)
Pt needs discuss oxygen that was to be called in to [facility].

## 2016-12-01 ENCOUNTER — Encounter: Payer: Self-pay | Admitting: Internal Medicine

## 2016-12-01 MED ORDER — ROSUVASTATIN CALCIUM 5 MG PO TABS: 5.0000 mg | ORAL_TABLET | Freq: Every day | ORAL | 0 refills | Status: DC

## 2016-12-03 ENCOUNTER — Encounter: Payer: Self-pay | Admitting: *Deleted

## 2016-12-03 ENCOUNTER — Encounter: Payer: Medicare Other | Admitting: *Deleted

## 2016-12-03 DIAGNOSIS — J449 Chronic obstructive pulmonary disease, unspecified: Secondary | ICD-10-CM

## 2016-12-03 LAB — GLUCOSE, CAPILLARY: GLUCOSE-CAPILLARY: 93 mg/dL (ref 65–99)

## 2016-12-03 NOTE — Progress Notes (Signed)
Daily Session Note  Patient Details  Name: Tami Skinner MRN: [id number] Date of Birth: 01/25/1946 Referring Provider:   April Manson Pulmonary Rehab from 11/18/2016 in [facility] Cardiac and Pulmonary Rehab  Referring Provider  Simonds      Encounter Date: 12/03/2016  Check In:     Session Check In - 12/03/16 1217      Check-In   Location [facility]-Cardiac & Pulmonary Rehab   Staff Present Carson Myrtle, BS, RRT, Respiratory Lennie Hummer, MA, ACSM RCEP, Exercise Physiologist;Davion Flannery RN BSN   Supervising physician immediately available to respond to emergencies [facility] immediately available ER MD   Physician(s) Drs. Malinda and Williams   Medication changes reported     No   Fall or balance concerns reported    No   Warm-up and Cool-down Performed as group-led Location manager Performed Yes   VAD Patient? No     Pain Assessment   Currently in Pain? No/denies   Multiple Pain Sites No         Goals Met:  Proper associated with RPD/PD & O2 Sat [place] with exercise equipment Using PLB without cueing & demonstrates good technique Exercise tolerated well Strength training completed today  Goals Unmet:  Not Applicable  Comments: Pt able to follow exercise prescription today without complaint.  Will continue to monitor for progression.    Dr. Emily Filbert is Medical Director for [place] and [facility].

## 2016-12-03 NOTE — Progress Notes (Signed)
Pulmonary Individual Treatment Plan  Patient Details  Name: Tami Skinner MRN: [id number] Date of Birth: [birth year] Referring Provider:   Flowsheet Row Pulmonary Rehab from 11/18/2016 in [facility] Cardiac and Pulmonary Rehab  Referring Provider  Simonds      Initial Encounter Date:  Flowsheet Row Pulmonary Rehab from 11/18/2016 in [facility] Cardiac and Pulmonary Rehab  Date  11/18/16  Referring Provider  Simonds      Visit Diagnosis: Chronic obstructive pulmonary disease, unspecified COPD type (HCC)  Patient's Home Medications on Admission:  Current Outpatient Prescriptions:  .  ADVAIR DISKUS 250-50 MCG/DOSE AEPB, INHALE ONE PUFF INTO THE LUNGS TWICE DAILY, Disp: 60 each, Rfl: 5 .  albuterol (PROVENTIL) (2.5 MG/3ML) 0.083% nebulizer solution, Take 3 mLs (2.5 mg total) by nebulization every 6 (six) hours as needed for wheezing or shortness of breath., Disp: 75 mL, Rfl: 12 .  ALPRAZolam (XANAX) 0.5 MG tablet, TAKE 1 TABLET BY MOUTH EVERY 8 HOURS AS NEEDED, Disp: 90 tablet, Rfl: 0 .  Cholecalciferol (VITAMIN D-3) 1000 UNITS CAPS, Take 2 capsules by mouth. , Disp: , Rfl:  .  metFORMIN (GLUCOPHAGE) 500 MG tablet, TAKE 1 TABLET BY MOUTH TWICE A DAY WITH A MEAL, Disp: 180 tablet, Rfl: 3 .  metoprolol succinate (TOPROL-XL) 25 MG 24 hr tablet, Take 1 tablet (25 mg total) by mouth daily., Disp: 90 tablet, Rfl: 3 .  ondansetron (ZOFRAN) 4 MG tablet, Take 1 tablet (4 mg total) by mouth every 8 (eight) hours as needed., Disp: 20 tablet, Rfl: 0 .  ONE TOUCH ULTRA TEST test strip, TEST BLOOD SUGAR AT HOME TWICE A DAY, Disp: 100 each, Rfl: 5 .  OXYGEN-HELIUM IN, Inhale 2 % into the lungs at bedtime.  , Disp: , Rfl:  .  rosuvastatin (CRESTOR) 5 MG tablet, Take 1 tablet (5 mg total) by mouth daily., Disp: 90 tablet, Rfl: 0 .  sertraline (ZOLOFT) 100 MG tablet, TAKE 1 AND 1/2 TABLET BY MOUTH TWICE A DAY, Disp: 270 tablet, Rfl: 0 .  SPIRIVA HANDIHALER 18 MCG inhalation capsule, INHALE 1 CAPSULE VIA HANDIHALER  DAILY, Disp: 90 capsule, Rfl: 0 .  valsartan (DIOVAN) 160 MG tablet, Take 1 tablet (160 mg total) by mouth daily., Disp: 90 tablet, Rfl: 3 .  VENTOLIN HFA 108 (90 Base) MCG/ACT inhaler, INHALE TWO PUFFS INTO THE LUNGS EVERY FOUR HOURS AS NEEDED FOR WHEEZING, Disp: 18 Inhaler, Rfl: 2  Past Medical History: Past Medical History:  Diagnosis Date  . Anxiety   . Asthmatic bronchitis   . COPD (chronic obstructive pulmonary disease) (HCC)   . Depression   . Diabetes mellitus   . Diverticulitis   . Diverticulosis   . Hyperlipidemia   . Hypertension   . Osteopenia   . Sleep apnea   . Stress-induced cardiomyopathy    a. echo 09/28/2014: EF 45-50%, severe HK of mid-distal anterior, apical and mid-distal inferior walls suggestive of stress induced CM, mildly dilated LA, mildly dilated PASP, mild TR, trivial pericardial effusion    Tobacco Use: History  Smoking Status  . Former Smoker  . Packs/day: 0.25  . Years: 40.00  . Types: Cigarettes  . Quit date: 09/01/2016  Smokeless Tobacco  . Never Used    Comment: Uses nicotrol inhaler on occasion    Labs: Recent Review Flowsheet Data    Labs for ITP Cardiac and Pulmonary Rehab Latest Ref Rng & Units 09/28/2014 01/09/2015 10/17/2015 05/20/2016 11/04/2016   Cholestrol 0 - 200 mg/dL 409 811 914 - -  LDLCALC 0 - 99 mg/dL 36 42 66 - -   LDLDIRECT mg/dL - - - - -   HDL >81.19 mg/dL 54 14.78 29.56 - -   Trlycerides 0.0 - 149.0 mg/dL 213 086.0(H) 83.0 - -   Hemoglobin A1c 4.6 - 6.5 % - 6.6(H) 6.7(H) 6.2 6.4       ADL UCSD:     Pulmonary Assessment Scores    Row Name 11/18/16 1531         ADL UCSD   ADL Phase Entry     SOB Score total 76     Rest 1     Walk 3     Stairs 5     Bath 3     Dress 2     Shop 5        Pulmonary Function Assessment:     Pulmonary Function Assessment - 11/18/16 1530      Pulmonary Function Tests   RV% 85 %   DLCO% 35 %     Initial Spirometry Results   FVC% 64 %   FEV1% 42 %   FEV1/FVC Ratio  50   Comments Test date 11/11/16     Post Bronchodilator Spirometry Results   FVC% 66 %   FEV1% 44 %   FEV1/FVC Ratio 51     Breath   Bilateral Breath Sounds Clear;Decreased   Shortness of Breath Yes;Limiting activity;Fear of Shortness of Breath      Exercise Target Goals:    Exercise Program Goal: Individual exercise prescription set with THRR, safety & activity barriers. Participant demonstrates ability to understand and report RPE using BORG scale, to self-measure pulse accurately, and to acknowledge the importance of the exercise prescription.  Exercise Prescription Goal: Starting with aerobic activity 30 plus minutes a day, 3 days per week for initial exercise prescription. Provide home exercise prescription and guidelines that participant acknowledges understanding prior to discharge.  Activity Barriers & Risk Stratification:     Activity Barriers & Cardiac Risk Stratification - 11/18/16 1528      Activity Barriers & Cardiac Risk Stratification   Activity Barriers Arthritis;Shortness of Breath;Deconditioning;Muscular Weakness;History of Falls   Cardiac Risk Stratification Moderate      6 Minute Walk:     6 Minute Walk    Row Name 11/18/16 1552         6 Minute Walk   Phase Initial     Distance 840 feet     Walk Time 6 minutes     # of Rest Breaks 0     MPH 1.6     METS 2.23     RPE 13     Perceived Dyspnea  3     VO2 Peak 7.27     Symptoms No     Resting HR 66 bpm     Resting BP 126/66     Max Ex. HR 90 bpm     Max Ex. BP 122/68       Interval HR   Baseline HR 66     1 Minute HR 78     2 Minute HR 82     3 Minute HR 86     4 Minute HR 90     5 Minute HR 82     2 Minute Post HR 65     Interval Heart Rate? Yes       Interval Oxygen   Interval Oxygen? Yes     Baseline Oxygen Saturation % 96 %  Baseline Liters of Oxygen 2 L     1 Minute Oxygen Saturation % 92 %     1 Minute Liters of Oxygen 2 L     2 Minute Oxygen Saturation % 93 %     2  Minute Liters of Oxygen 2 L     3 Minute Oxygen Saturation % 94 %     3 Minute Liters of Oxygen 2 L     4 Minute Oxygen Saturation % 92 %     4 Minute Liters of Oxygen 2 L     5 Minute Oxygen Saturation % 93 %     5 Minute Liters of Oxygen 2 L     6 Minute Oxygen Saturation % 93 %     6 Minute Liters of Oxygen 2 L     2 Minute Post Oxygen Saturation % 96 %     2 Minute Post Liters of Oxygen 2 L        Initial Exercise Prescription:     Initial Exercise Prescription - 11/18/16 1500      Date of Initial Exercise RX and Referring Provider   Date 11/18/16   Referring Provider Simonds     Oxygen   Oxygen Continuous   Liters 2     Treadmill   MPH 1.4   Grade 0.5   Minutes 15   METs 2.07     NuStep   Level 2   Minutes 15   METs 2     Biostep-RELP   Level 2   Minutes 15   METs 2     Prescription Details   Frequency (times per week) 3   Duration Progress to 45 minutes of aerobic exercise without signs/symptoms of physical distress     Intensity   THRR 40-80% of Max Heartrate 99-132   Ratings of Perceived Exertion 11-13   Perceived Dyspnea 0-4     Progression   Progression Continue to progress workloads to maintain intensity without signs/symptoms of physical distress.     Resistance Training   Training Prescription Yes   Weight 2   Reps 10-15      Perform Capillary Blood Glucose checks as needed.  Exercise Prescription Changes:     Exercise Prescription Changes    Row Name 11/26/16 1400 12/03/16 1400           Exercise Review   Progression -  First Full Day of Exercise Yes        Response to Exercise   Blood Pressure (Admit) 120/64 126/70      Blood Pressure (Exercise) 100/60 128/70      Blood Pressure (Exit) 84/52  water recheck 112/56 seated and 96/56, 98/58 standing 108/60      Heart Rate (Admit) 76 bpm 77 bpm      Heart Rate (Exercise) 78 bpm 80 bpm      Heart Rate (Exit) 70 bpm 71 bpm      Oxygen Saturation (Admit) 91 % 95 %       Oxygen Saturation (Exercise) 93 % 94 %      Oxygen Saturation (Exit) 92 % 95 %      Rating of Perceived Exertion (Exercise) 13 13      Perceived Dyspnea (Exercise) 3 2      Symptoms dizziness at the end none      Duration Progress to 45 minutes of aerobic exercise without signs/symptoms of physical distress Progress to 45 minutes of aerobic exercise without signs/symptoms of  physical distress      Intensity THRR unchanged THRR unchanged        Progression   Progression Continue to progress workloads to maintain intensity without signs/symptoms of physical distress. Continue to progress workloads to maintain intensity without signs/symptoms of physical distress.      Average METs 1.8 1.97        Resistance Training   Training Prescription Yes Yes      Weight Greaen Band Yellow Band      Reps 10-15 10-15        Interval Training   Interval Training  - No        Oxygen   Oxygen Continuous Continuous      Liters 2 2        Treadmill   MPH 1 1      Grade 0.5 0.5      Minutes 15 15      METs 1.83 1.83        NuStep   Level 2 2      Minutes 15 15      METs 1.8 2.1        Biostep-RELP   Level  - 1      Minutes  - 15      METs  - 2         Exercise Comments:     Exercise Comments    Row Name 11/26/16 1443 12/03/16 1359         Exercise Comments First full day of exercise!  Patient was oriented to gym and equipment including functions, settings, policies, and procedures.  Patient's individual exercise prescription and treatment plan were reviewed.  All starting workloads were established based on the results of the 6 minute walk test done at initial orientation visit.  The plan for exercise progression was also introduced and progression will be customized based on patient's performance and goals. Shyrl is off to a good start in exercise.  She has gotten three full days of exercise.  Sela is doing well on all of her equipment, we will continue to monitor her progression.          Discharge Exercise Prescription (Final Exercise Prescription Changes):     Exercise Prescription Changes - 12/03/16 1400      Exercise Review   Progression Yes     Response to Exercise   Blood Pressure (Admit) 126/70   Blood Pressure (Exercise) 128/70   Blood Pressure (Exit) 108/60   Heart Rate (Admit) 77 bpm   Heart Rate (Exercise) 80 bpm   Heart Rate (Exit) 71 bpm   Oxygen Saturation (Admit) 95 %   Oxygen Saturation (Exercise) 94 %   Oxygen Saturation (Exit) 95 %   Rating of Perceived Exertion (Exercise) 13   Perceived Dyspnea (Exercise) 2   Symptoms none   Duration Progress to 45 minutes of aerobic exercise without signs/symptoms of physical distress   Intensity THRR unchanged     Progression   Progression Continue to progress workloads to maintain intensity without signs/symptoms of physical distress.   Average METs 1.97     Resistance Training   Training Prescription Yes   Weight Yellow Band   Reps 10-15     Interval Training   Interval Training No     Oxygen   Oxygen Continuous   Liters 2     Treadmill   MPH 1   Grade 0.5   Minutes 15   METs 1.83  NuStep   Level 2   Minutes 15   METs 2.1     Biostep-RELP   Level 1   Minutes 15   METs 2       Nutrition:  Target Goals: Understanding of nutrition guidelines, daily intake of sodium 1500mg , cholesterol 200mg , calories 30% from fat and 7% or less from saturated fats, daily to have 5 or more servings of fruits and vegetables.  Biometrics:     Pre Biometrics - 11/18/16 1552      Pre Biometrics   Height 5\' 5"  (1.651 m)   Weight 142 lb 11.2 oz (64.7 kg)   Waist Circumference 34 inches   Hip Circumference 40.5 inches   Waist to Hip Ratio 0.84 %   BMI (Calculated) 23.8       Nutrition Therapy Plan and Nutrition Goals:   Nutrition Discharge: Rate Your Plate Scores:   Psychosocial: Target Goals: Acknowledge presence or absence of depression, maximize coping skills, provide  positive support system. Participant is able to verbalize types and ability to use techniques and skills needed for reducing stress and depression.  Initial Review & Psychosocial Screening:     Initial Psych Review & Screening - 11/18/16 1541      Family Dynamics   Comments Ms Barlowe has much stress in her life - her husband is an alcoholic, her daughter died in April 04, 2008, and her daughter's son has a deformed arm and emotional concerns. Ms Otwell also has issues with COPD and how is has changed her life. She has meet with Dr Laymond Purser a few times. Ms Gulledge is looking forward to [facility] for help managing her COPD.     Barriers   Psychosocial barriers to participate in program Psychosocial barriers identified (see note);The patient should benefit from training in stress management and relaxation.     Screening Interventions   Interventions Program counselor consult;Encouraged to exercise      Quality of Life Scores:     Quality of Life - 11/18/16 1548      Quality of Life Scores   Health/Function Pre 20.5 %   Socioeconomic Pre 20.43 %   Psych/Spiritual Pre 20.86 %   Family Pre 20.6 %   GLOBAL Pre 20.57 %      PHQ-9: Recent Review Flowsheet Data    Depression screen [facility] 2/9 11/18/2016 08/05/2016 05/20/2016 05/20/2016 12/07/2014   Decreased Interest 2 0 0 0 0   Down, Depressed, Hopeless 2 1 0 0 0   PHQ - 2 Score 4 1 0 0 0   Altered sleeping 2 - - - -   Tired, decreased energy 3 - - - -   Change in appetite 0 - - - -   Feeling bad or failure about yourself  0 - - - -   Trouble concentrating 1 - - - -   Moving slowly or fidgety/restless 0 - - - -   Suicidal thoughts 0 - - - -   PHQ-9 Score 10 - - - -   Difficult doing work/chores Somewhat difficult - - - -      Psychosocial Evaluation and Intervention:   Psychosocial Re-Evaluation:  Education: Education Goals: Education classes will be provided on a weekly basis, covering required topics. Participant will state  understanding/return demonstration of topics presented.  Learning Barriers/Preferences:     Learning Barriers/Preferences - 11/18/16 1528      Learning Barriers/Preferences   Learning Barriers None   Learning Preferences None      Education  Topics: Initial Evaluation Education: - Verbal, written and demonstration of respiratory meds, RPE/PD scales, oximetry and breathing techniques. Instruction on use of nebulizers and MDIs: cleaning and proper use, rinsing mouth with steroid doses and importance of monitoring MDI activations. Flowsheet Row Pulmonary Rehab from 12/03/2016 in [facility] Cardiac and Pulmonary Rehab  Date  11/18/16  Educator  LB  Instruction Review Code  2- meets goals/outcomes      General Nutrition Guidelines/Fats and Fiber: -Group instruction provided by verbal, written material, models and posters to present the general guidelines for heart healthy nutrition. Gives an explanation and review of dietary fats and fiber.   Controlling Sodium/Reading Food Labels: -Group verbal and written material supporting the discussion of sodium use in heart healthy nutrition. Review and explanation with models, verbal and written materials for utilization of the food label.   Exercise Physiology & Risk Factors: - Group verbal and written instruction with models to review the exercise physiology of the cardiovascular system and associated critical values. Details cardiovascular disease risk factors and the goals associated with each risk factor.   Aerobic Exercise & Resistance Training: - Gives group verbal and written discussion on the health impact of inactivity. On the components of aerobic and resistive training programs and the benefits of this training and how to safely progress through these programs. Flowsheet Row Pulmonary Rehab from 12/03/2016 in [facility] Cardiac and Pulmonary Rehab  Date  12/03/16  Educator  [facility]  Instruction Review Code  2- meets goals/outcomes       Flexibility, Balance, General Exercise Guidelines: - Provides group verbal and written instruction on the benefits of flexibility and balance training programs. Provides general exercise guidelines with specific guidelines to those with heart or lung disease. Demonstration and skill practice provided.   Stress Management: - Provides group verbal and written instruction about the health risks of elevated stress, cause of high stress, and healthy ways to reduce stress.   Depression: - Provides group verbal and written instruction on the correlation between heart/lung disease and depressed mood, treatment options, and the stigmas associated with seeking treatment.   Exercise & Equipment Safety: - Individual verbal instruction and demonstration of equipment use and safety with use of the equipment.   Infection Prevention: - Provides verbal and written material to individual with discussion of infection control including proper hand washing and proper equipment cleaning during exercise session. Flowsheet Row Pulmonary Rehab from 12/03/2016 in [facility] Cardiac and Pulmonary Rehab  Date  11/18/16  Educator  LB  Instruction Review Code  2- meets goals/outcomes      Falls Prevention: - Provides verbal and written material to individual with discussion of falls prevention and safety. Flowsheet Row Pulmonary Rehab from 12/03/2016 in [facility] Cardiac and Pulmonary Rehab  Date  11/18/16  Educator  LB  Instruction Review Code  2- meets goals/outcomes      Diabetes: - Individual verbal and written instruction to review signs/symptoms of diabetes, desired ranges of glucose level fasting, after meals and with exercise. Advice that pre and post exercise glucose checks will be done for 3 sessions at entry of program. Flowsheet Row Pulmonary Rehab from 12/03/2016 in [facility] Cardiac and Pulmonary Rehab  Date  11/26/16  Educator  TS  Instruction Review Code  2- meets goals/outcomes      Chronic Lung  Diseases: - Group verbal and written instruction to review new updates, new respiratory medications, new advancements in procedures and treatments. Provide informative websites and "800" numbers of self-education. Flowsheet Row Pulmonary Rehab from  12/03/2016 in [facility] Cardiac and Pulmonary Rehab  Date  11/26/16  Educator  LB  Instruction Review Code  2- meets goals/outcomes      Lung Procedures: - Group verbal and written instruction to describe testing methods done to diagnose lung disease. Review the outcome of test results. Describe the treatment choices: Pulmonary Function Tests, ABGs and oximetry.   Energy Conservation: - Provide group verbal and written instruction for methods to conserve energy, plan and organize activities. Instruct on pacing techniques, use of adaptive equipment and posture/positioning to relieve shortness of breath.   Triggers: - Group verbal and written instruction to review types of environmental controls: home humidity, furnaces, filters, dust mite/pet prevention, HEPA vacuums. To discuss weather changes, air quality and the benefits of nasal washing.   Exacerbations: - Group verbal and written instruction to provide: warning signs, infection symptoms, calling MD promptly, preventive modes, and value of vaccinations. Review: effective airway clearance, coughing and/or vibration techniques. Create an Sport and exercise psychologist.   Oxygen: - Individual and group verbal and written instruction on oxygen therapy. Includes supplement oxygen, available portable oxygen systems, continuous and intermittent flow rates, oxygen safety, concentrators, and Medicare reimbursement for oxygen. Flowsheet Row Pulmonary Rehab from 12/03/2016 in [facility] Cardiac and Pulmonary Rehab  Date  11/18/16  Educator  LB  Instruction Review Code  2- meets goals/outcomes      Respiratory Medications: - Group verbal and written instruction to review medications for lung disease. Drug class, frequency,  complications, importance of spacers, rinsing mouth after steroid MDI's, and proper cleaning methods for nebulizers. Flowsheet Row Pulmonary Rehab from 12/03/2016 in [facility] Cardiac and Pulmonary Rehab  Date  11/18/16  Educator  LB  Instruction Review Code  2- meets goals/outcomes      AED/CPR: - Group verbal and written instruction with the use of models to demonstrate the basic use of the AED with the basic ABC's of resuscitation.   Breathing Retraining: - Provides individuals verbal and written instruction on purpose, frequency, and proper technique of diaphragmatic breathing and pursed-lipped breathing. Applies individual practice skills. Flowsheet Row Pulmonary Rehab from 12/03/2016 in [facility] Cardiac and Pulmonary Rehab  Date  11/18/16  Educator  LB  Instruction Review Code  2- meets goals/outcomes      Anatomy and Physiology of the Lungs: - Group verbal and written instruction with the use of models to provide basic lung anatomy and physiology related to function, structure and complications of lung disease.   Heart Failure: - Group verbal and written instruction on the basics of heart failure: signs/symptoms, treatments, explanation of ejection fraction, enlarged heart and cardiomyopathy.   Sleep Apnea: - Individual verbal and written instruction to review Obstructive Sleep Apnea. Review of risk factors, methods for diagnosing and types of masks and machines for OSA. Flowsheet Row Pulmonary Rehab from 12/03/2016 in [facility] Cardiac and Pulmonary Rehab  Date  11/18/16  Educator  LB  Instruction Review Code  2- meets goals/outcomes      Anxiety: - Provides group, verbal and written instruction on the correlation between heart/lung disease and anxiety, treatment options, and management of anxiety.   Relaxation: - Provides group, verbal and written instruction about the benefits of relaxation for patients with heart/lung disease. Also provides patients with examples of relaxation  techniques.   Knowledge Questionnaire Score:     Knowledge Questionnaire Score - 11/18/16 1528      Knowledge Questionnaire Score   Pre Score 8/10       Core Components/Risk Factors/Patient Goals at Admission:  Personal Goals and Risk Factors at Admission - 11/18/16 1538      Core Components/Risk Factors/Patient Goals on Admission   Sedentary Yes   Intervention Provide advice, education, support and counseling about physical activity/exercise needs.;Develop an individualized exercise prescription for aerobic and resistive training based on initial evaluation findings, risk stratification, comorbidities and participant's personal goals.   Expected Outcomes Achievement of increased cardiorespiratory fitness and enhanced flexibility, muscular endurance and strength shown through measurements of functional capacity and personal statement of participant.   Increase Strength and Stamina Yes   Intervention Provide advice, education, support and counseling about physical activity/exercise needs.;Develop an individualized exercise prescription for aerobic and resistive training based on initial evaluation findings, risk stratification, comorbidities and participant's personal goals.   Expected Outcomes Achievement of increased cardiorespiratory fitness and enhanced flexibility, muscular endurance and strength shown through measurements of functional capacity and personal statement of participant.   Improve shortness of breath with ADL's Yes   Intervention Provide education, individualized exercise plan and daily activity instruction to help decrease symptoms of SOB with activities of daily living.   Expected Outcomes Short Term: Achieves a reduction of symptoms when performing activities of daily living.   Develop more efficient breathing techniques such as purse lipped breathing and diaphragmatic breathing; and practicing self-pacing with activity Yes   Intervention Provide education,  demonstration and support about specific breathing techniuqes utilized for more efficient breathing. Include techniques such as pursed lipped breathing, diaphragmatic breathing and self-pacing activity.   Expected Outcomes Short Term: Participant will be able to demonstrate and use breathing techniques as needed throughout daily activities.   Increase knowledge of respiratory medications and ability to use respiratory devices properly  Yes  Spiriva, Advair, Albuterol, Spacer given; SVN Albuterol   Intervention Provide education and demonstration as needed of appropriate use of medications, inhalers, and oxygen therapy.   Expected Outcomes Short Term: Achieves understanding of medications use. Understands that oxygen is a medication prescribed by physician. Demonstrates appropriate use of inhaler and oxygen therapy.   Diabetes Yes   Intervention Provide education about signs/symptoms and action to take for hypo/hyperglycemia.;Provide education about proper nutrition, including hydration, and aerobic/resistive exercise prescription along with prescribed medications to achieve blood glucose in normal ranges: Fasting glucose 65-99 mg/dL   Expected Outcomes Short Term: Participant verbalizes understanding of the signs/symptoms and immediate care of hyper/hypoglycemia, proper foot care and importance of medication, aerobic/resistive exercise and nutrition plan for blood glucose control.;Long Term: Attainment of HbA1C < 7%.   Heart Failure Yes   Intervention Provide a combined exercise and nutrition program that is supplemented with education, support and counseling about heart failure. Directed toward relieving symptoms such as shortness of breath, decreased exercise tolerance, and extremity edema.   Expected Outcomes Improve functional capacity of life   Hypertension Yes   Intervention Provide education on lifestyle modifcations including regular physical activity/exercise, weight management, moderate sodium  restriction and increased consumption of fresh fruit, vegetables, and low fat dairy, alcohol moderation, and smoking cessation.;Monitor prescription use compliance.   Expected Outcomes Short Term: Continued assessment and intervention until BP is < 140/42mm HG in hypertensive participants. < 130/47mm HG in hypertensive participants with diabetes, heart failure or chronic kidney disease.;Long Term: Maintenance of blood pressure at goal levels.   Lipids Yes   Intervention Provide education and support for participant on nutrition & aerobic/resistive exercise along with prescribed medications to achieve LDL 70mg , HDL >40mg .   Expected Outcomes Short Term: Participant states understanding of desired cholesterol values and  is compliant with medications prescribed. Participant is following exercise prescription and nutrition guidelines.;Long Term: Cholesterol controlled with medications as prescribed, with individualized exercise RX and with personalized nutrition plan. Value goals: LDL < 70mg , HDL > 40 mg.      Core Components/Risk Factors/Patient Goals Review:      Goals and Risk Factor Review    Row Name 11/26/16 1442             Core Components/Risk Factors/Patient Goals Review   Personal Goals Review Develop more efficient breathing techniques such as purse lipped breathing and diaphragmatic breathing and practicing self-pacing with activity.       Review Reviewed pursed lip breathing technique with Corrie DandyMary today.       Expected Outcomes Short: Corrie DandyMary will become more proficient at using PLB.  Long: Corrie DandyMary will become independent in using PLB.          Core Components/Risk Factors/Patient Goals at Discharge (Final Review):      Goals and Risk Factor Review - 11/26/16 1442      Core Components/Risk Factors/Patient Goals Review   Personal Goals Review Develop more efficient breathing techniques such as purse lipped breathing and diaphragmatic breathing and practicing self-pacing with activity.    Review Reviewed pursed lip breathing technique with Corrie DandyMary today.   Expected Outcomes Short: Corrie DandyMary will become more proficient at using PLB.  Long: Corrie DandyMary will become independent in using PLB.      ITP Comments:   Comments: 30 day review

## 2016-12-04 LAB — GLUCOSE, CAPILLARY: GLUCOSE-CAPILLARY: 125 mg/dL — AB (ref 65–99)

## 2016-12-05 ENCOUNTER — Encounter: Payer: Medicare Other | Attending: Pulmonary Disease

## 2016-12-05 DIAGNOSIS — J449 Chronic obstructive pulmonary disease, unspecified: Secondary | ICD-10-CM | POA: Insufficient documentation

## 2016-12-09 ENCOUNTER — Encounter: Payer: Self-pay | Admitting: *Deleted

## 2016-12-10 ENCOUNTER — Encounter: Payer: Medicare Other | Admitting: *Deleted

## 2016-12-10 DIAGNOSIS — J449 Chronic obstructive pulmonary disease, unspecified: Secondary | ICD-10-CM

## 2016-12-10 NOTE — Progress Notes (Signed)
Daily Session Note  Patient Details  Name: Tami Skinner MRN: [id number] Date of Birth: 07-14-1946 Referring Provider:   April Manson [facility] Rehab from 11/18/2016 in [facility]  Referring Provider  Simonds      Encounter Date: 12/10/2016  Check In:     Session Check In - 12/10/16 1138      Check-In   Location [facility]-Cardiac & Pul[facility] Re[facility]   Staff Present Alberteen Sam, MA, ACSM RCEP, Exercise Physiologist;Patricia Surles RN BSN;Laureen Owens Shark, BS, RRT, Respiratory Therapist   Supervising physician immediately available to respond to emergencies [facility] immediately available ER MD   Physician(s) Drs. Malinda and Robinson   Medication changes reported     No   Fall or balance concerns reported    No   Warm-up and Cool-down Performed as group-led Location manager Performed Yes   VAD Patient? No     Pain Assessment   Currently in Pain? No/denies   Multiple Pain Sites No         Goals Met:  Proper associated with RPD/PD & O2 Sat Independence with exercise equipment Using PLB without cueing & demonstrates good technique Exercise tolerated well Strength training completed today  Goals Unmet:  Not Applicable  Comments: Pt able to follow exercise prescription today without complaint.  Will continue to monitor for progression.    Dr. Emily Filbert is Medical Director for [place] and [facility].

## 2016-12-12 ENCOUNTER — Encounter: Payer: Self-pay | Admitting: Pulmonary Disease

## 2016-12-12 ENCOUNTER — Ambulatory Visit (INDEPENDENT_AMBULATORY_CARE_PROVIDER_SITE_OTHER): Payer: Medicare Other | Admitting: Pulmonary Disease

## 2016-12-12 VITALS — BP 132/88 | HR 79 | Wt 147.0 lb

## 2016-12-12 DIAGNOSIS — R0902 Hypoxemia: Secondary | ICD-10-CM

## 2016-12-12 DIAGNOSIS — J449 Chronic obstructive pulmonary disease, unspecified: Secondary | ICD-10-CM | POA: Diagnosis not present

## 2016-12-12 MED ORDER — ALBUTEROL SULFATE (2.5 MG/3ML) 0.083% IN NEBU: 2.5000 mg | INHALATION_SOLUTION | Freq: Four times a day (QID) | RESPIRATORY_TRACT | 12 refills | Status: DC | PRN

## 2016-12-12 MED ORDER — FLUTICASONE-SALMETEROL 250-50 MCG/DOSE IN AEPB: 1.0000 | INHALATION_SPRAY | Freq: Two times a day (BID) | RESPIRATORY_TRACT | 10 refills | Status: DC

## 2016-12-12 NOTE — Progress Notes (Signed)
PULMONARY OFFICE FOLLOW UP NOTE  Requesting MD/Service: Self Date of initial consultation: 09/29/16 Reason for consultation: COPD, smoker  PT PROFILE: [age] F smoker (abstinent X 1 month) previously followed by Dr Kendrick FriesMcQuaid for lung nodule and moderate COPD. Maintained on Spiriva, Advair, PRN albuterol with class III dyspnea  DATA: Spirometry 02/03/12: Moderate obstruction (FEV1 1.21 liters, 49% pred) CT chest 09/27/14: Stable right lower lobe pulmonary nodule over over 2 year interval consistent benign etiology PFTs 11/11/16: mod-severe obstruction (FEV1 0.95 liters, 44% pred), mild restriction, severe reduction in DLCO. Lung volumes are porbably underestimated by inadequate test performance CXR 11/28/16: hyperinflation. NACPD  INTERVAL: No major events  SUBJ: DOE is improved wearing O2 during day. Remains abstinent from cigarettes. Participating in pulmonary rehab. Overall energy and exercise tolerance are improved  MEDICATIONS: I have reviewed all medications and confirmed regimen as documented  Vitals:   12/12/16 1154  BP: 132/88  Pulse: 79  SpO2: 93%  Weight: 147 lb (66.7 kg)   RA  EXAM:  Gen: WDWN, No overt respiratory distress HEENT: NCAT, sclera white, oropharynx normal Neck: Supple without LAN, thyromegaly, JVD Thorax - moderate kyphoscoliosis Lungs: breath sounds moderately diminished, percussion normal, No wheezes Cardiovascular: Reg, no murmurs Abdomen: Soft, nontender, normal BS Ext: without clubbing, cyanosis, edema Neuro: CNs grossly intact, motor and sensory intact Skin: Limited exam, no lesions noted  DATA:   BMP Latest Ref Rng & Units 10/31/2016 05/20/2016 10/17/2015  Glucose 65 - 99 mg/dL 474(Q111(H) 595(G129(H) 387(F111(H)  BUN 7 - 25 mg/dL 14 16 13   Creatinine 0.60 - 0.93 mg/dL 6.430.82 3.290.75 5.180.85  Sodium 135 - 146 mmol/L 138 138 138  Potassium 3.5 - 5.3 mmol/L 4.2 4.3 4.8  Chloride 98 - 110 mmol/L 99 102 101  CO2 20 - 31 mmol/L 23 31 32  Calcium 8.6 - 10.4 mg/dL 84.110.2  66.010.1 9.8    CBC Latest Ref Rng & Units 10/31/2016 05/20/2016 09/30/2014  WBC 3.8 - 10.8 K/uL 6.3 6.5 6.1  Hemoglobin 11.7 - 15.5 g/dL 63.013.6 16.013.9 10.913.0  Hematocrit 35.0 - 45.0 % 41.5 41.5 39.0  Platelets 140 - 400 K/uL 292 201.0 234    CXR:  No recent films  IMPRESSION:   1) Former Smoker - Previously 1/2 - 3 ppd 2) Mod-severe COPD with class II/III dyspnea  3) Exertional hypoxemia 4) OSA with nocturnal hypoxemia - well treated with CPAP and nocturnal O2 5) RLL lung nodule - reportedly stable for > 2 yrs and deemed benign   PLAN:  1) Continue Spiriva and Advair as maintenance inhalers 2) Continue Ventolin as rescue inhaler 3) continue albuterol nebulizer as emergency medication if the Ventolin does not give sufficient relief 4) Continue O2 with sleep and exertion 5) Follow up in 6 months or PRN   Billy Fischeravid Rueben Kassim, MD PCCM service Mobile [phone] Pager [phone] 12/18/2016

## 2016-12-12 NOTE — Patient Instructions (Addendum)
Continue Advair and Spiriva  Continue albuterol as needed - I have entered refill on the nebulizer solution  Continue oxygen with exertion and sleep  Follow up in 6 months or as needed

## 2016-12-15 ENCOUNTER — Encounter: Payer: Self-pay | Admitting: Respiratory Therapy

## 2016-12-16 ENCOUNTER — Other Ambulatory Visit: Payer: Self-pay | Admitting: Internal Medicine

## 2016-12-17 NOTE — Telephone Encounter (Signed)
Last filled 10/21/16--please advise 

## 2016-12-17 NOTE — Telephone Encounter (Signed)
Rx called in to pharmacy. 

## 2016-12-17 NOTE — Telephone Encounter (Signed)
Ok to phone in Xanax 

## 2016-12-24 ENCOUNTER — Telehealth: Payer: Self-pay | Admitting: *Deleted

## 2016-12-24 ENCOUNTER — Encounter: Payer: Self-pay | Admitting: *Deleted

## 2016-12-24 NOTE — Telephone Encounter (Signed)
Tami Skinner called and said she is sorry that she is missing Pulmonary Rehab but that she still is sick with a cough. Tami Skinner said she will see the doctor tomorrow.

## 2016-12-25 ENCOUNTER — Encounter: Payer: Self-pay | Admitting: Internal Medicine

## 2016-12-30 ENCOUNTER — Encounter: Payer: Self-pay | Admitting: Respiratory Therapy

## 2016-12-30 ENCOUNTER — Telehealth: Payer: Self-pay | Admitting: Respiratory Therapy

## 2016-12-30 DIAGNOSIS — J449 Chronic obstructive pulmonary disease, unspecified: Secondary | ICD-10-CM

## 2016-12-30 NOTE — Telephone Encounter (Signed)
Called and left message from [facility]; last attended 12/10/16.

## 2017-01-01 ENCOUNTER — Other Ambulatory Visit: Payer: Self-pay | Admitting: Internal Medicine

## 2017-01-02 ENCOUNTER — Encounter: Payer: Medicare Other | Attending: Pulmonary Disease

## 2017-01-02 DIAGNOSIS — J449 Chronic obstructive pulmonary disease, unspecified: Secondary | ICD-10-CM | POA: Insufficient documentation

## 2017-01-06 ENCOUNTER — Encounter: Payer: Self-pay | Admitting: Internal Medicine

## 2017-01-07 ENCOUNTER — Telehealth: Payer: Self-pay | Admitting: Respiratory Therapy

## 2017-01-07 ENCOUNTER — Encounter: Payer: Self-pay | Admitting: Respiratory Therapy

## 2017-01-07 DIAGNOSIS — J449 Chronic obstructive pulmonary disease, unspecified: Secondary | ICD-10-CM

## 2017-01-07 NOTE — Telephone Encounter (Signed)
Ms Tami Skinner called 01/06/17 and stated she had been sick. She is planning to return to [facility] this week. Last attended 12/10/16.

## 2017-01-12 ENCOUNTER — Encounter: Payer: Self-pay | Admitting: *Deleted

## 2017-01-12 DIAGNOSIS — J449 Chronic obstructive pulmonary disease, unspecified: Secondary | ICD-10-CM

## 2017-01-12 NOTE — Progress Notes (Signed)
Pulmonary Individual Treatment Plan  Patient Details  Name: Tami Skinner MRN: [id number] Date of Birth: April 21, 1946 Referring Provider:   Flowsheet Row Pulmonary Rehab from 11/18/2016 in [facility] Cardiac and Pulmonary Rehab  Referring Provider  Simonds      Initial Encounter Date:  Flowsheet Row Pulmonary Rehab from 11/18/2016 in [facility] Cardiac and Pulmonary Rehab  Date  11/18/16  Referring Provider  Simonds      Visit Diagnosis: Chronic obstructive pulmonary disease, unspecified COPD type ([place])  Patient's Home Medications on Admission:  Current Outpatient Prescriptions:  .  albuterol (PROVENTIL) (2.5 MG/3ML) 0.083% nebulizer solution, Take 3 mLs (2.5 mg total) by nebulization every 6 (six) hours as needed for wheezing or shortness of breath., Disp: 75 mL, Rfl: 12 .  ALPRAZolam (XANAX) 0.5 MG tablet, TAKE 1 TABLET BY MOUTH EVERY 8 HOURS AS NEEDED, Disp: 90 tablet, Rfl: 0 .  Cholecalciferol (VITAMIN D-3) 1000 UNITS CAPS, Take 2 capsules by mouth. , Disp: , Rfl:  .  Fluticasone-Salmeterol (ADVAIR DISKUS) 250-50 MCG/DOSE AEPB, Inhale 1 puff into the lungs 2 (two) times daily., Disp: 60 each, Rfl: 10 .  metFORMIN (GLUCOPHAGE) 500 MG tablet, TAKE 1 TABLET BY MOUTH TWICE A DAY WITH A MEAL, Disp: 180 tablet, Rfl: 3 .  metoprolol succinate (TOPROL-XL) 25 MG 24 hr tablet, Take 1 tablet (25 mg total) by mouth daily., Disp: 90 tablet, Rfl: 3 .  ondansetron (ZOFRAN) 4 MG tablet, Take 1 tablet (4 mg total) by mouth every 8 (eight) hours as needed., Disp: 20 tablet, Rfl: 0 .  ONE TOUCH ULTRA TEST test strip, TEST BLOOD SUGAR AT HOME TWICE A DAY, Disp: 100 each, Rfl: 5 .  OXYGEN-HELIUM IN, Inhale 2 % into the lungs at bedtime.  , Disp: , Rfl:  .  rosuvastatin (CRESTOR) 5 MG tablet, Take 1 tablet (5 mg total) by mouth daily., Disp: 90 tablet, Rfl: 0 .  sertraline (ZOLOFT) 100 MG tablet, TAKE 1 AND 1/2 TABLET BY MOUTH TWICE A DAY, Disp: 270 tablet, Rfl: 0 .  SPIRIVA HANDIHALER 18 MCG inhalation capsule,  INHALE 1 CAPSULE VIA HANDIHALER DAILY, Disp: 90 capsule, Rfl: 1 .  valsartan (DIOVAN) 160 MG tablet, Take 1 tablet (160 mg total) by mouth daily., Disp: 90 tablet, Rfl: 3 .  VENTOLIN HFA 108 (90 Base) MCG/ACT inhaler, INHALE TWO PUFFS INTO THE LUNGS EVERY FOUR HOURS AS NEEDED FOR WHEEZING, Disp: 18 Inhaler, Rfl: 2  Past Medical History: Past Medical History:  Diagnosis Date  . Anxiety   . Asthmatic bronchitis   . COPD (chronic obstructive pulmonary disease) ([place])   . Depression   . Diabetes mellitus   . Diverticulitis   . Diverticulosis   . Hyperlipidemia   . Hypertension   . Osteopenia   . Sleep apnea   . Stress-induced cardiomyopathy    a. echo 09/28/2014: EF 45-50%, severe HK of mid-distal anterior, apical and mid-distal inferior walls suggestive of stress induced CM, mildly dilated LA, mildly dilated PASP, mild TR, trivial pericardial effusion    Tobacco Use: History  Smoking Status  . Former Smoker  . Packs/day: 0.25  . Years: 40.00  . Types: Cigarettes  . Quit date: 09/01/2016  Smokeless Tobacco  . Never Used    Comment: Uses nicotrol inhaler on occasion    Labs: Recent Review Flowsheet Data    Labs for ITP Cardiac and Pulmonary Rehab Latest Ref Rng & Units 09/28/2014 01/09/2015 10/17/2015 05/20/2016 11/04/2016   Cholestrol 0 - 200 mg/dL 110 141 150 - -  LDLCALC 0 - 99 mg/dL 36 42 66 - -   LDLDIRECT mg/dL - - - - -   HDL >05.67 mg/dL 54 88.93 38.82 - -   Trlycerides 0.0 - 149.0 mg/dL 666 648.0(H) 83.0 - -   Hemoglobin A1c 4.6 - 6.5 % - 6.6(H) 6.7(H) 6.2 6.4       ADL UCSD:     Pulmonary Assessment Scores    Row Name 11/18/16 1531         ADL UCSD   ADL Phase Entry     SOB Score total 76     Rest 1     Walk 3     Stairs 5     Bath 3     Dress 2     Shop 5        Pulmonary Function Assessment:     Pulmonary Function Assessment - 11/18/16 1530      Pulmonary Function Tests   RV% 85 %   DLCO% 35 %     Initial Spirometry Results   FVC% 64 %    FEV1% 42 %   FEV1/FVC Ratio 50   Comments Test date 11/11/16     Post Bronchodilator Spirometry Results   FVC% 66 %   FEV1% 44 %   FEV1/FVC Ratio 51     Breath   Bilateral Breath Sounds Clear;Decreased   Shortness of Breath Yes;Limiting activity;Fear of Shortness of Breath      Exercise Target Goals:    Exercise Program Goal: Individual exercise prescription set with THRR, safety & activity barriers. Participant demonstrates ability to understand and report RPE using BORG scale, to self-measure pulse accurately, and to acknowledge the importance of the exercise prescription.  Exercise Prescription Goal: Starting with aerobic activity 30 plus minutes a day, 3 days per week for initial exercise prescription. Provide home exercise prescription and guidelines that participant acknowledges understanding prior to discharge.  Activity Barriers & Risk Stratification:     Activity Barriers & Cardiac Risk Stratification - 11/18/16 1528      Activity Barriers & Cardiac Risk Stratification   Activity Barriers Arthritis;Shortness of Breath;Deconditioning;Muscular Weakness;History of Falls   Cardiac Risk Stratification Moderate      6 Minute Walk:     6 Minute Walk    Row Name 11/18/16 1552         6 Minute Walk   Phase Initial     Distance 840 feet     Walk Time 6 minutes     # of Rest Breaks 0     MPH 1.6     METS 2.23     RPE 13     Perceived Dyspnea  3     VO2 Peak 7.27     Symptoms No     Resting HR 66 bpm     Resting BP 126/66     Max Ex. HR 90 bpm     Max Ex. BP 122/68       Interval HR   Baseline HR 66     1 Minute HR 78     2 Minute HR 82     3 Minute HR 86     4 Minute HR 90     5 Minute HR 82     2 Minute Post HR 65     Interval Heart Rate? Yes       Interval Oxygen   Interval Oxygen? Yes     Baseline Oxygen Saturation % 96 %  Baseline Liters of Oxygen 2 L     1 Minute Oxygen Saturation % 92 %     1 Minute Liters of Oxygen 2 L     2 Minute  Oxygen Saturation % 93 %     2 Minute Liters of Oxygen 2 L     3 Minute Oxygen Saturation % 94 %     3 Minute Liters of Oxygen 2 L     4 Minute Oxygen Saturation % 92 %     4 Minute Liters of Oxygen 2 L     5 Minute Oxygen Saturation % 93 %     5 Minute Liters of Oxygen 2 L     6 Minute Oxygen Saturation % 93 %     6 Minute Liters of Oxygen 2 L     2 Minute Post Oxygen Saturation % 96 %     2 Minute Post Liters of Oxygen 2 L       Oxygen Initial Assessment:   Oxygen Re-Evaluation:   Oxygen Discharge (Final Oxygen Re-Evaluation):   Initial Exercise Prescription:     Initial Exercise Prescription - 11/18/16 1500      Date of Initial Exercise RX and Referring Provider   Date 11/18/16   Referring Provider Simonds     Oxygen   Oxygen Continuous   Liters 2     Treadmill   MPH 1.4   Grade 0.5   Minutes 15   METs 2.07     NuStep   Level 2   Minutes 15   METs 2     Biostep-RELP   Level 2   Minutes 15   METs 2     Prescription Details   Frequency (times per week) 3   Duration Progress to 45 minutes of aerobic exercise without signs/symptoms of physical distress     Intensity   THRR 40-80% of Max Heartrate 99-132   Ratings of Perceived Exertion 11-13   Perceived Dyspnea 0-4     Progression   Progression Continue to progress workloads to maintain intensity without signs/symptoms of physical distress.     Resistance Training   Training Prescription Yes   Weight 2   Reps 10-15      Perform Capillary Blood Glucose checks as needed.  Exercise Prescription Changes:     Exercise Prescription Changes    Row Name 11/26/16 1400 12/03/16 1400 12/17/16 1600         Response to Exercise   Blood Pressure (Admit) 120/64 126/70 134/80     Blood Pressure (Exercise) 100/60 128/70 126/64     Blood Pressure (Exit) 84/52  water recheck 112/56 seated and 96/56, 98/58 standing 108/60 124/60     Heart Rate (Admit) 76 bpm 77 bpm  -     Heart Rate (Exercise) 78 bpm  80 bpm 84 bpm     Heart Rate (Exit) 70 bpm 71 bpm 90 bpm     Oxygen Saturation (Admit) 91 % 95 %  -     Oxygen Saturation (Exercise) 93 % 94 %  -     Oxygen Saturation (Exit) 92 % 95 % 92 %     Rating of Perceived Exertion (Exercise) '13 13 13     '$ Perceived Dyspnea (Exercise) '3 2 3     '$ Symptoms dizziness at the end none none     Duration Progress to 45 minutes of aerobic exercise without signs/symptoms of physical distress Progress to 45 minutes of aerobic exercise without signs/symptoms  of physical distress Progress to 45 minutes of aerobic exercise without signs/symptoms of physical distress     Intensity THRR unchanged THRR unchanged THRR unchanged       Progression   Progression Continue to progress workloads to maintain intensity without signs/symptoms of physical distress. Continue to progress workloads to maintain intensity without signs/symptoms of physical distress. Continue to progress workloads to maintain intensity without signs/symptoms of physical distress.     Average METs 1.8 1.97 2       Resistance Training   Training Prescription Yes Yes Yes     Weight [place] Band Yellow Band Green Band     Reps 10-15 10-15 10-15       Interval Training   Interval Training  - No No       Oxygen   Oxygen Continuous Continuous Continuous     Liters '2 2 2       '$ Treadmill   MPH 1 1  -     Grade 0.5 0.5  -     Minutes 15 15  -     METs 1.83 1.83  -       NuStep   Level '2 2 2     '$ Minutes '15 15 15     '$ METs 1.8 2.1 2       Biostep-RELP   Level  - 1 3     Minutes  - 15 15     METs  - 2 2       Exercise Review   Progression -  First Full Day of Exercise Yes Yes        Exercise Comments:     Exercise Comments    Row Name 11/26/16 1443 12/03/16 1359 12/17/16 1601       Exercise Comments First full day of exercise!  Patient was oriented to gym and equipment including functions, settings, policies, and procedures.  Patient's individual exercise prescription and treatment  plan were reviewed.  All starting workloads were established based on the results of the 6 minute walk test done at initial orientation visit.  The plan for exercise progression was also introduced and progression will be customized based on patient's performance and goals. Shy is off to a good start in exercise.  She has gotten three full days of exercise.  Iyanla is doing well on all of her equipment, we will continue to monitor her progression. Bryella has only attended once since last review.  She is only attending on Wednesdays and Fridays.  We will continue to to monitor her progression.        Exercise Goals and Review:   Exercise Goals Re-Evaluation :   Discharge Exercise Prescription (Final Exercise Prescription Changes):     Exercise Prescription Changes - 12/17/16 1600      Response to Exercise   Blood Pressure (Admit) 134/80   Blood Pressure (Exercise) 126/64   Blood Pressure (Exit) 124/60   Heart Rate (Exercise) 84 bpm   Heart Rate (Exit) 90 bpm   Oxygen Saturation (Exit) 92 %   Rating of Perceived Exertion (Exercise) 13   Perceived Dyspnea (Exercise) 3   Symptoms none   Duration Progress to 45 minutes of aerobic exercise without signs/symptoms of physical distress   Intensity THRR unchanged     Progression   Progression Continue to progress workloads to maintain intensity without signs/symptoms of physical distress.   Average METs 2     Resistance Training   Training Prescription Yes   Weight  Green Band   Reps 10-15     Interval Training   Interval Training No     Oxygen   Oxygen Continuous   Liters 2     NuStep   Level 2   Minutes 15   METs 2     Biostep-RELP   Level 3   Minutes 15   METs 2     Exercise Review   Progression Yes      Nutrition:  Target Goals: Understanding of nutrition guidelines, daily intake of sodium '1500mg'$ , cholesterol '200mg'$ , calories 30% from fat and 7% or less from saturated fats, daily to have 5 or more servings of fruits  and vegetables.  Biometrics:     Pre Biometrics - 11/18/16 1552      Pre Biometrics   Height '5\' 5"'$  (1.651 m)   Weight 142 lb 11.2 oz (64.7 kg)   Waist Circumference 34 inches   Hip Circumference 40.5 inches   Waist to Hip Ratio 0.84 %   BMI (Calculated) 23.8       Nutrition Therapy Plan and Nutrition Goals:   Nutrition Discharge: Rate Your Plate Scores:   Nutrition Goals Re-Evaluation:   Nutrition Goals Discharge (Final Nutrition Goals Re-Evaluation):   Psychosocial: Target Goals: Acknowledge presence or absence of significant depression and/or stress, maximize coping skills, provide positive support system. Participant is able to verbalize types and ability to use techniques and skills needed for reducing stress and depression.   Initial Review & Psychosocial Screening:     Initial Psych Review & Screening - 11/18/16 1541      Family Dynamics   Comments Ms Yard has much stress in her life - her husband is an alcoholic, her daughter died in Jan 16, 2008, and her daughter's son has a deformed arm and emotional concerns. Ms Penton also has issues with COPD and how is has changed her life. She has meet with Dr Rexene [name] a few times. Ms Sannes is looking forward to [place] for help managing her COPD.     Barriers   Psychosocial barriers to participate in program Psychosocial barriers identified (see note);The patient should benefit from training in stress management and relaxation.     Screening Interventions   Interventions Program counselor consult;Encouraged to exercise      Quality of Life Scores:     Quality of Life - 11/18/16 1548      Quality of Life Scores   Health/Function Pre 20.5 %   Socioeconomic Pre 20.43 %   Psych/Spiritual Pre 20.86 %   Family Pre 20.6 %   GLOBAL Pre 20.57 %      PHQ-9: Recent Review Flowsheet Data    Depression screen [place] 2/9 11/18/2016 08/05/2016 05/20/2016 05/20/2016 12/07/2014   Decreased Interest 2 0 0 0 0   Down, Depressed,  Hopeless 2 1 0 0 0   PHQ - 2 Score 4 1 0 0 0   Altered sleeping 2 - - - -   Tired, decreased energy 3 - - - -   Change in appetite 0 - - - -   Feeling bad or failure about yourself  0 - - - -   Trouble concentrating 1 - - - -   Moving slowly or fidgety/restless 0 - - - -   Suicidal thoughts 0 - - - -   PHQ-9 Score 10 - - - -   Difficult doing work/chores Somewhat difficult - - - -     Interpretation of Total Score  Total Score  Depression Severity:  1-4 = Minimal depression, 5-9 = Mild depression, 10-14 = Moderate depression, 15-19 = Moderately severe depression, 20-27 = Severe depression   Psychosocial Evaluation and Intervention:     Psychosocial Evaluation - 12/10/16 1229      Psychosocial Evaluation & Interventions   Interventions Encouraged to exercise with the program and follow exercise prescription;Relaxation education;Stress management education   Comments Counselor met with Ms. Demuro today for initial psychosocial evaluation.  She is a 71 year old who has COPD.  She has a limited support system living with an alcoholic spouse.  She relies some on her grandson who is close by and her daughter is her daily support by phone since she lives in [place].  Ms. Viona Gilmore states she is sleeping well although it is 2-10AM - it is "still 8 hours."  She states her appetite has increased significantly since she stropped smoking in October .  She has a 20 year history of depression and anxiety and states her medications manage this currently for the most part; although she continues to [place]  She has seen a counselor regularly and is followed on her medications by a PA currently.  Ms. Viona Gilmore states the stress in her life is mostly living with an alcoholic and coping with that, as well as her own health.  She attended the Relaxation education today and reported she needs to learn to relax more and worry less.  Her goals for this program are to learn to relax  and to breathe better in general.  Counselor will be providing Ms. W a list of local support groups that may help her cope and feel less isolated.  Counselor and staff will be following with Ms. W throughout the course of this program.     Continue Psychosocial Services  Yes  Follow on mood; anxiety and possible support groups.        Psychosocial Re-Evaluation:     Psychosocial Re-Evaluation    [place] Name 12/24/16 1046             Psychosocial Re-Evaluation   Comments Itzamar called and said she is sorry that she is missing Pulmonary Rehab but that she still is sick with a cough. Jovie said she will see the doctor tomorrow.        Interventions Encouraged to attend Pulmonary Rehabilitation for the exercise          Psychosocial Discharge (Final Psychosocial Re-Evaluation):     Psychosocial Re-Evaluation - 12/24/16 1046      Psychosocial Re-Evaluation   Comments Bruna called and said she is sorry that she is missing Pulmonary Rehab but that she still is sick with a cough. Caya said she will see the doctor tomorrow.    Interventions Encouraged to attend Pulmonary Rehabilitation for the exercise      Education: Education Goals: Education classes will be provided on a weekly basis, covering required topics. Participant will state understanding/return demonstration of topics presented.  Learning Barriers/Preferences:     Learning Barriers/Preferences - 11/18/16 1528      Learning Barriers/Preferences   Learning Barriers None   Learning Preferences None      Education Topics: Initial Evaluation Education: - Verbal, written and demonstration of respiratory meds, RPE/PD scales, oximetry and breathing techniques. Instruction on use of nebulizers and MDIs: cleaning and proper use, rinsing mouth with steroid doses and importance of monitoring MDI activations. Flowsheet Row Pulmonary Rehab from 12/10/2016 in [facility] Cardiac and Pulmonary  Rehab  Date  11/18/16  Educator  LB  Instruction  Review Code  2- meets goals/outcomes      General Nutrition Guidelines/Fats and Fiber: -Group instruction provided by verbal, written material, models and posters to present the general guidelines for heart healthy nutrition. Gives an explanation and review of dietary fats and fiber.   Controlling Sodium/Reading Food Labels: -Group verbal and written material supporting the discussion of sodium use in heart healthy nutrition. Review and explanation with models, verbal and written materials for utilization of the food label.   Exercise Physiology & Risk Factors: - Group verbal and written instruction with models to review the exercise physiology of the cardiovascular system and associated critical values. Details cardiovascular disease risk factors and the goals associated with each risk factor.   Aerobic Exercise & Resistance Training: - Gives group verbal and written discussion on the health impact of inactivity. On the components of aerobic and resistive training programs and the benefits of this training and how to safely progress through these programs. Flowsheet Row Pulmonary Rehab from 12/10/2016 in [facility] Cardiac and Pulmonary Rehab  Date  12/03/16  Educator  [facility]  Instruction Review Code  2- meets goals/outcomes      Flexibility, Balance, General Exercise Guidelines: - Provides group verbal and written instruction on the benefits of flexibility and balance training programs. Provides general exercise guidelines with specific guidelines to those with heart or lung disease. Demonstration and skill practice provided.   Stress Management: - Provides group verbal and written instruction about the health risks of elevated stress, cause of high stress, and healthy ways to reduce stress.   Depression: - Provides group verbal and written instruction on the correlation between heart/lung disease and depressed mood, treatment options, and the stigmas associated with seeking  treatment.   Exercise & Equipment Safety: - Individual verbal instruction and demonstration of equipment use and safety with use of the equipment.   Infection Prevention: - Provides verbal and written material to individual with discussion of infection control including proper hand washing and proper equipment cleaning during exercise session. Flowsheet Row Pulmonary Rehab from 12/10/2016 in [facility] Cardiac and Pulmonary Rehab  Date  11/18/16  Educator  LB  Instruction Review Code  2- meets goals/outcomes      Falls Prevention: - Provides verbal and written material to individual with discussion of falls prevention and safety. Flowsheet Row Pulmonary Rehab from 12/10/2016 in [facility] Cardiac and Pulmonary Rehab  Date  11/18/16  Educator  LB  Instruction Review Code  2- meets goals/outcomes      Diabetes: - Individual verbal and written instruction to review signs/symptoms of diabetes, desired ranges of glucose level fasting, after meals and with exercise. Advice that pre and post exercise glucose checks will be done for 3 sessions at entry of program. Flowsheet Row Pulmonary Rehab from 12/10/2016 in [facility] Cardiac and Pulmonary Rehab  Date  11/26/16  Educator  TS  Instruction Review Code  2- meets goals/outcomes      Chronic Lung Diseases: - Group verbal and written instruction to review new updates, new respiratory medications, new advancements in procedures and treatments. Provide informative websites and "800" numbers of self-education. Flowsheet Row Pulmonary Rehab from 12/10/2016 in [facility] Cardiac and Pulmonary Rehab  Date  11/26/16  Educator  LB  Instruction Review Code  2- meets goals/outcomes      Lung Procedures: - Group verbal and written instruction to describe testing methods done to diagnose lung disease. Review the outcome of test results.  Describe the treatment choices: Pulmonary Function Tests, ABGs and oximetry.   Energy Conservation: - Provide group verbal and written  instruction for methods to conserve energy, plan and organize activities. Instruct on pacing techniques, use of adaptive equipment and posture/positioning to relieve shortness of breath.   Triggers: - Group verbal and written instruction to review types of environmental controls: home humidity, furnaces, filters, dust mite/pet prevention, HEPA vacuums. To discuss weather changes, air quality and the benefits of nasal washing.   Exacerbations: - Group verbal and written instruction to provide: warning signs, infection symptoms, calling MD promptly, preventive modes, and value of vaccinations. Review: effective airway clearance, coughing and/or vibration techniques. Create an Sport and exercise psychologist.   Oxygen: - Individual and group verbal and written instruction on oxygen therapy. Includes supplement oxygen, available portable oxygen systems, continuous and intermittent flow rates, oxygen safety, concentrators, and Medicare reimbursement for oxygen. Flowsheet Row Pulmonary Rehab from 12/10/2016 in [facility] Cardiac and Pulmonary Rehab  Date  11/18/16  Educator  LB  Instruction Review Code  2- meets goals/outcomes      Respiratory Medications: - Group verbal and written instruction to review medications for lung disease. Drug class, frequency, complications, importance of spacers, rinsing mouth after steroid MDI's, and proper cleaning methods for nebulizers. Flowsheet Row Pulmonary Rehab from 12/10/2016 in [facility] Cardiac and Pulmonary Rehab  Date  11/18/16  Educator  LB  Instruction Review Code  2- meets goals/outcomes      AED/CPR: - Group verbal and written instruction with the use of models to demonstrate the basic use of the AED with the basic ABC's of resuscitation.   Breathing Retraining: - Provides individuals verbal and written instruction on purpose, frequency, and proper technique of diaphragmatic breathing and pursed-lipped breathing. Applies individual practice skills. Flowsheet Row Pulmonary  Rehab from 12/10/2016 in [facility] Cardiac and Pulmonary Rehab  Date  11/18/16  Educator  LB  Instruction Review Code  2- meets goals/outcomes      Anatomy and Physiology of the Lungs: - Group verbal and written instruction with the use of models to provide basic lung anatomy and physiology related to function, structure and complications of lung disease.   Heart Failure: - Group verbal and written instruction on the basics of heart failure: signs/symptoms, treatments, explanation of ejection fraction, enlarged heart and cardiomyopathy.   Sleep Apnea: - Individual verbal and written instruction to review Obstructive Sleep Apnea. Review of risk factors, methods for diagnosing and types of masks and machines for OSA. Flowsheet Row Pulmonary Rehab from 12/10/2016 in [facility] Cardiac and Pulmonary Rehab  Date  11/18/16  Educator  LB  Instruction Review Code  2- meets goals/outcomes      Anxiety: - Provides group, verbal and written instruction on the correlation between heart/lung disease and anxiety, treatment options, and management of anxiety.   Relaxation: - Provides group, verbal and written instruction about the benefits of relaxation for patients with heart/lung disease. Also provides patients with examples of relaxation techniques. Flowsheet Row Pulmonary Rehab from 12/10/2016 in [facility] Cardiac and Pulmonary Rehab  Date  12/10/16  Educator  [facility]  Instruction Review Code  2- Meets goals/outcomes      Knowledge Questionnaire Score:     Knowledge Questionnaire Score - 11/18/16 1528      Knowledge Questionnaire Score   Pre Score 8/10       Core Components/Risk Factors/Patient Goals at Admission:     Personal Goals and Risk Factors at Admission - 11/18/16 1538      Core  Components/Risk Factors/Patient Goals on Admission   Sedentary Yes   Intervention Provide advice, education, support and counseling about physical activity/exercise needs.;Develop an individualized exercise  prescription for aerobic and resistive training based on initial evaluation findings, risk stratification, comorbidities and participant's personal goals.   Expected Outcomes Achievement of increased cardiorespiratory fitness and enhanced flexibility, muscular endurance and strength shown through measurements of functional capacity and personal statement of participant.   Increase Strength and Stamina Yes   Intervention Provide advice, education, support and counseling about physical activity/exercise needs.;Develop an individualized exercise prescription for aerobic and resistive training based on initial evaluation findings, risk stratification, comorbidities and participant's personal goals.   Expected Outcomes Achievement of increased cardiorespiratory fitness and enhanced flexibility, muscular endurance and strength shown through measurements of functional capacity and personal statement of participant.   Improve shortness of breath with ADL's Yes   Intervention Provide education, individualized exercise plan and daily activity instruction to help decrease symptoms of SOB with activities of daily living.   Expected Outcomes Short Term: Achieves a reduction of symptoms when performing activities of daily living.   Develop more efficient breathing techniques such as purse lipped breathing and diaphragmatic breathing; and practicing self-pacing with activity Yes   Intervention Provide education, demonstration and support about specific breathing techniuqes utilized for more efficient breathing. Include techniques such as pursed lipped breathing, diaphragmatic breathing and self-pacing activity.   Expected Outcomes Short Term: Participant will be able to demonstrate and use breathing techniques as needed throughout daily activities.   Increase knowledge of respiratory medications and ability to use respiratory devices properly  Yes  Spiriva, Advair, Albuterol, Spacer given; SVN Albuterol   Intervention  Provide education and demonstration as needed of appropriate use of medications, inhalers, and oxygen therapy.   Expected Outcomes Short Term: Achieves understanding of medications use. Understands that oxygen is a medication prescribed by physician. Demonstrates appropriate use of inhaler and oxygen therapy.   Diabetes Yes   Intervention Provide education about signs/symptoms and action to take for hypo/hyperglycemia.;Provide education about proper nutrition, including hydration, and aerobic/resistive exercise prescription along with prescribed medications to achieve blood glucose in normal ranges: Fasting glucose 65-99 mg/dL   Expected Outcomes Short Term: Participant verbalizes understanding of the signs/symptoms and immediate care of hyper/hypoglycemia, proper foot care and importance of medication, aerobic/resistive exercise and nutrition plan for blood glucose control.;Long Term: Attainment of HbA1C < 7%.   Heart Failure Yes   Intervention Provide a combined exercise and nutrition program that is supplemented with education, support and counseling about heart failure. Directed toward relieving symptoms such as shortness of breath, decreased exercise tolerance, and extremity edema.   Expected Outcomes Improve functional capacity of life   Hypertension Yes   Intervention Provide education on lifestyle modifcations including regular physical activity/exercise, weight management, moderate sodium restriction and increased consumption of fresh fruit, vegetables, and low fat dairy, alcohol moderation, and smoking cessation.;Monitor prescription use compliance.   Expected Outcomes Short Term: Continued assessment and intervention until BP is < 140/35m HG in hypertensive participants. < 130/856mHG in hypertensive participants with diabetes, heart failure or chronic kidney disease.;Long Term: Maintenance of blood pressure at goal levels.   Lipids Yes   Intervention Provide education and support for  participant on nutrition & aerobic/resistive exercise along with prescribed medications to achieve LDL '70mg'$ , HDL >'40mg'$ .   Expected Outcomes Short Term: Participant states understanding of desired cholesterol values and is compliant with medications prescribed. Participant is following exercise prescription and nutrition guidelines.;Long Term: Cholesterol  controlled with medications as prescribed, with individualized exercise RX and with personalized nutrition plan. Value goals: LDL < '70mg'$ , HDL > 40 mg.      Core Components/Risk Factors/Patient Goals Review:      Goals and Risk Factor Review    Row Name 11/26/16 1442             Core Components/Risk Factors/Patient Goals Review   Personal Goals Review Develop more efficient breathing techniques such as purse lipped breathing and diaphragmatic breathing and practicing self-pacing with activity.       Review Reviewed pursed lip breathing technique with Stanton Kidney today.       Expected Outcomes Short: Jasira will become more proficient at using PLB.  Long: Armandina will become independent in using PLB.          Core Components/Risk Factors/Patient Goals at Discharge (Final Review):      Goals and Risk Factor Review - 11/26/16 1442      Core Components/Risk Factors/Patient Goals Review   Personal Goals Review Develop more efficient breathing techniques such as purse lipped breathing and diaphragmatic breathing and practicing self-pacing with activity.   Review Reviewed pursed lip breathing technique with Stanton Kidney today.   Expected Outcomes Short: Laurence will become more proficient at using PLB.  Long: Michala will become independent in using PLB.      ITP Comments:     ITP Comments    Row Name 12/24/16 1046 12/30/16 1550 01/07/17 1445       ITP Comments Gerilynn called and said she is sorry that she is missing Pulmonary Rehab but that she still is sick with a cough. Aliene said she will see the doctor tomorrow.  Called Ms Utecht today and left a message;  last attended 12/10/16. Ms Reitan called 01/06/17 and stated she had been sick. She is planning to return to [place] this week. Last attended 12/10/16.        Comments: 30 Day Review

## 2017-01-20 ENCOUNTER — Encounter: Payer: Self-pay | Admitting: Internal Medicine

## 2017-01-20 ENCOUNTER — Ambulatory Visit (INDEPENDENT_AMBULATORY_CARE_PROVIDER_SITE_OTHER): Payer: Medicare Other | Admitting: Internal Medicine

## 2017-01-20 VITALS — BP 122/74 | HR 70 | Temp 98.0°F | Ht 64.5 in | Wt 145.8 lb

## 2017-01-20 DIAGNOSIS — E781 Pure hyperglyceridemia: Secondary | ICD-10-CM

## 2017-01-20 DIAGNOSIS — I10 Essential (primary) hypertension: Secondary | ICD-10-CM | POA: Diagnosis not present

## 2017-01-20 DIAGNOSIS — E119 Type 2 diabetes mellitus without complications: Secondary | ICD-10-CM

## 2017-01-20 DIAGNOSIS — F418 Other specified anxiety disorders: Secondary | ICD-10-CM

## 2017-01-20 DIAGNOSIS — J449 Chronic obstructive pulmonary disease, unspecified: Secondary | ICD-10-CM

## 2017-01-20 DIAGNOSIS — I5181 Takotsubo syndrome: Secondary | ICD-10-CM | POA: Diagnosis not present

## 2017-01-20 DIAGNOSIS — Z0000 Encounter for general adult medical examination without abnormal findings: Secondary | ICD-10-CM

## 2017-01-20 DIAGNOSIS — F329 Major depressive disorder, single episode, unspecified: Secondary | ICD-10-CM

## 2017-01-20 DIAGNOSIS — M792 Neuralgia and neuritis, unspecified: Secondary | ICD-10-CM

## 2017-01-20 DIAGNOSIS — F419 Anxiety disorder, unspecified: Secondary | ICD-10-CM

## 2017-01-20 DIAGNOSIS — F32A Depression, unspecified: Secondary | ICD-10-CM

## 2017-01-20 DIAGNOSIS — G4733 Obstructive sleep apnea (adult) (pediatric): Secondary | ICD-10-CM

## 2017-01-20 LAB — LIPID PANEL
CHOLESTEROL: 156 mg/dL (ref 0–200)
HDL: 82.9 mg/dL (ref >39.00)
LDL CALC: 61 mg/dL (ref 0–99)
NonHDL: 72.77
TRIGLYCERIDES: 59 mg/dL (ref 0.0–149.0)
Total CHOL/HDL Ratio: 2
VLDL: 11.8 mg/dL (ref 0.0–40.0)

## 2017-01-20 MED ORDER — PREGABALIN 50 MG PO CAPS: 50.0000 mg | ORAL_CAPSULE | Freq: Every day | ORAL | 2 refills | Status: DC

## 2017-01-20 NOTE — Progress Notes (Signed)
HPI:  Pt presents to the clnic today for her Medicare Wellness Exam. She is also due to follow up chronic conditions.  DM 2: Her last A1C was 6.4%, 11/2016. She is not checking her sugars. She takes Metformin as prescribed. Her last eye exam was 03/2016. She does have some neuropathic pain but as failed Neurontin in the past. She is interested in trying something different for this. She checks her feet daily.  Anxiety and Depression: Triggered by her claustrophobia. She takes Zoloft and Xanax as prescribed. She takes 1/4 tab of Xanax every morning, 1 tab at lunch, 1 tab [place], 1 tab before bed. She does follow with Dr. Laymond Purser. She denies SI/HI.  COPD: She has an occasional cough with intermittent shortness of breath. She is taking Spiriva, Advair and Ventolin as prescribed. She quit smoking with hypnotization. She is following with pulmonary rehab, now has oxygen to use with exertion. She follows with Dr. Kendrick Fries and Dr. Sung Amabile.  HLD: Her last LDL was 66, 10/2015. She is taking Crestor as prescribed. She denies myalgias. She does not consume a low fat diet.  HTN with Cardiomyopathy: Stress induced. She is taking Valsartan and Metoprolol as prescribed. Her BP today is 122/74. ECG form 02/2016 reviewed. She follows with Dr. Kirke Corin.  OSA: She is compliant with her CPAP machine. She averages 7 hours of sleep per night. She sometimes does not feel rested when she wakes up and will often nap during the day.  Past Medical History:  Diagnosis Date  . Anxiety   . Asthmatic bronchitis   . COPD (chronic obstructive pulmonary disease) (HCC)   . Depression   . Diabetes mellitus   . Diverticulitis   . Diverticulosis   . Hyperlipidemia   . Hypertension   . Osteopenia   . Sleep apnea   . Stress-induced cardiomyopathy    a. echo 09/28/2014: EF 45-50%, severe HK of mid-distal anterior, apical and mid-distal inferior walls suggestive of stress induced CM, mildly dilated LA,  mildly dilated PASP, mild TR, trivial pericardial effusion    Current Outpatient Prescriptions  Medication Sig Dispense Refill  . albuterol (PROVENTIL) (2.5 MG/3ML) 0.083% nebulizer solution Take 3 mLs (2.5 mg total) by nebulization every 6 (six) hours as needed for wheezing or shortness of breath. 75 mL 12  . ALPRAZolam (XANAX) 0.5 MG tablet TAKE 1 TABLET BY MOUTH EVERY 8 HOURS AS NEEDED 90 tablet 0  . Cholecalciferol (VITAMIN D-3) 1000 UNITS CAPS Take 2 capsules by mouth.     . Fluticasone-Salmeterol (ADVAIR DISKUS) 250-50 MCG/DOSE AEPB Inhale 1 puff into the lungs 2 (two) times daily. 60 each 10  . metFORMIN (GLUCOPHAGE) 500 MG tablet TAKE 1 TABLET BY MOUTH TWICE A DAY WITH A MEAL 180 tablet 3  . metoprolol succinate (TOPROL-XL) 25 MG 24 hr tablet Take 1 tablet (25 mg total) by mouth daily. 90 tablet 3  . ondansetron (ZOFRAN) 4 MG tablet Take 1 tablet (4 mg total) by mouth every 8 (eight) hours as needed. 20 tablet 0  . ONE TOUCH ULTRA TEST test strip TEST BLOOD SUGAR AT HOME TWICE A DAY 100 each 5  . OXYGEN-HELIUM IN Inhale 2 % into the lungs at bedtime.      . rosuvastatin (CRESTOR) 5 MG tablet Take 1 tablet (5 mg total) by mouth daily. 90 tablet 0  . sertraline (ZOLOFT) 100 MG tablet TAKE 1 AND 1/2 TABLET BY MOUTH TWICE A DAY 270 tablet 0  . SPIRIVA HANDIHALER  18 MCG inhalation capsule INHALE 1 CAPSULE VIA HANDIHALER DAILY 90 capsule 1  . valsartan (DIOVAN) 160 MG tablet Take 1 tablet (160 mg total) by mouth daily. 90 tablet 3  . VENTOLIN HFA 108 (90 Base) MCG/ACT inhaler INHALE TWO PUFFS INTO THE LUNGS EVERY FOUR HOURS AS NEEDED FOR WHEEZING 18 Inhaler 2   No current facility-administered medications for this visit.     Allergies  Allergen Reactions  . Amoxicillin-Pot Clavulanate     REACTION: stomach sensitive.  Lenice Llamas. Daliresp [Roflumilast]     Severe nausea   . Erythromycin Base     REACTION: GI upset  . Neurontin [Gabapentin]     anxiety    Family History  Problem Relation  Age of Onset  . Diabetes Daughter   . Kidney disease Daughter   . Lung cancer Mother     smoked  . COPD Mother     smoked  . COPD Father     smoked  . Prostate cancer Father   . Kidney failure Father   . Breast cancer Maternal Aunt   . Diabetes Maternal Grandmother   . Breast cancer Maternal Grandmother   . Cancer Cousin     colon    Social History   Social History  . Marital status: Married    Spouse name: N/A  . Number of children: N/A  . Years of education: N/A   Occupational History  . Not on file.   Social History Main Topics  . Smoking status: Former Smoker    Packs/day: 0.25    Years: 40.00    Types: Cigarettes    Quit date: 09/01/2016  . Smokeless tobacco: Never Used     Comment: Uses nicotrol inhaler on occasion  . Alcohol use No  . Drug use: No  . Sexual activity: Not Currently   Other Topics Concern  . Not on file   Social History Narrative   Lives with husband in [place]. 2 dogs. Works at [employer].    Hospitiliaztions: None  Health Maintenance:    Flu: 08/2016  Tetanus: 2009  Pneumovax: 09/2012  Prevnar: 05/2014  Zostavax: 02/2010  Mammogram: 05/2016  Pap Smear: 10/2013  Bone Density: 05/2013  Colon Screening: 4,2010, polyps (5 years)  Eye Doctor: 03/2016  Dental Exam: biannually   Providers:   PCP: Nicki Reaperegina Sreya Froio, NP-C  Cardiologist: Dr. Kirke CorinArida  Pulmonologist: Dr. Kendrick FriesMcQuaid   I have personally reviewed and have noted:  1. The patient's medical and social history 2. Their use of alcohol, tobacco or illicit drugs 3. Their current medications and supplements 4. The patient's functional ability including ADL's, fall risks, home safety risks and hearing or visual impairment. 5. Diet and physical activities 6. Evidence for depression or mood disorder  Subjective:   Review of Systems:   Constitutional: Denies fever, malaise, fatigue, headache or abrupt weight changes.  HEENT: Denies eye pain, eye redness, ear pain, ringing in the ears, wax  buildup, runny nose, nasal congestion, bloody nose, or sore throat. Respiratory: Pt reports intermittent cough and shortness of breath. Denies difficulty breathing, or sputum production.   Cardiovascular: Denies chest pain, chest tightness, palpitations or swelling in the hands or feet.  Gastrointestinal: Denies abdominal pain, bloating, constipation, diarrhea or blood in the stool.  GU: Denies urgency, frequency, pain with urination, burning sensation, blood in urine, odor or discharge. Musculoskeletal: Denies decrease in range of motion, difficulty with gait, muscle pain or joint pain and swelling.  Skin: Denies redness, rashes, lesions or ulcercations.  Neurological: Pt reports difficulty with recall. Denies dizziness, difficulty with speech or problems with balance and coordination.  Psych: Pt has history of anxiety and depression. Denies SI/HI.  No other specific complaints in a complete review of systems (except as listed in HPI above).  Objective:  PE:   BP 122/74   Pulse 70   Temp 98 F (36.7 C) (Oral)   Ht 5' 4.5" (1.638 m)   Wt 145 lb 12 oz (66.1 kg)   SpO2 94%   BMI 24.63 kg/m   Wt Readings from Last 3 Encounters:  12/12/16 147 lb (66.7 kg)  11/18/16 142 lb 11.2 oz (64.7 kg)  10/31/16 143 lb (64.9 kg)    General: Appears her stated age, well developed, well nourished in NAD. Cardiovascular: Normal rate and rhythm. S1,S2 noted.  No murmur, rubs or gallops noted.  No carotid bruits noted. Pulmonary/Chest: Normal effort and positive vesicular breath sounds. No respiratory distress. No wheezes, rales or ronchi noted.  Neurological: Alert and oriented. Psychiatric: Mood and affect normal. Behavior is normal. Judgment and thought content normal.     BMET    Component Value Date/Time   NA 138 10/31/2016 1622   NA 140 09/28/2014 0637   K 4.2 10/31/2016 1622   K 4.3 09/28/2014 0637   CL 99 10/31/2016 1622   CL 108 (H) 09/28/2014 0637   CO2 23 10/31/2016 1622   CO2  26 09/28/2014 0637   GLUCOSE 111 (H) 10/31/2016 1622   GLUCOSE 117 (H) 09/28/2014 0637   BUN 14 10/31/2016 1622   BUN 13 09/28/2014 0637   CREATININE 0.82 10/31/2016 1622   CALCIUM 10.2 10/31/2016 1622   CALCIUM 7.7 (L) 09/28/2014 0637   GFRNONAA >60 09/28/2014 0637   GFRAA >60 09/28/2014 0637    Lipid Panel     Component Value Date/Time   CHOL 150 10/17/2015 1138   CHOL 110 09/28/2014 0637   TRIG 83.0 10/17/2015 1138   TRIG 102 09/28/2014 0637   HDL 66.60 10/17/2015 1138   HDL 54 09/28/2014 0637   CHOLHDL 2 10/17/2015 1138   VLDL 16.6 10/17/2015 1138   VLDL 20 09/28/2014 0637   LDLCALC 66 10/17/2015 1138   LDLCALC 36 09/28/2014 0637    CBC    Component Value Date/Time   WBC 6.3 10/31/2016 1622   RBC 4.66 10/31/2016 1622   HGB 13.6 10/31/2016 1622   HGB 13.0 09/30/2014 0525   HCT 41.5 10/31/2016 1622   HCT 39.0 09/30/2014 0525   PLT 292 10/31/2016 1622   PLT 234 09/30/2014 0525   MCV 89.1 10/31/2016 1622   MCV 93 09/30/2014 0525   MCH 29.2 10/31/2016 1622   MCHC 32.8 10/31/2016 1622   RDW 14.1 10/31/2016 1622   RDW 14.1 09/30/2014 0525   LYMPHSABS 1,260 10/31/2016 1622   LYMPHSABS 1.3 09/30/2014 0525   MONOABS 693 10/31/2016 1622   MONOABS 0.5 09/30/2014 0525   EOSABS 189 10/31/2016 1622   EOSABS 0.2 09/30/2014 0525   BASOSABS 0 10/31/2016 1622   BASOSABS 0.0 09/30/2014 0525    Hgb A1C Lab Results  Component Value Date   HGBA1C 6.4 11/04/2016      Assessment and Plan:   Medicare Annual Wellness Visit:  Diet: She does not eat a lot of meat. She consumes fruits and veggies daily. She does eat some fried food. She drinks mostly water. Physical activity: Pulmonary rehab 2 x week. Depression/mood screen: Negative Hearing: Intact to whispered voice Visual acuity: Grossly normal, performs  annual eye exam  ADLs: Capable Fall risk: None Home safety: Good Cognitive evaluation: Intact to orientation, naming, recall and repetition EOL planning: Adv  directives, full code/ I agree  Preventative Medicine: Flu, tetanus, pneumovax and prevnar UTD. Mammogram UTD. Pap smear UTD. Bone density UTD. Colon screening UTD. Encouraged her to consume a balanced diet and exercise regimen. Advised her to see an eye doctor and dentist annually. Labs reviewed. Lipid profile today.   Next appointment: 1 year, annual exam  Neuropathic Pain:  Will trial Lyrica, RX sent to pharmacy   [name], NP

## 2017-01-20 NOTE — Assessment & Plan Note (Signed)
Lipid profile today Encouraged her to consume a low fat diet Continue Crestor

## 2017-01-20 NOTE — Assessment & Plan Note (Signed)
Controlled on Lisinopril and Metoprolol Will monitor 

## 2017-01-20 NOTE — Assessment & Plan Note (Signed)
Continue Lisinopril and Metoprolol She will continue to follow with Dr. Kirke CorinArida

## 2017-01-20 NOTE — Assessment & Plan Note (Signed)
A1C at goal Continue Metformin Eye exam UTD Foot exam today No microalbumin secondary to ACEI therapy Encouraged her to consume a low fat, low carb diet

## 2017-01-20 NOTE — Assessment & Plan Note (Signed)
Advised her to continue wearing CPAP at night Advised her to try not to nap during the day

## 2017-01-20 NOTE — Assessment & Plan Note (Signed)
Congratulated her on smoking cessation Encouraged her to continue inhalers as prescribed She will finish out pulmonary rehab Continue prn oxygen

## 2017-01-20 NOTE — Patient Instructions (Signed)
Health Maintenance, Female Adopting a healthy lifestyle and getting preventive care can go a long way to promote health and wellness. Talk with your health care provider about what schedule of regular examinations is right for you. This is a good chance for you to check in with your provider about disease prevention and staying healthy. In between checkups, there are plenty of things you can do on your own. Experts have done a lot of research about which lifestyle changes and preventive measures are most likely to keep you healthy. Ask your health care provider for more information. Weight and diet Eat a healthy diet  Be sure to include plenty of vegetables, fruits, low-fat dairy products, and lean protein.  Do not eat a lot of foods high in solid fats, added sugars, or salt.  Get regular exercise. This is one of the most important things you can do for your health.  Most adults should exercise for at least 150 minutes each week. The exercise should increase your heart rate and make you sweat (moderate-intensity exercise).  Most adults should also do strengthening exercises at least twice a week. This is in addition to the moderate-intensity exercise. Maintain a healthy weight  Body mass index (BMI) is a measurement that can be used to identify possible weight problems. It estimates body fat based on height and weight. Your health care provider can help determine your BMI and help you achieve or maintain a healthy weight.  For females 76 years of age and older:  A BMI below 18.5 is considered underweight.  A BMI of 18.5 to 24.9 is normal.  A BMI of 25 to 29.9 is considered overweight.  A BMI of 30 and above is considered obese. Watch levels of cholesterol and blood lipids  You should start having your blood tested for lipids and cholesterol at 72 years of age, then have this test every 5 years.  You may need to have your cholesterol levels checked more often if:  Your lipid or  cholesterol levels are high.  You are older than 72 years of age.  You are at high risk for heart disease. Cancer screening Lung Cancer  Lung cancer screening is recommended for adults 64-42 years old who are at high risk for lung cancer because of a history of smoking.  A yearly low-dose CT scan of the lungs is recommended for people who:  Currently smoke.  Have quit within the past 15 years.  Have at least a 30-pack-year history of smoking. A pack year is smoking an average of one pack of cigarettes a day for 1 year.  Yearly screening should continue until it has been 15 years since you quit.  Yearly screening should stop if you develop a health problem that would prevent you from having lung cancer treatment. Breast Cancer  Practice breast self-awareness. This means understanding how your breasts normally appear and feel.  It also means doing regular breast self-exams. Let your health care provider know about any changes, no matter how small.  If you are in your 20s or 30s, you should have a clinical breast exam (CBE) by a health care provider every 1-3 years as part of a regular health exam.  If you are 34 or older, have a CBE every year. Also consider having a breast X-ray (mammogram) every year.  If you have a family history of breast cancer, talk to your health care provider about genetic screening.  If you are at high risk for breast cancer, talk  to your health care provider about having an MRI and a mammogram every year.  Breast cancer gene (BRCA) assessment is recommended for women who have family members with BRCA-related cancers. BRCA-related cancers include:  Breast.  Ovarian.  Tubal.  Peritoneal cancers.  Results of the assessment will determine the need for genetic counseling and BRCA1 and BRCA2 testing. Cervical Cancer  Your health care provider may recommend that you be screened regularly for cancer of the pelvic organs (ovaries, uterus, and vagina).  This screening involves a pelvic examination, including checking for microscopic changes to the surface of your cervix (Pap test). You may be encouraged to have this screening done every 3 years, beginning at age 24.  For women ages 66-65, health care providers may recommend pelvic exams and Pap testing every 3 years, or they may recommend the Pap and pelvic exam, combined with testing for human papilloma virus (HPV), every 5 years. Some types of HPV increase your risk of cervical cancer. Testing for HPV may also be done on women of any age with unclear Pap test results.  Other health care providers may not recommend any screening for nonpregnant women who are considered low risk for pelvic cancer and who do not have symptoms. Ask your health care provider if a screening pelvic exam is right for you.  If you have had past treatment for cervical cancer or a condition that could lead to cancer, you need Pap tests and screening for cancer for at least 20 years after your treatment. If Pap tests have been discontinued, your risk factors (such as having a new sexual partner) need to be reassessed to determine if screening should resume. Some women have medical problems that increase the chance of getting cervical cancer. In these cases, your health care provider may recommend more frequent screening and Pap tests. Colorectal Cancer  This type of cancer can be detected and often prevented.  Routine colorectal cancer screening usually begins at 72 years of age and continues through 72 years of age.  Your health care provider may recommend screening at an earlier age if you have risk factors for colon cancer.  Your health care provider may also recommend using home test kits to check for hidden blood in the stool.  A small camera at the end of a tube can be used to examine your colon directly (sigmoidoscopy or colonoscopy). This is done to check for the earliest forms of colorectal cancer.  Routine  screening usually begins at age 41.  Direct examination of the colon should be repeated every 5-10 years through 72 years of age. However, you may need to be screened more often if early forms of precancerous polyps or small growths are found. Skin Cancer  Check your skin from head to toe regularly.  Tell your health care provider about any new moles or changes in moles, especially if there is a change in a mole's shape or color.  Also tell your health care provider if you have a mole that is larger than the size of a pencil eraser.  Always use sunscreen. Apply sunscreen liberally and repeatedly throughout the day.  Protect yourself by wearing long sleeves, pants, a wide-brimmed hat, and sunglasses whenever you are outside. Heart disease, diabetes, and high blood pressure  High blood pressure causes heart disease and increases the risk of stroke. High blood pressure is more likely to develop in:  People who have blood pressure in the high end of the normal range (130-139/85-89 mm Hg).  People who are overweight or obese.  People who are African American.  If you are 59-24 years of age, have your blood pressure checked every 3-5 years. If you are 34 years of age or older, have your blood pressure checked every year. You should have your blood pressure measured twice-once when you are at a hospital or clinic, and once when you are not at a hospital or clinic. Record the average of the two measurements. To check your blood pressure when you are not at a hospital or clinic, you can use:  An automated blood pressure machine at a pharmacy.  A home blood pressure monitor.  If you are between 29 years and 60 years old, ask your health care provider if you should take aspirin to prevent strokes.  Have regular diabetes screenings. This involves taking a blood sample to check your fasting blood sugar level.  If you are at a normal weight and have a low risk for diabetes, have this test once  every three years after 72 years of age.  If you are overweight and have a high risk for diabetes, consider being tested at a younger age or more often. Preventing infection Hepatitis B  If you have a higher risk for hepatitis B, you should be screened for this virus. You are considered at high risk for hepatitis B if:  You were born in a country where hepatitis B is common. Ask your health care provider which countries are considered high risk.  Your parents were born in a high-risk country, and you have not been immunized against hepatitis B (hepatitis B vaccine).  You have HIV or AIDS.  You use needles to inject street drugs.  You live with someone who has hepatitis B.  You have had sex with someone who has hepatitis B.  You get hemodialysis treatment.  You take certain medicines for conditions, including cancer, organ transplantation, and autoimmune conditions. Hepatitis C  Blood testing is recommended for:  Everyone born from [birth year] through 1966.  Anyone with known risk factors for hepatitis C. Sexually transmitted infections (STIs)  You should be screened for sexually transmitted infections (STIs) including gonorrhea and chlamydia if:  You are sexually active and are younger than 72 years of age.  You are older than 72 years of age and your health care provider tells you that you are at risk for this type of infection.  Your sexual activity has changed since you were last screened and you are at an increased risk for chlamydia or gonorrhea. Ask your health care provider if you are at risk.  If you do not have HIV, but are at risk, it may be recommended that you take a prescription medicine daily to prevent HIV infection. This is called pre-exposure prophylaxis (PrEP). You are considered at risk if:  You are sexually active and do not regularly use condoms or know the HIV status of your partner(s).  You take drugs by injection.  You are sexually active with a partner  who has HIV. Talk with your health care provider about whether you are at high risk of being infected with HIV. If you choose to begin PrEP, you should first be tested for HIV. You should then be tested every 3 months for as long as you are taking PrEP. Pregnancy  If you are premenopausal and you may become pregnant, ask your health care provider about preconception counseling.  If you may become pregnant, take 400 to 800 micrograms (mcg) of folic acid  every day.  If you want to prevent pregnancy, talk to your health care provider about birth control (contraception). Osteoporosis and menopause  Osteoporosis is a disease in which the bones lose minerals and strength with aging. This can result in serious bone fractures. Your risk for osteoporosis can be identified using a bone density scan.  If you are 4 years of age or older, or if you are at risk for osteoporosis and fractures, ask your health care provider if you should be screened.  Ask your health care provider whether you should take a calcium or vitamin D supplement to lower your risk for osteoporosis.  Menopause may have certain physical symptoms and risks.  Hormone replacement therapy may reduce some of these symptoms and risks. Talk to your health care provider about whether hormone replacement therapy is right for you. Follow these instructions at home:  Schedule regular health, dental, and eye exams.  Stay current with your immunizations.  Do not use any tobacco products including cigarettes, chewing tobacco, or electronic cigarettes.  If you are pregnant, do not drink alcohol.  If you are breastfeeding, limit how much and how often you drink alcohol.  Limit alcohol intake to no more than 1 drink per day for nonpregnant women. One drink equals 12 ounces of beer, 5 ounces of wine, or 1 ounces of hard liquor.  Do not use street drugs.  Do not share needles.  Ask your health care provider for help if you need support  or information about quitting drugs.  Tell your health care provider if you often feel depressed.  Tell your health care provider if you have ever been abused or do not feel safe at home. This information is not intended to replace advice given to you by your health care provider. Make sure you discuss any questions you have with your health care provider. Document Released: 05/05/2011 Document Revised: 03/27/2016 Document Reviewed: 07/24/2015 Elsevier Interactive Patient Education  2017 [employer].

## 2017-01-20 NOTE — Assessment & Plan Note (Signed)
Chronic but stable on Zoloft and Xanax Will monitor

## 2017-01-27 ENCOUNTER — Telehealth: Payer: Self-pay | Admitting: Respiratory Therapy

## 2017-01-27 ENCOUNTER — Encounter: Payer: Self-pay | Admitting: Respiratory Therapy

## 2017-01-27 DIAGNOSIS — J449 Chronic obstructive pulmonary disease, unspecified: Secondary | ICD-10-CM

## 2017-01-27 NOTE — Telephone Encounter (Signed)
Called Tami Skinner - last attended 12/10/16. Left message in regard to her glucose charges, and if she plans to return to [facility].

## 2017-02-02 ENCOUNTER — Encounter: Payer: Medicare Other | Attending: Pulmonary Disease

## 2017-02-02 DIAGNOSIS — J449 Chronic obstructive pulmonary disease, unspecified: Secondary | ICD-10-CM | POA: Insufficient documentation

## 2017-02-04 ENCOUNTER — Encounter: Payer: Self-pay | Admitting: Respiratory Therapy

## 2017-02-04 DIAGNOSIS — J449 Chronic obstructive pulmonary disease, unspecified: Secondary | ICD-10-CM

## 2017-02-04 NOTE — Progress Notes (Signed)
Pulmonary Individual Treatment Plan  Patient Details  Name: RAYSSA ATHA MRN: [id number] Date of Birth: April 25, 1946 Referring Provider:     Pulmonary Rehab from 11/18/2016 in [facility] Cardiac and Pulmonary Rehab  Referring Provider  Simonds      Initial Encounter Date:    Pulmonary Rehab from 11/18/2016 in [place] Cardiac and Pulmonary Rehab  Date  11/18/16  Referring Provider  Simonds      Visit Diagnosis: Chronic obstructive pulmonary disease, unspecified COPD type ([place])  Patient's Home Medications on Admission:  Current Outpatient Prescriptions:    albuterol (PROVENTIL) (2.5 MG/3ML) 0.083% nebulizer solution, Take 3 mLs (2.5 mg total) by nebulization every 6 (six) hours as needed for wheezing or shortness of breath., Disp: 75 mL, Rfl: 12   ALPRAZolam (XANAX) 0.5 MG tablet, TAKE 1 TABLET BY MOUTH EVERY 8 HOURS AS NEEDED, Disp: 90 tablet, Rfl: 0   Cholecalciferol (VITAMIN D-3) 1000 UNITS CAPS, Take 2 capsules by mouth. , Disp: , Rfl:    Fluticasone-Salmeterol (ADVAIR DISKUS) 250-50 MCG/DOSE AEPB, Inhale 1 puff into the lungs 2 (two) times daily., Disp: 60 each, Rfl: 10   metFORMIN (GLUCOPHAGE) 500 MG tablet, TAKE 1 TABLET BY MOUTH TWICE A DAY WITH A MEAL, Disp: 180 tablet, Rfl: 3   metoprolol succinate (TOPROL-XL) 25 MG 24 hr tablet, Take 1 tablet (25 mg total) by mouth daily., Disp: 90 tablet, Rfl: 3   ondansetron (ZOFRAN) 4 MG tablet, Take 1 tablet (4 mg total) by mouth every 8 (eight) hours as needed., Disp: 20 tablet, Rfl: 0   ONE TOUCH ULTRA TEST test strip, TEST BLOOD SUGAR AT HOME TWICE A DAY, Disp: 100 each, Rfl: 5   OXYGEN-HELIUM IN, Inhale 2 % into the lungs at bedtime.  , Disp: , Rfl:    pregabalin (LYRICA) 50 MG capsule, Take 1 capsule (50 mg total) by mouth at bedtime., Disp: 30 capsule, Rfl: 2   rosuvastatin (CRESTOR) 5 MG tablet, Take 1 tablet (5 mg total) by mouth daily., Disp: 90 tablet, Rfl: 0   sertraline (ZOLOFT) 100 MG tablet, TAKE 1 AND 1/2 TABLET BY MOUTH  TWICE A DAY, Disp: 270 tablet, Rfl: 0   SPIRIVA HANDIHALER 18 MCG inhalation capsule, INHALE 1 CAPSULE VIA HANDIHALER DAILY, Disp: 90 capsule, Rfl: 1   valsartan (DIOVAN) 160 MG tablet, Take 1 tablet (160 mg total) by mouth daily., Disp: 90 tablet, Rfl: 3   VENTOLIN HFA 108 (90 Base) MCG/ACT inhaler, INHALE TWO PUFFS INTO THE LUNGS EVERY FOUR HOURS AS NEEDED FOR WHEEZING, Disp: 18 Inhaler, Rfl: 2  Past Medical History: Past Medical History:  Diagnosis Date   Anxiety    Asthmatic bronchitis    COPD (chronic obstructive pulmonary disease) (HCC)    Depression    Diabetes mellitus    Diverticulitis    Diverticulosis    Hyperlipidemia    Hypertension    Osteopenia    Sleep apnea    Stress-induced cardiomyopathy    a. echo 09/28/2014: EF 45-50%, severe HK of mid-distal anterior, apical and mid-distal inferior walls suggestive of stress induced CM, mildly dilated LA, mildly dilated PASP, mild TR, trivial pericardial effusion    Tobacco Use: History  Smoking Status   Former Smoker   Packs/day: 0.25   Years: 40.00   Types: Cigarettes   Quit date: 09/01/2016  Smokeless Tobacco   Never Used    Comment: Uses nicotrol inhaler on occasion    Labs: Recent Review Flowsheet Data    Labs for ITP Cardiac and Pulmonary  Rehab Latest Ref Rng & Units 01/09/2015 10/17/2015 05/20/2016 11/04/2016 01/20/2017   Cholestrol 0 - 200 mg/dL 141 150 - - 156   LDLCALC 0 - 99 mg/dL 42 66 - - 61   LDLDIRECT mg/dL - - - - -   HDL >39.00 mg/dL 61.40 66.60 - - 82.90   Trlycerides 0.0 - 149.0 mg/dL 188.0(H) 83.0 - - 59.0   Hemoglobin A1c 4.6 - 6.5 % 6.6(H) 6.7(H) 6.2 6.4 -       ADL UCSD:     Pulmonary Assessment Scores    Row Name 11/18/16 1531         ADL UCSD   ADL Phase Entry     SOB Score total 76     Rest 1     Walk 3     Stairs 5     Bath 3     Dress 2     Shop 5        Pulmonary Function Assessment:     Pulmonary Function Assessment - 11/18/16 1530       Pulmonary Function Tests   RV% 85 %   DLCO% 35 %     Initial Spirometry Results   FVC% 64 %   FEV1% 42 %   FEV1/FVC Ratio 50   Comments Test date 11/11/16     Post Bronchodilator Spirometry Results   FVC% 66 %   FEV1% 44 %   FEV1/FVC Ratio 51     Breath   Bilateral Breath Sounds Clear;Decreased   Shortness of Breath Yes;Limiting activity;Fear of Shortness of Breath      Exercise Target Goals:    Exercise Program Goal: Individual exercise prescription set with THRR, safety & activity barriers. Participant demonstrates ability to understand and report RPE using BORG scale, to self-measure pulse accurately, and to acknowledge the importance of the exercise prescription.  Exercise Prescription Goal: Starting with aerobic activity 30 plus minutes a day, 3 days per week for initial exercise prescription. Provide home exercise prescription and guidelines that participant acknowledges understanding prior to discharge.  Activity Barriers & Risk Stratification:     Activity Barriers & Cardiac Risk Stratification - 11/18/16 1528      Activity Barriers & Cardiac Risk Stratification   Activity Barriers Arthritis;Shortness of Breath;Deconditioning;Muscular Weakness;History of Falls   Cardiac Risk Stratification Moderate      6 Minute Walk:     6 Minute Walk    Row Name 11/18/16 1552         6 Minute Walk   Phase Initial     Distance 840 feet     Walk Time 6 minutes     # of Rest Breaks 0     MPH 1.6     METS 2.23     RPE 13     Perceived Dyspnea  3     VO2 Peak 7.27     Symptoms No     Resting HR 66 bpm     Resting BP 126/66     Max Ex. HR 90 bpm     Max Ex. BP 122/68       Interval HR   Baseline HR 66     1 Minute HR 78     2 Minute HR 82     3 Minute HR 86     4 Minute HR 90     5 Minute HR 82     2 Minute Post HR 65     Interval Heart  Rate? Yes       Interval Oxygen   Interval Oxygen? Yes     Baseline Oxygen Saturation % 96 %     Baseline Liters of  Oxygen 2 L     1 Minute Oxygen Saturation % 92 %     1 Minute Liters of Oxygen 2 L     2 Minute Oxygen Saturation % 93 %     2 Minute Liters of Oxygen 2 L     3 Minute Oxygen Saturation % 94 %     3 Minute Liters of Oxygen 2 L     4 Minute Oxygen Saturation % 92 %     4 Minute Liters of Oxygen 2 L     5 Minute Oxygen Saturation % 93 %     5 Minute Liters of Oxygen 2 L     6 Minute Oxygen Saturation % 93 %     6 Minute Liters of Oxygen 2 L     2 Minute Post Oxygen Saturation % 96 %     2 Minute Post Liters of Oxygen 2 L       Oxygen Initial Assessment:   Oxygen Re-Evaluation:   Oxygen Discharge (Final Oxygen Re-Evaluation):   Initial Exercise Prescription:     Initial Exercise Prescription - 11/18/16 1500      Date of Initial Exercise RX and Referring Provider   Date 11/18/16   Referring Provider Simonds     Oxygen   Oxygen Continuous   Liters 2     Treadmill   MPH 1.4   Grade 0.5   Minutes 15   METs 2.07     NuStep   Level 2   Minutes 15   METs 2     Biostep-RELP   Level 2   Minutes 15   METs 2     Prescription Details   Frequency (times per week) 3   Duration Progress to 45 minutes of aerobic exercise without signs/symptoms of physical distress     Intensity   THRR 40-80% of Max Heartrate 99-132   Ratings of Perceived Exertion 11-13   Perceived Dyspnea 0-4     Progression   Progression Continue to progress workloads to maintain intensity without signs/symptoms of physical distress.     Resistance Training   Training Prescription Yes   Weight 2   Reps 10-15      Perform Capillary Blood Glucose checks as needed.  Exercise Prescription Changes:     Exercise Prescription Changes    Row Name 11/26/16 1400 12/03/16 1400 12/17/16 1600         Response to Exercise   Blood Pressure (Admit) 120/64 126/70 134/80     Blood Pressure (Exercise) 100/60 128/70 126/64     Blood Pressure (Exit) 84/52  water recheck 112/56 seated and 96/56, 98/58  standing 108/60 124/60     Heart Rate (Admit) 76 bpm 77 bpm  --     Heart Rate (Exercise) 78 bpm 80 bpm 84 bpm     Heart Rate (Exit) 70 bpm 71 bpm 90 bpm     Oxygen Saturation (Admit) 91 % 95 %  --     Oxygen Saturation (Exercise) 93 % 94 %  --     Oxygen Saturation (Exit) 92 % 95 % 92 %     Rating of Perceived Exertion (Exercise) _0 Perceived Dyspnea (Exercise) _1 Symptoms dizziness at the  end none none     Duration Progress to 45 minutes of aerobic exercise without signs/symptoms of physical distress Progress to 45 minutes of aerobic exercise without signs/symptoms of physical distress Progress to 45 minutes of aerobic exercise without signs/symptoms of physical distress     Intensity THRR unchanged THRR unchanged THRR unchanged       Progression   Progression Continue to progress workloads to maintain intensity without signs/symptoms of physical distress. Continue to progress workloads to maintain intensity without signs/symptoms of physical distress. Continue to progress workloads to maintain intensity without signs/symptoms of physical distress.     Average METs 1.8 1.97 2       Resistance Training   Training Prescription Yes Yes Yes     Weight Greaen Band Yellow Band Green Band     Reps 10-15 10-15 10-15       Interval Training   Interval Training  -- No No       Oxygen   Oxygen Continuous Continuous Continuous     Liters _0 Treadmill   MPH 1 1  --     Grade 0.5 0.5  --     Minutes 15 15  --     METs 1.83 1.83  --       NuStep   Level _1 Minutes _2 METs 1.8 2.1 2       Biostep-RELP   Level  -- 1 3     Minutes  -- 15 15     METs  -- 2 2       Exercise Review   Progression --  First Full Day of Exercise Yes Yes        Exercise Comments:     Exercise Comments    Row Name 11/26/16 1443 12/03/16 1359 12/17/16 1601       Exercise Comments First full day of exercise!  Patient was oriented to gym and equipment  including functions, settings, policies, and procedures.  Patient's individual exercise prescription and treatment plan were reviewed.  All starting workloads were established based on the results of the 6 minute walk test done at initial orientation visit.  The plan for exercise progression was also introduced and progression will be customized based on patient's performance and goals. Nhu is off to a good start in exercise.  She has gotten three full days of exercise.  Harshita is doing well on all of her equipment, we will continue to monitor her progression. Ronetta has only attended once since last review.  She is only attending on Wednesdays and Fridays.  We will continue to to monitor her progression.        Exercise Goals and Review:   Exercise Goals Re-Evaluation :   Discharge Exercise Prescription (Final Exercise Prescription Changes):     Exercise Prescription Changes - 12/17/16 1600      Response to Exercise   Blood Pressure (Admit) 134/80   Blood Pressure (Exercise) 126/64   Blood Pressure (Exit) 124/60   Heart Rate (Exercise) 84 bpm   Heart Rate (Exit) 90 bpm   Oxygen Saturation (Exit) 92 %   Rating of Perceived Exertion (Exercise) 13   Perceived Dyspnea (Exercise) 3   Symptoms none   Duration Progress to 45 minutes of aerobic exercise without signs/symptoms of physical distress   Intensity THRR unchanged     Progression   Progression Continue to  progress workloads to maintain intensity without signs/symptoms of physical distress.   Average METs 2     Resistance Training   Training Prescription Yes   Weight Green Band   Reps 10-15     Interval Training   Interval Training No     Oxygen   Oxygen Continuous   Liters 2     NuStep   Level 2   Minutes 15   METs 2     Biostep-RELP   Level 3   Minutes 15   METs 2     Exercise Review   Progression Yes      Nutrition:  Target Goals: Understanding of nutrition guidelines, daily intake of sodium <1541m,  cholesterol <2040m calories 30% from fat and 7% or less from saturated fats, daily to have 5 or more servings of fruits and vegetables.  Biometrics:     Pre Biometrics - 11/18/16 1552      Pre Biometrics   Height _0  (1.651 m)   Weight 142 lb 11.2 oz (64.7 kg)   Waist Circumference 34 inches   Hip Circumference 40.5 inches   Waist to Hip Ratio 0.84 %   BMI (Calculated) 23.8       Nutrition Therapy Plan and Nutrition Goals:   Nutrition Discharge: Rate Your Plate Scores:   Nutrition Goals Re-Evaluation:   Nutrition Goals Discharge (Final Nutrition Goals Re-Evaluation):   Psychosocial: Target Goals: Acknowledge presence or absence of significant depression and/or stress, maximize coping skills, provide positive support system. Participant is able to verbalize types and ability to use techniques and skills needed for reducing stress and depression.   Initial Review & Psychosocial Screening:     Initial Psych Review & Screening - 11/18/16 1541      Family Dynamics   Comments Ms WaVokesas much stress in her life - her husband is an alcoholic, her daughter died in 2002-19-09and her daughter's son has a deformed arm and emotional concerns. Ms WaGasparrolso has issues with COPD and how is has changed her life. She has meet with Dr PeRexene [name] few times. [place].     Barriers   Psychosocial barriers to participate in program Psychosocial barriers identified (see note);The patient should benefit from training in stress management and relaxation.     Screening Interventions   Interventions Program counselor consult;Encouraged to exercise      Quality of Life Scores:     Quality of Life - 11/18/16 1548      Quality of Life Scores   Health/Function Pre 20.5 %   Socioeconomic Pre 20.43 %   Psych/Spiritual Pre 20.86 %   Family Pre 20.6 %   GLOBAL Pre 20.57 %      PHQ-9: Recent Review Flowsheet Data     [facility] 01/20/2017 11/18/2016 08/05/2016 05/20/2016 05/20/2016   Decreased Interest 2 2 0 0 0   Down, Depressed, Hopeless _1 0 0   PHQ - 2 Score _2 0 0   Altered sleeping 2 2 - - -   Tired, decreased energy 2 3 - - -   Change in appetite 1 0 - - -   Feeling bad or failure about yourself  - 0 - - -   Trouble concentrating 0 1 - - -   Moving slowly or fidgety/restless 0 0 - - -   Suicidal thoughts 0 0 - - -  PHQ-9 Score 9 10 - - -   Difficult doing work/chores - Somewhat difficult - - -     Interpretation of Total Score  Total Score Depression Severity:  1-4 = Minimal depression, 5-9 = Mild depression, 10-14 = Moderate depression, 15-19 = Moderately severe depression, 20-27 = Severe depression   Psychosocial Evaluation and Intervention:     Psychosocial Evaluation - 12/10/16 1229      Psychosocial Evaluation & Interventions   Interventions Encouraged to exercise with the program and follow exercise prescription;Relaxation education;Stress management education   Comments Counselor met with Ms. Hiemstra today for initial psychosocial evaluation.  She is a 71 year old who has COPD.  She has a limited support system living with an alcoholic spouse.  She relies some on her grandson who is close by and her daughter is her daily support by phone since she lives in [place].  Ms. Viona Gilmore states she is sleeping well although it is 2-10AM - it is "still 8 hours."  She states her appetite has increased significantly since she stropped smoking in October .  She has a 20 year history of depression and anxiety and states her medications manage this currently for the most part; although she continues to [place]  She has seen a counselor regularly and is followed on her medications by a PA currently.  Ms. Viona Gilmore states the stress in her life is mostly living with an alcoholic and coping with that, as well as her own health.  She attended the Relaxation  education today and reported she needs to learn to relax more and worry less.  Her goals for this program are to learn to relax and to breathe better in general.  Counselor will be providing Ms. W a list of local support groups that may help her cope and feel less isolated.  Counselor and staff will be following with Ms. W throughout the course of this program.     Continue Psychosocial Services  Yes  Follow on mood; anxiety and possible support groups.        Psychosocial Re-Evaluation:     Psychosocial Re-Evaluation    [place] Name 12/24/16 1046             Psychosocial Re-Evaluation   Comments Atiana called and said she is sorry that she is missing Pulmonary Rehab but that she still is sick with a cough. Laiylah said she will see the doctor tomorrow.        Interventions Encouraged to attend Pulmonary Rehabilitation for the exercise          Psychosocial Discharge (Final Psychosocial Re-Evaluation):     Psychosocial Re-Evaluation - 12/24/16 1046      Psychosocial Re-Evaluation   Comments Krisna called and said she is sorry that she is missing Pulmonary Rehab but that she still is sick with a cough. Dayona said she will see the doctor tomorrow.    Interventions Encouraged to attend Pulmonary Rehabilitation for the exercise      Education: Education Goals: Education classes will be provided on a weekly basis, covering required topics. Participant will state understanding/return demonstration of topics presented.  Learning Barriers/Preferences:     Learning Barriers/Preferences - 11/18/16 1528      Learning Barriers/Preferences   Learning Barriers None   Learning Preferences None      Education Topics: Initial Evaluation Education: - Verbal, written and demonstration of respiratory meds, RPE/PD scales, oximetry and breathing techniques. Instruction on use of  nebulizers and MDIs: cleaning and proper use, rinsing mouth with steroid doses and importance of monitoring MDI  activations.   Pulmonary Rehab from 12/10/2016 in [facility] Cardiac and Pulmonary Rehab  Date  11/18/16  Educator  LB  Instruction Review Code  2- meets goals/outcomes      General Nutrition Guidelines/Fats and Fiber: -Group instruction provided by verbal, written material, models and posters to present the general guidelines for heart healthy nutrition. Gives an explanation and review of dietary fats and fiber.   Controlling Sodium/Reading Food Labels: -Group verbal and written material supporting the discussion of sodium use in heart healthy nutrition. Review and explanation with models, verbal and written materials for utilization of the food label.   Exercise Physiology & Risk Factors: - Group verbal and written instruction with models to review the exercise physiology of the cardiovascular system and associated critical values. Details cardiovascular disease risk factors and the goals associated with each risk factor.   Aerobic Exercise & Resistance Training: - Gives group verbal and written discussion on the health impact of inactivity. On the components of aerobic and resistive training programs and the benefits of this training and how to safely progress through these programs.   Pulmonary Rehab from 12/10/2016 in [facility] Cardiac and Pulmonary Rehab  Date  12/03/16  Educator  [facility]  Instruction Review Code  2- meets goals/outcomes      Flexibility, Balance, General Exercise Guidelines: - Provides group verbal and written instruction on the benefits of flexibility and balance training programs. Provides general exercise guidelines with specific guidelines to those with heart or lung disease. Demonstration and skill practice provided.   Stress Management: - Provides group verbal and written instruction about the health risks of elevated stress, cause of high stress, and healthy ways to reduce stress.   Depression: - Provides group verbal and written instruction on the correlation between  heart/lung disease and depressed mood, treatment options, and the stigmas associated with seeking treatment.   Exercise & Equipment Safety: - Individual verbal instruction and demonstration of equipment use and safety with use of the equipment.   Infection Prevention: - Provides verbal and written material to individual with discussion of infection control including proper hand washing and proper equipment cleaning during exercise session.   Pulmonary Rehab from 12/10/2016 in [facility] Cardiac and Pulmonary Rehab  Date  11/18/16  Educator  LB  Instruction Review Code  2- meets goals/outcomes      Falls Prevention: - Provides verbal and written material to individual with discussion of falls prevention and safety.   Pulmonary Rehab from 12/10/2016 in [facility] Cardiac and Pulmonary Rehab  Date  11/18/16  Educator  LB  Instruction Review Code  2- meets goals/outcomes      Diabetes: - Individual verbal and written instruction to review signs/symptoms of diabetes, desired ranges of glucose level fasting, after meals and with exercise. Advice that pre and post exercise glucose checks will be done for 3 sessions at entry of program.   Pulmonary Rehab from 12/10/2016 in [facility] Cardiac and Pulmonary Rehab  Date  11/26/16  Educator  TS  Instruction Review Code  2- meets goals/outcomes      Chronic Lung Diseases: - Group verbal and written instruction to review new updates, new respiratory medications, new advancements in procedures and treatments. Provide informative websites and "800" numbers of self-education.   Pulmonary Rehab from 12/10/2016 in [facility] Cardiac and Pulmonary Rehab  Date  11/26/16  Educator  LB  Instruction Review Code  2- meets  goals/outcomes      Lung Procedures: - Group verbal and written instruction to describe testing methods done to diagnose lung disease. Review the outcome of test results. Describe the treatment choices: Pulmonary Function Tests, ABGs and oximetry.   Energy  Conservation: - Provide group verbal and written instruction for methods to conserve energy, plan and organize activities. Instruct on pacing techniques, use of adaptive equipment and posture/positioning to relieve shortness of breath.   Triggers: - Group verbal and written instruction to review types of environmental controls: home humidity, furnaces, filters, dust mite/pet prevention, HEPA vacuums. To discuss weather changes, air quality and the benefits of nasal washing.   Exacerbations: - Group verbal and written instruction to provide: warning signs, infection symptoms, calling MD promptly, preventive modes, and value of vaccinations. Review: effective airway clearance, coughing and/or vibration techniques. Create an Sports administrator.   Oxygen: - Individual and group verbal and written instruction on oxygen therapy. Includes supplement oxygen, available portable oxygen systems, continuous and intermittent flow rates, oxygen safety, concentrators, and Medicare reimbursement for oxygen.   Pulmonary Rehab from 12/10/2016 in [facility] Cardiac and Pulmonary Rehab  Date  11/18/16  Educator  LB  Instruction Review Code  2- meets goals/outcomes      Respiratory Medications: - Group verbal and written instruction to review medications for lung disease. Drug class, frequency, complications, importance of spacers, rinsing mouth after steroid MDI's, and proper cleaning methods for nebulizers.   Pulmonary Rehab from 12/10/2016 in [facility] Cardiac and Pulmonary Rehab  Date  11/18/16  Educator  LB  Instruction Review Code  2- meets goals/outcomes      AED/CPR: - Group verbal and written instruction with the use of models to demonstrate the basic use of the AED with the basic ABC's of resuscitation.   Breathing Retraining: - Provides individuals verbal and written instruction on purpose, frequency, and proper technique of diaphragmatic breathing and pursed-lipped breathing. Applies individual practice  skills.   Pulmonary Rehab from 12/10/2016 in [facility] Cardiac and Pulmonary Rehab  Date  11/18/16  Educator  LB  Instruction Review Code  2- meets goals/outcomes      Anatomy and Physiology of the Lungs: - Group verbal and written instruction with the use of models to provide basic lung anatomy and physiology related to function, structure and complications of lung disease.   Heart Failure: - Group verbal and written instruction on the basics of heart failure: signs/symptoms, treatments, explanation of ejection fraction, enlarged heart and cardiomyopathy.   Sleep Apnea: - Individual verbal and written instruction to review Obstructive Sleep Apnea. Review of risk factors, methods for diagnosing and types of masks and machines for OSA.   Pulmonary Rehab from 12/10/2016 in [facility] Cardiac and Pulmonary Rehab  Date  11/18/16  Educator  LB  Instruction Review Code  2- meets goals/outcomes      Anxiety: - Provides group, verbal and written instruction on the correlation between heart/lung disease and anxiety, treatment options, and management of anxiety.   Relaxation: - Provides group, verbal and written instruction about the benefits of relaxation for patients with heart/lung disease. Also provides patients with examples of relaxation techniques.   Pulmonary Rehab from 12/10/2016 in [facility] Cardiac and Pulmonary Rehab  Date  12/10/16  Educator  [facility]  Instruction Review Code  2- Meets goals/outcomes      Knowledge Questionnaire Score:     Knowledge Questionnaire Score - 11/18/16 1528      Knowledge Questionnaire Score   Pre Score 8/10  Core Components/Risk Factors/Patient Goals at Admission:     Personal Goals and Risk Factors at Admission - 11/18/16 1538      Core Components/Risk Factors/Patient Goals on Admission   Sedentary Yes   Intervention Provide advice, education, support and counseling about physical activity/exercise needs.;Develop an individualized exercise prescription  for aerobic and resistive training based on initial evaluation findings, risk stratification, comorbidities and participant's personal goals.   Expected Outcomes Achievement of increased cardiorespiratory fitness and enhanced flexibility, muscular endurance and strength shown through measurements of functional capacity and personal statement of participant.   Increase Strength and Stamina Yes   Intervention Provide advice, education, support and counseling about physical activity/exercise needs.;Develop an individualized exercise prescription for aerobic and resistive training based on initial evaluation findings, risk stratification, comorbidities and participant's personal goals.   Expected Outcomes Achievement of increased cardiorespiratory fitness and enhanced flexibility, muscular endurance and strength shown through measurements of functional capacity and personal statement of participant.   Improve shortness of breath with ADL's Yes   Intervention Provide education, individualized exercise plan and daily activity instruction to help decrease symptoms of SOB with activities of daily living.   Expected Outcomes Short Term: Achieves a reduction of symptoms when performing activities of daily living.   Develop more efficient breathing techniques such as purse lipped breathing and diaphragmatic breathing; and practicing self-pacing with activity Yes   Intervention Provide education, demonstration and support about specific breathing techniuqes utilized for more efficient breathing. Include techniques such as pursed lipped breathing, diaphragmatic breathing and self-pacing activity.   Expected Outcomes Short Term: Participant will be able to demonstrate and use breathing techniques as needed throughout daily activities.   Increase knowledge of respiratory medications and ability to use respiratory devices properly  Yes  Spiriva, Advair, Albuterol, Spacer given; SVN Albuterol   Intervention Provide  education and demonstration as needed of appropriate use of medications, inhalers, and oxygen therapy.   Expected Outcomes Short Term: Achieves understanding of medications use. Understands that oxygen is a medication prescribed by physician. Demonstrates appropriate use of inhaler and oxygen therapy.   Diabetes Yes   Intervention Provide education about signs/symptoms and action to take for hypo/hyperglycemia.;Provide education about proper nutrition, including hydration, and aerobic/resistive exercise prescription along with prescribed medications to achieve blood glucose in normal ranges: Fasting glucose 65-99 mg/dL   Expected Outcomes Short Term: Participant verbalizes understanding of the signs/symptoms and immediate care of hyper/hypoglycemia, proper foot care and importance of medication, aerobic/resistive exercise and nutrition plan for blood glucose control.;Long Term: Attainment of HbA1C < 7%.   Heart Failure Yes   Intervention Provide a combined exercise and nutrition program that is supplemented with education, support and counseling about heart failure. Directed toward relieving symptoms such as shortness of breath, decreased exercise tolerance, and extremity edema.   Expected Outcomes Improve functional capacity of life   Hypertension Yes   Intervention Provide education on lifestyle modifcations including regular physical activity/exercise, weight management, moderate sodium restriction and increased consumption of fresh fruit, vegetables, and low fat dairy, alcohol moderation, and smoking cessation.;Monitor prescription use compliance.   Expected Outcomes Short Term: Continued assessment and intervention until BP is < 140/32m HG in hypertensive participants. < 130/877mHG in hypertensive participants with diabetes, heart failure or chronic kidney disease.;Long Term: Maintenance of blood pressure at goal levels.   Lipids Yes   Intervention Provide education and support for participant on  nutrition & aerobic/resistive exercise along with prescribed medications to achieve LDL <7048mHDL >37m43m Expected  Outcomes Short Term: Participant states understanding of desired cholesterol values and is compliant with medications prescribed. Participant is following exercise prescription and nutrition guidelines.;Long Term: Cholesterol controlled with medications as prescribed, with individualized exercise RX and with personalized nutrition plan. Value goals: LDL < 59m, HDL > 40 mg.      Core Components/Risk Factors/Patient Goals Review:      Goals and Risk Factor Review    Row Name 11/26/16 1442             Core Components/Risk Factors/Patient Goals Review   Personal Goals Review Develop more efficient breathing techniques such as purse lipped breathing and diaphragmatic breathing and practicing self-pacing with activity.       Review Reviewed pursed lip breathing technique with MStanton Kidneytoday.       Expected Outcomes Short: MDavannawill become more proficient at using PLB.  Long: MWalterinewill become independent in using PLB.          Core Components/Risk Factors/Patient Goals at Discharge (Final Review):      Goals and Risk Factor Review - 11/26/16 1442      Core Components/Risk Factors/Patient Goals Review   Personal Goals Review Develop more efficient breathing techniques such as purse lipped breathing and diaphragmatic breathing and practicing self-pacing with activity.   Review Reviewed pursed lip breathing technique with MStanton Kidneytoday.   Expected Outcomes Short: MCharellwill become more proficient at using PLB.  Long: MParileewill become independent in using PLB.      ITP Comments:     ITP Comments    Row Name 12/24/16 1046 12/30/16 1550 01/07/17 1445 01/27/17 1050 02/04/17 0703   ITP Comments Amoreena called and said she is sorry that she is missing Pulmonary Rehab but that she still is sick with a cough. MEmilygracesaid she will see the doctor tomorrow.  Called Ms WMonfortetoday and left a  message; last attended 12/10/16. Ms WLienemanncalled 01/06/17 and stated she had been sick. [place]16. Called Ms WNightengale- last attended 12/10/16. Left message in regard to her glucose charges, and [place] Ms WStringfellowcalled and requested dischage from the program. She had a lot going on and has been dealing with bronchitis.      Comments: Ms WDexterattended 5 sessions of [facility] - last attended 12/10/16. Early Exit:  Ms WOlivarcalled and requested dischage from the program. She had a lot going on and has been dealing with bronchitis.

## 2017-02-04 NOTE — Progress Notes (Signed)
Discharge Summary  Patient Details  Name: Tami Skinner MRN: [id number] Date of Birth: [birth year] Referring Provider:     Pulmonary Rehab from 11/18/2016 in [facility] Cardiac and Pulmonary Rehab  Referring Provider  Simonds       Number of Visits: 5  Reason for Discharge:  Early Exit:  Tami Skinner called and requested dischage from the program. She had a lot going on and has been dealing with bronchitis. Last attended 12/10/16.  Smoking History:  History  Smoking Status   Former Smoker   Packs/day: 0.25   Years: 40.00   Types: Cigarettes   Quit date: 09/01/2016  Smokeless Tobacco   Never Used    Comment: Uses nicotrol inhaler on occasion    Diagnosis:  Chronic obstructive pulmonary disease, unspecified COPD type (HCC)  ADL UCSD:     Pulmonary Assessment Scores    Row Name 11/18/16 1531         ADL UCSD   ADL Phase Entry     SOB Score total 76     Rest 1     Walk 3     Stairs 5     Bath 3     Dress 2     Shop 5        Initial Exercise Prescription:     Initial Exercise Prescription - 11/18/16 1500      Date of Initial Exercise RX and Referring Provider   Date 11/18/16   Referring Provider Simonds     Oxygen   Oxygen Continuous   Liters 2     Treadmill   MPH 1.4   Grade 0.5   Minutes 15   METs 2.07     NuStep   Level 2   Minutes 15   METs 2     Biostep-RELP   Level 2   Minutes 15   METs 2     Prescription Details   Frequency (times per week) 3   Duration Progress to 45 minutes of aerobic exercise without signs/symptoms of physical distress     Intensity   THRR 40-80% of Max Heartrate 99-132   Ratings of Perceived Exertion 11-13   Perceived Dyspnea 0-4     Progression   Progression Continue to progress workloads to maintain intensity without signs/symptoms of physical distress.     Resistance Training   Training Prescription Yes   Weight 2   Reps 10-15      Discharge Exercise Prescription (Final Exercise Prescription  Changes):     Exercise Prescription Changes - 12/17/16 1600      Response to Exercise   Blood Pressure (Admit) 134/80   Blood Pressure (Exercise) 126/64   Blood Pressure (Exit) 124/60   Heart Rate (Exercise) 84 bpm   Heart Rate (Exit) 90 bpm   Oxygen Saturation (Exit) 92 %   Rating of Perceived Exertion (Exercise) 13   Perceived Dyspnea (Exercise) 3   Symptoms none   Duration Progress to 45 minutes of aerobic exercise without signs/symptoms of physical distress   Intensity THRR unchanged     Progression   Progression Continue to progress workloads to maintain intensity without signs/symptoms of physical distress.   Average METs 2     Resistance Training   Training Prescription Yes   Weight Green Band   Reps 10-15     Interval Training   Interval Training No     Oxygen   Oxygen Continuous   Liters 2     NuStep   Level  2   Minutes 15   METs 2     Biostep-RELP   Level 3   Minutes 15   METs 2     Exercise Review   Progression Yes      Functional Capacity:     6 Minute Walk    Row Name 11/18/16 1552         6 Minute Walk   Phase Initial     Distance 840 feet     Walk Time 6 minutes     # of Rest Breaks 0     MPH 1.6     METS 2.23     RPE 13     Perceived Dyspnea  3     VO2 Peak 7.27     Symptoms No     Resting HR 66 bpm     Resting BP 126/66     Max Ex. HR 90 bpm     Max Ex. BP 122/68       Interval HR   Baseline HR 66     1 Minute HR 78     2 Minute HR 82     3 Minute HR 86     4 Minute HR 90     5 Minute HR 82     2 Minute Post HR 65     Interval Heart Rate? Yes       Interval Oxygen   Interval Oxygen? Yes     Baseline Oxygen Saturation % 96 %     Baseline Liters of Oxygen 2 L     1 Minute Oxygen Saturation % 92 %     1 Minute Liters of Oxygen 2 L     2 Minute Oxygen Saturation % 93 %     2 Minute Liters of Oxygen 2 L     3 Minute Oxygen Saturation % 94 %     3 Minute Liters of Oxygen 2 L     4 Minute Oxygen Saturation % 92 %      4 Minute Liters of Oxygen 2 L     5 Minute Oxygen Saturation % 93 %     5 Minute Liters of Oxygen 2 L     6 Minute Oxygen Saturation % 93 %     6 Minute Liters of Oxygen 2 L     2 Minute Post Oxygen Saturation % 96 %     2 Minute Post Liters of Oxygen 2 L        Psychological, QOL, Others - Outcomes: PHQ 2/9: Depression screen [facility] 2/9 01/20/2017 11/18/2016 08/05/2016 05/20/2016 05/20/2016  Decreased Interest 2 2 0 0 0  Down, Depressed, Hopeless 0 0  PHQ - 2 Score 0 0  Altered sleeping 2 2 - - -  Tired, decreased energy 2 3 - - -  Change in appetite 1 0 - - -  Feeling bad or failure about yourself  - 0 - - -  Trouble concentrating 0 1 - - -  Moving slowly or fidgety/restless 0 0 - - -  Suicidal thoughts 0 0 - - -  PHQ-9 Score 9 10 - - -  Difficult doing work/chores - Somewhat difficult - - -  Some recent data might be hidden    Quality of Life:     Quality of Life - 11/18/16 1548      Quality of Life Scores   Health/Function Pre 20.5 %  Socioeconomic Pre 20.43 %   Psych/Spiritual Pre 20.86 %   Family Pre 20.6 %   GLOBAL Pre 20.57 %      Personal Goals: Goals established at orientation with interventions provided to work toward goal.     Personal Goals and Risk Factors at Admission - 11/18/16 1538      Core Components/Risk Factors/Patient Goals on Admission   Sedentary Yes   Intervention Provide advice, education, support and counseling about physical activity/exercise needs.;Develop an individualized exercise prescription for aerobic and resistive training based on initial evaluation findings, risk stratification, comorbidities and participant's personal goals.   Expected Outcomes Achievement of increased cardiorespiratory fitness and enhanced flexibility, muscular endurance and strength shown through measurements of functional capacity and personal statement of participant.   Increase Strength and Stamina Yes   Intervention Provide advice, education,  support and counseling about physical activity/exercise needs.;Develop an individualized exercise prescription for aerobic and resistive training based on initial evaluation findings, risk stratification, comorbidities and participant's personal goals.   Expected Outcomes Achievement of increased cardiorespiratory fitness and enhanced flexibility, muscular endurance and strength shown through measurements of functional capacity and personal statement of participant.   Improve shortness of breath with ADL's Yes   Intervention Provide education, individualized exercise plan and daily activity instruction to help decrease symptoms of SOB with activities of daily living.   Expected Outcomes Short Term: Achieves a reduction of symptoms when performing activities of daily living.   Develop more efficient breathing techniques such as purse lipped breathing and diaphragmatic breathing; and practicing self-pacing with activity Yes   Intervention Provide education, demonstration and support about specific breathing techniuqes utilized for more efficient breathing. Include techniques such as pursed lipped breathing, diaphragmatic breathing and self-pacing activity.   Expected Outcomes Short Term: Participant will be able to demonstrate and use breathing techniques as needed throughout daily activities.   Increase knowledge of respiratory medications and ability to use respiratory devices properly  Yes  Spiriva, Advair, Albuterol, Spacer given; SVN Albuterol   Intervention Provide education and demonstration as needed of appropriate use of medications, inhalers, and oxygen therapy.   Expected Outcomes Short Term: Achieves understanding of medications use. Understands that oxygen is a medication prescribed by physician. Demonstrates appropriate use of inhaler and oxygen therapy.   Diabetes Yes   Intervention Provide education about signs/symptoms and action to take for hypo/hyperglycemia.;Provide education about  proper nutrition, including hydration, and aerobic/resistive exercise prescription along with prescribed medications to achieve blood glucose in normal ranges: Fasting glucose 65-99 mg/dL   Expected Outcomes Short Term: Participant verbalizes understanding of the signs/symptoms and immediate care of hyper/hypoglycemia, proper foot care and importance of medication, aerobic/resistive exercise and nutrition plan for blood glucose control.;Long Term: Attainment of HbA1C < 7%.   Heart Failure Yes   Intervention Provide a combined exercise and nutrition program that is supplemented with education, support and counseling about heart failure. Directed toward relieving symptoms such as shortness of breath, decreased exercise tolerance, and extremity edema.   Expected Outcomes Improve functional capacity of life   Hypertension Yes   Intervention Provide education on lifestyle modifcations including regular physical activity/exercise, weight management, moderate sodium restriction and increased consumption of fresh fruit, vegetables, and low fat dairy, alcohol moderation, and smoking cessation.;Monitor prescription use compliance.   Expected Outcomes Short Term: Continued assessment and intervention until BP is < 140/68mm HG in hypertensive participants. < 130/16mm HG in hypertensive participants with diabetes, heart failure or chronic kidney disease.;Long Term: Maintenance of blood pressure at  goal levels.   Lipids Yes   Intervention Provide education and support for participant on nutrition & aerobic/resistive exercise along with prescribed medications to achieve LDL 70mg , HDL >40mg .   Expected Outcomes Short Term: Participant states understanding of desired cholesterol values and is compliant with medications prescribed. Participant is following exercise prescription and nutrition guidelines.;Long Term: Cholesterol controlled with medications as prescribed, with individualized exercise RX and with personalized  nutrition plan. Value goals: LDL < , HDL > 40 mg.       Personal Goals Discharge:     Goals and Risk Factor Review    Row Name 11/26/16 1442             Core Components/Risk Factors/Patient Goals Review   Personal Goals Review Develop more efficient breathing techniques such as purse lipped breathing and diaphragmatic breathing and practicing self-pacing with activity.       Review Reviewed pursed lip breathing technique with Corrie Dandy today.       Expected Outcomes Short: Jonessa will become more proficient at using PLB.  Long: Layali will become independent in using PLB.          Nutrition & Weight - Outcomes:     Pre Biometrics - 11/18/16 1552      Pre Biometrics   Height  (1.651 m)   Weight 142 lb 11.2 oz (64.7 kg)   Waist Circumference 34 inches   Hip Circumference 40.5 inches   Waist to Hip Ratio 0.84 %   BMI (Calculated) 23.8       Nutrition:   Nutrition Discharge:   Education Questionnaire Score:     Knowledge Questionnaire Score - 11/18/16 1528      Knowledge Questionnaire Score   Pre Score 8/10      Goals reviewed with patient; copy given to patient.

## 2017-02-05 ENCOUNTER — Telehealth: Payer: Self-pay

## 2017-02-05 MED ORDER — PREGABALIN 50 MG PO CAPS: 50.0000 mg | ORAL_CAPSULE | Freq: Every day | ORAL | 1 refills | Status: DC

## 2017-02-05 NOTE — Telephone Encounter (Signed)
Pt's ins will not pay for generic Lyrica but will pay for namebrand Lyrica and it will be less expensive if pt gets 90 day rx instead of 30 day rx.Please advise.

## 2017-02-05 NOTE — Telephone Encounter (Signed)
Name brand Lyrica sent to pharmacy, 90 day supply

## 2017-02-05 NOTE — Addendum Note (Signed)
Addended by: Lorre Munroe on: 02/05/2017 04:27 PM   Modules accepted: Orders

## 2017-02-16 ENCOUNTER — Other Ambulatory Visit: Payer: Self-pay | Admitting: Internal Medicine

## 2017-02-16 NOTE — Telephone Encounter (Signed)
Rx called in to pharmacy. 

## 2017-02-16 NOTE — Telephone Encounter (Signed)
Ok to phone in Xanax 

## 2017-02-16 NOTE — Telephone Encounter (Signed)
Last filled 12/17/16--please advise 

## 2017-02-18 ENCOUNTER — Other Ambulatory Visit: Payer: Self-pay | Admitting: Internal Medicine

## 2017-02-26 ENCOUNTER — Other Ambulatory Visit: Payer: Self-pay | Admitting: Internal Medicine

## 2017-02-27 ENCOUNTER — Other Ambulatory Visit: Payer: Self-pay | Admitting: Internal Medicine

## 2017-03-03 ENCOUNTER — Ambulatory Visit (INDEPENDENT_AMBULATORY_CARE_PROVIDER_SITE_OTHER): Payer: Medicare Other | Admitting: Internal Medicine

## 2017-03-03 ENCOUNTER — Encounter: Payer: Self-pay | Admitting: Internal Medicine

## 2017-03-03 VITALS — BP 120/66 | HR 83 | Temp 98.0°F | Wt 148.0 lb

## 2017-03-03 DIAGNOSIS — M792 Neuralgia and neuritis, unspecified: Secondary | ICD-10-CM | POA: Diagnosis not present

## 2017-03-03 DIAGNOSIS — R682 Dry mouth, unspecified: Secondary | ICD-10-CM | POA: Diagnosis not present

## 2017-03-03 DIAGNOSIS — B372 Candidiasis of skin and nail: Secondary | ICD-10-CM | POA: Diagnosis not present

## 2017-03-03 MED ORDER — PREGABALIN 50 MG PO CAPS: 100.0000 mg | ORAL_CAPSULE | Freq: Every day | ORAL | 5 refills | Status: DC

## 2017-03-03 MED ORDER — CLOTRIMAZOLE-BETAMETHASONE 1-0.05 % EX CREA: 1.0000 "application " | TOPICAL_CREAM | Freq: Two times a day (BID) | CUTANEOUS | 0 refills | Status: DC

## 2017-03-03 NOTE — Progress Notes (Signed)
Subjective:    Patient ID: Tami Skinner, female    DOB: 1945/12/09, 71 y.o.   MRN: [id number]  HPI  Pt presents to the clinic today with c/o a rash under her breasts. She noticed this 2 weeks ago. The rash itches and burns. It has not spread to other parts of her body. She reports she has had a rash like this in the past and was treated with Nystatin Powder, but reports it did not help. She has tried Clobetasol cream and Vaseline without any relief.   She also c/o dry mouth. This has been a persistent issue for her. She thinks it is a side of her Xanax. She reports she is unable to stop the Xanax at this time. She has tried Biotin mouthwash with minimal relief. She was wanting to know if there were any other available treatments for her dry mouth.   She also wants to discuss her Lyrica. She feels like the Lyrica is ineffective and all it does is make her want to eat. She has not noticed a significant weight gain. She wants to know if it is possible to Increase the Lyrica dose in hopes it will give her some relief.   Review of Systems  Past Medical History:  Diagnosis Date  . Anxiety   . Asthmatic bronchitis   . COPD (chronic obstructive pulmonary disease) (HCC)   . Depression   . Diabetes mellitus   . Diverticulitis   . Diverticulosis   . Hyperlipidemia   . Hypertension   . Osteopenia   . Sleep apnea   . Stress-induced cardiomyopathy    a. echo 09/28/2014: EF 45-50%, severe HK of mid-distal anterior, apical and mid-distal inferior walls suggestive of stress induced CM, mildly dilated LA, mildly dilated PASP, mild TR, trivial pericardial effusion    Current Outpatient Prescriptions  Medication Sig Dispense Refill  . albuterol (PROVENTIL) (2.5 MG/3ML) 0.083% nebulizer solution Take 3 mLs (2.5 mg total) by nebulization every 6 (six) hours as needed for wheezing or shortness of breath. 75 mL 12  . ALPRAZolam (XANAX) 0.5 MG tablet TAKE 1 TABLET BY MOUTH EVERY 8 HOURS AS NEEDED 90  tablet 0  . Cholecalciferol (VITAMIN D-3) 1000 UNITS CAPS Take 2 capsules by mouth.     . Fluticasone-Salmeterol (ADVAIR DISKUS) 250-50 MCG/DOSE AEPB Inhale 1 puff into the lungs 2 (two) times daily. 60 each 10  . metFORMIN (GLUCOPHAGE) 500 MG tablet TAKE 1 TABLET BY MOUTH TWICE A DAY WITH A MEAL 180 tablet 3  . metoprolol succinate (TOPROL-XL) 25 MG 24 hr tablet Take 1 tablet (25 mg total) by mouth daily. 90 tablet 3  . ondansetron (ZOFRAN) 4 MG tablet Take 1 tablet (4 mg total) by mouth every 8 (eight) hours as needed. 20 tablet 0  . ONE TOUCH ULTRA TEST test strip TEST BLOOD SUGAR AT HOME TWICE A DAY 100 each 5  . OXYGEN-HELIUM IN Inhale 2 % into the lungs at bedtime.      . pregabalin (LYRICA) 50 MG capsule Take 1 capsule (50 mg total) by mouth daily. 90 capsule 1  . rosuvastatin (CRESTOR) 5 MG tablet TAKE 1 TABLET (5 MG TOTAL) BY MOUTH DAILY. 90 tablet 1  . sertraline (ZOLOFT) 100 MG tablet TAKE 1 AND 1/2 TABLET BY MOUTH TWICE A DAY 270 tablet 1  . SPIRIVA HANDIHALER 18 MCG inhalation capsule INHALE 1 CAPSULE VIA HANDIHALER DAILY 90 capsule 1  . valsartan (DIOVAN) 160 MG tablet Take 1 tablet (160 mg  total) by mouth daily. 90 tablet 3  . VENTOLIN HFA 108 (90 Base) MCG/ACT inhaler INHALE TWO PUFFS INTO THE LUNGS EVERY FOUR HOURS AS NEEDED FOR WHEEZING 18 Inhaler 4   No current facility-administered medications for this visit.     Allergies  Allergen Reactions  . Amoxicillin-Pot Clavulanate     REACTION: stomach sensitive.  Lenice Llamas [Roflumilast]     Severe nausea   . Erythromycin Base     REACTION: GI upset  . Neurontin [Gabapentin]     anxiety    Family History  Problem Relation Age of Onset  . Diabetes Daughter   . Kidney disease Daughter   . Lung cancer Mother     smoked  . COPD Mother     smoked  . COPD Father     smoked  . Prostate cancer Father   . Kidney failure Father   . Breast cancer Maternal Aunt   . Diabetes Maternal Grandmother   . Breast cancer Maternal  Grandmother   . Cancer Cousin     colon    Social History   Social History  . Marital status: Married    Spouse name: N/A  . Number of children: N/A  . Years of education: N/A   Occupational History  . Not on file.   Social History Main Topics  . Smoking status: Former Smoker    Packs/day: 0.25    Years: 40.00    Types: Cigarettes    Quit date: 09/01/2016  . Smokeless tobacco: Never Used     Comment: Uses nicotrol inhaler on occasion  . Alcohol use No  . Drug use: No  . Sexual activity: Not Currently   Other Topics Concern  . Not on file   Social History Narrative   Lives with husband in [place]. 2 dogs. Works at [employer].     Constitutional: Denies fever, malaise, fatigue, headache or abrupt weight changes.  HEENT: Pt reports dry mouth. Denies eye pain, eye redness, ear pain, ringing in the ears, wax buildup, runny nose, nasal congestion, bloody nose, or sore throat. Skin: Pt reports rash under her breasts. Denies redness, lesions or ulcercations.  Neurological: Pt reports neuropathic pain. Denies dizziness, difficulty with memory, difficulty with speech or problems with balance and coordination.  Psych: Pt reports anxiety. Denies anxiety, depression, SI/HI.  No other specific complaints in a complete review of systems (except as listed in HPI above).     Objective:   Physical Exam   BP 120/66   Pulse 83   Temp 98 F (36.7 C) (Oral)   Wt 148 lb (67.1 kg)   SpO2 93%   BMI 25.01 kg/m  Wt Readings from Last 3 Encounters:  03/03/17 148 lb (67.1 kg)  01/20/17 145 lb 12 oz (66.1 kg)  12/12/16 147 lb (66.7 kg)    General: Appears her stated age,  in NAD. Skin: Erythematous rash with satellite lesions noted under bilateral breasts. HEENT:  Throat/Mouth: Teeth present, mucosa pink and dry, no lesions or ulcerations noted.  Neurological: Sensation intact to BLE. Psychiatric: Mood and affect normal. Behavior is normal. Judgment and thought content normal.     BMET    Component Value Date/Time   NA 138 10/31/2016 1622   NA 140 09/28/2014 0637   K 4.2 10/31/2016 1622   K 4.3 09/28/2014 0637   CL 99 10/31/2016 1622   CL 108 (H) 09/28/2014 0637   CO2 23 10/31/2016 1622   CO2 26 09/28/2014 9604  GLUCOSE 111 (H) 10/31/2016 1622   GLUCOSE 117 (H) 09/28/2014 0637   BUN 14 10/31/2016 1622   BUN 13 09/28/2014 0637   CREATININE 0.82 10/31/2016 1622   CALCIUM 10.2 10/31/2016 1622   CALCIUM 7.7 (L) 09/28/2014 0637   GFRNONAA >60 09/28/2014 0637   GFRAA >60 09/28/2014 0637    Lipid Panel     Component Value Date/Time   CHOL 156 01/20/2017 1407   CHOL 110 09/28/2014 0637   TRIG 59.0 01/20/2017 1407   TRIG 102 09/28/2014 0637   HDL 82.90 01/20/2017 1407   HDL 54 09/28/2014 0637   CHOLHDL 2 01/20/2017 1407   VLDL 11.8 01/20/2017 1407   VLDL 20 09/28/2014 0637   LDLCALC 61 01/20/2017 1407   LDLCALC 36 09/28/2014 0637    CBC    Component Value Date/Time   WBC 6.3 10/31/2016 1622   RBC 4.66 10/31/2016 1622   HGB 13.6 10/31/2016 1622   HGB 13.0 09/30/2014 0525   HCT 41.5 10/31/2016 1622   HCT 39.0 09/30/2014 0525   PLT 292 10/31/2016 1622   PLT 234 09/30/2014 0525   MCV 89.1 10/31/2016 1622   MCV 93 09/30/2014 0525   MCH 29.2 10/31/2016 1622   MCHC 32.8 10/31/2016 1622   RDW 14.1 10/31/2016 1622   RDW 14.1 09/30/2014 0525   LYMPHSABS 1,260 10/31/2016 1622   LYMPHSABS 1.3 09/30/2014 0525   MONOABS 693 10/31/2016 1622   MONOABS 0.5 09/30/2014 0525   EOSABS 189 10/31/2016 1622   EOSABS 0.2 09/30/2014 0525   BASOSABS 0 10/31/2016 1622   BASOSABS 0.0 09/30/2014 0525    Hgb A1C Lab Results  Component Value Date   HGBA1C 6.4 11/04/2016           Assessment & Plan:   Candida Intertrigo:  eRx for Lotrisone to affected area BID until resolved Try to keep area warm and dry Avoid rubbing the area after you get out of the bath, try to pat dry or dry with a hair dryer  Neuropathic Pain:  Increase Lyrica to 100 mg  daily RX provided today  Dry Mouth:  Advised her to suck on hard candy Keep water with you and sip it throughout the day Continue Biotin  RTC as needed or if symptoms persist or worsen BAITY, REGINA, NP

## 2017-03-03 NOTE — Patient Instructions (Signed)
Intertrigo Intertrigo is skin irritation (inflammation) that happens in warm, moist areas of the body. The irritation can cause a rash and make skin raw and itchy. The rash is usually pink or red. It happens mostly between folds of skin or where skin rubs together, such as:  Toes.  Armpits.  Groin.  Belly.  Breasts.  Buttocks. This condition is not passed from person to person (is not contagious). Follow these instructions at home:  Keep the affected area clean and dry.  Do not scratch your skin.  Stay cool as much as possible. Use an air conditioner or fan, if you can.  Apply over-the-counter and prescription medicines only as told by your doctor.  If you were prescribed an antibiotic medicine, use it as told by your doctor. Do not stop using the antibiotic even if your condition starts to get better.  Keep all follow-up visits as told by your doctor. This is important. How is this prevented?  Stay at a healthy weight.  Keep your feet dry. This is very important if you have diabetes. Wear cotton or wool socks.  Take care of and protect the skin in your groin and butt area as told by your doctor.  Do not wear tight clothes. Wear clothes that:  Are loose.  Take away moisture from your body.  Are made of cotton.  Wear a bra that gives good support, if needed.  Shower and dry yourself fully after being active.  Keep your blood sugar under control if you have diabetes. Contact a doctor if:  Your symptoms do not get better with treatment.  Your symptoms get worse or they spread.  You notice more redness and warmth.  You have a fever. This information is not intended to replace advice given to you by your health care provider. Make sure you discuss any questions you have with your health care provider. Document Released: 11/22/2010 Document Revised: 03/27/2016 Document Reviewed: 04/23/2015 Elsevier Interactive Patient Education  2017 Elsevier Inc.  

## 2017-04-07 ENCOUNTER — Encounter: Payer: Self-pay | Admitting: Internal Medicine

## 2017-04-07 MED ORDER — GLUCOSE BLOOD VI STRP: 1.0000 | ORAL_STRIP | Freq: Two times a day (BID) | 5 refills | Status: DC

## 2017-05-01 ENCOUNTER — Other Ambulatory Visit: Payer: Self-pay | Admitting: Internal Medicine

## 2017-05-02 NOTE — Telephone Encounter (Signed)
Last filled 02/16/17.. Please advise 

## 2017-05-04 NOTE — Telephone Encounter (Signed)
Rx called in to pharmacy. 

## 2017-05-04 NOTE — Telephone Encounter (Signed)
Ok to phone in Xanax 

## 2017-05-05 ENCOUNTER — Ambulatory Visit: Payer: Self-pay | Admitting: Cardiovascular Disease

## 2017-05-07 ENCOUNTER — Other Ambulatory Visit: Payer: Self-pay | Admitting: Internal Medicine

## 2017-05-07 DIAGNOSIS — B372 Candidiasis of skin and nail: Secondary | ICD-10-CM

## 2017-05-11 ENCOUNTER — Other Ambulatory Visit: Payer: Self-pay | Admitting: Internal Medicine

## 2017-05-11 DIAGNOSIS — B372 Candidiasis of skin and nail: Secondary | ICD-10-CM

## 2017-05-11 MED ORDER — CLOTRIMAZOLE-BETAMETHASONE 1-0.05 % EX CREA: 1.0000 "application " | TOPICAL_CREAM | Freq: Two times a day (BID) | CUTANEOUS | 0 refills | Status: DC

## 2017-05-12 ENCOUNTER — Ambulatory Visit: Payer: Self-pay | Admitting: Internal Medicine

## 2017-05-12 ENCOUNTER — Encounter: Payer: Self-pay | Admitting: Internal Medicine

## 2017-05-13 NOTE — Telephone Encounter (Signed)
Pt is requesting more cream... Next dose is 45g... Please advise if okay to change

## 2017-05-18 ENCOUNTER — Encounter: Payer: Self-pay | Admitting: Internal Medicine

## 2017-05-22 MED ORDER — LOSARTAN POTASSIUM 50 MG PO TABS: 75.0000 mg | ORAL_TABLET | Freq: Every day | ORAL | 0 refills | Status: DC

## 2017-06-09 ENCOUNTER — Other Ambulatory Visit: Payer: Self-pay | Admitting: Internal Medicine

## 2017-06-09 NOTE — Telephone Encounter (Signed)
Okay to phone in as prescribed in chart. #90, no refills.

## 2017-06-09 NOTE — Telephone Encounter (Signed)
Rx called in to pharmacy. 

## 2017-06-09 NOTE — Telephone Encounter (Signed)
Baity pt, out of the office... Last filled 05/04/17... Please advise

## 2017-06-15 ENCOUNTER — Encounter: Payer: Self-pay | Admitting: Internal Medicine

## 2017-06-15 ENCOUNTER — Telehealth: Payer: Self-pay | Admitting: *Deleted

## 2017-06-15 NOTE — Telephone Encounter (Signed)
PT called and asked for a cb. She said triage couldn't help her.

## 2017-06-15 NOTE — Telephone Encounter (Signed)
Pt returning our call °Please call back ° °

## 2017-06-15 NOTE — Telephone Encounter (Signed)
Informed pt of DK's response and mentioned to also calling her PCP to see if they can see her today or tomorrow. Pt stated Thanks and hung up. Nothing further needed

## 2017-06-15 NOTE — Telephone Encounter (Signed)
GO TO ER °

## 2017-06-15 NOTE — Telephone Encounter (Signed)
LMOVM for pt to return call 

## 2017-06-15 NOTE — Telephone Encounter (Signed)
Pt called and states that she is having more SOB and chest tightness and her heart feelings like it is racing at times. Pt is going out of town of Thursday but we have no one in office. Please advise. DS pt.

## 2017-06-16 ENCOUNTER — Encounter (INDEPENDENT_AMBULATORY_CARE_PROVIDER_SITE_OTHER): Payer: Self-pay

## 2017-06-16 ENCOUNTER — Ambulatory Visit (INDEPENDENT_AMBULATORY_CARE_PROVIDER_SITE_OTHER)
Admission: RE | Admit: 2017-06-16 | Discharge: 2017-06-16 | Disposition: A | Discharge status: Home / Self Care | Payer: Medicare Other | Source: Ambulatory Visit | Attending: Internal Medicine | Admitting: Internal Medicine

## 2017-06-16 ENCOUNTER — Encounter: Payer: Self-pay | Admitting: Internal Medicine

## 2017-06-16 ENCOUNTER — Ambulatory Visit (INDEPENDENT_AMBULATORY_CARE_PROVIDER_SITE_OTHER): Payer: Medicare Other | Admitting: Internal Medicine

## 2017-06-16 VITALS — BP 118/66 | HR 78 | Temp 98.3°F | Wt 152.0 lb

## 2017-06-16 DIAGNOSIS — J441 Chronic obstructive pulmonary disease with (acute) exacerbation: Secondary | ICD-10-CM

## 2017-06-16 MED ORDER — PREDNISONE 10 MG PO TABS: ORAL_TABLET | ORAL | 0 refills | Status: DC

## 2017-06-16 NOTE — Progress Notes (Signed)
Subjective:    Patient ID: Tami Skinner, female    DOB: 07/29/1946, 71 y.o.   MRN: [id number]  HPI  Pt presents to the clinic today with c/o shortness of breath and worsening cough. She has noticed this over the last few weeks. The cough is productive at time, with thick yellow mucous. She is more short of breath with exertion, like getting dressed, taking a shower, walking long distance. She denies chest pain. She does have COPD. She is taking Advair and Spriva as prescribed. She has an Albuterol inhaler and uses it every 4 hours but does not feel like it is effective. She has oxygen that she wears with exertion and at night through her CPAP. She tried to see her pulmonologist but she reports they did not have an appts available. PFT's and chest xray from 11/2016 reviewed.  Review of Systems      Past Medical History:  Diagnosis Date  . Anxiety   . Asthmatic bronchitis   . COPD (chronic obstructive pulmonary disease) (HCC)   . Depression   . Diabetes mellitus   . Diverticulitis   . Diverticulosis   . Hyperlipidemia   . Hypertension   . Osteopenia   . Sleep apnea   . Stress-induced cardiomyopathy    a. echo 09/28/2014: EF 45-50%, severe HK of mid-distal anterior, apical and mid-distal inferior walls suggestive of stress induced CM, mildly dilated LA, mildly dilated PASP, mild TR, trivial pericardial effusion    Current Outpatient Prescriptions  Medication Sig Dispense Refill  . albuterol (PROVENTIL) (2.5 MG/3ML) 0.083% nebulizer solution Take 3 mLs (2.5 mg total) by nebulization every 6 (six) hours as needed for wheezing or shortness of breath. 75 mL 12  . ALPRAZolam (XANAX) 0.5 MG tablet TAKE 1 TABLET BY MOUTH EVERY 8 HOURS AS NEEDED 90 tablet 0  . Cholecalciferol (VITAMIN D-3) 1000 UNITS CAPS Take 2 capsules by mouth.     . clotrimazole-betamethasone (LOTRISONE) cream APPLY TO AFFECTED AREA TWICE A DAY 45 g 0  . Fluticasone-Salmeterol (ADVAIR DISKUS) 250-50 MCG/DOSE AEPB  Inhale 1 puff into the lungs 2 (two) times daily. 60 each 10  . glucose blood (ONE TOUCH ULTRA TEST) test strip 1 each by Other route 2 (two) times daily. Use as instructed 100 each 5  . losartan (COZAAR) 50 MG tablet Take 1.5 tablets (75 mg total) by mouth daily. 135 tablet 0  . metFORMIN (GLUCOPHAGE) 500 MG tablet TAKE 1 TABLET BY MOUTH TWICE A DAY WITH A MEAL 180 tablet 3  . metoprolol succinate (TOPROL-XL) 25 MG 24 hr tablet Take 1 tablet (25 mg total) by mouth daily. 90 tablet 3  . ondansetron (ZOFRAN) 4 MG tablet Take 1 tablet (4 mg total) by mouth every 8 (eight) hours as needed. 20 tablet 0  . OXYGEN-HELIUM IN Inhale 2 % into the lungs at bedtime.      . pregabalin (LYRICA) 50 MG capsule Take 2 capsules (100 mg total) by mouth daily. 60 capsule 5  . rosuvastatin (CRESTOR) 5 MG tablet TAKE 1 TABLET (5 MG TOTAL) BY MOUTH DAILY. 90 tablet 1  . sertraline (ZOLOFT) 100 MG tablet TAKE 1 AND 1/2 TABLET BY MOUTH TWICE A DAY 270 tablet 1  . SPIRIVA HANDIHALER 18 MCG inhalation capsule INHALE 1 CAPSULE VIA HANDIHALER DAILY 90 capsule 1  . VENTOLIN HFA 108 (90 Base) MCG/ACT inhaler INHALE TWO PUFFS INTO THE LUNGS EVERY FOUR HOURS AS NEEDED FOR WHEEZING 18 Inhaler 4   No current facility-administered  medications for this visit.     Allergies  Allergen Reactions  . Amoxicillin-Pot Clavulanate     REACTION: stomach sensitive.  Lenice Llamas. Daliresp [Roflumilast]     Severe nausea   . Erythromycin Base     REACTION: GI upset  . Neurontin [Gabapentin]     anxiety    Family History  Problem Relation Age of Onset  . Diabetes Daughter   . Kidney disease Daughter   . Lung cancer Mother        smoked  . COPD Mother        smoked  . COPD Father        smoked  . Prostate cancer Father   . Kidney failure Father   . Breast cancer Maternal Aunt   . Diabetes Maternal Grandmother   . Breast cancer Maternal Grandmother   . Cancer Cousin        colon    Social History   Social History  . Marital  status: Married    Spouse name: N/A  . Number of children: N/A  . Years of education: N/A   Occupational History  . Not on file.   Social History Main Topics  . Smoking status: Former Smoker    Packs/day: 0.25    Years: 40.00    Types: Cigarettes    Quit date: 09/01/2016  . Smokeless tobacco: Never Used     Comment: Uses nicotrol inhaler on occasion  . Alcohol use No  . Drug use: No  . Sexual activity: Not Currently   Other Topics Concern  . Not on file   Social History Narrative   [place] in [place]. 2 dogs. Works at [employer].     Constitutional: Denies fever, malaise, fatigue, headache or abrupt weight changes.  HEENT: Denies eye pain, eye redness, ear pain, ringing in the ears, wax buildup, runny nose, nasal congestion, bloody nose, or sore throat. Respiratory: Pt reports cough and shortness of breath. Denies difficulty breathing.   Cardiovascular: Denies chest pain, chest tightness, palpitations or swelling in the hands or feet.   No other specific complaints in a complete review of systems (except as listed in HPI above).  Objective:   Physical Exam  BP 118/66   Pulse 78   Temp 98.3 F (36.8 C) (Oral)   Wt 152 lb (68.9 kg)   SpO2 92%   BMI 25.69 kg/m  Wt Readings from Last 3 Encounters:  06/16/17 152 lb (68.9 kg)  03/03/17 148 lb (67.1 kg)  01/20/17 145 lb 12 oz (66.1 kg)    General: Appears her stated age, in NAD. HEENT:  Throat/Mouth: Teeth present, mucosa pink and moist, no exudate, lesions or ulcerations noted.  Neck: No adenopathy noted.  Cardiovascular: Normal rate and rhythm. Pulmonary/Chest: Increased effort and with diminished breath sounds with bilateral expiratory wheezing noted. No respiratory distress.    BMET    Component Value Date/Time   NA 138 10/31/2016 1622   NA 140 09/28/2014 0637   K 4.2 10/31/2016 1622   K 4.3 09/28/2014 0637   CL 99 10/31/2016 1622   CL 108 (H) 09/28/2014 0637   CO2 23 10/31/2016 1622   CO2 26  09/28/2014 0637   GLUCOSE 111 (H) 10/31/2016 1622   GLUCOSE 117 (H) 09/28/2014 0637   BUN 14 10/31/2016 1622   BUN 13 09/28/2014 0637   CREATININE 0.82 10/31/2016 1622   CALCIUM 10.2 10/31/2016 1622   CALCIUM 7.7 (L) 09/28/2014 0637   GFRNONAA >60 09/28/2014  4098   GFRAA >60 09/28/2014 0637    Lipid Panel     Component Value Date/Time   CHOL 156 01/20/2017 1407   CHOL 110 09/28/2014 0637   TRIG 59.0 01/20/2017 1407   TRIG 102 09/28/2014 0637   HDL 82.90 01/20/2017 1407   HDL 54 09/28/2014 0637   CHOLHDL 2 01/20/2017 1407   VLDL 11.8 01/20/2017 1407   VLDL 20 09/28/2014 0637   LDLCALC 61 01/20/2017 1407   LDLCALC 36 09/28/2014 0637    CBC    Component Value Date/Time   WBC 6.3 10/31/2016 1622   RBC 4.66 10/31/2016 1622   HGB 13.6 10/31/2016 1622   HGB 13.0 09/30/2014 0525   HCT 41.5 10/31/2016 1622   HCT 39.0 09/30/2014 0525   PLT 292 10/31/2016 1622   PLT 234 09/30/2014 0525   MCV 89.1 10/31/2016 1622   MCV 93 09/30/2014 0525   MCH 29.2 10/31/2016 1622   MCHC 32.8 10/31/2016 1622   RDW 14.1 10/31/2016 1622   RDW 14.1 09/30/2014 0525   LYMPHSABS 1,260 10/31/2016 1622   LYMPHSABS 1.3 09/30/2014 0525   MONOABS 693 10/31/2016 1622   MONOABS 0.5 09/30/2014 0525   EOSABS 189 10/31/2016 1622   EOSABS 0.2 09/30/2014 0525   BASOSABS 0 10/31/2016 1622   BASOSABS 0.0 09/30/2014 0525    Hgb A1C Lab Results  Component Value Date   HGBA1C 6.4 11/04/2016            Assessment & Plan:   COPD Exacerbation:  Albuterol neb in office Continue Spiriva, Advair and Albuterol eRx for Pred Taper x 6 days Chest xray today to r/o infection, if positive will treat with Levaquin  Return precautions discussed, to ER if worse [name], NP

## 2017-06-16 NOTE — Patient Instructions (Signed)
Chronic Obstructive Pulmonary Disease Exacerbation  Chronic obstructive pulmonary disease (COPD) is a common lung problem. In COPD, the flow of air from the lungs is limited. COPD exacerbations are times that breathing gets worse and you need extra treatment. Without treatment they can be life threatening. If they happen often, your lungs can become more damaged. If your COPD gets worse, your doctor may treat you with:  ? Medicines.  ? Oxygen.  ? Different ways to clear your airway, such as using a mask.    Follow these instructions at home:  ? Do not smoke.  ? Avoid tobacco smoke and other things that bother your lungs.  ? If given, take your antibiotic medicine as told. Finish the medicine even if you start to feel better.  ? Only take medicines as told by your doctor.  ? Drink enough fluids to keep your pee (urine) clear or pale yellow (unless your doctor has told you not to).  ? Use a cool mist machine (vaporizer).  ? If you use oxygen or a machine that turns liquid medicine into a mist (nebulizer), continue to use them as told.  ? Keep up with shots (vaccinations) as told by your doctor.  ? Exercise regularly.  ? Eat healthy foods.  ? Keep all doctor visits as told.  Get help right away if:  ? You are very short of breath and it gets worse.  ? You have trouble talking.  ? You have bad chest pain.  ? You have blood in your spit (sputum).  ? You have a fever.  ? You keep throwing up (vomiting).  ? You feel weak, or you pass out (faint).  ? You feel confused.  ? You keep getting worse.  This information is not intended to replace advice given to you by your health care provider. Make sure you discuss any questions you have with your health care provider.  Document Released: 10/09/2011 Document Revised: 03/27/2016 Document Reviewed: 06/24/2013  Elsevier Interactive Patient Education ? 2017 Elsevier Inc.

## 2017-06-19 MED ORDER — ALBUTEROL SULFATE (2.5 MG/3ML) 0.083% IN NEBU
2.5000 mg | INHALATION_SOLUTION | Freq: Once | RESPIRATORY_TRACT | Status: AC
  Administered 2017-06-16: 2.5 mg via RESPIRATORY_TRACT

## 2017-06-19 NOTE — Addendum Note (Signed)
Addended by: Roena Malady on: 06/19/2017 09:43 AM   Modules accepted: Orders

## 2017-06-29 ENCOUNTER — Other Ambulatory Visit: Payer: Self-pay | Admitting: Internal Medicine

## 2017-06-30 ENCOUNTER — Encounter: Payer: Self-pay | Admitting: Cardiovascular Disease

## 2017-06-30 ENCOUNTER — Encounter: Payer: Self-pay | Admitting: Pulmonary Disease

## 2017-06-30 ENCOUNTER — Ambulatory Visit (INDEPENDENT_AMBULATORY_CARE_PROVIDER_SITE_OTHER): Payer: Medicare Other | Admitting: Pulmonary Disease

## 2017-06-30 ENCOUNTER — Ambulatory Visit (INDEPENDENT_AMBULATORY_CARE_PROVIDER_SITE_OTHER): Payer: Medicare Other | Admitting: Cardiovascular Disease

## 2017-06-30 VITALS — BP 90/60 | HR 79 | Ht 65.0 in | Wt 157.0 lb

## 2017-06-30 VITALS — BP 108/70 | HR 72 | Resp 16 | Ht 64.5 in | Wt 157.0 lb

## 2017-06-30 DIAGNOSIS — R0602 Shortness of breath: Secondary | ICD-10-CM | POA: Diagnosis not present

## 2017-06-30 DIAGNOSIS — I493 Ventricular premature depolarization: Secondary | ICD-10-CM | POA: Diagnosis not present

## 2017-06-30 DIAGNOSIS — E7800 Pure hypercholesterolemia, unspecified: Secondary | ICD-10-CM

## 2017-06-30 DIAGNOSIS — R079 Chest pain, unspecified: Secondary | ICD-10-CM | POA: Diagnosis not present

## 2017-06-30 DIAGNOSIS — I5181 Takotsubo syndrome: Secondary | ICD-10-CM

## 2017-06-30 DIAGNOSIS — I10 Essential (primary) hypertension: Secondary | ICD-10-CM | POA: Diagnosis not present

## 2017-06-30 DIAGNOSIS — J449 Chronic obstructive pulmonary disease, unspecified: Secondary | ICD-10-CM | POA: Diagnosis not present

## 2017-06-30 MED ORDER — METOPROLOL SUCCINATE ER 50 MG PO TB24: 50.0000 mg | ORAL_TABLET | Freq: Every day | ORAL | 3 refills | Status: DC

## 2017-06-30 MED ORDER — LOSARTAN POTASSIUM 50 MG PO TABS: 50.0000 mg | ORAL_TABLET | Freq: Every day | ORAL | 3 refills | Status: DC

## 2017-06-30 NOTE — Patient Instructions (Addendum)
Medication Instructions:  Your physician has recommended you make the following change in your medication:  DECREASE losartan to 50mg  once daily INCREASE metoprolol to 50mg  once daily    Labwork: none  Testing/Procedures: Your physician has requested that you have an echocardiogram. Echocardiography is a painless test that uses sound waves to create images of your heart. It provides your doctor with information about the size and shape of your heart and how well your heart's chambers and valves are working. This procedure takes approximately one hour. There are no restrictions for this procedure.    Follow-Up: Your physician recommends that you schedule a follow-up appointment in: 1 month with Dr. Kirke Corin.    Any Other Special Instructions Will Be Listed Below (If Applicable).     If you need a refill on your cardiac medications before your next appointment, please call your pharmacy.  Echocardiogram An echocardiogram, or echocardiography, uses sound waves (ultrasound) to produce an image of your heart. The echocardiogram is simple, painless, obtained within a short period of time, and offers valuable information to your health care provider. The images from an echocardiogram can provide information such as:  Evidence of coronary artery disease (CAD).  Heart size.  Heart muscle function.  Heart valve function.  Aneurysm detection.  Evidence of a past heart attack.  Fluid buildup around the heart.  Heart muscle thickening.  Assess heart valve function.  Tell a health care provider about:  Any allergies you have.  All medicines you are taking, including vitamins, herbs, eye drops, creams, and over-the-counter medicines.  Any problems you or family members have had with anesthetic medicines.  Any blood disorders you have.  Any surgeries you have had.  Any medical conditions you have.  Whether you are pregnant or may be pregnant. What happens before the  procedure? No special preparation is needed. Eat and drink normally. What happens during the procedure?  In order to produce an image of your heart, gel will be applied to your chest and a wand-like tool (transducer) will be moved over your chest. The gel will help transmit the sound waves from the transducer. The sound waves will harmlessly bounce off your heart to allow the heart images to be captured in real-time motion. These images will then be recorded.  You may need an IV to receive a medicine that improves the quality of the pictures. What happens after the procedure? You may return to your normal schedule including diet, activities, and medicines, unless your health care provider tells you otherwise. This information is not intended to replace advice given to you by your health care provider. Make sure you discuss any questions you have with your health care provider. Document Released: 10/17/2000 Document Revised: 06/07/2016 Document Reviewed: 06/27/2013 Elsevier Interactive Patient Education  2017 [employer].

## 2017-06-30 NOTE — Patient Instructions (Signed)
Continue current medications. No changes made today  I will speak with Dr. Kirke Corin and await his input after his visit with you today  I'll up in 3-4 months

## 2017-06-30 NOTE — Progress Notes (Signed)
Cardiology Office Note   Date:  06/30/2017   ID:  Tami Skinner, Tami Skinner May 05, 1946, MRN [id number]  PCP:  Lorre Munroe, NP  Cardiologist:   Lorine Bears, MD   Chief Complaint  Patient presents with  . OTHER    12 month f/u c/o chest pain and sob. Meds reviewed verbally with pt.      History of Present Illness: Tami Skinner is a 71 y.o. female who presents for a follow-up visit for stress-induced cardiomyopathy. She was hospitalized in November, 2015 with chest pain and shortness of breath with mildly elevated cardiac enzymes. Cardiac catheterization showed Mild nonobstructive coronary artery disease. Ejection fraction was 40-45% with wall motion abnormality suggestive of stress-induced cardiomyopathy. She was under significant stress related to her husband being alcoholic. Echocardiogram in October 2016 showed normal LV systolic function and only mild pulmonary hypertension.  She has known history of severe COPD and quit smoking last year. She reports intermittent episodes of substernal chest tightness at rest and with physical activities. This has been associated with significant worsening of exertional dyspnea. She also reports palpitations and she is noted to have PVCs. Her husband continues to drink and she continues to be under significant amount of stress overall. She drinks 4-5 cups of coffee daily.   Past Medical History:  Diagnosis Date  . Anxiety   . Asthmatic bronchitis   . COPD (chronic obstructive pulmonary disease) (HCC)   . Depression   . Diabetes mellitus   . Diverticulitis   . Diverticulosis   . Hyperlipidemia   . Hypertension   . Osteopenia   . Sleep apnea   . Stress-induced cardiomyopathy    a. echo 09/28/2014: EF 45-50%, severe HK of mid-distal anterior, apical and mid-distal inferior walls suggestive of stress induced CM, mildly dilated LA, mildly dilated PASP, mild TR, trivial pericardial effusion    Past Surgical History:  Procedure Laterality  Date  . ABDOMINAL HYSTERECTOMY     partial  . BREAST BIOPSY Right    neg  . CARDIAC CATHETERIZATION  09/29/2014   [facility]  . NASAL SINUS SURGERY       Current Outpatient Prescriptions  Medication Sig Dispense Refill  . albuterol (PROVENTIL) (2.5 MG/3ML) 0.083% nebulizer solution Take 3 mLs (2.5 mg total) by nebulization every 6 (six) hours as needed for wheezing or shortness of breath. 75 mL 12  . albuterol (VENTOLIN HFA) 108 (90 Base) MCG/ACT inhaler INHALE TWO PUFFS INTO THE LUNGS EVERY FOUR HOURS AS NEEDED FOR WHEEZING 1 Inhaler 4  . ALPRAZolam (XANAX) 0.5 MG tablet TAKE 1 TABLET BY MOUTH EVERY 8 HOURS AS NEEDED 90 tablet 0  . Cholecalciferol (VITAMIN D-3) 1000 UNITS CAPS Take 2 capsules by mouth.     . clotrimazole-betamethasone (LOTRISONE) cream APPLY TO AFFECTED AREA TWICE A DAY 45 g 0  . Fluticasone-Salmeterol (ADVAIR DISKUS) 250-50 MCG/DOSE AEPB Inhale 1 puff into the lungs 2 (two) times daily. 60 each 10  . glucose blood (ONE TOUCH ULTRA TEST) test strip 1 each by Other route 2 (two) times daily. Use as instructed 100 each 5  . metFORMIN (GLUCOPHAGE) 500 MG tablet TAKE 1 TABLET BY MOUTH TWICE A DAY WITH A MEAL 180 tablet 3  . ondansetron (ZOFRAN) 4 MG tablet Take 1 tablet (4 mg total) by mouth every 8 (eight) hours as needed. 20 tablet 0  . OXYGEN-HELIUM IN Inhale 2 % into the lungs at bedtime.      . rosuvastatin (CRESTOR) 5 MG tablet  TAKE 1 TABLET (5 MG TOTAL) BY MOUTH DAILY. 90 tablet 1  . sertraline (ZOLOFT) 100 MG tablet TAKE 1 AND 1/2 TABLET BY MOUTH TWICE A DAY 270 tablet 1  . SPIRIVA HANDIHALER 18 MCG inhalation capsule INHALE 1 CAPSULE VIA HANDIHALER DAILY 90 capsule 1  . losartan (COZAAR) 50 MG tablet Take 1 tablet (50 mg total) by mouth daily. 90 tablet 3  . metoprolol succinate (TOPROL-XL) 50 MG 24 hr tablet Take 1 tablet (50 mg total) by mouth daily. Take with or immediately following a meal. 90 tablet 3   No current facility-administered medications for this visit.       Allergies:   Amoxicillin-pot clavulanate; Daliresp [roflumilast]; Erythromycin base; and Neurontin [gabapentin]    Social History:  The patient  reports that she quit smoking about 9 months ago. Her smoking use included Cigarettes. She has a 10.00 pack-year smoking history. She has never used smokeless tobacco. She reports that she does not drink alcohol or use drugs.   Family History:  family history includes Breast cancer in her maternal aunt and maternal grandmother; COPD in her father and mother; Cancer in her cousin; Diabetes in her daughter and maternal grandmother; Kidney disease in her daughter; Kidney failure in her father; Lung cancer in her mother; Prostate cancer in her father.    ROS:  Please see the history of present illness.   Otherwise, review of systems are positive for none.   All other systems are reviewed and negative.    PHYSICAL EXAM: VS:  BP 90/60 (BP Location: Left Arm, Patient Position: Sitting, Cuff Size: Normal)   Pulse 79   Ht 5\' 5"  (1.651 m)   Wt 157 lb (71.2 kg)   BMI 26.13 kg/m  , BMI Body mass index is 26.13 kg/m. GEN: Well nourished, well developed, in no acute distress  HEENT: normal  Neck: no JVD, carotid bruits, or masses Cardiac: RRR; no murmurs, rubs, or gallops,no edema  Respiratory:  clear to auscultation bilaterally, normal work of breathing GI: soft, nontender, nondistended, + BS MS: no deformity or atrophy  Skin: warm and dry, no rash Neuro:  Strength and sensation are intact Psych: euthymic mood, full affect   EKG:  EKG is ordered today. The ekg ordered today demonstrates normal sinus rhythm with frequent PVCs and left axis deviation.   Recent Labs: 10/31/2016: ALT 12; BUN 14; Creat 0.82; Hemoglobin 13.6; Platelets 292; Potassium 4.2; Sodium 138    Lipid Panel    Component Value Date/Time   CHOL 156 01/20/2017 1407   CHOL 110 09/28/2014 0637   TRIG 59.0 01/20/2017 1407   TRIG 102 09/28/2014 0637   HDL 82.90 01/20/2017  1407   HDL 54 09/28/2014 0637   CHOLHDL 2 01/20/2017 1407   VLDL 11.8 01/20/2017 1407   VLDL 20 09/28/2014 0637   LDLCALC 61 01/20/2017 1407   LDLCALC 36 09/28/2014 0637   LDLDIRECT 144.4 10/11/2013 1102      Wt Readings from Last 3 Encounters:  06/30/17 157 lb (71.2 kg)  06/30/17 157 lb (71.2 kg)  06/16/17 152 lb (68.9 kg)         ASSESSMENT AND PLAN:  1.  History of stress-induced cardiomyopathy: I'm concerned about recurrent stressful events and possibility of worsening ejection fraction. Her EF improved to normal in 2016. I requested an echocardiogram for evaluation given her new symptoms. I elected to decrease losartan to 50 mg daily in order to increase Toprol to 50 mg once daily.  2. Chest  tightness: Some of her symptoms are suggestive of angina. However, previous cardiac catheterization 2015 showed only mild nonobstructive disease. I'm concerned that her symptoms might be related to recurrent cardiomyopathy or pulmonary hypertension given underlying COPD. An echocardiogram was requested as outlined above. I made changes in her medications. If no improvement, stress testing can be considered.  3. Symptomatic PVCs: I advised her to cut down on caffeine intake. I increased Toprol.  4. Essential hypertension: Blood pressure is controlled and if anything gets low. Losartan was decreased.  3. Hyperlipidemia: Continue small dose rosuvastatin.    Disposition:   FU with me in 1 month  Signed,  Lorine Bears, MD  06/30/2017 3:40 PM    [facility]

## 2017-07-02 NOTE — Progress Notes (Signed)
PULMONARY OFFICE FOLLOW UP NOTE  Requesting MD/Service: Self Date of initial consultation: 09/29/16 Reason for consultation: COPD, smoker  PT PROFILE: 89 F smoker (abstinent X 1 month) previously followed by Dr Kendrick FriesMcQuaid for lung nodule and moderate COPD. Maintained on Spiriva, Advair, PRN albuterol with class III dyspnea  DATA: Spirometry 02/03/12: Moderate obstruction (FEV1 1.21 liters, 49% pred) CT chest 09/27/14: Stable right lower lobe pulmonary nodule over over 2 year interval consistent benign etiology PFTs 11/11/16: mod-severe obstruction (FEV1 0.95 liters, 44% pred), mild restriction, severe reduction in DLCO. Lung volumes are porbably underestimated by inadequate test performance CXR 11/28/16: hyperinflation. NACPD  INTERVAL: No major events  SUBJ: This is a routine re-eval. She was in USOH until past couple of weeks when she has developed increasing episodes of chest tightness and pain. These episodes are associated with increased dyspnea but are not preceded by dyspnea. They are not necessarily exertional. Denies fever, purulent sputum, hemoptysis, LE edema and calf tenderness.  MEDICATIONS: I have reviewed all medications and confirmed regimen as documented  Vitals:   06/30/17 1131 06/30/17 1136  BP:  108/70  Pulse:  72  Resp: 16   SpO2:  94%  Weight: 71.2 kg (157 lb)   Height: 5' 4.5" (1.638 m)    RA  EXAM:  Gen: WDWN, No overt respiratory distress HEENT: NCAT, sclera white, oropharynx normal Neck: Supple without LAN, thyromegaly, JVD Thorax - moderate kyphoscoliosis Lungs: breath sounds moderately diminished, percussion normal, No wheezes Cardiovascular: Reg, no murmurs Abdomen: Soft, nontender, normal BS Ext: without clubbing, cyanosis, edema Neuro: CNs grossly intact, motor and sensory intact Skin: Limited exam, no lesions noted  DATA:   BMP Latest Ref Rng & Units 10/31/2016 05/20/2016 10/17/2015  Glucose 65 - 99 mg/dL 161(W111(H) 960(A129(H) 540(J111(H)  BUN 7 - 25  mg/dL 14 16 13   Creatinine 0.60 - 0.93 mg/dL 8.110.82 9.140.75 7.820.85  Sodium 135 - 146 mmol/L 138 138 138  Potassium 3.5 - 5.3 mmol/L 4.2 4.3 4.8  Chloride 98 - 110 mmol/L 99 102 101  CO2 20 - 31 mmol/L 23 31 32  Calcium 8.6 - 10.4 mg/dL 95.610.2 21.310.1 9.8    CBC Latest Ref Rng & Units 10/31/2016 05/20/2016 09/30/2014  WBC 3.8 - 10.8 K/uL 6.3 6.5 6.1  Hemoglobin 11.7 - 15.5 g/dL 08.613.6 57.813.9 46.913.0  Hematocrit 35.0 - 45.0 % 41.5 41.5 39.0  Platelets 140 - 400 K/uL 292 201.0 234    CXR (06/16/17):  No acute findings  IMPRESSION:   1) Former Smoker - Previously 1/2 - 3 ppd 2) Mod-severe COPD with class II/III dyspnea  3) Exertional hypoxemia 4) OSA with nocturnal hypoxemia - well treated with CPAP and nocturnal O2 5) New onset chest pain   PLAN:  1) Continue Spiriva and Advair as maintenance inhalers 2) Continue Ventolin as rescue inhaler 3) continue albuterol nebulizer as emergency medication if the Ventolin does not give sufficient relief 4) Continue O2 and CPAPwith sleep. Cont O2 with exertion 5) I spoke with Dr Kirke CorinArida and expressed my concern that her current symptoms of chest pain do not seem to be of pulmonary origin 6) Follow up in 3-4 months or PRN   Billy Fischeravid Simonds, MD PCCM service Mobile [phone] Pager [phone] 07/02/2017

## 2017-07-03 ENCOUNTER — Other Ambulatory Visit: Payer: Self-pay | Admitting: Internal Medicine

## 2017-07-03 DIAGNOSIS — Z1231 Encounter for screening mammogram for malignant neoplasm of breast: Secondary | ICD-10-CM

## 2017-07-04 ENCOUNTER — Other Ambulatory Visit: Payer: Self-pay | Admitting: Internal Medicine

## 2017-07-09 ENCOUNTER — Ambulatory Visit (INDEPENDENT_AMBULATORY_CARE_PROVIDER_SITE_OTHER): Payer: Medicare Other

## 2017-07-09 ENCOUNTER — Other Ambulatory Visit: Payer: Self-pay

## 2017-07-09 ENCOUNTER — Other Ambulatory Visit: Payer: Self-pay | Admitting: Internal Medicine

## 2017-07-09 DIAGNOSIS — R0602 Shortness of breath: Secondary | ICD-10-CM

## 2017-07-10 NOTE — Telephone Encounter (Signed)
Last filled 10/2016... Please advise if appropriate for refill

## 2017-07-14 ENCOUNTER — Other Ambulatory Visit: Payer: Self-pay | Admitting: Internal Medicine

## 2017-07-14 ENCOUNTER — Encounter: Payer: Self-pay | Admitting: Internal Medicine

## 2017-07-14 MED ORDER — METFORMIN HCL 500 MG PO TABS: ORAL_TABLET | ORAL | 1 refills | Status: DC

## 2017-07-15 MED ORDER — ONDANSETRON HCL 4 MG PO TABS: 4.0000 mg | ORAL_TABLET | Freq: Three times a day (TID) | ORAL | 0 refills | Status: DC | PRN

## 2017-07-19 ENCOUNTER — Other Ambulatory Visit: Payer: Self-pay | Admitting: Primary Care

## 2017-07-20 ENCOUNTER — Encounter: Payer: Self-pay | Admitting: Pulmonary Disease

## 2017-07-20 NOTE — Telephone Encounter (Signed)
Per pt:  I'm not on an exact 8 hr dosage schedule.  I only take them when I need them.  Dosage wording was changed when I requested my first routine refill to [facility] on [address] after Dr. Dan Humphreys moved.  You might want to check my chart.  I have been taking 2-3 daily as needed for years and would appreciate keeping that same schedule. If Rene Kocher needs for me to come in to discuss, please just let me know. Thank you and y'all be safe!    Due to the level of anxiety I experience, especially now with breathing/heart  issues

## 2017-07-20 NOTE — Telephone Encounter (Signed)
Ok to refill? Electronically refill request for ALPRAZolam Tami Skinner) 0.5 MG tablet  Last prescribed from [place] on 06/09/2017. Last see on 06/16/2017

## 2017-07-20 NOTE — Telephone Encounter (Signed)
Call pt:  Would she be willing to decrease Xanax to BID prn?

## 2017-07-20 NOTE — Telephone Encounter (Signed)
Rx called in to pharmacy. 

## 2017-07-20 NOTE — Telephone Encounter (Signed)
Ok to phone in Xanax 

## 2017-07-30 ENCOUNTER — Encounter: Payer: Self-pay | Admitting: Student

## 2017-07-30 ENCOUNTER — Ambulatory Visit (INDEPENDENT_AMBULATORY_CARE_PROVIDER_SITE_OTHER): Payer: Medicare Other | Admitting: Student

## 2017-07-30 VITALS — BP 120/60 | HR 63 | Ht 64.0 in | Wt 156.0 lb

## 2017-07-30 DIAGNOSIS — E782 Mixed hyperlipidemia: Secondary | ICD-10-CM

## 2017-07-30 DIAGNOSIS — I10 Essential (primary) hypertension: Secondary | ICD-10-CM

## 2017-07-30 DIAGNOSIS — I5181 Takotsubo syndrome: Secondary | ICD-10-CM

## 2017-07-30 DIAGNOSIS — R0789 Other chest pain: Secondary | ICD-10-CM | POA: Diagnosis not present

## 2017-07-30 DIAGNOSIS — R0609 Other forms of dyspnea: Secondary | ICD-10-CM

## 2017-07-30 DIAGNOSIS — J449 Chronic obstructive pulmonary disease, unspecified: Secondary | ICD-10-CM | POA: Diagnosis not present

## 2017-07-30 NOTE — Patient Instructions (Addendum)
Medication Instructions:  Your physician recommends that you continue on your current medications as directed. Please refer to the Current Medication list given to you today.   Labwork: none  Testing/Procedures: Your physician has requested that you have a lexiscan myoview. For further information please visit [email or web address] Please follow instruction sheet, as given.  [facility] MYOVIEW  Your caregiver has ordered a Stress Test with nuclear imaging. The purpose of this test is to evaluate the blood supply to your heart muscle. This procedure is referred to as a "Non-Invasive Stress Test." This is because other than having an IV started in your vein, nothing is inserted or "invades" your body. Cardiac stress tests are done to find areas of poor blood flow to the heart by determining the extent of coronary artery disease (CAD). Some patients exercise on a treadmill, which naturally increases the blood flow to your heart, while others who are  unable to walk on a treadmill due to physical limitations have a pharmacologic/chemical stress agent called Lexiscan . This medicine will mimic walking on a treadmill by temporarily increasing your coronary blood flow.   Please note: these test may take anywhere between 2-4 hours to complete  PLEASE REPORT TO [facility] MEDICAL MALL ENTRANCE  THE VOLUNTEERS AT THE FIRST DESK WILL DIRECT YOU WHERE TO GO  Date of Procedure:___Tuesday, Oct 2________  Arrival Time for Procedure:__7:45am_______  Instructions regarding medication:   _xx___ : Hold metformin medication morning of procedure  __xx__:  Hold metoprolol the night before procedure and morning of procedure  Please bring your inhalers to the test.  PLEASE NOTIFY THE OFFICE AT LEAST 24 HOURS IN ADVANCE IF YOU ARE UNABLE TO KEEP YOUR APPOINTMENT.  [phone] AND  PLEASE NOTIFY NUCLEAR MEDICINE AT [facility] AT LEAST 24 HOURS IN ADVANCE IF YOU ARE UNABLE TO KEEP YOUR APPOINTMENT. [phone]  How to  prepare for your Myoview test:  1. Do not eat or drink after midnight 2. No caffeine for 24 hours prior to test 3. No smoking 24 hours prior to test. 4. Your medication may be taken with water.  If your doctor stopped a medication because of this test, do not take that medication. 5. Ladies, please do not wear dresses.  Skirts or pants are appropriate. Please wear a short sleeve shirt. 6. No perfume, cologne or lotion. 7. Wear comfortable walking shoes. No heels!            Follow-Up: Your physician wants you to follow-up in: one year with Dr. Kirke Corin.  You will receive a reminder letter in the mail two months in advance. If you don't receive a letter, please call our office to schedule the follow-up appointment.   Any Other Special Instructions Will Be Listed Below (If Applicable).     If you need a refill on your cardiac medications before your next appointment, please call your pharmacy.  Cardiac Nuclear Scan A cardiac nuclear scan is a test that measures blood flow to the heart when a person is resting and when he or she is exercising. The test looks for problems such as:  Not enough blood reaching a portion of the heart.  The heart muscle not working normally.  You may need this test if:  You have heart disease.  You have had abnormal lab results.  You have had heart surgery or angioplasty.  You have chest pain.  You have shortness of breath.  In this test, a radioactive dye (tracer) is injected into your bloodstream. After the tracer has  traveled to your heart, an imaging device is used to measure how much of the tracer is absorbed by or distributed to various areas of your heart. This procedure is usually done at a hospital and takes 2-4 hours. Tell a health care provider about:  Any allergies you have.  All medicines you are taking, including vitamins, herbs, eye drops, creams, and over-the-counter medicines.  Any problems you or family members have had  with the use of anesthetic medicines.  Any blood disorders you have.  Any surgeries you have had.  Any medical conditions you have.  Whether you are pregnant or may be pregnant. What are the risks? Generally, this is a safe procedure. However, problems may occur, including:  Serious chest pain and heart attack. This is only a risk if the stress portion of the test is done.  Rapid heartbeat.  Sensation of warmth in your chest. This usually passes quickly.  What happens before the procedure?  Ask your health care provider about changing or stopping your regular medicines. This is especially important if you are taking diabetes medicines or blood thinners.  Remove your jewelry on the day of the procedure. What happens during the procedure?  An IV tube will be inserted into one of your veins.  Your health care provider will inject a small amount of radioactive tracer through the tube.  You will wait for 20-40 minutes while the tracer travels through your bloodstream.  Your heart activity will be monitored with an electrocardiogram (ECG).  You will lie down on an exam table.  Images of your heart will be taken for about 15-20 minutes.  You may be asked to exercise on a treadmill or stationary bike. While you exercise, your heart's activity will be monitored with an ECG, and your blood pressure will be checked. If you are unable to exercise, you may be given a medicine to increase blood flow to parts of your heart.  When blood flow to your heart has peaked, a tracer will again be injected through the IV tube.  After 20-40 minutes, you will get back on the exam table and have more images taken of your heart.  When the procedure is over, your IV tube will be removed. The procedure may vary among health care providers and hospitals. Depending on the type of tracer used, scans may need to be repeated 3-4 hours later. What happens after the procedure?  Unless your health care  provider tells you otherwise, you may return to your normal schedule, including diet, activities, and medicines.  Unless your health care provider tells you otherwise, you may increase your fluid intake. This will help flush the contrast dye from your body. Drink enough fluid to keep your urine clear or pale yellow.  It is up to you to get your test results. Ask your health care provider, or the department that is doing the test, when your results will be ready. Summary  A cardiac nuclear scan measures the blood flow to the heart when a person is resting and when he or she is exercising.  You may need this test if you are at risk for heart disease.  Tell your health care provider if you are pregnant.  Unless your health care provider tells you otherwise, increase your fluid intake. This will help flush the contrast dye from your body. Drink enough fluid to keep your urine clear or pale yellow. This information is not intended to replace advice given to you by your health care  provider. Make sure you discuss any questions you have with your health care provider. Document Released: 11/14/2004 Document Revised: 10/22/2016 Document Reviewed: 09/28/2013 Elsevier Interactive Patient Education  2017 [employer].

## 2017-07-30 NOTE — Progress Notes (Signed)
Cardiology Office Note    Date:  07/30/2017   ID:  Tami Skinner 05/05/1946, MRN [id number]  PCP:  Tami Munroe, NP  Cardiologist: Dr. Kirke Corin  Chief Complaint  Patient presents with  . other    follow up from ECHO. Patient c/o occ. pain on left side of chest and SOB.  Meds reviewed verbally with patient.     History of Present Illness:    Tami Skinner is a 71 y.o. 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   She was last examined by Dr. Kirke Corin in 06/2017 and reported intermittent episodes of chest tightness occurring at rest or with exertion and exertional dyspnea. Her Toprol-XL was increased to  daily to assist with palpitations and a repeat echocardiogram was obtained. This showed a preserved EF of 60-65% with no regional WMA. Grade 1 DD was noted with pulmonary pressures within a normal range.   In talking with the patient today, she reports having worsening dyspnea on exertion for the past several months with associated fatigue. Reports being evaluated by her Pulmonologist who thought her symptoms were not fully secondary to COPD.   She does note occasional episodes of shooting chest pain which occurs at rest or with activity and spontaneously resolves within 30 seconds. No radiating pain to her neck, arm, or back. Does note occasional nausea with the episodes. No recent palpitations, orthopnea, PND, or lower extremity edema.   She does not check her BP regularly but it is well-controlled at 120/60 during today's visit. Reports good compliance with her medication regimen.    Past Medical History:  Diagnosis Date  . Anxiety   . Asthmatic bronchitis   . COPD (chronic obstructive pulmonary disease) (HCC)   . Depression   . Diabetes mellitus   . Diverticulitis   . Diverticulosis   . Hyperlipidemia   . Hypertension     . Osteopenia   . Sleep apnea   . Stress-induced cardiomyopathy    a. echo 09/28/2014: EF 45-50%, severe HK of mid-distal anterior, apical and mid-distal inferior walls suggestive of stress induced CM, mildly dilated LA, mildly dilated PASP, mild TR, trivial pericardial effusion    Past Surgical History:  Procedure Laterality Date  . ABDOMINAL HYSTERECTOMY     partial  . BREAST BIOPSY Right    neg  . CARDIAC CATHETERIZATION  09/29/2014   [facility]  . NASAL SINUS SURGERY      Current Medications: Outpatient Medications Prior to Visit  Medication Sig Dispense Refill  . albuterol (PROVENTIL) (2.5 MG/3ML) 0.083% nebulizer solution Take 3 mLs (2.5 mg total) by nebulization every 6 (six) hours as needed for wheezing or shortness of breath. 75 mL 12  . albuterol (VENTOLIN HFA) 108 (90 Base) MCG/ACT inhaler INHALE TWO PUFFS INTO THE LUNGS EVERY FOUR HOURS AS NEEDED FOR WHEEZING 1 Inhaler 4  . ALPRAZolam (XANAX) 0.5 MG tablet TAKE 1 TABLET BY MOUTH EVERY 8 HOURS AS NEEDED 90 tablet 0  . Cholecalciferol (VITAMIN D-3) 1000 UNITS CAPS Take 2 capsules by mouth.     . clotrimazole-betamethasone (LOTRISONE) cream APPLY TO AFFECTED AREA TWICE A DAY 45 g 0  . Fluticasone-Salmeterol (ADVAIR DISKUS) 250-50 MCG/DOSE AEPB Inhale 1 puff into the lungs 2 (two) times daily. 60 each 10  . glucose blood (ONE TOUCH ULTRA TEST) test strip 1 each by Other route 2 (two)  times daily. Use as instructed 100 each 5  . losartan (COZAAR) 50 MG tablet Take 1 tablet (50 mg total) by mouth daily. 90 tablet 3  . metFORMIN (GLUCOPHAGE) 500 MG tablet TAKE 1 TABLET BY MOUTH TWICE A DAY WITH A MEAL 180 tablet 1  . metoprolol succinate (TOPROL-XL) 50 MG 24 hr tablet Take 1 tablet (50 mg total) by mouth daily. Take with or immediately following a meal. 90 tablet 3  . ondansetron (ZOFRAN) 4 MG tablet Take 1 tablet (4 mg total) by mouth every 8 (eight) hours as needed. 20 tablet 0  . OXYGEN-HELIUM IN Inhale 2 % into the lungs at  bedtime.      . rosuvastatin (CRESTOR) 5 MG tablet TAKE 1 TABLET (5 MG TOTAL) BY MOUTH DAILY. 90 tablet 1  . sertraline (ZOLOFT) 100 MG tablet TAKE 1 AND 1/2 TABLET BY MOUTH TWICE A DAY 270 tablet 1  . SPIRIVA HANDIHALER 18 MCG inhalation capsule INHALE 1 CAPSULE VIA HANDIHALER ONCE DAILY AT THE SAME TIME EVERY DAY 90 capsule 1   No facility-administered medications prior to visit.      Allergies:   Amoxicillin-pot clavulanate; Daliresp [roflumilast]; Erythromycin base; and Neurontin [gabapentin]   Social History   Social History  . Marital status: Married    Spouse name: N/A  . Number of children: N/A  . Years of education: N/A   Social History Main Topics  . Smoking status: Former Smoker    Packs/day: 0.25    Years: 40.00    Types: Cigarettes    Quit date: 09/01/2016  . Smokeless tobacco: Never Used     Comment: Uses nicotrol inhaler on occasion  . Alcohol use No  . Drug use: No  . Sexual activity: Not Currently   Other Topics Concern  . None   Social History Narrative   Lives with husband in [place]. 2 dogs. Works at [employer].     Family History:  The patient's family history includes Breast cancer in her maternal aunt and maternal grandmother; COPD in her father and mother; Cancer in her cousin; Diabetes in her daughter and maternal grandmother; Kidney disease in her daughter; Kidney failure in her father; Lung cancer in her mother; Prostate cancer in her father.   Review of Systems:   Please see the history of present illness.     General:  No chills, fever, night sweats or weight changes.  Cardiovascular:  No edema, orthopnea, palpitations, paroxysmal nocturnal dyspnea. Positive for chest pain and dyspnea on exertion.  Dermatological: No rash, lesions/masses Respiratory: No cough, Positive for dyspnea. Urologic: No hematuria, dysuria Abdominal:   No nausea, vomiting, diarrhea, bright red blood per rectum, melena, or hematemesis Neurologic:  No visual changes, wkns,  changes in mental status. All other systems reviewed and are otherwise negative except as noted above.   Physical Exam:    VS:  BP 120/60 (BP Location: Left Arm, Patient Position: Sitting, Cuff Size: Normal)   Pulse 63   Ht  (1.626 m)   Wt 156 lb (70.8 kg)   SpO2 94%   BMI 26.78 kg/m     General: Well developed, well nourished Caucasian female appearing in no acute distress. Head: Normocephalic, atraumatic, sclera non-icteric, no xanthomas, nares are without discharge.  Neck: No carotid bruits. JVD not elevated.  Lungs: Respirations regular and unlabored, without wheezes or rales.  Heart: Regular rate and rhythm. No S3 or S4.  No murmur, no rubs, or gallops appreciated. Abdomen: Soft, non-tender, non-distended with  normoactive bowel sounds. No hepatomegaly. No rebound/guarding. No obvious abdominal masses. Msk:  Strength and tone appear normal for age. No joint deformities or effusions. Extremities: No clubbing or cyanosis. No lower extremity edema.  Distal pedal pulses are 2+ bilaterally. Varicose veins identified.  Neuro: Alert and oriented X 3. Moves all extremities spontaneously. No focal deficits noted. Psych:  Responds to questions appropriately with a normal affect. Skin: No rashes or lesions noted  Wt Readings from Last 3 Encounters:  07/30/17 156 lb (70.8 kg)  06/30/17 157 lb (71.2 kg)  06/30/17 157 lb (71.2 kg)     Studies/Labs Reviewed:   EKG:  EKG is not ordered today.    Recent Labs: 10/31/2016: ALT 12; BUN 14; Creat 0.82; Hemoglobin 13.6; Platelets 292; Potassium 4.2; Sodium 138   Lipid Panel    Component Value Date/Time   CHOL 156 01/20/2017 1407   CHOL 110 09/28/2014 0637   TRIG 59.0 01/20/2017 1407   TRIG 102 09/28/2014 0637   HDL 82.90 01/20/2017 1407   HDL 54 09/28/2014 0637   CHOLHDL 2 01/20/2017 1407   VLDL 11.8 01/20/2017 1407   VLDL 20 09/28/2014 0637   LDLCALC 61 01/20/2017 1407   LDLCALC 36 09/28/2014 0637   LDLDIRECT 144.4 10/11/2013  1102    Additional studies/ records that were reviewed today include:   Echocardiogram: 07/09/2017 Study Conclusions  - Left ventricle: The cavity size was normal. Systolic function was   normal. The estimated ejection fraction was in the range of 60%   to 65%. Wall motion was normal; there were no regional wall   motion abnormalities. Doppler parameters are consistent with   abnormal left ventricular relaxation (grade 1 diastolic   dysfunction). - Left atrium: The atrium was normal in size. - Right ventricle: Systolic function was normal. - Pulmonary arteries: Systolic pressure was within the normal   range.  Cardiac Catheterization: 09/2014   Assessment:    1. DOE (dyspnea on exertion)   2. Atypical chest pain   3. Stress-induced cardiomyopathy   4. Essential hypertension, benign   5. Mixed hyperlipidemia   6. Chronic obstructive pulmonary disease, unspecified COPD type (HCC)      Plan:   In order of problems listed above:  1. Dyspnea on Exertion/ Atypical Chest Pain  - she reports worsening fatigue and dyspnea on exertion for the past several months. Evaluated by Pulmonology and symptoms were thought to be atypical for a pulmonary origin.  - recent EKG showed no acute ischemic changes. Echo showed a preserved EF of 60-65% with no regional WMA.  - she does report episodes of chest pain which she describes as a shooting pain and lasting for upwards of 30 seconds at a time.  - discussed with the patient that her chest pain seems atypical for a cardiac etiology but her dyspnea on exertion would be the most concerning aspect of her symptomology. Last ischemic evaluation was a cardiac catheterization which showed minimal luminal irregularities which I reviewed with her in detail. Will pursue a Lexiscan Myoview to rule out an ischemic etiology of her symptoms.   2. Stress-induced cardiomyopathy - EF 40-45% in 2015 with cath showing nonobstructive CAD. Recent repeat echo  showed a preserved EF of 60-65% with no regional WMA.  - no evidence of volume overload by physical examination.  - continue BB and ARB.   3. HTN - BP is well-controlled at 120/60 during today's visit.  - continue Losartan  daily and Toprol-XL  daily.  4. HLD - Lipid Panel in 01/2017 showed total cholesterol of 156, HDL 82, and LDL 61.  - continue Crestor  daily.   5. COPD - followed by Dr. Sung Amabile.     Medication Adjustments/Labs and Tests Ordered: Current medicines are reviewed at length with the patient today.  Concerns regarding medicines are outlined above.  Medication changes, Labs and Tests ordered today are listed in the Patient Instructions below. Patient Instructions  Medication Instructions:  Your physician recommends that you continue on your current medications as directed. Please refer to the Current Medication list given to you today.  Labwork: None  Testing/Procedures: Your physician has requested that you have a lexiscan myoview. For further information please visit [email or web address] Please follow instruction sheet, as given.   Signed, Ellsworth Lennox, PA-C  07/30/2017 3:43 PM    [facility] [address] [place], [address] [place], [place]  [address] Phone: [phone]; Fax: [phone]  [address], [address] [place], [place] [address] Phone: [phone]

## 2017-08-04 ENCOUNTER — Encounter
Admission: RE | Admit: 2017-08-04 | Discharge: 2017-08-04 | Disposition: A | Discharge status: Home / Self Care | Payer: Medicare Other | Source: Ambulatory Visit | Attending: Student | Admitting: Student

## 2017-08-04 DIAGNOSIS — R0609 Other forms of dyspnea: Secondary | ICD-10-CM | POA: Diagnosis not present

## 2017-08-04 DIAGNOSIS — R0789 Other chest pain: Secondary | ICD-10-CM | POA: Insufficient documentation

## 2017-08-04 LAB — NM MYOCAR MULTI W/SPECT W/WALL MOTION / EF
CSEPPHR: 85 {beats}/min
LVDIAVOL: 58 mL (ref 46–106)
LVSYSVOL: 18 mL
NUC STRESS TID: 0.83
Percent HR: 57 %
Rest HR: 67 {beats}/min

## 2017-08-04 MED ORDER — TECHNETIUM TC 99M TETROFOSMIN IV KIT
13.0000 | PACK | Freq: Once | INTRAVENOUS | Status: AC | PRN
  Administered 2017-08-04: 13.58 via INTRAVENOUS

## 2017-08-04 MED ORDER — TECHNETIUM TC 99M TETROFOSMIN IV KIT
31.6170 | PACK | Freq: Once | INTRAVENOUS | Status: AC | PRN
  Administered 2017-08-04: 31.617 via INTRAVENOUS

## 2017-08-04 MED ORDER — REGADENOSON 0.4 MG/5ML IV SOLN
0.4000 mg | Freq: Once | INTRAVENOUS | Status: AC
  Administered 2017-08-04: 0.4 mg via INTRAVENOUS

## 2017-08-10 ENCOUNTER — Encounter: Payer: Self-pay | Admitting: Internal Medicine

## 2017-08-21 ENCOUNTER — Other Ambulatory Visit: Payer: Self-pay | Admitting: Internal Medicine

## 2017-08-21 ENCOUNTER — Encounter: Payer: Self-pay | Admitting: Internal Medicine

## 2017-08-21 ENCOUNTER — Ambulatory Visit (INDEPENDENT_AMBULATORY_CARE_PROVIDER_SITE_OTHER): Payer: Medicare Other | Admitting: Internal Medicine

## 2017-08-21 VITALS — BP 134/68 | HR 70 | Temp 98.4°F | Ht 64.0 in | Wt 156.8 lb

## 2017-08-21 DIAGNOSIS — B372 Candidiasis of skin and nail: Secondary | ICD-10-CM | POA: Diagnosis not present

## 2017-08-21 DIAGNOSIS — F419 Anxiety disorder, unspecified: Secondary | ICD-10-CM

## 2017-08-21 DIAGNOSIS — Z23 Encounter for immunization: Secondary | ICD-10-CM

## 2017-08-21 DIAGNOSIS — R110 Nausea: Secondary | ICD-10-CM

## 2017-08-21 DIAGNOSIS — R0602 Shortness of breath: Secondary | ICD-10-CM | POA: Diagnosis not present

## 2017-08-21 MED ORDER — SERTRALINE HCL 100 MG PO TABS: ORAL_TABLET | ORAL | 1 refills | Status: DC

## 2017-08-21 MED ORDER — ONDANSETRON HCL 4 MG PO TABS: 4.0000 mg | ORAL_TABLET | Freq: Three times a day (TID) | ORAL | 1 refills | Status: DC | PRN

## 2017-08-21 NOTE — Patient Instructions (Signed)
Panic Attacks Panic attacks are sudden, short feelings of great fear or discomfort. You may have them for no reason when you are relaxed, when you are uneasy (anxious), or when you are sleeping. Follow these instructions at home:  Take all your medicines as told.  Check with your doctor before starting new medicines.  Keep all doctor visits. Contact a doctor if:  You are not able to take your medicines as told.  Your symptoms do not get better.  Your symptoms get worse. Get help right away if:  Your attacks seem different than your normal attacks.  You have thoughts about hurting yourself or others.  You take panic attack medicine and you have a side effect. This information is not intended to replace advice given to you by your health care provider. Make sure you discuss any questions you have with your health care provider. Document Released: 11/22/2010 Document Revised: 03/27/2016 Document Reviewed: 06/03/2013 Elsevier Interactive Patient Education  2017 Elsevier Inc.  

## 2017-08-21 NOTE — Progress Notes (Signed)
Subjective:    Patient ID: Tami Skinner, female    DOB: 1946/03/16, 71 y.o.   MRN: [id number]  HPI  Pt presents to the clinic today with c/o SOB. This has been going on for about 2 months. She reports she has seen her pulmonologist for the same. She has COPD with CHRF, on oxygen as needed. Her pulmonologist felt like this may be heart related. She had an appt with cardiology the same day. Her ECG was not acutely abnormal, ultrasound and stress were normal. The cardiologist felt like her shortness of breath was anxiety related. She is not sure what triggers this. She reports if she takes a Xanax and sit down with her oxygen, and her symptoms resolve. She is taking Zoloft. She denies depression, SI/HI. She has seen Dr. Laymond Purser.  She also wants a refill on her Zofran. She reports chronic nausea. She denies reflux. Her bowels are moving normally.  She also wants to discuss the rash under her left breast. She has been putting Lotrison cream on it with minimal relief. She has not noticed much improvement. She reports the rash burns.   Review of Systems  Past Medical History:  Diagnosis Date  . Anxiety   . Asthmatic bronchitis   . COPD (chronic obstructive pulmonary disease) (HCC)   . Depression   . Diabetes mellitus   . Diverticulitis   . Diverticulosis   . Hyperlipidemia   . Hypertension   . Osteopenia   . Sleep apnea   . Stress-induced cardiomyopathy    a. echo 09/28/2014: EF 45-50%, severe HK of mid-distal anterior, apical and mid-distal inferior walls suggestive of stress induced CM, mildly dilated LA, mildly dilated PASP, mild TR, trivial pericardial effusion    Current Outpatient Prescriptions  Medication Sig Dispense Refill  . albuterol (PROVENTIL) (2.5 MG/3ML) 0.083% nebulizer solution Take 3 mLs (2.5 mg total) by nebulization every 6 (six) hours as needed for wheezing or shortness of breath. 75 mL 12  . albuterol (VENTOLIN HFA) 108 (90 Base) MCG/ACT inhaler INHALE TWO PUFFS  INTO THE LUNGS EVERY FOUR HOURS AS NEEDED FOR WHEEZING 1 Inhaler 4  . ALPRAZolam (XANAX) 0.5 MG tablet TAKE 1 TABLET BY MOUTH EVERY 8 HOURS AS NEEDED 90 tablet 0  . Cholecalciferol (VITAMIN D-3) 1000 UNITS CAPS Take 2 capsules by mouth.     . clotrimazole-betamethasone (LOTRISONE) cream APPLY TO AFFECTED AREA TWICE A DAY 45 g 0  . Fluticasone-Salmeterol (ADVAIR DISKUS) 250-50 MCG/DOSE AEPB Inhale 1 puff into the lungs 2 (two) times daily. 60 each 10  . glucose blood (ONE TOUCH ULTRA TEST) test strip 1 each by Other route 2 (two) times daily. Use as instructed 100 each 5  . losartan (COZAAR) 50 MG tablet Take 1 tablet (50 mg total) by mouth daily. 90 tablet 3  . metFORMIN (GLUCOPHAGE) 500 MG tablet TAKE 1 TABLET BY MOUTH TWICE A DAY WITH A MEAL 180 tablet 1  . metoprolol succinate (TOPROL-XL) 50 MG 24 hr tablet Take 1 tablet (50 mg total) by mouth daily. Take with or immediately following a meal. 90 tablet 3  . ondansetron (ZOFRAN) 4 MG tablet Take 1 tablet (4 mg total) by mouth every 8 (eight) hours as needed. 20 tablet 0  . OXYGEN-HELIUM IN Inhale 2 % into the lungs at bedtime.      . rosuvastatin (CRESTOR) 5 MG tablet TAKE 1 TABLET (5 MG TOTAL) BY MOUTH DAILY. 90 tablet 1  . sertraline (ZOLOFT) 100 MG tablet TAKE 1  AND 1/2 TABLET BY MOUTH TWICE A DAY 270 tablet 1  . SPIRIVA HANDIHALER 18 MCG inhalation capsule INHALE 1 CAPSULE VIA HANDIHALER ONCE DAILY AT THE SAME TIME EVERY DAY 90 capsule 1   No current facility-administered medications for this visit.     Allergies  Allergen Reactions  . Amoxicillin-Pot Clavulanate     REACTION: stomach sensitive.  Lenice Llamas [Roflumilast]     Severe nausea   . Erythromycin Base     REACTION: GI upset  . Neurontin [Gabapentin]     anxiety    Family History  Problem Relation Age of Onset  . Diabetes Daughter   . Kidney disease Daughter   . Lung cancer Mother        smoked  . COPD Mother        smoked  . COPD Father        smoked  . Prostate  cancer Father   . Kidney failure Father   . Breast cancer Maternal Aunt   . Diabetes Maternal Grandmother   . Breast cancer Maternal Grandmother   . Cancer Cousin        colon    Social History   Social History  . Marital status: Married    Spouse name: N/A  . Number of children: N/A  . Years of education: N/A   Occupational History  . Not on file.   Social History Main Topics  . Smoking status: Former Smoker    Packs/day: 0.25    Years: 40.00    Types: Cigarettes    Quit date: 09/01/2016  . Smokeless tobacco: Never Used     Comment: Uses nicotrol inhaler on occasion  . Alcohol use No  . Drug use: No  . Sexual activity: Not Currently   Other Topics Concern  . Not on file   Social History Narrative   Lives with husband in [place]. 2 dogs. Works at [employer].     Constitutional: Denies fever, malaise, fatigue, headache or abrupt weight changes. Skin: Pt reports rash under breast. Denies ulcerations.  Respiratory: Pt reports shortness of breath. Denies difficulty breathing, cough or sputum production.   Cardiovascular: Denies chest pain, chest tightness, palpitations or swelling in the hands or feet.  GI: Pt reports nausea. Denies abdominal pain, reflux, constipation, diarrhea or blood in her stool.  Psych: Pt reports anxiety. Denies depression, SI/HI.  No other specific complaints in a complete review of systems (except as listed in HPI above).     Objective:   Physical Exam   BP 134/68 (BP Location: Left Arm, Patient Position: Sitting, Cuff Size: Normal)   Pulse 70   Temp 98.4 F (36.9 C) (Oral)   Ht 5\' 4"  (1.626 m)   Wt 156 lb 12 oz (71.1 kg)   SpO2 93%   BMI 26.91 kg/m  Wt Readings from Last 3 Encounters:  08/21/17 156 lb 12 oz (71.1 kg)  07/30/17 156 lb (70.8 kg)  06/30/17 157 lb (71.2 kg)    General: Appears her stated age in NAD. Skin: Yeast noted under left breast. Cardiovascular: Normal rate and rhythm.  Pulmonary/Chest: Normal effort and  positive vesicular breath sounds. No respiratory distress. No wheezes, rales or ronchi noted.  Abdomen: Soft, nontender. Normal bowel sounds. No distention or masses noted. Neurological: Alert and oriented.   Psychiatric: Mood and affect normal. Behavior is normal. Judgment and thought content normal.     BMET    Component Value Date/Time   NA 138 10/31/2016 1622  NA 140 09/28/2014 0637   K 4.2 10/31/2016 1622   K 4.3 09/28/2014 0637   CL 99 10/31/2016 1622   CL 108 (H) 09/28/2014 0637   CO2 23 10/31/2016 1622   CO2 26 09/28/2014 0637   GLUCOSE 111 (H) 10/31/2016 1622   GLUCOSE 117 (H) 09/28/2014 0637   BUN 14 10/31/2016 1622   BUN 13 09/28/2014 0637   CREATININE 0.82 10/31/2016 1622   CALCIUM 10.2 10/31/2016 1622   CALCIUM 7.7 (L) 09/28/2014 0637   GFRNONAA >60 09/28/2014 0637   GFRAA >60 09/28/2014 0637    Lipid Panel     Component Value Date/Time   CHOL 156 01/20/2017 1407   CHOL 110 09/28/2014 0637   TRIG 59.0 01/20/2017 1407   TRIG 102 09/28/2014 0637   HDL 82.90 01/20/2017 1407   HDL 54 09/28/2014 0637   CHOLHDL 2 01/20/2017 1407   VLDL 11.8 01/20/2017 1407   VLDL 20 09/28/2014 0637   LDLCALC 61 01/20/2017 1407   LDLCALC 36 09/28/2014 0637    CBC    Component Value Date/Time   WBC 6.3 10/31/2016 1622   RBC 4.66 10/31/2016 1622   HGB 13.6 10/31/2016 1622   HGB 13.0 09/30/2014 0525   HCT 41.5 10/31/2016 1622   HCT 39.0 09/30/2014 0525   PLT 292 10/31/2016 1622   PLT 234 09/30/2014 0525   MCV 89.1 10/31/2016 1622   MCV 93 09/30/2014 0525   MCH 29.2 10/31/2016 1622   MCHC 32.8 10/31/2016 1622   RDW 14.1 10/31/2016 1622   RDW 14.1 09/30/2014 0525   LYMPHSABS 1,260 10/31/2016 1622   LYMPHSABS 1.3 09/30/2014 0525   MONOABS 693 10/31/2016 1622   MONOABS 0.5 09/30/2014 0525   EOSABS 189 10/31/2016 1622   EOSABS 0.2 09/30/2014 0525   BASOSABS 0 10/31/2016 1622   BASOSABS 0.0 09/30/2014 0525    Hgb A1C Lab Results  Component Value Date   HGBA1C  6.4 11/04/2016           Assessment & Plan:   Anxiety:  She feels like she is suffocating Sats are normal She has be cleared by cardiologist and pulmonologist Increase Zoloft to 200 mg BID Make sure you are taking Xanax TID as prescribed  Chronic Nausea:  ? Silent reflux Start Zantac 75 mg QHS Zofran refilled today  Candida Infection of Skin:  eRx for Diflucan 100 mg daily x 10 days  Return precautions discussed Nicki ReaperBAITY, REGINA, NP

## 2017-08-21 NOTE — Addendum Note (Signed)
Addended by: Barrington EllisonWAGONER, Lacosta Hargan C on: 08/21/2017 05:12 PM   Modules accepted: Orders

## 2017-08-22 ENCOUNTER — Other Ambulatory Visit: Payer: Self-pay | Admitting: Internal Medicine

## 2017-08-24 ENCOUNTER — Encounter: Payer: Self-pay | Admitting: Internal Medicine

## 2017-08-24 NOTE — Telephone Encounter (Signed)
Last filled 06/30/2017 by Dr Fredric MareAridar #[id number] with 3 refills... Not appropriate for refill

## 2017-08-24 NOTE — Telephone Encounter (Signed)
Mel- Can you call pt and clarify. Cardiology note from 07/2017 indicates that pt is taking Losartan 50  Mg daily. It seems when the RX request came from the pharmacy, the pharmacy has the dose at 75 mg. Call and ask patient if she is taking 1 or 1.5 tab.

## 2017-08-24 NOTE — Telephone Encounter (Signed)
This patient is requesting Cozaar to be refilled. Need clarification on the dosage, renewal prescription reads for the patient to take 75mg  daily but on patient list the  Prescription is for 50mg  daily.

## 2017-08-25 MED ORDER — FLUCONAZOLE 100 MG PO TABS: 100.0000 mg | ORAL_TABLET | Freq: Every day | ORAL | 0 refills | Status: DC

## 2017-08-27 ENCOUNTER — Other Ambulatory Visit: Payer: Self-pay | Admitting: Internal Medicine

## 2017-08-28 MED ORDER — LOSARTAN POTASSIUM 50 MG PO TABS: 50.0000 mg | ORAL_TABLET | Freq: Every day | ORAL | 1 refills | Status: DC

## 2017-08-28 MED ORDER — ROSUVASTATIN CALCIUM 5 MG PO TABS
5.0000 mg | ORAL_TABLET | Freq: Every day | ORAL | 1 refills | Status: DC
Start: 2017-08-28 — End: 2018-08-15

## 2017-08-28 NOTE — Addendum Note (Signed)
Addended by: Lorre MunroeBAITY, REGINA W on: 08/28/2017 02:46 PM   Modules accepted: Orders

## 2017-09-08 ENCOUNTER — Other Ambulatory Visit: Payer: Self-pay | Admitting: Internal Medicine

## 2017-09-08 NOTE — Telephone Encounter (Signed)
Last filled 07/20/17... Please advise 

## 2017-09-08 NOTE — Telephone Encounter (Signed)
Ok to phone in Xanax 

## 2017-09-08 NOTE — Telephone Encounter (Signed)
Rx called in to pharmacy. 

## 2017-09-18 ENCOUNTER — Ambulatory Visit: Payer: Medicare Other | Admitting: Internal Medicine

## 2017-09-18 ENCOUNTER — Ambulatory Visit: Payer: Self-pay | Admitting: Family Medicine

## 2017-09-18 ENCOUNTER — Ambulatory Visit: Payer: Self-pay | Admitting: Pulmonary Disease

## 2017-09-18 VITALS — BP 124/80 | HR 72 | Temp 98.4°F | Wt 156.0 lb

## 2017-09-18 DIAGNOSIS — J449 Chronic obstructive pulmonary disease, unspecified: Secondary | ICD-10-CM | POA: Diagnosis not present

## 2017-09-18 DIAGNOSIS — R110 Nausea: Secondary | ICD-10-CM | POA: Diagnosis not present

## 2017-09-18 DIAGNOSIS — E119 Type 2 diabetes mellitus without complications: Secondary | ICD-10-CM | POA: Diagnosis not present

## 2017-09-18 DIAGNOSIS — R062 Wheezing: Secondary | ICD-10-CM

## 2017-09-18 MED ORDER — ALBUTEROL SULFATE (2.5 MG/3ML) 0.083% IN NEBU
2.5000 mg | INHALATION_SOLUTION | Freq: Once | RESPIRATORY_TRACT | Status: AC
  Administered 2017-09-18: 2.5 mg via RESPIRATORY_TRACT

## 2017-09-18 MED ORDER — OMEPRAZOLE 20 MG PO CPDR: 20.0000 mg | DELAYED_RELEASE_CAPSULE | Freq: Every day | ORAL | 2 refills | Status: DC

## 2017-09-18 NOTE — Progress Notes (Signed)
Subjective:    Patient ID: Daivd CouncilMary H Jaquez, female    DOB: 02/16/1946, 71 y.o.   MRN: [id number]  HPI  Pt presents to the clinic today with c/o chronic nausea. She never vomits but reports it's a constant feeling of having an unsettled stomach. She denies heartburn or reflux. She reports she is constipated, last BM yesterday. She takes Zofran as needed, which she reports provides good relief, she just feels like she is taking too much of it. She does have a history of DM 2 and is taking Metformin but reports she was on Metformin for many years prior to the start of her nausea. She had an upper GI in 2012 which showed mild gastritis and a duodenal polyp. She denies recent changes in diet or medication. She has had a negative H Pylori in the past.   Review of Systems  Past Medical History:  Diagnosis Date  . Anxiety   . Asthmatic bronchitis   . COPD (chronic obstructive pulmonary disease) (HCC)   . Depression   . Diabetes mellitus   . Diverticulitis   . Diverticulosis   . Hyperlipidemia   . Hypertension   . Osteopenia   . Sleep apnea   . Stress-induced cardiomyopathy    a. echo 09/28/2014: EF 45-50%, severe HK of mid-distal anterior, apical and mid-distal inferior walls suggestive of stress induced CM, mildly dilated LA, mildly dilated PASP, mild TR, trivial pericardial effusion    Current Outpatient Medications  Medication Sig Dispense Refill  . albuterol (PROVENTIL) (2.5 MG/3ML) 0.083% nebulizer solution Take 3 mLs (2.5 mg total) by nebulization every 6 (six) hours as needed for wheezing or shortness of breath. 75 mL 12  . albuterol (VENTOLIN HFA) 108 (90 Base) MCG/ACT inhaler INHALE TWO PUFFS INTO THE LUNGS EVERY FOUR HOURS AS NEEDED FOR WHEEZING 1 Inhaler 4  . ALPRAZolam (XANAX) 0.5 MG tablet TAKE 1 TABLET BY MOUTH EVERY 8 HOURS AS NEEDED 90 tablet 0  . Cholecalciferol (VITAMIN D-3) 1000 UNITS CAPS Take 2 capsules by mouth.     . clotrimazole-betamethasone (LOTRISONE) cream APPLY  TO AFFECTED AREA TWICE A DAY 45 g 0  . fluconazole (DIFLUCAN) 100 MG tablet Take 1 tablet (100 mg total) by mouth daily. 10 tablet 0  . Fluticasone-Salmeterol (ADVAIR DISKUS) 250-50 MCG/DOSE AEPB Inhale 1 puff into the lungs 2 (two) times daily. 60 each 10  . glucose blood (ONE TOUCH ULTRA TEST) test strip 1 each by Other route 2 (two) times daily. Use as instructed 100 each 5  . losartan (COZAAR) 50 MG tablet Take 1 tablet (50 mg total) by mouth daily. 90 tablet 1  . metFORMIN (GLUCOPHAGE) 500 MG tablet TAKE 1 TABLET BY MOUTH TWICE A DAY WITH A MEAL 180 tablet 1  . metoprolol succinate (TOPROL-XL) 50 MG 24 hr tablet Take 1 tablet (50 mg total) by mouth daily. Take with or immediately following a meal. 90 tablet 3  . ondansetron (ZOFRAN) 4 MG tablet Take 1 tablet (4 mg total) by mouth every 8 (eight) hours as needed. 90 tablet 1  . OXYGEN-HELIUM IN Inhale 2 % into the lungs at bedtime.      . rosuvastatin (CRESTOR) 5 MG tablet Take 1 tablet (5 mg total) by mouth daily. 90 tablet 1  . sertraline (ZOLOFT) 100 MG tablet TAKE 2 TABLET BY MOUTH TWICE A DAY 360 tablet 1  . SPIRIVA HANDIHALER 18 MCG inhalation capsule INHALE 1 CAPSULE VIA HANDIHALER ONCE DAILY AT THE SAME TIME EVERY DAY  90 capsule 1   No current facility-administered medications for this visit.     Allergies  Allergen Reactions  . Amoxicillin-Pot Clavulanate     REACTION: stomach sensitive.  Lenice Llamas. Daliresp [Roflumilast]     Severe nausea   . Erythromycin Base     REACTION: GI upset  . Neurontin [Gabapentin]     anxiety    Family History  Problem Relation Age of Onset  . Diabetes Daughter   . Kidney disease Daughter   . Lung cancer Mother        smoked  . COPD Mother        smoked  . COPD Father        smoked  . Prostate cancer Father   . Kidney failure Father   . Breast cancer Maternal Aunt   . Diabetes Maternal Grandmother   . Breast cancer Maternal Grandmother   . Cancer Cousin        colon    Social History    Socioeconomic History  . Marital status: Married    Spouse name: Not on file  . Number of children: Not on file  . Years of education: Not on file  . Highest education level: Not on file  Social Needs  . Financial resource strain: Not on file  . Food insecurity - worry: Not on file  . Food insecurity - inability: Not on file  . Transportation needs - medical: Not on file  . Transportation needs - non-medical: Not on file  Occupational History  . Not on file  Tobacco Use  . Smoking status: Former Smoker    Packs/day: 0.25    Years: 40.00    Pack years: 10.00    Types: Cigarettes    Last attempt to quit: 09/01/2016    Years since quitting: 1.0  . Smokeless tobacco: Never Used  . Tobacco comment: Uses nicotrol inhaler on occasion  Substance and Sexual Activity  . Alcohol use: No  . Drug use: No  . Sexual activity: Not Currently  Other Topics Concern  . Not on file  Social History Narrative   [place]ives with [place] in [place]. 2 dogs. Works at [employer].     Constitutional: Denies fever, malaise, fatigue, headache or abrupt weight changes.  Respiratory: Denies difficulty breathing, shortness of breath, cough or sputum production.   Cardiovascular: Denies chest pain, chest tightness, palpitations or swelling in the hands or feet.  Gastrointestinal: Pt reports nausea, constipation.. Denies abdominal pain, bloating, diarrhea or blood in the stool.  GU: Denies urgency, frequency, pain with urination, burning sensation, blood in urine, odor or discharge.   No other specific complaints in a complete review of systems (except as listed in HPI above).     Objective:   Physical Exam   BP 124/80   Pulse 72   Temp 98.4 F (36.9 C) (Oral)   Wt 156 lb (70.8 kg)   SpO2 94%   BMI 26.78 kg/m   Wt Readings from Last 3 Encounters:  08/21/17 156 lb 12 oz (71.1 kg)  07/30/17 156 lb (70.8 kg)  06/30/17 157 lb (71.2 kg)    General: Appears her stated age, in NAD. Respiratory: She is  audibly wheezing on exam. Mildly increased effort. Bilateral inspiratory and expiratory wheezing noted. Abdomen: Soft and mildly tender in the epigastric region. Hypoactive bowel sounds. No distention or masses noted.    BMET    Component Value Date/Time   NA 138 10/31/2016 1622   NA 140 09/28/2014 [id number]  K 4.2 10/31/2016 1622   K 4.3 09/28/2014 0637   CL 99 10/31/2016 1622   CL 108 (H) 09/28/2014 0637   CO2 23 10/31/2016 1622   CO2 26 09/28/2014 0637   GLUCOSE 111 (H) 10/31/2016 1622   GLUCOSE 117 (H) 09/28/2014 0637   BUN 14 10/31/2016 1622   BUN 13 09/28/2014 0637   CREATININE 0.82 10/31/2016 1622   CALCIUM 10.2 10/31/2016 1622   CALCIUM 7.7 (L) 09/28/2014 0637   GFRNONAA >60 09/28/2014 0637   GFRAA >60 09/28/2014 0637    Lipid Panel     Component Value Date/Time   CHOL 156 01/20/2017 1407   CHOL 110 09/28/2014 0637   TRIG 59.0 01/20/2017 1407   TRIG 102 09/28/2014 0637   HDL 82.90 01/20/2017 1407   HDL 54 09/28/2014 0637   CHOLHDL 2 01/20/2017 1407   VLDL 11.8 01/20/2017 1407   VLDL 20 09/28/2014 0637   LDLCALC 61 01/20/2017 1407   LDLCALC 36 09/28/2014 0637    CBC    Component Value Date/Time   WBC 6.3 10/31/2016 1622   RBC 4.66 10/31/2016 1622   HGB 13.6 10/31/2016 1622   HGB 13.0 09/30/2014 0525   HCT 41.5 10/31/2016 1622   HCT 39.0 09/30/2014 0525   PLT 292 10/31/2016 1622   PLT 234 09/30/2014 0525   MCV 89.1 10/31/2016 1622   MCV 93 09/30/2014 0525   MCH 29.2 10/31/2016 1622   MCHC 32.8 10/31/2016 1622   RDW 14.1 10/31/2016 1622   RDW 14.1 09/30/2014 0525   LYMPHSABS 1,260 10/31/2016 1622   LYMPHSABS 1.3 09/30/2014 0525   MONOABS 693 10/31/2016 1622   MONOABS 0.5 09/30/2014 0525   EOSABS 189 10/31/2016 1622   EOSABS 0.2 09/30/2014 0525   BASOSABS 0 10/31/2016 1622   BASOSABS 0.0 09/30/2014 0525    Hgb A1C Lab Results  Component Value Date   HGBA1C 6.4 11/04/2016           Assessment & Plan:   Chronic Nausea:  Will start  Prilosec OTC to see if this helps with nausea Referral placed to GI for possible barium swallow to r/o diabetic gastroparesis Advised her to take the Zofran prn but not to take too much as this could be contributing to her constipation. Advised her to consume a high fiber diet and drink plenty of water.  Wheezing,COPD:  Albuterol neb given in office. Known COPD She will continue Spiriva and Advair, Albuterol prn and let me know if symptoms persist or worsen  Return precautions discussed [name], NP

## 2017-09-20 ENCOUNTER — Encounter: Payer: Self-pay | Admitting: Internal Medicine

## 2017-09-20 NOTE — Patient Instructions (Signed)
Bronchospasm, Adult Bronchospasm is a tightening of the airways going into the lungs. During an episode, it may be harder to breathe. You may cough, and you may make a whistling sound when you breathe (wheeze). This condition often affects people with asthma. What are the causes? This condition is caused by swelling and irritation in the airways. It can be triggered by:  An infection (common).  Seasonal allergies.  An allergic reaction.  Exercise.  Irritants. These include pollution, cigarette smoke, strong odors, aerosol sprays, and paint fumes.  Weather changes. Winds increase molds and pollens in the air. Cold air may cause swelling.  Stress and emotional upset.  What are the signs or symptoms? Symptoms of this condition include:  Wheezing. If the episode was triggered by an allergy, wheezing may start right away or hours later.  Nighttime coughing.  Frequent or severe coughing with a simple cold.  Chest tightness.  Shortness of breath.  Decreased ability to exercise.  How is this diagnosed? This condition is usually diagnosed with a review of your medical history and a physical exam. Tests, such as lung function tests, are sometimes done to look for other conditions. The need for a chest X-ray depends on where the wheezing occurs and whether it is the first time you have wheezed. How is this treated? This condition may be treated with:  Inhaled medicines. These open up the airways and help you breathe. They can be taken with an inhaler or a nebulizer device.  Corticosteroid medicines. These may be given for severe bronchospasm, usually when it is associated with asthma.  Avoiding triggers, such as irritants, infection, or allergies.  Follow these instructions at home: Medicines  Take over-the-counter and prescription medicines only as told by your health care provider.  If you need to use an inhaler or nebulizer to take your medicine, ask your health care  provider to explain how to use it correctly. If you were given a spacer, always use it with your inhaler. Lifestyle  Reduce the number of triggers in your home. To do this: ? Change your heating and air conditioning filter at least once a month. ? Limit your use of fireplaces and wood stoves. ? Do not smoke. Do not allow smoking in your home. ? Avoid using perfumes and fragrances. ? Get rid of pests, such as roaches and mice, and their droppings. ? Remove any mold from your home. ? Keep your house clean and dust free. Use unscented cleaning products. ? Replace carpet with wood, tile, or vinyl flooring. Carpet can trap dander and dust. ? Use allergy-proof pillows, mattress covers, and box spring covers. ? Wash bed sheets and blankets every week in hot water. Dry them in a dryer. ? Use blankets that are made of polyester or cotton. ? Wash your hands often. ? Do not allow pets in your bedroom.  Avoid breathing in cold air when you exercise. General instructions  Have a plan for seeking medical care. Know when to call your health care provider and local emergency services, and where to get emergency care.  Stay up to date on your immunizations.  When you have an episode of bronchospasm, stay calm. Try to relax and breathe more slowly.  If you have asthma, make sure you have an asthma action plan.  Keep all follow-up visits as told by your health care provider. This is important. Contact a health care provider if:  You have muscle aches.  You have chest pain.  The mucus that you   cough up (sputum) changes from clear or white to yellow, green, gray, or bloody.  You have a fever.  Your sputum gets thicker. Get help right away if:  Your wheezing and coughing get worse, even after you take your prescribed medicines.  It gets even harder to breathe.  You develop severe chest pain. Summary  Bronchospasm is a tightening of the airways going into the lungs.  During an episode of  bronchospasm, you may have a harder time breathing. You may cough and make a whistling sound when you breathe (wheeze).  Avoid exposure to triggers such as smoke, dust, mold, animal dander, and fragrances.  When you have an episode of bronchospasm, stay calm. Try to relax and breathe more slowly. This information is not intended to replace advice given to you by your health care provider. Make sure you discuss any questions you have with your health care provider. Document Released: 10/23/2003 Document Revised: 10/16/2016 Document Reviewed: 10/16/2016 Elsevier Interactive Patient Education  2017 Elsevier Inc.  

## 2017-10-01 LAB — HM DIABETES EYE EXAM

## 2017-10-02 ENCOUNTER — Encounter: Payer: Self-pay | Admitting: *Deleted

## 2017-10-05 ENCOUNTER — Encounter: Payer: Self-pay | Admitting: Internal Medicine

## 2017-10-07 ENCOUNTER — Other Ambulatory Visit: Payer: Self-pay

## 2017-10-07 ENCOUNTER — Ambulatory Visit: Payer: Medicare Other | Admitting: Gastroenterology

## 2017-10-07 ENCOUNTER — Encounter: Payer: Self-pay | Admitting: Gastroenterology

## 2017-10-07 VITALS — BP 111/71 | HR 66 | Temp 98.0°F | Ht 64.0 in | Wt 154.8 lb

## 2017-10-07 DIAGNOSIS — Z86010 Personal history of colonic polyps: Secondary | ICD-10-CM

## 2017-10-07 DIAGNOSIS — R112 Nausea with vomiting, unspecified: Secondary | ICD-10-CM | POA: Diagnosis not present

## 2017-10-07 DIAGNOSIS — R1310 Dysphagia, unspecified: Secondary | ICD-10-CM | POA: Diagnosis not present

## 2017-10-07 DIAGNOSIS — Z01812 Encounter for preprocedural laboratory examination: Secondary | ICD-10-CM

## 2017-10-07 NOTE — Progress Notes (Signed)
Melodie BouillonVarnita , MD [address], [address], [place], [place], [address] [address], [address], [place], [place], [address] Phone: [phone]  Fax: [phone]  Consultation  Referring Provider:     Lorre MunroeBaity, Regina W, NP Primary Care Physician:  Lorre MunroeBaity, Regina W, NP Primary Gastroenterologist:  Pasty SpillersVarnita B , MD        Reason for Consultation:     Nausea Date of Consultation:  10/07/2017         HPI:   Daivd CouncilMary H Skinner is a 71 y.o. female referred for chronic nausea.  Symptoms started about a year ago.  Patient has history of diabetes and last A1c was 6.2.  Last glucose was 111.  Patient does not have any emesis.  Denies dysphagia, but reports odynophagia especially with liquids.  No choking or coughing episodes.  No episodes of food impaction.  No weight loss.  No abdominal pain.  No dizziness.  No family history of colon cancer.  Last colonoscopy was in 2010 at which time 2, 10 mm polyps from transverse colon were removed and were hyperplastic.  Per patient repeat was recommended in 5 years.  Patient had an EGD in 2012 for heartburn and dyspepsia, gastric erythema and a duodenal polyp was seen.  Polyp was reported to be removed with biopsy forceps.  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 pylori and showed mild reactive gastropathy.  H. pylori serology was done in November 2012 and was negative.  Patient was started on Prilosec by her primary care doctor due to nausea, patient is taking this 30 minutes before breakfast every day and this has not helped.  She was also recently seen by cardiology on September 2018, for atypical chest pain " discussed with the patient that her chest pain seems atypical for a cardiac etiology but her dyspnea on exertion would be the most concerning aspect of her symptomology. Last ischemic evaluation was a cardiac catheterization which showed minimal luminal irregularities which I reviewed with her in detail. Will  pursue a Lexiscan Myoview to rule out an ischemic etiology of her symptoms."  The studies showed low risk study without ischemia and cardiology result note said.Marland Kitchen. "Please let the patient know her stress test showed no evidence of ischemia concerning for a potential blockage. Pumping function of the heart was normal. Thank you!"  Besides this patient has stress-induced cardiomyopathy with EF of 40-45% in 2015 with repeat echo showing a preserved EF of 60-65%.  Past Medical History:  Diagnosis Date  . Anxiety   . Asthmatic bronchitis   . COPD (chronic obstructive pulmonary disease) (HCC)   . Depression   . Diabetes mellitus   . Diverticulitis   . Diverticulosis   . Hyperlipidemia   . Hypertension   . Osteopenia   . Sleep apnea   . Stress-induced cardiomyopathy    a. echo 09/28/2014: EF 45-50%, severe HK of mid-distal anterior, apical and mid-distal inferior walls suggestive of stress induced CM, mildly dilated LA, mildly dilated PASP, mild TR, trivial pericardial effusion    Past Surgical History:  Procedure Laterality Date  . ABDOMINAL HYSTERECTOMY     partial  . BREAST BIOPSY Right    neg  . CARDIAC CATHETERIZATION  09/29/2014   [facility]  . NASAL SINUS SURGERY      Prior to Admission medications   Medication Sig Start Date End Date Taking? Authorizing Provider  albuterol (PROVENTIL) (2.5 MG/3ML) 0.083% nebulizer solution Take 3 mLs (2.5 mg total)  by nebulization every 6 (six) hours as needed for wheezing or shortness of breath. 12/12/16  Yes Merwyn Katos, MD  albuterol (VENTOLIN HFA) 108 (90 Base) MCG/ACT inhaler INHALE TWO PUFFS INTO THE LUNGS EVERY FOUR HOURS AS NEEDED FOR WHEEZING 06/29/17  Yes Baity, Salvadore Oxford, NP  ALPRAZolam Prudy Feeler) 0.5 MG tablet TAKE 1 TABLET BY MOUTH EVERY 8 HOURS AS NEEDED 09/08/17  Yes Lorre Munroe, NP  Cholecalciferol (VITAMIN D-3) 1000 UNITS CAPS Take 2 capsules by mouth.    Yes [provider]  clotrimazole-betamethasone (LOTRISONE) cream APPLY  TO AFFECTED AREA TWICE A DAY 05/13/17  Yes Baity, Salvadore Oxford, NP  Fluticasone-Salmeterol (ADVAIR DISKUS) 250-50 MCG/DOSE AEPB Inhale 1 puff into the lungs 2 (two) times daily. 12/12/16  Yes Merwyn Katos, MD  glucose blood (ONE TOUCH ULTRA TEST) test strip 1 each by Other route 2 (two) times daily. Use as instructed 04/07/17  Yes Baity, Salvadore Oxford, NP  losartan (COZAAR) 50 MG tablet Take 1 tablet (50 mg total) by mouth daily. 08/28/17  Yes Lorre Munroe, NP  metFORMIN (GLUCOPHAGE) 500 MG tablet TAKE 1 TABLET BY MOUTH TWICE A DAY WITH A MEAL 07/14/17  Yes Lorre Munroe, NP  omeprazole (PRILOSEC) 20 MG capsule Take 1 capsule (20 mg total) daily by mouth. 09/18/17  Yes Baity, Salvadore Oxford, NP  ondansetron (ZOFRAN) 4 MG tablet Take 1 tablet (4 mg total) by mouth every 8 (eight) hours as needed. 08/21/17  Yes Baity, Salvadore Oxford, NP  OXYGEN-HELIUM IN Inhale 2 % into the lungs at bedtime.     Yes [provider]  rosuvastatin (CRESTOR) 5 MG tablet Take 1 tablet (5 mg total) by mouth daily. 08/28/17  Yes Lorre Munroe, NP  sertraline (ZOLOFT) 100 MG tablet TAKE 2 TABLET BY MOUTH TWICE A DAY 08/21/17  Yes Baity, Salvadore Oxford, NP  SPIRIVA HANDIHALER 18 MCG inhalation capsule INHALE 1 CAPSULE VIA HANDIHALER ONCE DAILY AT THE SAME TIME EVERY DAY 07/04/17  Yes Lorre Munroe, NP  metoprolol succinate (TOPROL-XL) 50 MG 24 hr tablet Take 1 tablet (50 mg total) by mouth daily. Take with or immediately following a meal. 06/30/17 09/28/17  Iran Ouch, MD    Family History  Problem Relation Age of Onset  . Diabetes Daughter   . Kidney disease Daughter   . Lung cancer Mother        smoked  . COPD Mother        smoked  . COPD Father        smoked  . Prostate cancer Father   . Kidney failure Father   . Breast cancer Maternal Aunt   . Diabetes Maternal Grandmother   . Breast cancer Maternal Grandmother   . Cancer Cousin        colon     Social History   Tobacco Use  . Smoking status: Former Smoker     Packs/day: 0.25    Years: 40.00    Pack years: 10.00    Types: Cigarettes    Last attempt to quit: 09/01/2016    Years since quitting: 1.0  . Smokeless tobacco: Never Used  . Tobacco comment: Uses nicotrol inhaler on occasion  Substance Use Topics  . Alcohol use: No  . Drug use: No    Allergies as of 10/07/2017 - Review Complete 10/07/2017  Allergen Reaction Noted  . Amoxicillin-pot clavulanate  11/07/2009  . Daliresp [roflumilast]  11/24/2011  . Erythromycin base  11/07/2009  . Neurontin [  gabapentin]  12/07/2014    Review of Systems:    All systems reviewed and negative except where noted in HPI.   Physical Exam:  Vital signs in last 24 hours: Vitals:   10/07/17 1101  BP: 111/71  Pulse: 66  Temp: 98 F (36.7 C)  TempSrc: Oral  Weight: 154 lb 12.8 oz (70.2 kg)  Height: 5\' 4"  (1.626 m)     General:   Pleasant, cooperative in NAD Head:  Normocephalic and atraumatic. Eyes:   No icterus.   Conjunctiva pink. PERRLA. Ears:  Normal auditory acuity. Neck:  Supple; no masses or thyroidomegaly Lungs: Respirations even and unlabored. Lungs clear to auscultation bilaterally.   No wheezes, crackles, or rhonchi.  Heart:  Regular rate and rhythm;  Without murmur, clicks, rubs or gallops Abdomen:  Soft, nondistended, nontender. Normal bowel sounds. No appreciable masses or hepatomegaly.  No rebound or guarding.  Neurologic:  Alert and oriented x3;  grossly normal neurologically. Skin:  Intact without significant lesions or rashes. Cervical Nodes:  No significant cervical adenopathy. Psych:  Alert and cooperative. Normal affect.  LAB RESULTS: No results for input(s): WBC, HGB, HCT, PLT in the last 72 hours. BMET No results for input(s): NA, K, CL, CO2, GLUCOSE, BUN, CREATININE, CALCIUM in the last 72 hours. LFT No results for input(s): PROT, ALBUMIN, AST, ALT, ALKPHOS, BILITOT, BILIDIR, IBILI in the last 72 hours. PT/INR No results for input(s): LABPROT, INR in the last 72  hours.  STUDIES: No results found.    Impression / Plan:   Daivd CouncilMary H Meanor is a 71 y.o. y/o female with chronic nausea, with odynophagia to liquids, no weight loss referred for her above symptoms  Patient is due for polyp surveillance colonoscopy.  As per patient she was recommended to have one in 5 years from 2010.  (This is also documented under [facility] colonoscopy under February 23, 2009 under procedures).  We will schedule for surveillance colonoscopy  Also scheduled for EGD due to the odynophagia, dyspepsia and nausea  Will allow us to evaluate for any esophagitis, Candida that might be leading to odynophagia, obtain biopsies for H. Pylori.   If these are negative her symptoms might be of a functional nature and would recommend PCP to minimize unnecessary meds. Could also be related to her anxiety but increasing zoloft hasnt helped as per patient.   I have discussed alternative options, risks & benefits,  which include, but are not limited to, bleeding, infection, perforation,respiratory complication & drug reaction.  The patient agrees with this plan & written consent will be obtained.      Thank you for involving me in the care of this patient.     Pasty SpillersVarnita B , MD  10/07/2017, 4:08 PM

## 2017-10-13 ENCOUNTER — Encounter: Payer: Self-pay | Admitting: Gastroenterology

## 2017-10-13 ENCOUNTER — Telehealth: Payer: Self-pay | Admitting: Gastroenterology

## 2017-10-13 ENCOUNTER — Telehealth: Payer: Self-pay

## 2017-10-13 NOTE — Telephone Encounter (Signed)
Patient needs to r/s procedure.

## 2017-10-13 NOTE — Telephone Encounter (Signed)
Patient left a voice message to cancel procudure. Please call her back to reschedule.

## 2017-10-13 NOTE — Telephone Encounter (Signed)
Pt has been rescheduled to January 10th with Dr. Maximino Greenlandahiliani.  Thanks [employer]

## 2017-10-14 NOTE — Telephone Encounter (Signed)
Pt rescheduled procedures to Jan 10th.

## 2017-10-15 ENCOUNTER — Other Ambulatory Visit: Payer: Self-pay | Admitting: Internal Medicine

## 2017-10-16 NOTE — Telephone Encounter (Signed)
Ok to phone in Xanax 

## 2017-10-16 NOTE — Telephone Encounter (Signed)
Last filled 09/08/17... Please advise 

## 2017-10-16 NOTE — Telephone Encounter (Signed)
Rx called in to pharmacy. 

## 2017-11-04 ENCOUNTER — Ambulatory Visit: Payer: Self-pay | Admitting: Pulmonary Disease

## 2017-11-09 ENCOUNTER — Other Ambulatory Visit
Admission: RE | Admit: 2017-11-09 | Discharge: 2017-11-09 | Disposition: A | Discharge status: Home / Self Care | Payer: Medicare Other | Source: Ambulatory Visit | Attending: Gastroenterology | Admitting: Gastroenterology

## 2017-11-09 DIAGNOSIS — Z01812 Encounter for preprocedural laboratory examination: Secondary | ICD-10-CM | POA: Diagnosis present

## 2017-11-09 LAB — CBC WITH DIFFERENTIAL/PLATELET
BASOS PCT: 1 %
Basophils Absolute: 0 10*3/uL (ref 0–0.1)
EOS ABS: 0.1 10*3/uL (ref 0–0.7)
EOS PCT: 1 %
HCT: 41.5 % (ref 35.0–47.0)
HEMOGLOBIN: 13.6 g/dL (ref 12.0–16.0)
LYMPHS ABS: 1.3 10*3/uL (ref 1.0–3.6)
Lymphocytes Relative: 22 %
MCH: 30.1 pg (ref 26.0–34.0)
MCHC: 32.8 g/dL (ref 32.0–36.0)
MCV: 91.8 fL (ref 80.0–100.0)
Monocytes Absolute: 0.5 10*3/uL (ref 0.2–0.9)
Monocytes Relative: 9 %
NEUTROS PCT: 67 %
Neutro Abs: 4 10*3/uL (ref 1.4–6.5)
Platelets: 232 10*3/uL (ref 150–440)
RBC: 4.52 MIL/uL (ref 3.80–5.20)
RDW: 14.8 % — ABNORMAL HIGH (ref 11.5–14.5)
WBC: 5.9 10*3/uL (ref 3.6–11.0)

## 2017-11-09 LAB — COMPREHENSIVE METABOLIC PANEL
ALK PHOS: 44 U/L (ref 38–126)
ALT: 25 U/L (ref 14–54)
AST: 26 U/L (ref 15–41)
Albumin: 4.4 g/dL (ref 3.5–5.0)
Anion gap: 8 (ref 5–15)
BUN: 20 mg/dL (ref 6–20)
CALCIUM: 9.7 mg/dL (ref 8.9–10.3)
CHLORIDE: 98 mmol/L — AB (ref 101–111)
CO2: 29 mmol/L (ref 22–32)
CREATININE: 0.94 mg/dL (ref 0.44–1.00)
GFR calc Af Amer: >60 mL/min (ref >60)
GFR calc non Af Amer: 59 mL/min — ABNORMAL LOW (ref >60)
Glucose, Bld: 117 mg/dL — ABNORMAL HIGH (ref 65–99)
Potassium: 4.5 mmol/L (ref 3.5–5.1)
SODIUM: 135 mmol/L (ref 135–145)
Total Bilirubin: 0.6 mg/dL (ref 0.3–1.2)
Total Protein: 7 g/dL (ref 6.5–8.1)

## 2017-11-10 ENCOUNTER — Encounter: Admission: RE | Payer: Self-pay | Source: Ambulatory Visit

## 2017-11-10 ENCOUNTER — Ambulatory Visit: Admission: RE | Admit: 2017-11-10 | Payer: Medicare Other | Source: Ambulatory Visit | Admitting: Gastroenterology

## 2017-11-10 SURGERY — COLONOSCOPY WITH PROPOFOL
Anesthesia: General

## 2017-11-11 ENCOUNTER — Other Ambulatory Visit: Payer: Self-pay

## 2017-11-11 ENCOUNTER — Encounter: Payer: Self-pay | Admitting: Gastroenterology

## 2017-11-11 DIAGNOSIS — R110 Nausea: Secondary | ICD-10-CM

## 2017-11-11 DIAGNOSIS — R1310 Dysphagia, unspecified: Secondary | ICD-10-CM

## 2017-11-11 DIAGNOSIS — Z86010 Personal history of colonic polyps: Secondary | ICD-10-CM

## 2017-11-11 DIAGNOSIS — R1013 Epigastric pain: Secondary | ICD-10-CM

## 2017-11-12 NOTE — Telephone Encounter (Signed)
Dr. Lannie Fieldsahiliani-please advise.  Thanks [employer]

## 2017-11-18 ENCOUNTER — Encounter: Payer: Self-pay | Admitting: Internal Medicine

## 2017-11-18 ENCOUNTER — Ambulatory Visit: Payer: Medicare Other | Admitting: Internal Medicine

## 2017-11-18 VITALS — BP 116/80 | HR 78 | Temp 98.2°F | Wt 153.0 lb

## 2017-11-18 DIAGNOSIS — J441 Chronic obstructive pulmonary disease with (acute) exacerbation: Secondary | ICD-10-CM | POA: Diagnosis not present

## 2017-11-18 DIAGNOSIS — J0100 Acute maxillary sinusitis, unspecified: Secondary | ICD-10-CM

## 2017-11-18 MED ORDER — PREDNISONE 10 MG PO TABS: ORAL_TABLET | ORAL | 0 refills | Status: DC

## 2017-11-18 MED ORDER — DOXYCYCLINE HYCLATE 100 MG PO TABS: 100.0000 mg | ORAL_TABLET | Freq: Two times a day (BID) | ORAL | 0 refills | Status: DC

## 2017-11-18 NOTE — Progress Notes (Signed)
HPI  Pt presents to the clinic today with c/o headache, facial pain and pressure, cough and shortness of breath. She reports this started 1 week ago. She is blowing yellow mucous out of her nose. The cough is productive of yellow mucous. She denies fever, but has had some chills and body aches. She has a history of COPD. She is taking her Advair and Spiriva as prescribed. She has been using her nebulizer and Albuterol inhaler about every 4 hours with minimal relief. She has not had sick contacts that she is aware of.   Review of Systems     Past Medical History:  Diagnosis Date  . Anxiety   . Asthmatic bronchitis   . COPD (chronic obstructive pulmonary disease) (HCC)   . Depression   . Diabetes mellitus   . Diverticulitis   . Diverticulosis   . Hyperlipidemia   . Hypertension   . Osteopenia   . Sleep apnea   . Stress-induced cardiomyopathy    a. echo 09/28/2014: EF 45-50%, severe HK of mid-distal anterior, apical and mid-distal inferior walls suggestive of stress induced CM, mildly dilated LA, mildly dilated PASP, mild TR, trivial pericardial effusion    Family History  Problem Relation Age of Onset  . Diabetes Daughter   . Kidney disease Daughter   . Lung cancer Mother        smoked  . COPD Mother        smoked  . COPD Father        smoked  . Prostate cancer Father   . Kidney failure Father   . Breast cancer Maternal Aunt   . Diabetes Maternal Grandmother   . Breast cancer Maternal Grandmother   . Cancer Cousin        colon    Social History   Socioeconomic History  . Marital status: Married    Spouse name: Not on file  . Number of children: Not on file  . Years of education: Not on file  . Highest education level: Not on file  Social Needs  . Financial resource strain: Not on file  . Food insecurity - worry: Not on file  . Food insecurity - inability: Not on file  . Transportation needs - medical: Not on file  . Transportation needs - non-medical: Not on file   Occupational History  . Not on file  Tobacco Use  . Smoking status: Former Smoker    Packs/day: 0.25    Years: 40.00    Pack years: 10.00    Types: Cigarettes    Last attempt to quit: 09/01/2016    Years since quitting: 1.2  . Smokeless tobacco: Never Used  . Tobacco comment: Uses nicotrol inhaler on occasion  Substance and Sexual Activity  . Alcohol use: No  . Drug use: No  . Sexual activity: Not Currently  Other Topics Concern  . Not on file  Social History Narrative   Lives with husband in [place]. 2 dogs. Works at [employer].    Allergies  Allergen Reactions  . Amoxicillin-Pot Clavulanate     REACTION: stomach sensitive.  Lenice Llamas [Roflumilast]     Severe nausea   . Erythromycin Base     REACTION: GI upset  . Neurontin [Gabapentin]     anxiety     Constitutional: Positive headache,. Denies fatigue, fever or abrupt weight changes.  HEENT:  Positive facial pain, nasal congestion. Denies eye redness, ear pain, ringing in the ears, wax buildup, runny nose or sore throat. Respiratory:  Positive cough and shortness of breath. Denies difficulty breathing.  Cardiovascular: Denies chest pain, chest tightness, palpitations or swelling in the hands or feet.   No other specific complaints in a complete review of systems (except as listed in HPI above).  Objective:   BP 116/80   Pulse 78   Temp 98.2 F (36.8 C) (Oral)   Wt 153 lb (69.4 kg)   SpO2 94%   BMI 26.26 kg/m   General: Appears herstated age, in NAD. HEENT: Head: normal shape and size, maxillary sinus tenderness noted; Ears: Tm's gray and intact, normal light reflex; Nose: mucosa boggy and moist, septum midline; Throat/Mouth: + PND. Teeth present, mucosa erythematous and moist, no exudate noted, no lesions or ulcerations noted.  Neck:  No adenopathy noted.  Pulmonary/Chest: Normal effort with bilateral expiratory wheezing noted. No respiratory distress. No wheezes, rales or ronchi noted.       Assessment & Plan:    Acute maxillary Sinusitis/COPD Exacerbation  Can use a Neti Pot which can be purchased from your local drug store. Flonase 2 sprays each nostril for 3 days and then as needed. eRx for Doxycyline 100 mg BID for 10 days eRx for Pred Taper x 6 days Continue inhalers Delsym as needed for cough  RTC as needed or if symptoms persist. Nicki ReaperBAITY, REGINA, NP

## 2017-11-18 NOTE — Patient Instructions (Signed)

## 2017-11-20 ENCOUNTER — Other Ambulatory Visit: Payer: Self-pay | Admitting: Internal Medicine

## 2017-11-20 NOTE — Telephone Encounter (Signed)
Baity pt, out of the office... Last filled 10/16/17.... Please advise

## 2017-11-20 NOTE — Telephone Encounter (Signed)
Refilled in pcp absence  

## 2017-11-23 ENCOUNTER — Telehealth: Payer: Self-pay | Admitting: Internal Medicine

## 2017-11-23 MED ORDER — ALBUTEROL SULFATE HFA 108 (90 BASE) MCG/ACT IN AERS: 1.0000 | INHALATION_SPRAY | RESPIRATORY_TRACT | 4 refills | Status: DC | PRN

## 2017-11-23 NOTE — Telephone Encounter (Signed)
Copied from CRM [phone]. Topic: Inquiry >> Nov 23, 2017 11:52 AM Windy KalataMichael, Saahas Hidrogo L, NT wrote: [facility] because [facility] insurance is denying a inhaler Rx. States her pharmacy is wanting to switch it to a different inhaler and has sent a request but she has not heard anything back. She is wondering if Nicki ReaperRegina Baity does not want to give this new inhaler or what could be the possible hold up. Please advise.

## 2017-11-23 NOTE — Telephone Encounter (Signed)
Ventolin not covered by insurance, [employer] suggested and has been sent to the pharmacy electronically

## 2017-11-24 ENCOUNTER — Ambulatory Visit: Payer: Medicare Other | Admitting: Pulmonary Disease

## 2017-11-24 ENCOUNTER — Encounter: Payer: Self-pay | Admitting: Pulmonary Disease

## 2017-11-24 VITALS — BP 130/82 | HR 71 | Ht 64.0 in | Wt 151.0 lb

## 2017-11-24 DIAGNOSIS — J449 Chronic obstructive pulmonary disease, unspecified: Secondary | ICD-10-CM | POA: Diagnosis not present

## 2017-11-24 DIAGNOSIS — J441 Chronic obstructive pulmonary disease with (acute) exacerbation: Secondary | ICD-10-CM

## 2017-11-24 MED ORDER — LEVOFLOXACIN 500 MG PO TABS: 500.0000 mg | ORAL_TABLET | Freq: Every day | ORAL | 0 refills | Status: AC

## 2017-11-24 MED ORDER — IPRATROPIUM-ALBUTEROL 0.5-2.5 (3) MG/3ML IN SOLN
3.0000 mL | Freq: Once | RESPIRATORY_TRACT | Status: AC
  Administered 2017-11-24: 3 mL via RESPIRATORY_TRACT

## 2017-11-24 NOTE — Progress Notes (Addendum)
PULMONARY OFFICE FOLLOW UP NOTE  Requesting MD/Service: Self Date of initial consultation: 09/29/16 Reason for consultation: COPD, smoker  PT PROFILE: 46 F former smoker (quit October 2017) previously followed by Dr Kendrick Fries for lung nodule and moderate COPD. Maintained on Spiriva, Advair, PRN albuterol with class III dyspnea  DATA: Spirometry 02/03/12: Moderate obstruction (FEV1 1.21 liters, 49% pred) CT chest 09/27/14: Stable right lower lobe pulmonary nodule over over 2 year interval consistent benign etiology PFTs 11/11/16: mod-severe obstruction (FEV1 0.95 liters, 44% pred), mild restriction, severe reduction in DLCO. Lung volumes are porbably underestimated by inadequate test performance CXR 11/28/16: hyperinflation. NACPD PFTs 09/17/17: Moderate obstruction (FEV1 1.20 L, 52% predicted.  Lung volumes normal.  DLCO markedly reduced at 43% predicted.  Borderline improvement with bronchodilator therapy.  INTERVAL: Underwent cardiology evaluation without cardiac explanation for increasing SOB.    SUBJ: This is a routine scheduled re-eval.  She has noted increasing exertional dyspnea particularly notes shortness of breath when bending over to pick something up.  She also reports abdominal distention and nausea.  She has endoscopy scheduled to evaluate this further.  Over the past week, she is noticed increasing shortness of breath, rattling cough, sputum production described as dark yellow.  Last week, her primary physician prescribed doxycycline and a prednisone taper.  She has been on these therapies for 5 or 6 days without significant improvement in her symptoms.Denies CP, fever, hemoptysis, LE edema and calf tenderness.   MEDICATIONS: I have reviewed all medications and confirmed regimen as documented  Vitals:   11/24/17 1031 11/24/17 1036  BP:  130/82  Pulse:  71  SpO2:  96%  Weight: 68.5 kg (151 lb)   Height: 5\' 4"  (1.626 m)    RA  EXAM:  Gen: WDWN, no overt distress but  frequent rattling cough HEENT: NCAT, sclera white, oropharynx normal Neck: Supple without LAN, thyromegaly, JVD Lungs: breath sounds moderately diminished, diffuse coarse wheezes Cardiovascular: Reg, no murmurs Abdomen: Soft, nontender, normal BS Ext: without clubbing, cyanosis, edema Neuro: CNs grossly intact, motor and sensory intact Skin: Limited exam, no lesions noted  DATA:   BMP Latest Ref Rng & Units 11/09/2017 10/31/2016 05/20/2016  Glucose 65 - 99 mg/dL 161(W) 960(A) 540(J)  BUN 6 - 20 mg/dL 20 14 16   Creatinine 0.44 - 1.00 mg/dL 8.11 9.14 7.82  Sodium 135 - 145 mmol/L 135 138 138  Potassium 3.5 - 5.1 mmol/L 4.5 4.2 4.3  Chloride 101 - 111 mmol/L 98(L) 99 102  CO2 22 - 32 mmol/L 29 23 31   Calcium 8.9 - 10.3 mg/dL 9.7 95.6 21.3    CBC Latest Ref Rng & Units 11/09/2017 10/31/2016 05/20/2016  WBC 3.6 - 11.0 K/uL 5.9 6.3 6.5  Hemoglobin 12.0 - 16.0 g/dL 08.6 57.8 46.9  Hematocrit 35.0 - 47.0 % 41.5 41.5 41.5  Platelets 150 - 440 K/uL 232 292 201.0    CXR: No new film  IMPRESSION:   1) Former Smoker  2) Mod COPD  3) chronic asthmatic bronchitis 4)) Exertional hypoxemia 5)) OSA with nocturnal hypoxemia - well treated with CPAP and nocturnal O2 6)) acute COPD exacerbation   PLAN:  DuoNeb was administered in the office today with modest improvement in wheezing. 1) Continue Spiriva and Advair as maintenance inhalers 2) Continue Ventolin as rescue inhaler 3) I encouraged more liberal use of albuterol nebulizer during this acute exacerbation 4) complete prednisone taper as prescribed 5) discontinue doxycycline.  Levofloxacin 500 mg daily for 5 days 6) Continue O2 and CPAP with  sleep.  7) cont O2 with exertion 6) Follow 01/25 for quick recheck   She is scheduled for endoscopy procedure on 01/25.  I expressed to her my concern that these elective procedures should not be performed unless her pulmonary status is optimized.  I will speak with her gastroenterologist   Billy Fischeravid  Simonds, MD PCCM service Mobile [phone] Pager [phone] 11/24/2017 11:19 AM

## 2017-11-24 NOTE — Patient Instructions (Addendum)
Continue Advair and Spiriva Continue albuterol inhaler as needed Until you get back to your baseline, I encourage more liberal use of albuterol nebulizer DuoNeb treatment administered today Complete prednisone taper as prescribed Discontinue doxycycline Levofloxacin 500 mg daily for 5 days Follow-up 11/27/17 for a quick recheck

## 2017-11-26 ENCOUNTER — Other Ambulatory Visit: Payer: Self-pay

## 2017-11-26 ENCOUNTER — Telehealth: Payer: Self-pay | Admitting: Gastroenterology

## 2017-11-26 ENCOUNTER — Telehealth: Payer: Self-pay

## 2017-11-26 NOTE — Telephone Encounter (Signed)
Pt has been informed that we looked at her chart and see her Pulmonologists recommendation about holding off on elective procedures for now. She has been asked to let us know when she is feeling better in the future and if her pulmonologist is ok with the procedures at that time. She has an appt scheduled in March.  Thanks [employer]le

## 2017-11-26 NOTE — Telephone Encounter (Signed)
Patient LVM and wants to make sure her procedure is canceled for tomorrow 1/25. She is sick with bronchitis. The hospital called her.

## 2017-11-27 ENCOUNTER — Ambulatory Visit: Admission: RE | Admit: 2017-11-27 | Payer: Medicare Other | Source: Ambulatory Visit | Admitting: Gastroenterology

## 2017-11-27 ENCOUNTER — Encounter: Payer: Self-pay | Admitting: Pulmonary Disease

## 2017-11-27 ENCOUNTER — Ambulatory Visit (INDEPENDENT_AMBULATORY_CARE_PROVIDER_SITE_OTHER): Payer: Medicare Other | Admitting: Pulmonary Disease

## 2017-11-27 ENCOUNTER — Encounter: Admission: RE | Payer: Self-pay | Source: Ambulatory Visit

## 2017-11-27 VITALS — BP 116/72 | HR 60 | Ht 64.0 in | Wt 154.0 lb

## 2017-11-27 DIAGNOSIS — J9611 Chronic respiratory failure with hypoxia: Secondary | ICD-10-CM

## 2017-11-27 DIAGNOSIS — J441 Chronic obstructive pulmonary disease with (acute) exacerbation: Secondary | ICD-10-CM | POA: Diagnosis not present

## 2017-11-27 SURGERY — COLONOSCOPY WITH PROPOFOL
Anesthesia: General

## 2017-11-27 MED ORDER — METHYLPREDNISOLONE ACETATE 80 MG/ML IJ SUSP: 80.0000 mg | Freq: Once | INTRAMUSCULAR | 0 refills | Status: DC

## 2017-11-27 MED ORDER — METHYLPREDNISOLONE ACETATE 80 MG/ML IJ SUSP
80.0000 mg | Freq: Once | INTRAMUSCULAR | Status: AC
  Administered 2017-11-27: 80 mg via INTRAMUSCULAR

## 2017-11-27 MED ORDER — MAGIC MOUTHWASH: 5.0000 mL | Freq: Three times a day (TID) | ORAL | 5 refills | Status: DC | PRN

## 2017-11-27 NOTE — Progress Notes (Signed)
PULMONARY OFFICE FOLLOW UP NOTE  Requesting MD/Service: Self Date of initial consultation: 09/29/16 Reason for consultation: COPD, smoker  PT PROFILE: 60 F former smoker (quit October 2017) previously followed by Dr Kendrick Fries for lung nodule and moderate COPD. Maintained on Spiriva, Advair, PRN albuterol with class III dyspnea  DATA: Spirometry 02/03/12: Moderate obstruction (FEV1 1.21 liters, 49% pred) CT chest 09/27/14: Stable right lower lobe pulmonary nodule over over 2 year interval consistent benign etiology PFTs 11/11/16: mod-severe obstruction (FEV1 0.95 liters, 44% pred), mild restriction, severe reduction in DLCO. Lung volumes are porbably underestimated by inadequate test performance CXR 11/28/16: hyperinflation. NACPD PFTs 09/17/17: Moderate obstruction (FEV1 1.20 L, 52% predicted.  Lung volumes normal.  DLCO markedly reduced at 43% predicted.  Borderline improvement with bronchodilator therapy.  INTERVAL: Last seen 11/24/17 and treated as COPD exacerbation.  I wanted to check on her before approving that she proceed with endoscopy as planned.  SUBJ: Overall, she is improving.  She estimates that she is "35% improved".  She continues to have cough and sputum production but describes her mucus as much less purulent.  She has no new complaints.  She denies pleuritic chest pain, fever, hemoptysis, lower extremity edema, calf tenderness.  She does report mouth and tongue pain.  She has completed prednisone taper and is completing a course of levofloxacin   MEDICATIONS: I have reviewed all medications and confirmed regimen as documented  Vitals:   11/27/17 1138 11/27/17 1144  BP:  116/72  Pulse:  60  SpO2:  (!) 89%  Weight: 69.9 kg (154 lb)   Height: 5\' 4"  (1.626 m)   RA  EXAM:  Gen: NAD, some coughing during encounter HEENT: NCAT, sclera white, oropharynx normal without evidence of oral thrush  Neck: No JVD noted Lungs: Scattered diffuse wheezes (improved since prior  visit) Cardiovascular: RRR, no murmurs Abdomen: Soft, nontender, normal BS Ext: No clubbing cyanosis or edema Neuro: No focal deficits  DATA:   BMP Latest Ref Rng & Units 11/09/2017 10/31/2016 05/20/2016  Glucose 65 - 99 mg/dL 161(W) 960(A) 540(J)  BUN 6 - 20 mg/dL 20 14 16   Creatinine 0.44 - 1.00 mg/dL 8.11 9.14 7.82  Sodium 135 - 145 mmol/L 135 138 138  Potassium 3.5 - 5.1 mmol/L 4.5 4.2 4.3  Chloride 101 - 111 mmol/L 98(L) 99 102  CO2 22 - 32 mmol/L 29 23 31   Calcium 8.9 - 10.3 mg/dL 9.7 95.6 21.3    CBC Latest Ref Rng & Units 11/09/2017 10/31/2016 05/20/2016  WBC 3.6 - 11.0 K/uL 5.9 6.3 6.5  Hemoglobin 12.0 - 16.0 g/dL 08.6 57.8 46.9  Hematocrit 35.0 - 47.0 % 41.5 41.5 41.5  Platelets 150 - 440 K/uL 232 292 201.0    CXR: No new film  IMPRESSION:   1) Former Smoker  2) chronic hypoxemic respiratory failure 3) COPD with moderate obstruction at baseline 4) resolving acute COPD exacerbation but with persistent wheezing 5) OSA with nocturnal hypoxemia - treated with CPAP and nocturnal O2   PLAN:  Depo-Medrol 80 mg IM administered on this day Instructed to complete her course of levofloxacin Instructed to continue with more liberal use of nebulized bronchodilators until she is back to her baseline Magic mouthwash every 6 hours as needed for mouth pain She is to continue the rest of her medical regimen as previously and as documented on intake data  Follow-up 12/25/17 at which time we will assess whether she is ready to undergo endoscopy   Billy Fischer, MD PCCM service  Mobile [phone] Pager [phone] 11/27/2017 11:49 AM

## 2017-11-27 NOTE — Patient Instructions (Addendum)
Depomedrol 80 mg IM today Complete levofloxacin More liberal use of nebulizer over next few days - at least in morning and before bedtime Magic mouthwash as needed for mouth pain  Continue rest of medications as prescribed  Follow up 02/22 in afternoon at which time we will assess whether you are ready to undergo endoscopy

## 2017-12-04 ENCOUNTER — Encounter: Payer: Self-pay | Admitting: Pulmonary Disease

## 2017-12-14 ENCOUNTER — Other Ambulatory Visit: Payer: Self-pay | Admitting: Internal Medicine

## 2017-12-14 DIAGNOSIS — R110 Nausea: Secondary | ICD-10-CM

## 2017-12-14 DIAGNOSIS — E119 Type 2 diabetes mellitus without complications: Secondary | ICD-10-CM

## 2017-12-15 ENCOUNTER — Encounter: Payer: Self-pay | Admitting: Internal Medicine

## 2017-12-15 ENCOUNTER — Encounter: Payer: Self-pay | Admitting: Pulmonary Disease

## 2017-12-17 ENCOUNTER — Encounter: Payer: Self-pay | Admitting: Pulmonary Disease

## 2017-12-18 ENCOUNTER — Other Ambulatory Visit: Payer: Self-pay | Admitting: Internal Medicine

## 2017-12-18 NOTE — Telephone Encounter (Signed)
Last filled 11/20/17, I have put note to pharmacy TBF on or after 12/21/17 Monday... Please advise

## 2017-12-25 ENCOUNTER — Ambulatory Visit
Admission: RE | Admit: 2017-12-25 | Discharge: 2017-12-25 | Disposition: A | Discharge status: Home / Self Care | Payer: Medicare Other | Source: Ambulatory Visit | Attending: Pulmonary Disease | Admitting: Pulmonary Disease

## 2017-12-25 ENCOUNTER — Ambulatory Visit: Payer: Medicare Other | Admitting: Pulmonary Disease

## 2017-12-25 ENCOUNTER — Encounter: Payer: Self-pay | Admitting: *Deleted

## 2017-12-25 ENCOUNTER — Encounter: Payer: Self-pay | Admitting: Pulmonary Disease

## 2017-12-25 VITALS — BP 138/100 | HR 73 | Ht 64.0 in | Wt 151.0 lb

## 2017-12-25 DIAGNOSIS — J439 Emphysema, unspecified: Secondary | ICD-10-CM | POA: Diagnosis not present

## 2017-12-25 DIAGNOSIS — I700 Atherosclerosis of aorta: Secondary | ICD-10-CM | POA: Insufficient documentation

## 2017-12-25 DIAGNOSIS — J9611 Chronic respiratory failure with hypoxia: Secondary | ICD-10-CM | POA: Diagnosis not present

## 2017-12-25 DIAGNOSIS — G4734 Idiopathic sleep related nonobstructive alveolar hypoventilation: Secondary | ICD-10-CM | POA: Diagnosis not present

## 2017-12-25 DIAGNOSIS — G4733 Obstructive sleep apnea (adult) (pediatric): Secondary | ICD-10-CM

## 2017-12-25 DIAGNOSIS — Z87891 Personal history of nicotine dependence: Secondary | ICD-10-CM | POA: Diagnosis not present

## 2017-12-25 DIAGNOSIS — J449 Chronic obstructive pulmonary disease, unspecified: Secondary | ICD-10-CM

## 2017-12-25 DIAGNOSIS — J441 Chronic obstructive pulmonary disease with (acute) exacerbation: Secondary | ICD-10-CM | POA: Diagnosis not present

## 2017-12-25 MED ORDER — IPRATROPIUM-ALBUTEROL 0.5-2.5 (3) MG/3ML IN SOLN
3.0000 mL | Freq: Once | RESPIRATORY_TRACT | Status: AC
  Administered 2017-12-25: 3 mL via RESPIRATORY_TRACT

## 2017-12-25 MED ORDER — AZITHROMYCIN 250 MG PO TABS: 250.0000 mg | ORAL_TABLET | Freq: Every day | ORAL | 5 refills | Status: DC

## 2017-12-25 MED ORDER — ALBUTEROL SULFATE HFA 108 (90 BASE) MCG/ACT IN AERS: 2.0000 | INHALATION_SPRAY | RESPIRATORY_TRACT | 6 refills | Status: DC | PRN

## 2017-12-25 MED ORDER — METHYLPREDNISOLONE ACETATE 80 MG/ML IJ SUSP
80.0000 mg | Freq: Once | INTRAMUSCULAR | Status: AC
  Administered 2017-12-25: 80 mg via INTRAMUSCULAR

## 2017-12-25 NOTE — Patient Instructions (Addendum)
Continue Advair twice a day Change Spiriva to 1 inhalation at bedtime starting tonight Continue nebulized albuterol up to every 4 hours as needed for shortness of breath and wheezing Continue oxygen therapy with sleep and as needed with exertion during day Depo-Medrol (steroids) administered today Begin a azithromycin 250 mg daily Chest x-ray today Follow-up in 3-4 weeks

## 2017-12-25 NOTE — Progress Notes (Addendum)
PULMONARY OFFICE FOLLOW UP NOTE  Requesting MD/Service: Self Date of initial consultation: 09/29/16 Reason for consultation: COPD, smoker  PT PROFILE: 73 F former smoker (quit October 2017) previously followed by Dr Kendrick Fries for lung nodule and moderate COPD. Maintained on Spiriva, Advair, PRN albuterol with class III dyspnea  DATA: Spirometry 02/03/12: Moderate obstruction (FEV1 1.21 liters, 49% pred) CT chest 09/27/14: Stable right lower lobe pulmonary nodule over over 2 year interval consistent benign etiology PFTs 11/11/16: mod-severe obstruction (FEV1 0.95 liters, 44% pred), mild restriction, severe reduction in DLCO. Lung volumes are probably underestimated by inadequate test performance CXR 11/28/16: hyperinflation. NACPD PFTs 09/17/17: Moderate obstruction (FEV1 1.20 L, 52% predicted.  Lung volumes normal.  DLCO markedly reduced at 43% predicted.  Borderline improvement with bronchodilator therapy.  INTERVAL: Last seen 1/25. No major events  SUBJ: She returns today for routine reevaluation.  Last visit, she was mildly improved after recent treatment for COPD exacerbation.  She was noted to still be wheezing.  Depo-Medrol injection was administered.  This visit was supposed to be a quick recheck to clear her for endoscopy.  However, she continues to struggle with severe exertional dyspnea on minimal exertion.  She has episodes of severe wheezing and chest tightness.  These episodes are relieved with nebulized albuterol.  She has scant yellow mucus throughout the day.  She denies hemoptysis.  Overall, she does not believe that she is any better since last visit.   MEDICATIONS: I have reviewed all medications and confirmed regimen as documented  Vitals:   12/25/17 1339 12/25/17 1346  BP:  (!) 138/100  Pulse:  73  SpO2:  97%  Weight: 68.5 kg (151 lb)   Height: 5\' 4"  (1.626 m)   3 LPM  SpO2 89-92% on RA EXAM:  Gen: NAD HEENT: NCAT, sclera white, OP normal Neck: No JVD  Lungs:  Mildly increased work of breathing, diffuse scattered wheezes Cardiovascular: RRR, no M Abdomen: Soft, nontender, normal BS Ext: No clubbing cyanosis or edema Neuro: No focal deficits  DATA:   BMP Latest Ref Rng & Units 11/09/2017 10/31/2016 05/20/2016  Glucose 65 - 99 mg/dL 161(W) 960(A) 540(J)  BUN 6 - 20 mg/dL 20 14 16   Creatinine 0.44 - 1.00 mg/dL 8.11 9.14 7.82  Sodium 135 - 145 mmol/L 135 138 138  Potassium 3.5 - 5.1 mmol/L 4.5 4.2 4.3  Chloride 101 - 111 mmol/L 98(L) 99 102  CO2 22 - 32 mmol/L 29 23 31   Calcium 8.9 - 10.3 mg/dL 9.7 95.6 21.3    CBC Latest Ref Rng & Units 11/09/2017 10/31/2016 05/20/2016  WBC 3.6 - 11.0 K/uL 5.9 6.3 6.5  Hemoglobin 12.0 - 16.0 g/dL 08.6 57.8 46.9  Hematocrit 35.0 - 47.0 % 41.5 41.5 41.5  Platelets 150 - 440 K/uL 232 292 201.0    CXR: Ordered today to be performed after this visit  IMPRESSION:   Former smoker  COPD, severe (HCC)  Chronic hypoxemic respiratory failure (HCC)  OSA (obstructive sleep apnea)  Nocturnal hypoxemia  Refractory COPD exacerbation   It is unclear why she has such refractory symptoms now which is different than her previous baseline.  It is possible that the wet cold weather is part of her problem.  There also appears to be an anxiety component.  She takes sertraline and alprazolam on a chronic basis for "panic attacks".  She asked today about whether morphine would be beneficial to her.  We discussed the potential role for morphine in end-stage COPD.  We agreed  that she is not to that point at this time.  PLAN:  Continue Advair twice a day Change Spiriva to 1 inhalation at bedtime starting tonight Continue nebulized albuterol up to every 4 hours as needed for shortness of breath and wheezing Continue oxygen therapy with sleep and as needed with exertion during day Depo-Medrol (steroids) administered today Begin a azithromycin 250 mg daily for its anti-inflammatory effect Chest x-ray today -we will call her with  results Follow-up in 3-4 weeks   Billy Fischeravid Chelby Salata, MD PCCM service Mobile [phone] Pager [phone] 12/25/2017 2:13 PM

## 2017-12-25 NOTE — Addendum Note (Signed)
Addended by: Renea EeAHMAD, MISTY R on: 12/25/2017 03:18 PM   Modules accepted: Orders

## 2017-12-27 ENCOUNTER — Other Ambulatory Visit: Payer: Self-pay | Admitting: Internal Medicine

## 2017-12-28 ENCOUNTER — Telehealth: Payer: Self-pay | Admitting: *Deleted

## 2017-12-28 NOTE — Telephone Encounter (Signed)
Pt informed of CXR results. Nothing further needed. 

## 2017-12-28 NOTE — Telephone Encounter (Signed)
Per DS, pt's CXR was normal. Tried to call pt on home phone but it came up as busy. Tried to call cell but unable to leave VM. Will call back later.

## 2017-12-30 ENCOUNTER — Telehealth: Payer: Self-pay | Admitting: Internal Medicine

## 2017-12-30 NOTE — Telephone Encounter (Signed)
Copied from CRM [id number]. Topic: Inquiry >> Dec 30, 2017  1:51 PM Windy KalataMichael, Quinetta Shilling L, NT wrote: Patient is calling and states she would like to [facility] a message [facility]. She states she has a UTI and I offered her an appt but she said [facility] what [facility]. Please contact patient.

## 2017-12-30 NOTE — Telephone Encounter (Signed)
Pt has appt 10am tomorrow for UTI

## 2017-12-31 ENCOUNTER — Ambulatory Visit: Payer: Medicare Other | Admitting: Internal Medicine

## 2017-12-31 ENCOUNTER — Encounter: Payer: Self-pay | Admitting: Internal Medicine

## 2017-12-31 VITALS — BP 126/78 | HR 76 | Temp 97.7°F | Wt 155.0 lb

## 2017-12-31 DIAGNOSIS — R300 Dysuria: Secondary | ICD-10-CM

## 2017-12-31 DIAGNOSIS — N3001 Acute cystitis with hematuria: Secondary | ICD-10-CM

## 2017-12-31 DIAGNOSIS — R311 Benign essential microscopic hematuria: Secondary | ICD-10-CM

## 2017-12-31 LAB — POC URINALSYSI DIPSTICK (AUTOMATED)
BILIRUBIN UA: NEGATIVE
Glucose, UA: NEGATIVE
Ketones, UA: NEGATIVE
NITRITE UA: NEGATIVE
PH UA: 6 (ref 5.0–8.0)
Spec Grav, UA: 1.02 (ref 1.010–1.025)
Urobilinogen, UA: 0.2 E.U./dL

## 2017-12-31 MED ORDER — CEPHALEXIN 500 MG PO CAPS
500.0000 mg | ORAL_CAPSULE | Freq: Two times a day (BID) | ORAL | 0 refills | Status: DC
Start: 2017-12-31 — End: 2018-01-28

## 2017-12-31 NOTE — Patient Instructions (Signed)

## 2017-12-31 NOTE — Addendum Note (Signed)
Addended by: Roena MaladyEVONTENNO, Kimberlyann Hollar Y on: 12/31/2017 12:06 PM   Modules accepted: Orders

## 2017-12-31 NOTE — Progress Notes (Signed)
HPI  Pt presents to the clinic today with c/o dysuria and hematuria. This started 4-5 days ago. She denies urgency, frequency, fever, nausea, chills or low back pain. She has not taken anything OTC for her symptoms. She denies vaginal complaints.   Review of Systems  Past Medical History:  Diagnosis Date  . Anxiety   . Asthmatic bronchitis   . COPD (chronic obstructive pulmonary disease) (HCC)   . Depression   . Diabetes mellitus   . Diverticulitis   . Diverticulosis   . Hyperlipidemia   . Hypertension   . Osteopenia   . Sleep apnea   . Stress-induced cardiomyopathy    a. echo 09/28/2014: EF 45-50%, severe HK of mid-distal anterior, apical and mid-distal inferior walls suggestive of stress induced CM, mildly dilated LA, mildly dilated PASP, mild TR, trivial pericardial effusion    Family History  Problem Relation Age of Onset  . Diabetes Daughter   . Kidney disease Daughter   . Lung cancer Mother        smoked  . COPD Mother        smoked  . COPD Father        smoked  . Prostate cancer Father   . Kidney failure Father   . Breast cancer Maternal Aunt   . Diabetes Maternal Grandmother   . Breast cancer Maternal Grandmother   . Cancer Cousin        colon    Social History   Socioeconomic History  . Marital status: Married    Spouse name: Not on file  . Number of children: Not on file  . Years of education: Not on file  . Highest education level: Not on file  Social Needs  . Financial resource strain: Not on file  . Food insecurity - worry: Not on file  . Food insecurity - inability: Not on file  . Transportation needs - medical: Not on file  . Transportation needs - non-medical: Not on file  Occupational History  . Not on file  Tobacco Use  . Smoking status: Former Smoker    Packs/day: 0.25    Years: 40.00    Pack years: 10.00    Types: Cigarettes    Last attempt to quit: 09/01/2016    Years since quitting: 1.3  . Smokeless tobacco: Never Used  .  Tobacco comment: Uses nicotrol inhaler on occasion  Substance and Sexual Activity  . Alcohol use: No  . Drug use: No  . Sexual activity: Not Currently  Other Topics Concern  . Not on file  Social History Narrative   Lives with husband in [place]. 2 dogs. Works at [employer].    Allergies  Allergen Reactions  . Amoxicillin-Pot Clavulanate     REACTION: stomach sensitive.  Lenice Llamas [Roflumilast]     Severe nausea   . Erythromycin Base     REACTION: GI upset  . Neurontin [Gabapentin]     anxiety     Constitutional: Denies fever, malaise, fatigue, headache or abrupt weight changes.   GU: Pt reports blood in urine and pain with urination. Denies urgency, frequency, burning sensation, odor or discharge.  No other specific complaints in a complete review of systems (except as listed in HPI above).    Objective:   Physical Exam  Wt 155 lb (70.3 kg)   BMI 26.61 kg/m  Wt Readings from Last 3 Encounters:  12/31/17 155 lb (70.3 kg)  12/25/17 151 lb (68.5 kg)  11/27/17 154 lb (69.9 kg)  General: Appears her stated age, well developed, well nourished in NAD. Abdomen: Soft. Normal bowel sounds. No distention or masses noted.  Tender to palpation over the bladder area. No CVA tenderness.       Assessment & Plan:   Hematuria, Dysuria secondary to UTI:  Urinalysis: 3+ leuks, 3+ blood Will send urine culture eRx sent if for Keflex 500 mg BID x 5 days OK to take AZO OTC Drink plenty of fluids  RTC as needed or if symptoms persist. Nicki ReaperBAITY, Deborh Pense, NP

## 2018-01-03 LAB — URINE CULTURE
MICRO NUMBER:: 90262558
SPECIMEN QUALITY: ADEQUATE

## 2018-01-05 ENCOUNTER — Other Ambulatory Visit: Payer: Self-pay | Admitting: *Deleted

## 2018-01-05 ENCOUNTER — Encounter: Payer: Self-pay | Admitting: Pulmonary Disease

## 2018-01-05 ENCOUNTER — Encounter: Payer: Self-pay | Admitting: *Deleted

## 2018-01-05 DIAGNOSIS — J449 Chronic obstructive pulmonary disease, unspecified: Secondary | ICD-10-CM

## 2018-01-05 NOTE — Progress Notes (Signed)
I have placed an order for pt to get a portable nebulizer machine. Ok per DS.

## 2018-01-06 ENCOUNTER — Other Ambulatory Visit: Payer: Self-pay | Admitting: Pulmonary Disease

## 2018-01-06 ENCOUNTER — Encounter: Payer: Self-pay | Admitting: Gastroenterology

## 2018-01-06 MED ORDER — ALBUTEROL SULFATE (2.5 MG/3ML) 0.083% IN NEBU: 2.5000 mg | INHALATION_SOLUTION | Freq: Four times a day (QID) | RESPIRATORY_TRACT | 12 refills | Status: DC | PRN

## 2018-01-06 NOTE — Telephone Encounter (Signed)
Per patient my chart request Albuterol sent to pharmacy.

## 2018-01-10 ENCOUNTER — Other Ambulatory Visit: Payer: Self-pay | Admitting: Internal Medicine

## 2018-01-13 ENCOUNTER — Ambulatory Visit: Payer: Self-pay | Admitting: Gastroenterology

## 2018-01-15 ENCOUNTER — Encounter: Payer: Self-pay | Admitting: Pulmonary Disease

## 2018-01-25 ENCOUNTER — Other Ambulatory Visit: Payer: Self-pay | Admitting: Internal Medicine

## 2018-01-25 NOTE — Telephone Encounter (Signed)
Last filled 12/18/2017... Please advise

## 2018-01-26 ENCOUNTER — Other Ambulatory Visit: Payer: Self-pay | Admitting: Internal Medicine

## 2018-01-28 ENCOUNTER — Encounter: Payer: Self-pay | Admitting: Pulmonary Disease

## 2018-01-28 ENCOUNTER — Ambulatory Visit: Payer: Medicare Other | Admitting: Pulmonary Disease

## 2018-01-28 VITALS — BP 122/70 | HR 51 | Resp 16 | Ht 64.0 in | Wt 153.0 lb

## 2018-01-28 DIAGNOSIS — G4733 Obstructive sleep apnea (adult) (pediatric): Secondary | ICD-10-CM

## 2018-01-28 DIAGNOSIS — J9611 Chronic respiratory failure with hypoxia: Secondary | ICD-10-CM | POA: Diagnosis not present

## 2018-01-28 DIAGNOSIS — J449 Chronic obstructive pulmonary disease, unspecified: Secondary | ICD-10-CM | POA: Diagnosis not present

## 2018-01-28 DIAGNOSIS — Z9989 Dependence on other enabling machines and devices: Secondary | ICD-10-CM | POA: Diagnosis not present

## 2018-01-28 NOTE — Patient Instructions (Signed)
Advair and Spiriva as previously prescribed I encouraged that you reduce your use of albuterol as you are able I encouraged that you wear your oxygen a minimum of 20 hours/day We will work with [facility] to obtain a new CPAP machine for you Follow-up in 3 to 4 months or sooner as needed

## 2018-01-28 NOTE — Progress Notes (Signed)
PULMONARY OFFICE FOLLOW UP NOTE  Requesting MD/Service: Self Date of initial consultation: 09/29/16 Reason for consultation: COPD, smoker  PT PROFILE: 72 F former smoker (quit October 2017) previously followed by Dr Kendrick Fries for lung nodule and moderate COPD. Maintained on Spiriva, Advair, PRN albuterol with class III dyspnea  PROBLEMS: Chronic hypoxemic respiratory failure Very severe COPD Obstructive sleep apnea  DATA: Spirometry 02/03/12: Moderate obstruction (FEV1 1.21 liters, 49% pred) CT chest 09/27/14: Stable right lower lobe pulmonary nodule over over 2 year interval consistent benign etiology PFTs 11/11/16: mod-severe obstruction (FEV1 0.95 liters, 44% pred), mild restriction, severe reduction in DLCO. Lung volumes are probably underestimated by inadequate test performance CXR 11/28/16: hyperinflation. NACPD PFTs 09/17/17: Moderate obstruction (FEV1 1.20 L, 52% predicted.  Lung volumes normal.  DLCO markedly reduced at 43% predicted.  Borderline improvement with bronchodilator therapy.  INTERVAL: Last seen 12/25/17.  No major events since that time  SUBJ: She returns today for routine reevaluation.  She has no new complaints.  She continues to use Advair and Spiriva as her maintenance medications.  She is using albuterol (as MDI or nebulizer) 4 or 5 times per day.  She states that she uses it this frequently because she is "afraid of what might happen if she does not use it".  She came into the office off of oxygen therapy with an oxygen saturation of 80%.  She was placed on 3 L (pulse) with improvement to 97%.    She denies CP, fever, purulent sputum, hemoptysis, LE edema and calf tenderness.     She remains compliant with her CPAP.  Her CPAP machine is quite old and she requests a new machine.   MEDICATIONS: I have reviewed all medications and confirmed regimen as documented  Vitals:   01/28/18 1131 01/28/18 1137 01/28/18 1141  BP:  122/70   Pulse:  (!) 51   Resp: 16     SpO2:  (!) 80% 97%  Weight: 153 lb (69.4 kg)    Height: 5\' 4"  (1.626 m)    3 LPM  EXAM:   Gen: WDWN in NAD HEENT: NCAT, sclerae white, oropharynx normal Neck: No LAN, no JVD noted Lungs: Mildly diminished BS, minimal scattered bilateral wheezes Cardiovascular: Regular, normal rate, no M noted Abdomen: Soft, NT, +BS Ext: no C/C/E Neuro: grossly intact Skin: No lesions noted   DATA:   BMP Latest Ref Rng & Units 11/09/2017 10/31/2016 05/20/2016  Glucose 65 - 99 mg/dL 130(Q) 657(Q) 469(G)  BUN 6 - 20 mg/dL 20 14 16   Creatinine 0.44 - 1.00 mg/dL 2.95 2.84 1.32  Sodium 135 - 145 mmol/L 135 138 138  Potassium 3.5 - 5.1 mmol/L 4.5 4.2 4.3  Chloride 101 - 111 mmol/L 98(L) 99 102  CO2 22 - 32 mmol/L 29 23 31   Calcium 8.9 - 10.3 mg/dL 9.7 44.0 10.2    CBC Latest Ref Rng & Units 11/09/2017 10/31/2016 05/20/2016  WBC 3.6 - 11.0 K/uL 5.9 6.3 6.5  Hemoglobin 12.0 - 16.0 g/dL 72.5 36.6 44.0  Hematocrit 35.0 - 47.0 % 41.5 41.5 41.5  Platelets 150 - 440 K/uL 232 292 201.0    CXR: No new film  IMPRESSION:   Very severe COPD - Plan: Ambulatory Referral for DME  OSA on CPAP - Plan: Ambulatory Referral for DME  Chronic hypoxemic respiratory failure (HCC) - Plan: Ambulatory Referral for DME     PLAN:  Continue Advair and Spiriva as previously prescribed I encouraged that she reduce her use of albuterol as you  are able I encouraged that she wear her oxygen a minimum of 20 hours/day We will work with [facility] to obtain a new CPAP machine for you Follow-up in 3 to 4 months or sooner as needed   Billy Fischeravid Amsi Grimley, MD PCCM service Mobile [phone] Pager [phone] 02/01/2018 1:09 PM

## 2018-02-16 ENCOUNTER — Other Ambulatory Visit: Payer: Self-pay | Admitting: Internal Medicine

## 2018-02-28 ENCOUNTER — Other Ambulatory Visit: Payer: Self-pay | Admitting: Internal Medicine

## 2018-03-01 NOTE — Telephone Encounter (Signed)
Please Advise

## 2018-03-12 ENCOUNTER — Telehealth: Payer: Self-pay | Admitting: Pulmonary Disease

## 2018-03-12 NOTE — Telephone Encounter (Signed)
lmov to r/s appt with dr. Sung Amabile due to schedule changes

## 2018-03-16 NOTE — Telephone Encounter (Signed)
R/s on 7/16

## 2018-03-19 ENCOUNTER — Other Ambulatory Visit: Payer: Self-pay | Admitting: Internal Medicine

## 2018-03-19 MED ORDER — FLUTICASONE-SALMETEROL 250-50 MCG/DOSE IN AEPB: 1.0000 | INHALATION_SPRAY | Freq: Two times a day (BID) | RESPIRATORY_TRACT | 10 refills | Status: DC

## 2018-03-25 ENCOUNTER — Ambulatory Visit: Payer: Medicare Other | Admitting: Internal Medicine

## 2018-03-25 ENCOUNTER — Encounter: Payer: Self-pay | Admitting: Internal Medicine

## 2018-03-25 VITALS — BP 122/66 | HR 76 | Temp 98.4°F | Wt 154.0 lb

## 2018-03-25 DIAGNOSIS — R300 Dysuria: Secondary | ICD-10-CM | POA: Diagnosis not present

## 2018-03-25 DIAGNOSIS — N3000 Acute cystitis without hematuria: Secondary | ICD-10-CM | POA: Diagnosis not present

## 2018-03-25 DIAGNOSIS — R351 Nocturia: Secondary | ICD-10-CM

## 2018-03-25 LAB — POC URINALSYSI DIPSTICK (AUTOMATED)
BILIRUBIN UA: NEGATIVE
GLUCOSE UA: NEGATIVE
KETONES UA: NEGATIVE
Nitrite, UA: NEGATIVE
Protein, UA: POSITIVE — AB
SPEC GRAV UA: 1.02 (ref 1.010–1.025)
Urobilinogen, UA: 0.2 E.U./dL
pH, UA: 6 (ref 5.0–8.0)

## 2018-03-25 MED ORDER — SULFAMETHOXAZOLE-TRIMETHOPRIM 800-160 MG PO TABS: 1.0000 | ORAL_TABLET | Freq: Two times a day (BID) | ORAL | 0 refills | Status: DC

## 2018-03-25 NOTE — Patient Instructions (Signed)

## 2018-03-25 NOTE — Progress Notes (Signed)
HPI  Pt presents to the clinic today with c/o dysuria and nocturia. She reports this started 4-5 days ago. She denies urgency, frequency or blood in her urine. She denies fever, chills, nausea or low back pain. She has not taken anything OTC for her symptoms. Her last UTI was 12/2017, culture grew out Proteus, treated with Keflex.   Review of Systems  Past Medical History:  Diagnosis Date  . Anxiety   . Asthmatic bronchitis   . COPD (chronic obstructive pulmonary disease) (HCC)   . Depression   . Diabetes mellitus   . Diverticulitis   . Diverticulosis   . Hyperlipidemia   . Hypertension   . Osteopenia   . Sleep apnea   . Stress-induced cardiomyopathy    a. echo 09/28/2014: EF 45-50%, severe HK of mid-distal anterior, apical and mid-distal inferior walls suggestive of stress induced CM, mildly dilated LA, mildly dilated PASP, mild TR, trivial pericardial effusion    Family History  Problem Relation Age of Onset  . Diabetes Daughter   . Kidney disease Daughter   . Lung cancer Mother        smoked  . COPD Mother        smoked  . COPD Father        smoked  . Prostate cancer Father   . Kidney failure Father   . Breast cancer Maternal Aunt   . Diabetes Maternal Grandmother   . Breast cancer Maternal Grandmother   . Cancer Cousin        colon    Social History   Socioeconomic History  . Marital status: Married    Spouse name: Not on file  . Number of children: Not on file  . Years of education: Not on file  . Highest education level: Not on file  Occupational History  . Not on file  Social Needs  . Financial resource strain: Not on file  . Food insecurity:    Worry: Not on file    Inability: Not on file  . Transportation needs:    Medical: Not on file    Non-medical: Not on file  Tobacco Use  . Smoking status: Former Smoker    Packs/day: 0.25    Years: 40.00    Pack years: 10.00    Types: Cigarettes    Last attempt to quit: 09/01/2016    Years since  quitting: 1.5  . Smokeless tobacco: Never Used  . Tobacco comment: Uses nicotrol inhaler on occasion  Substance and Sexual Activity  . Alcohol use: No  . Drug use: No  . Sexual activity: Not Currently  Lifestyle  . Physical activity:    Days per week: Not on file    Minutes per session: Not on file  . Stress: Not on file  Relationships  . Social connections:    Talks on phone: Not on file    Gets together: Not on file    Attends religious service: Not on file    Active member of club or organization: Not on file    Attends meetings of clubs or organizations: Not on file    Relationship status: Not on file  . Intimate partner violence:    Fear of current or ex partner: Not on file    Emotionally abused: Not on file    Physically abused: Not on file    Forced sexual activity: Not on file  Other Topics Concern  . Not on file  Social History Narrative   Lives with  husband in [place]. 2 dogs. Works at [employer].    Allergies  Allergen Reactions  . Amoxicillin-Pot Clavulanate     REACTION: stomach sensitive.  Lenice Llamas [Roflumilast]     Severe nausea   . Erythromycin Base     REACTION: GI upset  . Neurontin [Gabapentin]     anxiety     Constitutional: Denies fever, malaise, fatigue, headache or abrupt weight changes.   GU: Pt reports nocturia and pain with urination. Denies urgency, frequency, burning sensation, blood in urine, odor or discharge.   No other specific complaints in a complete review of systems (except as listed in HPI above).    Objective:   Physical Exam  BP 122/66   Pulse 76   Temp 98.4 F (36.9 C) (Oral)   Wt 154 lb (69.9 kg)   SpO2 93%   BMI 26.43 kg/m   Wt Readings from Last 3 Encounters:  03/25/18 154 lb (69.9 kg)  01/28/18 153 lb (69.4 kg)  12/31/17 155 lb (70.3 kg)    General: Appears her stated age, well developed, well nourished in NAD. Abdomen: Soft. Normal bowel sounds. No distention or masses noted.  Tender to palpation over the  bladder area. No CVA tenderness.        Assessment & Plan:   Nocturia, Dysuria secondary to UTI:  Urinalysis: 2+ lueks, 2+ blood Not enough urine to send urine culture eRx sent if for Septra BID x 5 days OK to take AZO OTC Drink plenty of fluids  RTC as needed or if symptoms persist. [name], NP

## 2018-03-25 NOTE — Addendum Note (Signed)
Addended by: Littie Deeds Y on: 03/25/2018 03:00 PM   Modules accepted: Orders

## 2018-04-01 ENCOUNTER — Ambulatory Visit: Payer: Medicare Other | Admitting: Internal Medicine

## 2018-04-05 ENCOUNTER — Other Ambulatory Visit: Payer: Self-pay | Admitting: Internal Medicine

## 2018-04-11 ENCOUNTER — Other Ambulatory Visit: Payer: Self-pay | Admitting: Internal Medicine

## 2018-04-12 NOTE — Telephone Encounter (Signed)
Needs POCT A1C

## 2018-04-12 NOTE — Telephone Encounter (Signed)
pts last labs 11/2016. pls advise

## 2018-04-27 ENCOUNTER — Other Ambulatory Visit: Payer: Self-pay | Admitting: Internal Medicine

## 2018-04-27 ENCOUNTER — Telehealth: Payer: Self-pay | Admitting: *Deleted

## 2018-04-27 ENCOUNTER — Encounter: Payer: Self-pay | Admitting: Internal Medicine

## 2018-04-27 NOTE — Telephone Encounter (Signed)
Copied from CRM [id number]. Topic: Appointment Scheduling - Scheduling Inquiry for Clinic >> Apr 27, 2018 10:09 AM Oneal GroutSebastian, Jennifer S wrote: Reason for CRM: Patient is requesting to be seen today by Rene Kocheregina, does not want to see anyone else. Thinks she has a UTI, unable to hold urine, pain, blood in urine. Please advise

## 2018-04-27 NOTE — Telephone Encounter (Signed)
Last office visit 03/25/18 Last refill same date #10

## 2018-04-28 ENCOUNTER — Ambulatory Visit: Payer: Medicare Other | Admitting: Internal Medicine

## 2018-04-28 ENCOUNTER — Encounter: Payer: Self-pay | Admitting: Internal Medicine

## 2018-04-28 VITALS — BP 124/82 | HR 72 | Temp 98.3°F | Wt 160.0 lb

## 2018-04-28 DIAGNOSIS — N3000 Acute cystitis without hematuria: Secondary | ICD-10-CM

## 2018-04-28 DIAGNOSIS — N3946 Mixed incontinence: Secondary | ICD-10-CM | POA: Diagnosis not present

## 2018-04-28 DIAGNOSIS — R300 Dysuria: Secondary | ICD-10-CM

## 2018-04-28 LAB — POC URINALSYSI DIPSTICK (AUTOMATED)
Bilirubin, UA: NEGATIVE
Glucose, UA: NEGATIVE
Ketones, UA: NEGATIVE
NITRITE UA: NEGATIVE
PH UA: 6 (ref 5.0–8.0)
PROTEIN UA: POSITIVE — AB
Spec Grav, UA: 1.015 (ref 1.010–1.025)
UROBILINOGEN UA: 0.2 U/dL

## 2018-04-28 MED ORDER — CEPHALEXIN 500 MG PO CAPS: 500.0000 mg | ORAL_CAPSULE | Freq: Two times a day (BID) | ORAL | 0 refills | Status: DC

## 2018-04-28 NOTE — Addendum Note (Signed)
Addended by: Roena MaladyEVONTENNO, Niasia Lanphear Y on: 04/28/2018 04:39 PM   Modules accepted: Orders

## 2018-04-28 NOTE — Progress Notes (Signed)
HPI  Pt presents to the clinic today with c/o dysuria and incontinence. The dysuria started 4-5 days ago. The incontinenc has been an ongoing issue. She denies urgency, frequency or blood in her urine. She denies fever, chills, nausea or low back pain. She saw urology for her incontinence issues, was told she had a cystocele. She is having to wear pads and plastic underpants. She has not tried anything OTC for her symptoms. She denies vaginal issues.    Review of Systems  Past Medical History:  Diagnosis Date  . Anxiety   . Asthmatic bronchitis   . COPD (chronic obstructive pulmonary disease) (HCC)   . Depression   . Diabetes mellitus   . Diverticulitis   . Diverticulosis   . Hyperlipidemia   . Hypertension   . Osteopenia   . Sleep apnea   . Stress-induced cardiomyopathy    a. echo 09/28/2014: EF 45-50%, severe HK of mid-distal anterior, apical and mid-distal inferior walls suggestive of stress induced CM, mildly dilated LA, mildly dilated PASP, mild TR, trivial pericardial effusion    Family History  Problem Relation Age of Onset  . Diabetes Daughter   . Kidney disease Daughter   . Lung cancer Mother        smoked  . COPD Mother        smoked  . COPD Father        smoked  . Prostate cancer Father   . Kidney failure Father   . Breast cancer Maternal Aunt   . Diabetes Maternal Grandmother   . Breast cancer Maternal Grandmother   . Cancer Cousin        colon    Social History   Socioeconomic History  . Marital status: Married    Spouse name: Not on file  . Number of children: Not on file  . Years of education: Not on file  . Highest education level: Not on file  Occupational History  . Not on file  Social Needs  . Financial resource strain: Not on file  . Food insecurity:    Worry: Not on file    Inability: Not on file  . Transportation needs:    Medical: Not on file    Non-medical: Not on file  Tobacco Use  . Smoking status: Former Smoker    Packs/day:  0.25    Years: 40.00    Pack years: 10.00    Types: Cigarettes    Last attempt to quit: 09/01/2016    Years since quitting: 1.6  . Smokeless tobacco: Never Used  . Tobacco comment: Uses nicotrol inhaler on occasion  Substance and Sexual Activity  . Alcohol use: No  . Drug use: No  . Sexual activity: Not Currently  Lifestyle  . Physical activity:    Days per week: Not on file    Minutes per session: Not on file  . Stress: Not on file  Relationships  . Social connections:    Talks on phone: Not on file    Gets together: Not on file    Attends religious service: Not on file    Active member of club or organization: Not on file    Attends meetings of clubs or organizations: Not on file    Relationship status: Not on file  . Intimate partner violence:    Fear of current or ex partner: Not on file    Emotionally abused: Not on file    Physically abused: Not on file    Forced sexual  activity: Not on file  Other Topics Concern  . Not on file  Social History Narrative   [place] in [place]. 2 dogs. Works at [employer].    Allergies  Allergen Reactions  . Amoxicillin-Pot Clavulanate     REACTION: stomach sensitive.  Lenice Llamas. Daliresp [Roflumilast]     Severe nausea   . Erythromycin Base     REACTION: GI upset  . Neurontin [Gabapentin]     anxiety     Constitutional: Denies fever, malaise, fatigue, headache or abrupt weight changes.   GU: Pt reports urinary incontinence and pain with urination. Denies urgency, frequency, burning sensation, blood in urine, odor or discharge.   No other specific complaints in a complete review of systems (except as listed in HPI above).    Objective:   Physical Exam BP 124/82   Pulse 72   Temp 98.3 F (36.8 C) (Oral)   Wt 160 lb (72.6 kg)   SpO2 93%   BMI 27.46 kg/m   Wt Readings from Last 3 Encounters:  03/25/18 154 lb (69.9 kg)  01/28/18 153 lb (69.4 kg)  12/31/17 155 lb (70.3 kg)    General: Appears her stated age, well  developed, well nourished in NAD. Abdomen: Soft. Normal bowel sounds. No distention or masses noted.  Tender to palpation over the bladder area. No CVA tenderness.        Assessment & Plan:   Dysuria secondary to UTI:  Urinalysis: 3+ leuks, 3+ blood Will send urine culture eRx sent if for Keflex 500 mg BID x 5 days OK to take AZO OTC Drink plenty of fluids  Urinary Incontinence:  Continue pads and plastic underwear Discussed the importance of kegel exercises and timed voiding Avoid caffeine She will follow up with urology to discuss medication vs surgical intervention  RTC as needed or if symptoms persist. Nicki Reaperegina Ole Lafon, NP

## 2018-04-28 NOTE — Telephone Encounter (Signed)
appt schedule for today

## 2018-04-28 NOTE — Patient Instructions (Signed)

## 2018-04-30 LAB — URINE CULTURE
MICRO NUMBER:: 90763836
SPECIMEN QUALITY:: ADEQUATE

## 2018-05-03 ENCOUNTER — Telehealth: Payer: Self-pay | Admitting: Pulmonary Disease

## 2018-05-03 ENCOUNTER — Other Ambulatory Visit: Payer: Self-pay | Admitting: Internal Medicine

## 2018-05-03 NOTE — Telephone Encounter (Signed)
Please call regarding an addendum from 3/25 note to state pt is benefiting from the CPAP

## 2018-05-03 NOTE — Telephone Encounter (Signed)
[facility] is calling wanting you to make an addend your note say that patient is using CPAP and benefiting from it.

## 2018-05-03 NOTE — Telephone Encounter (Signed)
Last filled 08/2017 #90 with 1 refill... Please advise

## 2018-05-07 ENCOUNTER — Other Ambulatory Visit: Payer: Self-pay | Admitting: Internal Medicine

## 2018-05-07 ENCOUNTER — Encounter: Payer: Self-pay | Admitting: Internal Medicine

## 2018-05-07 NOTE — Telephone Encounter (Signed)
Name of Medication: Alprazolam Name of Pharmacy: [facility] Last Fill or Written Date and Quantity: 04/06/2018 for #90 with no refills.   Last Office Visit and Type: 04/28/2018 Dysuria Next Office Visit and Type:  No future appointments scheduled  with R. Baity Last Controlled Substance Agreement Date: Never Last UDS: Never

## 2018-05-17 ENCOUNTER — Ambulatory Visit: Payer: Self-pay | Admitting: Pulmonary Disease

## 2018-05-18 ENCOUNTER — Ambulatory Visit: Payer: Medicare Other | Admitting: Pulmonary Disease

## 2018-05-18 VITALS — BP 114/68 | HR 75 | Ht 64.0 in | Wt 161.0 lb

## 2018-05-18 DIAGNOSIS — J449 Chronic obstructive pulmonary disease, unspecified: Secondary | ICD-10-CM

## 2018-05-18 DIAGNOSIS — J9611 Chronic respiratory failure with hypoxia: Secondary | ICD-10-CM | POA: Diagnosis not present

## 2018-05-18 DIAGNOSIS — M25472 Effusion, left ankle: Secondary | ICD-10-CM | POA: Diagnosis not present

## 2018-05-18 DIAGNOSIS — G4733 Obstructive sleep apnea (adult) (pediatric): Secondary | ICD-10-CM | POA: Diagnosis not present

## 2018-05-18 DIAGNOSIS — Z9989 Dependence on other enabling machines and devices: Secondary | ICD-10-CM

## 2018-05-18 MED ORDER — ARFORMOTEROL TARTRATE 15 MCG/2ML IN NEBU: 15.0000 ug | INHALATION_SOLUTION | Freq: Two times a day (BID) | RESPIRATORY_TRACT | 10 refills | Status: DC

## 2018-05-18 MED ORDER — BUDESONIDE 0.25 MG/2ML IN SUSP: 0.2500 mg | Freq: Two times a day (BID) | RESPIRATORY_TRACT | 10 refills | Status: DC

## 2018-05-18 NOTE — Patient Instructions (Addendum)
Continue Spiriva as previously prescribed Continue oxygen as close to 24 hours/day as possible Continue CPAP with sleep  We will try to get nebulized medications to replace her Advair/Wilexa inhaler.  If/when we get those medications for you, stop the inhaler and use each medicine in the nebulizer twice a day.    Ultrasound evaluation of your left leg to rule out a blood clot  Follow-up in 4-6 wks

## 2018-05-18 NOTE — Progress Notes (Signed)
PULMONARY OFFICE FOLLOW UP NOTE  Requesting MD/Service: Self Date of initial consultation: 09/29/16 Reason for consultation: COPD, smoker  PT PROFILE: [age] F former smoker (quit October 2017) previously followed by Dr Kendrick FriesMcQuaid for lung nodule and moderate COPD. Maintained on Spiriva, Advair, PRN albuterol with class III dyspnea  PROBLEMS: Chronic hypoxemic respiratory failure Very severe COPD Obstructive sleep apnea  DATA: Spirometry 02/03/12: Moderate obstruction (FEV1 1.21 liters, 49% pred) CT chest 09/27/14: Stable right lower lobe pulmonary nodule over over 2 year interval consistent benign etiology PFTs 11/11/16: mod-severe obstruction (FEV1 0.95 liters, 44% pred), mild restriction, severe reduction in DLCO. Lung volumes are probably underestimated by inadequate test performance CXR 11/28/16: hyperinflation. NACPD PFTs 09/17/17: Moderate obstruction (FEV1 1.20 L, 52% predicted.  Lung volumes normal.  DLCO markedly reduced at 43% predicted.  Borderline improvement with bronchodilator therapy  INTERVAL: Last seen 01/28/18.  No major events since that time   SUBJ: She returns today for scheduled reevaluation.  She has no new complaints.  She continues to use Advair and Spiriva as her maintenance medications.  She continues to use albuterol (as MDI or nebulizer) 2-4 times per day.She is wearing O2 24 h/d.  She reports L ankle edema X 3 weeks. Denies calf tenderness. Also denies CP, fever, purulent sputum, hemoptysis.  Reports recurrent nausea and dyspnea when bending. She is undergoing GI evaluation for this.   She remains compliant with her CPAP as evidenced by most recent compliance report that was reviewed by me. She is benefiting from CPAP   MEDICATIONS: I have reviewed all medications and confirmed regimen as documented  Vitals:   05/18/18 1401 05/18/18 1408  BP:  114/68  Pulse:  75  SpO2:  92%  Weight: 161 lb (73 kg)   Height: 5\' 4"  (1.626 m)   2 LPM  EXAM:    NAD HEENT WNL No JVD noted Markedly diminished BS, very distant wheezes RRR s M Abd soft, NABS L ankle edema No focal neuro deficits  DATA:   BMP Latest Ref Rng & Units 11/09/2017 10/31/2016 05/20/2016  Glucose 65 - 99 mg/dL 696(E117(H) 952(W111(H) 413(K129(H)  BUN 6 - 20 mg/dL 20 14 16   Creatinine 0.44 - 1.00 mg/dL 4.400.94 1.020.82 7.250.75  Sodium 135 - 145 mmol/L 135 138 138  Potassium 3.5 - 5.1 mmol/L 4.5 4.2 4.3  Chloride 101 - 111 mmol/L 98(L) 99 102  CO2 22 - 32 mmol/L 29 23 31   Calcium 8.9 - 10.3 mg/dL 9.7 36.610.2 44.010.1    CBC Latest Ref Rng & Units 11/09/2017 10/31/2016 05/20/2016  WBC 3.6 - 11.0 K/uL 5.9 6.3 6.5  Hemoglobin 12.0 - 16.0 g/dL 34.713.6 42.513.6 95.613.9  Hematocrit 35.0 - 47.0 % 41.5 41.5 41.5  Platelets 150 - 440 K/uL 232 292 201.0    CXR: No new film  IMPRESSION:   COPD, very severe (HCC)  OSA on CPAP - she is compliant with therapy and benefiting from it  Chronic hypoxemic respiratory failure - She is wearing O2 compliantly and benefiting from it   We discussed options @ this time. She is frustrated with her severe limitation and wishes to try neblized steroids/LABA in place of DPI ICS/LABA in hopes that better medication delivery might lead to some improvement  PLAN:  Continue Spiriva as previously prescribed Continue oxygen as close to 24 hours/day as possible Continue CPAP with sleep  We will try to get nebulized medications to replace her Advair/Wilexa inhaler.  If/when we get those medications approved, she is to stop  the ICS/LABA inhaler and use each medicine in the nebulizer twice a day.    Ultrasound evaluation of LLE to rule out a blood clot  Follow-up in 4-6 wks  Billy Fischer, MD PCCM service Mobile [phone] Pager [phone] 05/18/2018 2:09 PM

## 2018-05-19 MED ORDER — ARFORMOTEROL TARTRATE 15 MCG/2ML IN NEBU: 15.0000 ug | INHALATION_SOLUTION | Freq: Two times a day (BID) | RESPIRATORY_TRACT | 3 refills | Status: DC

## 2018-05-19 MED ORDER — BUDESONIDE 0.25 MG/2ML IN SUSP: 0.2500 mg | Freq: Two times a day (BID) | RESPIRATORY_TRACT | 3 refills | Status: DC

## 2018-05-19 NOTE — Progress Notes (Signed)
3:02 PM  01/16/16  Tami Skinner [birth year] [id number]  Referring provider: Lorre Munroe, NP [address] [place], [place] [address]  Chief Complaint  Patient presents with  . Overactive bladder    HPI: Patient is a 72 year old Caucasian female with a history of mixed urinary incontinence, overactive bladder and incomplete bladder emptying who presents today for an office visit for hematuria.  She states that she has been spotting like she has had her period.  She does not have a prior history of recurrent urinary tract infections, nephrolithiasis, trauma to the genitourinary tract or malignancies of the genitourinary tract.   She does not have a family medical history of nephrolithiasis, malignancies of the genitourinary tract or hematuria.   She is having symptoms of frequent urination, urgency, dysuria, nocturia, incontinence, hesitancy, intermittency, straining to urinate and a weak urinary stream.  Patient denies any suprapubic/flank pain.  Patient denies any fevers, chills, nausea or vomiting. Her UA today demonstrates > 30 WBC's, 11-30 RBC's and moderate bacteria.    She is a former smoker.    PMH: Past Medical History:  Diagnosis Date  . Anxiety   . Asthmatic bronchitis   . COPD (chronic obstructive pulmonary disease) (HCC)   . Depression   . Diabetes mellitus   . Diverticulitis   . Diverticulosis   . Hyperlipidemia   . Hypertension   . Osteopenia   . Sleep apnea   . Stress-induced cardiomyopathy    a. echo 09/28/2014: EF 45-50%, severe HK of mid-distal anterior, apical and mid-distal inferior walls suggestive of stress induced CM, mildly dilated LA, mildly dilated PASP, mild TR, trivial pericardial effusion    Surgical History: Past Surgical History:  Procedure Laterality Date  . ABDOMINAL HYSTERECTOMY     partial  . BREAST BIOPSY Right    neg  . CARDIAC CATHETERIZATION  09/29/2014   [facility]  . NASAL SINUS SURGERY      Home Medications:    Allergies as of 05/20/2018      Reactions   Amoxicillin-pot Clavulanate    REACTION: stomach sensitive.   Daliresp [roflumilast]    Severe nausea    Erythromycin Base    REACTION: GI upset   Neurontin [gabapentin]    anxiety      Medication List        Accurate as of 05/20/18  3:02 PM. Always use your most recent med list.          albuterol 108 (90 Base) MCG/ACT inhaler Commonly known as:  PROVENTIL HFA;VENTOLIN HFA Inhale 2 puffs into the lungs every 4 (four) hours as needed for wheezing or shortness of breath.   albuterol (2.5 MG/3ML) 0.083% nebulizer solution Commonly known as:  PROVENTIL Take 3 mLs (2.5 mg total) by nebulization every 6 (six) hours as needed for wheezing or shortness of breath.   ALPRAZolam 0.5 MG tablet Commonly known as:  XANAX TAKE ONE TABLET BY MOUTH EVERY 8 HOURS DNF 12/21/17   arformoterol 15 MCG/2ML Nebu Commonly known as:  BROVANA Take 2 mLs (15 mcg total) by nebulization 2 (two) times daily. DX: COPD J44.9   azithromycin 250 MG tablet Commonly known as:  ZITHROMAX Take 250 mg by mouth daily.   budesonide 0.25 MG/2ML nebulizer solution Commonly known as:  PULMICORT Take 2 mLs (0.25 mg total) by nebulization 2 (two) times daily. DX: COPD J44.9   conjugated estrogens vaginal cream Commonly known as:  PREMARIN Apply 0.5mg  (pea-sized amount)  just inside the vaginal  introitus with a finger-tip on  Monday, Wednesday and Friday nights.   estradiol 0.1 MG/GM vaginal cream Commonly known as:  ESTRACE VAGINAL Apply 0.5mg  (pea-sized amount)  just inside the vaginal introitus with a finger-tip on Monday, Wednesday and Friday nights.   glucose blood test strip Commonly known as:  ONE TOUCH ULTRA TEST 1 each by Other route 2 (two) times daily. Use as instructed   losartan 50 MG tablet Commonly known as:  COZAAR Take 1 tablet (50 mg total) by mouth daily.   metFORMIN 500 MG tablet Commonly known as:  GLUCOPHAGE TAKE 1 TABLET BY MOUTH TWICE  A DAY WITH A MEAL   metoprolol succinate 50 MG 24 hr tablet Commonly known as:  TOPROL-XL Take 1 tablet (50 mg total) by mouth daily. Take with or immediately following a meal.   omeprazole 20 MG capsule Commonly known as:  PRILOSEC TAKE 1 CAPSULE (20 MG TOTAL) DAILY BY MOUTH.   ondansetron 4 MG tablet Commonly known as:  ZOFRAN TAKE 1 TABLET BY MOUTH EVERY 8 HOURS AS NEEDED   OXYGEN Inhale 2 % into the lungs at bedtime.   rosuvastatin 5 MG tablet Commonly known as:  CRESTOR Take 1 tablet (5 mg total) by mouth daily.   sertraline 100 MG tablet Commonly known as:  ZOLOFT TAKE 2 TABLETS BY MOUTH TWICE A DAY   SPIRIVA HANDIHALER 18 MCG inhalation capsule Generic drug:  tiotropium INHALE 1 CAPSULE VIA HANDIHALER ONCE DAILY AT THE SAME TIME EVERY DAY   Vitamin D-3 1000 units Caps Take 2 capsules by mouth.   WIXELA INHUB 250-50 MCG/DOSE Aepb Generic drug:  Fluticasone-Salmeterol Inhale 1 puff into the lungs 2 (two) times daily.       Allergies:  Allergies  Allergen Reactions  . Amoxicillin-Pot Clavulanate     REACTION: stomach sensitive.  Lenice Llamas. Daliresp [Roflumilast]     Severe nausea   . Erythromycin Base     REACTION: GI upset  . Neurontin [Gabapentin]     anxiety    Family History: Family History  Problem Relation Age of Onset  . Diabetes Daughter   . Kidney disease Daughter   . Lung cancer Mother        smoked  . COPD Mother        smoked  . COPD Father        smoked  . Prostate cancer Father   . Kidney failure Father   . Breast cancer Maternal Aunt   . Diabetes Maternal Grandmother   . Breast cancer Maternal Grandmother   . Cancer Cousin        colon    Social History:  reports that she quit smoking about 20 months ago. Her smoking use included cigarettes. She has a 10.00 pack-year smoking history. She has never used smokeless tobacco. She reports that she does not drink alcohol or use drugs.  ROS: UROLOGY Frequent Urination?: Yes Hard to  postpone urination?: Yes Burning/pain with urination?: Yes Get up at night to urinate?: Yes Leakage of urine?: Yes Urine stream starts and stops?: Yes Trouble starting stream?: Yes Do you have to strain to urinate?: Yes Blood in urine?: Yes Urinary tract infection?: Yes Sexually transmitted disease?: No Injury to kidneys or bladder?: No Painful intercourse?: No Weak stream?: Yes Currently pregnant?: No Vaginal bleeding?: No  Gastrointestinal Nausea?: Yes Vomiting?: No Indigestion/heartburn?: No Diarrhea?: Yes Constipation?: No  Constitutional Fever: No Night sweats?: No Weight loss?: No Fatigue?: Yes  Skin Skin rash/lesions?: No Itching?: No  Eyes Blurred vision?: No Double vision?: No  Ears/Nose/Throat Sore throat?: No Sinus problems?: Yes  Hematologic/Lymphatic Swollen glands?: No Easy bruising?: No  Cardiovascular Leg swelling?: Yes Chest pain?: No  Respiratory Cough?: Yes Shortness of breath?: Yes  Endocrine Excessive thirst?: Yes  Musculoskeletal Back pain?: No Joint pain?: No  Neurological Headaches?: Yes Dizziness?: Yes  Psychologic Depression?: Yes Anxiety?: Yes  Physical Exam: BP 134/72   Pulse 87   Wt 160 lb (72.6 kg)   BMI 27.46 kg/m   Constitutional: Well nourished. Alert and oriented, No acute distress. HEENT: [place] AT, moist mucus membranes. Trachea midline, no masses. Cardiovascular: No clubbing, cyanosis, or edema. Respiratory: Normal respiratory effort, no increased work of breathing. GI: Abdomen is soft, non tender, non distended, no abdominal masses. Liver and spleen not palpable.  No hernias appreciated.  Stool sample for occult testing is not indicated.   GU: No CVA tenderness.  No bladder fullness or masses.  Atrophic external genitalia, normal pubic hair distribution, no lesions.  Normal urethral meatus, no lesions, no prolapse, no discharge.   No urethral masses, tenderness and/or tenderness. No bladder fullness,  tenderness or masses. Pale vagina mucosa, poor estrogen effect, no discharge, no lesions, good pelvic support, grade II cystocele. No rectocele noted.  Cervix, uterus and adnexa are surgically absent.  Anus and perineum are without rashes or lesions.   Skin: No rashes, bruises or suspicious lesions. Lymph: No cervical or inguinal adenopathy. Neurologic: Grossly intact, no focal deficits, moving all 4 extremities. Psychiatric: Normal mood and affect.  Laboratory Data: Lab Results  Component Value Date   WBC 5.9 11/09/2017   HGB 13.6 11/09/2017   HCT 41.5 11/09/2017   MCV 91.8 11/09/2017   PLT 232 11/09/2017    Lab Results  Component Value Date   CREATININE 0.94 11/09/2017    Lab Results  Component Value Date   HGBA1C 6.4 11/04/2016    Results for orders placed or performed in visit on 04/28/18  Urine Culture  Result Value Ref Range   MICRO NUMBER: [id number]    SPECIMEN QUALITY: ADEQUATE    Sample Source URINE    STATUS: FINAL    Result:      Single organism less than 10,000 CFU/mL isolated. These organisms, commonly found on external and internal genitalia, are considered colonizers. No further testing performed.  POCT Urinalysis Dipstick (Automated)  Result Value Ref Range   Color, UA pale    Clarity, UA cloudy    Glucose, UA Negative Negative   Bilirubin, UA neg    Ketones, UA neg    Spec Grav, UA 1.015 1.010 - 1.025   Blood, UA large    pH, UA 6.0 5.0 - 8.0   Protein, UA Positive (A) Negative   Urobilinogen, UA 0.2 0.2 or 1.0 E.U./dL   Nitrite, UA neg    Leukocytes, UA Large (3+) (A) Negative   Urinalysis See HPI and Epic.    I have reviewed the labs.  Assessment & Plan:    1. Gross hematuria I explained to the patient that there are a number of causes that can be associated with blood in the urine, such as stones,UTI's, damage to the urinary tract and/or cancer. AUA guidelines state that a CT urogram is the preferred imaging study to evaluate hematuria,  but with patient's history of asthma and COPD I was hesitant to risk and exposure to contrast and a possible allergic reaction.  We will have her undergo a noncontrast CT and renal ultrasound in  lieu of a contrast study as she would be a poor surgical candidate and smaller lesions that may be suspicious for cancer would be managed by surveillance Following the imaging study,  I've recommended a cystoscopy. I described how this is performed, typically in an office setting with a flexible cystoscope. We described the risks, benefits, and possible side effects, the most common of which is a minor amount of blood in the urine and/or burning which usually resolves in 24 to 48 hours.  The patient had the opportunity to ask questions which were answered. Based upon this discussion, the patient is willing to proceed. Therefore, I've ordered: a CT, RUS and cystoscopy. The patient will return following all of the above for discussion of the results.  UA 11-30 RBC's, > 30 WBC's and moderate bacteria Urine culture pending BUN + creatinine pending  2. Mixed incontinence BLADDER SCAN AMB NON-IMAGING Will reassess once hematuria work-up is completed  3. OAB As above  4. Vaginal atrophy Patient was given a sample of vaginal estrogen cream (Premarin vaginal cream) and instructed to apply 0.5mg  (pea-sized amount)  just inside the vaginal introitus with a finger-tip on Monday, Wednesday and Friday nights.  I explained to the patient that vaginally administered estrogen, which causes only a slight increase in the blood estrogen levels, have fewer contraindications and adverse systemic effects that oral HT. I have also given prescriptions for the Estrace cream and Premarin cream, so that the patient may carry them to the pharmacy to see which one of the branded creams would be most economical for her.  If she finds both medications cost prohibitive, she is instructed to call the office.  We can then call in a compounded  vaginal estrogen cream for the patient that may be more affordable.   She will follow up in three months for an exam.    Return for RUS, CT scan and cystoscopy .  Michiel Cowboy, PA-C  [facility] [address] [place], [place] [address] [phone]

## 2018-05-20 ENCOUNTER — Other Ambulatory Visit: Payer: Self-pay

## 2018-05-20 ENCOUNTER — Encounter: Payer: Self-pay | Admitting: Urology

## 2018-05-20 ENCOUNTER — Ambulatory Visit
Admission: RE | Admit: 2018-05-20 | Discharge: 2018-05-20 | Disposition: A | Discharge status: Home / Self Care | Payer: Medicare Other | Source: Ambulatory Visit | Attending: Pulmonary Disease | Admitting: Pulmonary Disease

## 2018-05-20 ENCOUNTER — Ambulatory Visit: Payer: Medicare Other | Admitting: Urology

## 2018-05-20 VITALS — BP 134/72 | HR 87 | Wt 160.0 lb

## 2018-05-20 DIAGNOSIS — N3946 Mixed incontinence: Secondary | ICD-10-CM | POA: Diagnosis not present

## 2018-05-20 DIAGNOSIS — R339 Retention of urine, unspecified: Secondary | ICD-10-CM | POA: Diagnosis not present

## 2018-05-20 DIAGNOSIS — N952 Postmenopausal atrophic vaginitis: Secondary | ICD-10-CM

## 2018-05-20 DIAGNOSIS — M25472 Effusion, left ankle: Secondary | ICD-10-CM | POA: Diagnosis present

## 2018-05-20 DIAGNOSIS — R310 Gross hematuria: Secondary | ICD-10-CM

## 2018-05-20 DIAGNOSIS — N3281 Overactive bladder: Secondary | ICD-10-CM | POA: Diagnosis not present

## 2018-05-20 DIAGNOSIS — R609 Edema, unspecified: Secondary | ICD-10-CM | POA: Insufficient documentation

## 2018-05-20 MED ORDER — ESTROGENS, CONJUGATED 0.625 MG/GM VA CREA: TOPICAL_CREAM | VAGINAL | 12 refills | Status: DC

## 2018-05-20 MED ORDER — ESTRADIOL 0.1 MG/GM VA CREA: TOPICAL_CREAM | VAGINAL | 12 refills | Status: DC

## 2018-05-20 NOTE — Patient Instructions (Signed)
I have given you two prescriptions for a vaginal estrogen cream.  Estrace and Premarin.  Please take these to your pharmacy and see which one your insurance covers.  If both are too expensive, please call the office at [phone] for an alternative.  You are given a sample of vaginal estrogen cream Premarin and instructed to apply 0.5mg (pea-sized amount)  just inside the vaginal introitus with a finger-tip on Monday, Wednesday and Friday nights,     

## 2018-05-21 ENCOUNTER — Other Ambulatory Visit: Payer: Medicare Other

## 2018-05-21 DIAGNOSIS — R310 Gross hematuria: Secondary | ICD-10-CM

## 2018-05-21 DIAGNOSIS — N952 Postmenopausal atrophic vaginitis: Secondary | ICD-10-CM

## 2018-05-21 LAB — URINALYSIS, COMPLETE
Bilirubin, UA: NEGATIVE
Glucose, UA: NEGATIVE
KETONES UA: NEGATIVE
NITRITE UA: NEGATIVE
Urobilinogen, Ur: 0.2 mg/dL (ref 0.2–1.0)
pH, UA: 6 (ref 5.0–7.5)

## 2018-05-21 LAB — MICROSCOPIC EXAMINATION

## 2018-05-24 ENCOUNTER — Encounter: Payer: Self-pay | Admitting: Pulmonary Disease

## 2018-05-24 ENCOUNTER — Encounter: Payer: Self-pay | Admitting: Urology

## 2018-05-24 ENCOUNTER — Telehealth: Payer: Self-pay | Admitting: Urology

## 2018-05-24 LAB — CULTURE, URINE COMPREHENSIVE

## 2018-05-24 MED ORDER — SULFAMETHOXAZOLE-TRIMETHOPRIM 800-160 MG PO TABS: 1.0000 | ORAL_TABLET | Freq: Two times a day (BID) | ORAL | 0 refills | Status: DC

## 2018-05-24 NOTE — Telephone Encounter (Signed)
Patient notified and will get the cream for 50.00 per year. She also has the ABX sent to pharmacy.

## 2018-05-24 NOTE — Telephone Encounter (Signed)
No.  I believe the compounded estrogen cream was about $50 as well.  The tube of the cream should last for one year.  Would she be able to get the medication if it is only yearly?

## 2018-05-24 NOTE — Telephone Encounter (Signed)
Spoke with patient and gave her the message about her abx and she wanted Carollee HerterShannon to know that the premarin and estrace cream was $50.00 at [facility] and she can not afford that. Is there anything else she can use?  Please advise the patient   Tami Skinner

## 2018-05-25 ENCOUNTER — Telehealth: Payer: Self-pay | Admitting: Urology

## 2018-05-25 ENCOUNTER — Encounter: Payer: Self-pay | Admitting: Pulmonary Disease

## 2018-05-25 NOTE — Telephone Encounter (Signed)
[employer] member services called on behalf of pt asking for modification of Rx for her Premarin cream to be sent in as .625 mg cream in order for the insurance to cover the medication. [phone] if any questions. Please advise. Thanks.

## 2018-05-25 NOTE — Telephone Encounter (Signed)
Okay to sent it the script.

## 2018-05-26 ENCOUNTER — Telehealth: Payer: Self-pay | Admitting: Urology

## 2018-05-26 NOTE — Telephone Encounter (Signed)
Called pt no answer. LM for pt informing her of the need to choose a cream. Informed the patient that she should not be using both creams just whichever cream is more cost effective.

## 2018-05-26 NOTE — Telephone Encounter (Signed)
Pt's husband went to pick up Estrace and Premarin from pharmacy yesterday.  Someone from pharmacy told pt's husband that they should not be used at the same time.  Pt said Carollee HerterShannon told her to use both every Monday, Wednesday and Friday night to insert a pea size amount just inside the vagina.  Please call pt back and let her know what she should do.  [phone].

## 2018-05-28 ENCOUNTER — Other Ambulatory Visit: Payer: Self-pay | Admitting: *Deleted

## 2018-05-28 ENCOUNTER — Encounter: Payer: Self-pay | Admitting: Pulmonary Disease

## 2018-05-31 ENCOUNTER — Ambulatory Visit: Payer: Medicare Other

## 2018-06-02 ENCOUNTER — Telehealth: Payer: Self-pay

## 2018-06-02 NOTE — Telephone Encounter (Signed)
I spoke with pt and she requested appt for 06/03/18 in afternoon with R Baity NP; this is long standing problem with the nausea and the zofran helps but effectiveness runs out prior to 8 hrs and pt wants to discuss taking more frequently than 8 hrs. Pt scheduled appt with R Baity NP  06/03/18 at 2 pm. Lorain ChildesFYI to Pamala Hurry Baity NP.

## 2018-06-02 NOTE — Telephone Encounter (Signed)
Copied from CRM [phone]. Topic: General - Other >> Jun 02, 2018  1:00 PM Gaynelle AduPoole, Shalonda wrote: Reason for CRM: patient is calling to request a appt with Nicki Reaperegina Baity, in regards to a nausea for today 06-02-18. Advise patient on availability. She requested for a call back. Please advise

## 2018-06-03 ENCOUNTER — Ambulatory Visit (INDEPENDENT_AMBULATORY_CARE_PROVIDER_SITE_OTHER): Payer: Medicare Other | Admitting: Internal Medicine

## 2018-06-03 ENCOUNTER — Encounter: Payer: Self-pay | Admitting: Internal Medicine

## 2018-06-03 VITALS — BP 126/70 | HR 79 | Temp 98.3°F | Wt 156.0 lb

## 2018-06-03 DIAGNOSIS — E119 Type 2 diabetes mellitus without complications: Secondary | ICD-10-CM | POA: Diagnosis not present

## 2018-06-03 DIAGNOSIS — R110 Nausea: Secondary | ICD-10-CM

## 2018-06-03 DIAGNOSIS — F419 Anxiety disorder, unspecified: Secondary | ICD-10-CM

## 2018-06-03 DIAGNOSIS — K219 Gastro-esophageal reflux disease without esophagitis: Secondary | ICD-10-CM

## 2018-06-03 LAB — POCT GLYCOSYLATED HEMOGLOBIN (HGB A1C): Hemoglobin A1C: 5.9 % — AB (ref 4.0–5.6)

## 2018-06-03 MED ORDER — ONDANSETRON HCL 4 MG PO TABS: 4.0000 mg | ORAL_TABLET | Freq: Three times a day (TID) | ORAL | 2 refills | Status: DC | PRN

## 2018-06-03 MED ORDER — ALPRAZOLAM ER 1 MG PO TB24: 1.0000 mg | ORAL_TABLET | Freq: Two times a day (BID) | ORAL | 0 refills | Status: DC

## 2018-06-03 NOTE — Progress Notes (Signed)
Subjective:    Patient ID: Tami Skinner, female    DOB: [birth year], 72 y.o.   MRN: [id number]  HPI  Pt presents to the clinic today with c/o persistent nausea and loss of appetite. This is a chronic issue that has been going on for years. The nausea seems worse when it comes time for her to eat. She denies vomiting, heartburn or reflux. She does have some issues with constipation. She had an upper GI in 2012 which was reviewed. She has a history of DM 2, on Metformin. However, she reports she had chronic nausea before she started Metformin. She has not had an A1C in the last 18 months. She has had a negative H Pylori in the past. She reports she has never been diagnosed with diabetic gastroparesis. She is taking Prilosec as prescribed.   She also wants to know if her Xanax can be adjusted. She feels like with her breathing getting worse and GI issues, her anxiety is getting worse. She is taking Sertraline as prescribed. She takes 1/2 Xanax in the morning, 1/2 when driving, 1 tab at lunch and 1 tab before bed. She is still having breakthrough anxiety. She denies depression, SI/HI.  Review of Systems      Past Medical History:  Diagnosis Date  . Anxiety   . Asthmatic bronchitis   . COPD (chronic obstructive pulmonary disease) (HCC)   . Depression   . Diabetes mellitus   . Diverticulitis   . Diverticulosis   . Hyperlipidemia   . Hypertension   . Osteopenia   . Sleep apnea   . Stress-induced cardiomyopathy    a. echo 09/28/2014: EF 45-50%, severe HK of mid-distal anterior, apical and mid-distal inferior walls suggestive of stress induced CM, mildly dilated LA, mildly dilated PASP, mild TR, trivial pericardial effusion    Current Outpatient Medications  Medication Sig Dispense Refill  . albuterol (PROVENTIL HFA;VENTOLIN HFA) 108 (90 Base) MCG/ACT inhaler Inhale 2 puffs into the lungs every 4 (four) hours as needed for wheezing or shortness of breath. 1 Inhaler 6  . albuterol  (PROVENTIL) (2.5 MG/3ML) 0.083% nebulizer solution Take 3 mLs (2.5 mg total) by nebulization every 6 (six) hours as needed for wheezing or shortness of breath. 75 mL 12  . ALPRAZolam (XANAX) 0.5 MG tablet TAKE ONE TABLET BY MOUTH EVERY 8 HOURS DNF 12/21/17 90 tablet 0  . arformoterol (BROVANA) 15 MCG/2ML NEBU Take 2 mLs (15 mcg total) by nebulization 2 (two) times daily. DX: COPD J44.9 360 mL 3  . azithromycin (ZITHROMAX) 250 MG tablet Take 250 mg by mouth daily.    . budesonide (PULMICORT) 0.25 MG/2ML nebulizer solution Take 2 mLs (0.25 mg total) by nebulization 2 (two) times daily. DX: COPD J44.9 360 mL 3  . Cholecalciferol (VITAMIN D-3) 1000 UNITS CAPS Take 2 capsules by mouth.     . conjugated estrogens (PREMARIN) vaginal cream Apply 0.5mg  (pea-sized amount)  just inside the vaginal introitus with a finger-tip on  Monday, Wednesday and Friday nights. 30 g 12  . estradiol (ESTRACE VAGINAL) 0.1 MG/GM vaginal cream Apply 0.5mg  (pea-sized amount)  just inside the vaginal introitus with a finger-tip on Monday, Wednesday and Friday nights. 30 g 12  . glucose blood (ONE TOUCH ULTRA TEST) test strip 1 each by Other route 2 (two) times daily. Use as instructed 100 each 5  . losartan (COZAAR) 50 MG tablet Take 1 tablet (50 mg total) by mouth daily. 90 tablet 1  . metFORMIN (GLUCOPHAGE) 500  MG tablet TAKE 1 TABLET BY MOUTH TWICE A DAY WITH A MEAL 180 tablet 0  . metoprolol succinate (TOPROL-XL) 50 MG 24 hr tablet Take 1 tablet (50 mg total) by mouth daily. Take with or immediately following a meal. 90 tablet 3  . omeprazole (PRILOSEC) 20 MG capsule TAKE 1 CAPSULE (20 MG TOTAL) DAILY BY MOUTH. 90 capsule 2  . ondansetron (ZOFRAN) 4 MG tablet TAKE 1 TABLET BY MOUTH EVERY 8 HOURS AS NEEDED 90 tablet 1  . OXYGEN-HELIUM IN Inhale 2 % into the lungs at bedtime.      . rosuvastatin (CRESTOR) 5 MG tablet Take 1 tablet (5 mg total) by mouth daily. 90 tablet 1  . sertraline (ZOLOFT) 100 MG tablet TAKE 2 TABLETS BY  MOUTH TWICE A DAY 360 tablet 0  . SPIRIVA HANDIHALER 18 MCG inhalation capsule INHALE 1 CAPSULE VIA HANDIHALER ONCE DAILY AT THE SAME TIME EVERY DAY 90 capsule 2  . sulfamethoxazole-trimethoprim (BACTRIM DS,SEPTRA DS) 800-160 MG tablet Take 1 tablet by mouth every 12 (twelve) hours. 14 tablet 0   No current facility-administered medications for this visit.     Allergies  Allergen Reactions  . Amoxicillin-Pot Clavulanate     REACTION: stomach sensitive.  Lenice Llamas [Roflumilast]     Severe nausea   . Erythromycin Base     REACTION: GI upset  . Neurontin [Gabapentin]     anxiety    Family History  Problem Relation Age of Onset  . Diabetes Daughter   . Kidney disease Daughter   . Lung cancer Mother        smoked  . COPD Mother        smoked  . COPD Father        smoked  . Prostate cancer Father   . Kidney failure Father   . Breast cancer Maternal Aunt   . Diabetes Maternal Grandmother   . Breast cancer Maternal Grandmother   . Cancer Cousin        colon    Social History   Socioeconomic History  . Marital status: Married    Spouse name: Not on file  . Number of children: Not on file  . Years of education: Not on file  . Highest education level: Not on file  Occupational History  . Not on file  Social Needs  . Financial resource strain: Not on file  . Food insecurity:    Worry: Not on file    Inability: Not on file  . Transportation needs:    Medical: Not on file    Non-medical: Not on file  Tobacco Use  . Smoking status: Former Smoker    Packs/day: 0.25    Years: 40.00    Pack years: 10.00    Types: Cigarettes    Last attempt to quit: 09/01/2016    Years since quitting: 1.7  . Smokeless tobacco: Never Used  . Tobacco comment: Uses nicotrol inhaler on occasion  Substance and Sexual Activity  . Alcohol use: No  . Drug use: No  . Sexual activity: Not Currently  Lifestyle  . Physical activity:    Days per week: Not on file    Minutes per session: Not  on file  . Stress: Not on file  Relationships  . Social connections:    Talks on phone: Not on file    Gets together: Not on file    Attends religious service: Not on file    Active member of club or organization: Not  on file    Attends meetings of clubs or organizations: Not on file    Relationship status: Not on file  . Intimate partner violence:    Fear of current or ex partner: Not on file    Emotionally abused: Not on file    Physically abused: Not on file    Forced sexual activity: Not on file  Other Topics Concern  . Not on file  Social History Narrative   Lives with husband in [place]. 2 dogs. Works at [employer].     Constitutional: Denies fever, malaise, fatigue, headache or abrupt weight changes.  Respiratory: Pt reports shortness of breath. Denies difficulty breathing, cough or sputum production.   Cardiovascular: Denies chest pain, chest tightness, palpitations or swelling in the hands or feet.  Gastrointestinal: Pt reports nausea, constipation. Denies abdominal pain, bloating, constipation, diarrhea or blood in the stool.  Psych: Pt reports anxiety. Denies depression, SI/HI.  No other specific complaints in a complete review of systems (except as listed in HPI above).  Objective:   Physical Exam  BP 126/70   Pulse 79   Temp 98.3 F (36.8 C) (Oral)   Wt 156 lb (70.8 kg)   SpO2 94%   BMI 26.78 kg/m  Wt Readings from Last 3 Encounters:  06/03/18 156 lb (70.8 kg)  05/20/18 160 lb (72.6 kg)  05/18/18 161 lb (73 kg)    General: Appears her stated age, chronically ill appearing, in NAD. Pulmonary/Chest: Normal effort and positive vesicular breath sounds. Shallow breathing but no respiratory distress. No wheezes, rales or ronchi noted.  Abdomen: Soft and nontender. Hypoactive bowel sounds. No distention or masses noted.   Neurological: Alert and oriented. Psychiatric: She is mildly anxious appearing today.     BMET    Component Value Date/Time   NA 135  11/09/2017 1525   NA 140 09/28/2014 0637   K 4.5 11/09/2017 1525   K 4.3 09/28/2014 0637   CL 98 (L) 11/09/2017 1525   CL 108 (H) 09/28/2014 0637   CO2 29 11/09/2017 1525   CO2 26 09/28/2014 0637   GLUCOSE 117 (H) 11/09/2017 1525   GLUCOSE 117 (H) 09/28/2014 0637   BUN 20 11/09/2017 1525   BUN 13 09/28/2014 0637   CREATININE 0.94 11/09/2017 1525   CREATININE 0.82 10/31/2016 1622   CALCIUM 9.7 11/09/2017 1525   CALCIUM 7.7 (L) 09/28/2014 0637   GFRNONAA 59 (L) 11/09/2017 1525   GFRNONAA >60 09/28/2014 0637   GFRAA >60 11/09/2017 1525   GFRAA >60 09/28/2014 0637    Lipid Panel     Component Value Date/Time   CHOL 156 01/20/2017 1407   CHOL 110 09/28/2014 0637   TRIG 59.0 01/20/2017 1407   TRIG 102 09/28/2014 0637   HDL 82.90 01/20/2017 1407   HDL 54 09/28/2014 0637   CHOLHDL 2 01/20/2017 1407   VLDL 11.8 01/20/2017 1407   VLDL 20 09/28/2014 0637   LDLCALC 61 01/20/2017 1407   LDLCALC 36 09/28/2014 0637    CBC    Component Value Date/Time   WBC 5.9 11/09/2017 1525   RBC 4.52 11/09/2017 1525   HGB 13.6 11/09/2017 1525   HGB 13.0 09/30/2014 0525   HCT 41.5 11/09/2017 1525   HCT 39.0 09/30/2014 0525   PLT 232 11/09/2017 1525   PLT 234 09/30/2014 0525   MCV 91.8 11/09/2017 1525   MCV 93 09/30/2014 0525   MCH 30.1 11/09/2017 1525   MCHC 32.8 11/09/2017 1525   RDW 14.8 (H) 11/09/2017  1525   RDW 14.1 09/30/2014 0525   LYMPHSABS 1.3 11/09/2017 1525   LYMPHSABS 1.3 09/30/2014 0525   MONOABS 0.5 11/09/2017 1525   MONOABS 0.5 09/30/2014 0525   EOSABS 0.1 11/09/2017 1525   EOSABS 0.2 09/30/2014 0525   BASOSABS 0.0 11/09/2017 1525   BASOSABS 0.0 09/30/2014 0525    Hgb A1C Lab Results  Component Value Date   HGBA1C 5.9 (A) 06/03/2018            Assessment & Plan:   Chronic Nausea, GERD, DM 2:  A1C 5.9%, will have her stop Metformin ? Diabetic gastroparesis She refuses imaging study to determine this Continue Omeprazole Increase Zofran to 4 mg  TID If no improvement, consider trial of Reglan  Anxiety:  Deteriorated Continue Sertraline Change Xanax 0.5 mg TID to Xanax 1 mg daily, with optional second dose in the evening  Update me in 1 month and let me know how you are doing [name], NP

## 2018-06-03 NOTE — Patient Instructions (Signed)
Nausea, Adult Feeling sick to your stomach (nausea) means that your stomach is upset or you feel like you have to throw up (vomit). Feeling sick to your stomach is usually not serious, but it may be an early sign of a more serious medical problem. As you feel sicker to your stomach, it can lead to throwing up (vomiting). If you throw up, or if you are not able to drink enough fluids, there is a risk of dehydration. Dehydration can make you feel tired and thirsty, have a dry mouth, and pee (urinate) less often. Older adults and people who have other diseases or a weak defense (immune) system have a higher risk of dehydration. The main goal of treating this condition is to:  Limit how often you feel sick to your stomach.  Prevent throwing up and dehydration.  Follow these instructions at home: Follow instructions from your doctor about how to care for yourself at home. Eating and drinking Follow these recommendations as told by your doctor:  Take an oral rehydration solution (ORS). This is a drink that is sold at pharmacies and stores.  Drink clear fluids in small amounts as you are able, such as: ? Water. ? Ice chips. ? Fruit juice that has water added (diluted fruit juice). ? Low-calorie sports drinks.  Eat bland, easy to digest foods in small amounts as you are able, such as: ? Bananas. ? Applesauce. ? Rice. ? Lean meats. ? Toast. ? Crackers.  Avoid drinking fluids that contain a lot of sugar or caffeine.  Avoid alcohol.  Avoid spicy or fatty foods.  General instructions  Drink enough fluid to keep your pee (urine) clear or pale yellow.  Wash your hands often. If you cannot use soap and water, use hand sanitizer.  Make sure that all people in your household wash their hands well and often.  Rest at home while you get better.  Take over-the-counter and prescription medicines only as told by your doctor.  Breathe slowly and deeply when you feel sick to your  stomach.  Watch your condition for any changes.  Keep all follow-up visits as told by your doctor. This is important. Contact a doctor if:  You have a headache.  You have new symptoms.  You feel sicker to your stomach.  You have a fever.  You feel light-headed or dizzy.  You throw up.  You are not able to keep fluids down. Get help right away if:  You have pain in your chest, neck, arm, or jaw.  You feel very weak or you pass out (faint).  You have throw up that is bright red or looks like coffee grounds.  You have bloody or black poop (stools), or poop that looks like tar.  You have a very bad headache, a stiff neck, or both.  You have very bad pain, cramping, or bloating in your belly.  You have a rash.  You have trouble breathing or you are breathing very quickly.  Your heart is beating very quickly.  Your skin feels cold and clammy.  You feel confused.  You have pain while peeing.  You have signs of dehydration, such as: ? Dark pee, or very little or no pee. ? Cracked lips. ? Dry mouth. ? Sunken eyes. ? Sleepiness. ? Weakness. These symptoms may be an emergency. Do not wait to see if the symptoms will go away. Get medical help right away. Call your local emergency services (911 in the [place] Do not drive yourself to   the hospital. This information is not intended to replace advice given to you by your health care provider. Make sure you discuss any questions you have with your health care provider. Document Released: 10/09/2011 Document Revised: 03/27/2016 Document Reviewed: 06/26/2015 Elsevier Interactive Patient Education  2018 Elsevier Inc.  

## 2018-06-09 ENCOUNTER — Encounter: Payer: Self-pay | Admitting: Internal Medicine

## 2018-06-09 MED ORDER — ALPRAZOLAM 0.5 MG PO TABS: 0.5000 mg | ORAL_TABLET | Freq: Three times a day (TID) | ORAL | 0 refills | Status: DC | PRN

## 2018-06-12 ENCOUNTER — Other Ambulatory Visit: Payer: Self-pay | Admitting: Internal Medicine

## 2018-06-17 ENCOUNTER — Other Ambulatory Visit: Payer: Self-pay | Admitting: Pulmonary Disease

## 2018-06-18 ENCOUNTER — Other Ambulatory Visit: Payer: Self-pay | Admitting: Cardiovascular Disease

## 2018-06-22 ENCOUNTER — Ambulatory Visit: Payer: Self-pay | Admitting: Pulmonary Disease

## 2018-06-24 ENCOUNTER — Ambulatory Visit
Admission: RE | Admit: 2018-06-24 | Discharge: 2018-06-24 | Disposition: A | Discharge status: Home / Self Care | Payer: Medicare Other | Source: Ambulatory Visit | Attending: Urology | Admitting: Urology

## 2018-06-24 DIAGNOSIS — I700 Atherosclerosis of aorta: Secondary | ICD-10-CM | POA: Diagnosis not present

## 2018-06-24 DIAGNOSIS — R310 Gross hematuria: Secondary | ICD-10-CM

## 2018-06-25 ENCOUNTER — Encounter: Payer: Self-pay | Admitting: Urology

## 2018-06-25 ENCOUNTER — Ambulatory Visit: Payer: Medicare Other | Admitting: Urology

## 2018-06-25 VITALS — BP 115/64 | HR 79 | Ht 64.0 in | Wt 156.7 lb

## 2018-06-25 DIAGNOSIS — R310 Gross hematuria: Secondary | ICD-10-CM | POA: Diagnosis not present

## 2018-06-25 LAB — URINALYSIS, COMPLETE
BILIRUBIN UA: NEGATIVE
Glucose, UA: NEGATIVE
Ketones, UA: NEGATIVE
NITRITE UA: NEGATIVE
PH UA: 6 (ref 5.0–7.5)
Protein, UA: NEGATIVE
Specific Gravity, UA: 1.025 (ref 1.005–1.030)
Urobilinogen, Ur: 0.2 mg/dL (ref 0.2–1.0)

## 2018-06-25 LAB — MICROSCOPIC EXAMINATION: RBC MICROSCOPIC, UA: NONE SEEN /HPF (ref 0–2)

## 2018-06-25 MED ORDER — CIPROFLOXACIN HCL 500 MG PO TABS
500.0000 mg | ORAL_TABLET | Freq: Once | ORAL | Status: AC
Start: 2018-06-25 — End: 2018-06-25
  Administered 2018-06-25: 500 mg via ORAL

## 2018-06-25 MED ORDER — LIDOCAINE HCL URETHRAL/MUCOSAL 2 % EX GEL
1.0000 "application " | Freq: Once | CUTANEOUS | Status: AC
  Administered 2018-06-25: 1 via URETHRAL

## 2018-06-25 NOTE — Progress Notes (Signed)
Cystoscopy Procedure Note:  Indication: Gross hematuria  After informed consent and discussion of the procedure and its risks, Tami Skinner was positioned and prepped in the standard fashion. Cystoscopy was performed with the a flexible cystoscope. The urethra, bladder neck and entire bladder was visualized in a standard fashion. The ureteral orifices were visualized in their normal location and orientation. Retroflexion showed no lesions at the bladder neck. Healthy mucosa throughout. Cytology sent.  Findings: Normal cystoscopy  Imaging:  CT A/P w/o contrast 06/24/18 personally reviewed: No hydronephrosis, urolithiasis, or masses Renal ultrasound 06/24/18 personally reviewed: No hydronephrosis or masses  Assessment and Plan: Follow up cytology, if positive will go to OR for bilateral retrograde pyelograms and possible biopsy Continue follow up with Tami PlanMcGowan, PA for OAB  Sondra ComeBrian C Emoree Sasaki, MD 06/25/2018

## 2018-06-30 ENCOUNTER — Other Ambulatory Visit: Payer: Self-pay | Admitting: Urology

## 2018-07-01 ENCOUNTER — Other Ambulatory Visit: Payer: Medicare Other | Admitting: Urology

## 2018-07-01 ENCOUNTER — Telehealth: Payer: Self-pay

## 2018-07-01 ENCOUNTER — Encounter: Payer: Self-pay | Admitting: Internal Medicine

## 2018-07-01 DIAGNOSIS — F329 Major depressive disorder, single episode, unspecified: Secondary | ICD-10-CM

## 2018-07-01 DIAGNOSIS — F419 Anxiety disorder, unspecified: Principal | ICD-10-CM

## 2018-07-01 NOTE — Telephone Encounter (Signed)
My chart message sent to pt.

## 2018-07-01 NOTE — Telephone Encounter (Signed)
-----   Message from Sondra ComeBrian C Sninsky, MD sent at 06/30/2018  3:35 PM EDT ----- Regarding: negative cytology Please let patient know her cytology was negative. [facility].  Sondra ComeBrian C Sninsky, MD 06/30/2018    ----- Message ----- From: Marlaine HindGentry, Angela D Sent: 06/30/2018   3:06 PM EDT To: Sondra ComeBrian C Sninsky, MD

## 2018-07-07 ENCOUNTER — Other Ambulatory Visit: Payer: Self-pay | Admitting: Internal Medicine

## 2018-07-08 ENCOUNTER — Other Ambulatory Visit: Payer: Self-pay

## 2018-07-08 ENCOUNTER — Encounter: Payer: Self-pay | Admitting: Pulmonary Disease

## 2018-07-08 ENCOUNTER — Ambulatory Visit: Payer: Medicare Other | Admitting: Pulmonary Disease

## 2018-07-08 VITALS — BP 120/68 | HR 72 | Ht 64.0 in | Wt 157.0 lb

## 2018-07-08 DIAGNOSIS — G4733 Obstructive sleep apnea (adult) (pediatric): Secondary | ICD-10-CM

## 2018-07-08 DIAGNOSIS — J9611 Chronic respiratory failure with hypoxia: Secondary | ICD-10-CM

## 2018-07-08 DIAGNOSIS — J449 Chronic obstructive pulmonary disease, unspecified: Secondary | ICD-10-CM

## 2018-07-08 DIAGNOSIS — F419 Anxiety disorder, unspecified: Secondary | ICD-10-CM | POA: Diagnosis not present

## 2018-07-08 DIAGNOSIS — Z7189 Other specified counseling: Secondary | ICD-10-CM

## 2018-07-08 MED ORDER — ALPRAZOLAM 0.5 MG PO TABS: 0.5000 mg | ORAL_TABLET | Freq: Three times a day (TID) | ORAL | 0 refills | Status: DC | PRN

## 2018-07-08 NOTE — Telephone Encounter (Signed)
Last filled 06/09/18, note to pharmacy [facility] on or after 07/10/18... Please advise

## 2018-07-08 NOTE — Patient Instructions (Addendum)
1) since you have had issues with the nebulizer medications we will discontinue the Brovana and the Pulmicort via nebulizer 2) you have received a sample of Trelegy Ellipta, you are to take one inhalation daily. Let us know how you are doing after the first week of therapy. We will try then to procure this for you if it works for you. Do not use Advair or Spiriva while taking the Trelegy. 3) follow up with Dr. Sung Amabile on the first week of October. These do not hesitate to contact us prior to that time should any new difficulties arise.

## 2018-07-08 NOTE — Progress Notes (Signed)
Subjective:     Patient ID: Tami Skinner, female   DOB: 1946-11-03, 72 y.o.   MRN: [id number]  HPI   Patient is a 72 year old former smoker (quit 2017) who follows with Dr. Billy Fischer. The patient has an FEV1 of 0.95 L or 44% predicted on prior pulmonary function testing of January 2018. The patient has been oxygen dependent at 2 L per minute on portable oxygen concentrator. She has chronic class III-IV dyspnea. She presents today to discuss her medications. She previously had been on Advair discus and was switched to generic Advair which she did not tolerate well. Subsequently she was switched to alformoterol and budesonide via nebulization but this has only caused her issues with tachy palpitations and increased anxiety. She has been compliant with Spiriva and as needed albuterol. She notes that her dyspnea is worse when bending over or when she is anxious and feels that she cannot catch her breath. She has not had associated chest pain. She does note issues with early satiety and nausea which has been plaguing her for well over a year. She has not had change in her cough pattern, she has no sputum production no hemoptysis. Weight has been stable.  She does note that recently she has not been able to use her CPAP as she has not had a new mask provided for her. She is concerned that her CPAP will be discontinued due to noncompliance. A new mask is being procured for her. She does note that she does better when she is able to use her CPAP nocturnally.  She also wanted to discuss today and of life issues. She stresses that she does not ever want to be on mechanical ventilation. We did discuss palliative care and she is open to this concept. I have recommended that she upgrade her advanced directives so that her wishes can be appropriately carried out.   Review of Systems  Constitutional: Positive for fatigue. Negative for appetite change, chills, fever and unexpected weight change.  HENT: Negative  for congestion, mouth sores, postnasal drip, sinus pain, sore throat and trouble swallowing.   Eyes: Negative.   Respiratory: Positive for cough (Nonproductive, no change from baseline) and shortness of breath. Negative for chest tightness.   Cardiovascular: Positive for palpitations (When using alformoterol). Negative for chest pain and leg swelling.  Gastrointestinal: Positive for nausea.       Feeling of fullness  Endocrine: Negative.   Genitourinary: Positive for dysuria and frequency. Negative for difficulty urinating.  Musculoskeletal: Negative.   Skin: Negative.   Neurological: Negative.   Psychiatric/Behavioral: The patient is nervous/anxious.        Objective:   Physical Exam  Constitutional: She is oriented to person, place, and time. She appears well-developed and well-nourished.  HENT:  Head: Normocephalic and atraumatic.  Mouth/Throat: Oropharynx is clear and moist. No oropharyngeal exudate.  Eyes: Pupils are equal, round, and reactive to light. No scleral icterus.  Neck: Neck supple. No JVD present. No tracheal deviation present. No thyromegaly present.  Cardiovascular: Normal rate, regular rhythm and normal heart sounds. Exam reveals no gallop and no friction rub.  No murmur heard. Pulmonary/Chest: No stridor. No respiratory distress. She has no wheezes. She has no rales.  Chronic use of accessories. Comfortable on 2 L nasal cannula 02. Distant breath sounds.  Abdominal: Soft. She exhibits no distension.  Musculoskeletal: She exhibits no edema or deformity.  Lymphadenopathy:    She has no cervical adenopathy.  Neurological: She is alert and oriented  to person, place, and time.  Skin: Skin is warm and dry.  Psychiatric: She has a normal mood and affect. Thought content normal.       Assessment     1) Stage IV COPD by GOLD classification she is having difficulties using Brovana and Pulmicort due to increasing issues with anxiety and palpitations particularly when  taking Brovana. Though she finds Advair and Spiriva somewhat effective, she would like to know if there is something that's "better".  2) Chronic respiratory failure with hypoxia, well compensated on 2 L per minute via nasal cannula at intermittent flow.  3) OSA and COPD overlap syndrome, the patient has not been able to comply with CPAP therapy due to need for a new mask.  4) Anxiety disorder, unspecified, the patient is currently scheduled to see a counselor in this regard.  5) Do not resuscitate discussion      Plan:      1) she has been given a sample and instructions on the use of Trelegy Ellipta one inhalation daily. She was instructed to rinse her mouth well after use. She is not to use Advair or Spiriva while taking the Trelegy. Brovana and Pulmicort have been discontinued due to poor patient tolerance.  2) She will continue oxygen at 2 L per minute via nasal cannula, intermittent flow delivered by portable concentrator She has been compliant with oxygen therapy.  3) Patient states that she has not been able to use her CPAP due to not having the appropriate mask. It is my understanding that this has been ordered for her.  4) The patient is to see a counselor with regards to her anxiety disorder issues.  5) I do not resuscitate discussion this performed during this visit. The patient is to update her advanced directives in this regard. She is contemplating palliative care.  Discussed flu vaccine, patient will obtain through her primary care physician she would like to hold off today.

## 2018-07-15 ENCOUNTER — Telehealth: Payer: Self-pay | Admitting: Pulmonary Disease

## 2018-07-15 MED ORDER — FLUTICASONE-UMECLIDIN-VILANT 100-62.5-25 MCG/INH IN AEPB: 1.0000 | INHALATION_SPRAY | Freq: Every day | RESPIRATORY_TRACT | 2 refills | Status: DC

## 2018-07-15 NOTE — Telephone Encounter (Signed)
Trelegy has been sent to pt's pharmacy. Nothing further needed.

## 2018-07-15 NOTE — Telephone Encounter (Signed)
Patient calling stating she would like a prescription sent in Trelegy  She states the sample she used really worked   Please send to [facility] in [place]

## 2018-07-22 ENCOUNTER — Ambulatory Visit (INDEPENDENT_AMBULATORY_CARE_PROVIDER_SITE_OTHER): Payer: Medicare Other | Admitting: Psychology

## 2018-07-22 DIAGNOSIS — F3289 Other specified depressive episodes: Secondary | ICD-10-CM

## 2018-07-29 ENCOUNTER — Ambulatory Visit: Payer: Medicare Other | Admitting: Psychology

## 2018-07-30 ENCOUNTER — Ambulatory Visit: Payer: Self-pay | Admitting: Cardiovascular Disease

## 2018-08-04 ENCOUNTER — Ambulatory Visit: Payer: Medicare Other | Admitting: Pulmonary Disease

## 2018-08-04 ENCOUNTER — Encounter: Payer: Self-pay | Admitting: Pulmonary Disease

## 2018-08-04 VITALS — BP 108/70 | HR 73 | Resp 16 | Ht 64.0 in | Wt 158.0 lb

## 2018-08-04 DIAGNOSIS — F419 Anxiety disorder, unspecified: Secondary | ICD-10-CM | POA: Diagnosis not present

## 2018-08-04 DIAGNOSIS — J9611 Chronic respiratory failure with hypoxia: Secondary | ICD-10-CM | POA: Diagnosis not present

## 2018-08-04 DIAGNOSIS — J449 Chronic obstructive pulmonary disease, unspecified: Secondary | ICD-10-CM

## 2018-08-04 DIAGNOSIS — G4733 Obstructive sleep apnea (adult) (pediatric): Secondary | ICD-10-CM

## 2018-08-04 DIAGNOSIS — G4700 Insomnia, unspecified: Secondary | ICD-10-CM | POA: Diagnosis not present

## 2018-08-04 MED ORDER — ALPRAZOLAM 0.5 MG PO TABS: 0.5000 mg | ORAL_TABLET | Freq: Three times a day (TID) | ORAL | 0 refills | Status: DC | PRN

## 2018-08-04 MED ORDER — TRAZODONE HCL 50 MG PO TABS: 50.0000 mg | ORAL_TABLET | Freq: Every day | ORAL | 5 refills | Status: DC

## 2018-08-04 NOTE — Patient Instructions (Addendum)
1) continue Trelegy inhaler daily 2) continue albuterol (inhaler or nebulizer) as needed for shortness of breath 3) continue oxygen therapy as close to 24 hours/day as possible 4) discontinue CPAP.  We will contact the company 5) one-time prescription for Xanax has been placed.  Further prescription of this medication will need to be through your primary care physician 6) trazodone 50 mg nightly to be taken 1 hour before bedtime 7) referral to palliative care services 8) follow-up in 6 to 8 weeks

## 2018-08-05 ENCOUNTER — Telehealth: Payer: Self-pay | Admitting: Pulmonary Disease

## 2018-08-05 ENCOUNTER — Ambulatory Visit: Payer: Medicare Other | Admitting: Psychology

## 2018-08-05 DIAGNOSIS — F3289 Other specified depressive episodes: Secondary | ICD-10-CM

## 2018-08-05 NOTE — Telephone Encounter (Signed)
Spoke to [employer] at [facility], will add date and refax.

## 2018-08-05 NOTE — Progress Notes (Signed)
PULMONARY OFFICE FOLLOW UP NOTE  Requesting MD/Service: Self Date of initial consultation: 09/29/16 Reason for consultation: COPD, smoker  PT PROFILE: 1 F former smoker (quit October 2017) previously followed by Dr Kendrick Fries for lung nodule and moderate COPD. Maintained on Spiriva, Advair, PRN albuterol with class III dyspnea  PROBLEMS: Chronic hypoxemic respiratory failure Very severe COPD Obstructive sleep apnea  DATA: Spirometry 02/03/12: Moderate obstruction (FEV1 1.21 liters, 49% pred) CT chest 09/27/14: Stable right lower lobe pulmonary nodule over over 2 year interval consistent benign etiology PFTs 11/11/16: mod-severe obstruction (FEV1 0.95 liters, 44% pred), mild restriction, severe reduction in DLCO. Lung volumes are probably underestimated by inadequate test performance CXR 11/28/16: hyperinflation. NACPD PFTs 09/17/17: Moderate obstruction (FEV1 1.20 L, 52% predicted.  Lung volumes normal.  DLCO markedly reduced at 43% predicted.  Borderline improvement with bronchodilator therapy  INTERVAL: Last seen by me 05/18/18.  Seen by Dr. Jayme Cloud 07/08/2018.  Noted to be intolerant to nebulized budesonide and arformoterol.  Changed from Advair plus Spiriva to Trelegy inhaler.  Goals of care/advanced directives discussion was undertaken.  SUBJ: This is a scheduled reevaluation.  Her recent encounter with Dr. Jayme Cloud has been reviewed in detail.  She has multiple complaints.  She remains acutely impaired by dyspnea.  She also has severe anxiety for which she takes Xanax up to 3 times per day.  She has episodes that she describes as "panic".  Xanax was prescribed by [name], NP but she does not get enough to get her from one prescription to the next.  She is intolerant of CPAP as it creates a smothering sensation.  She also reports insomnia, often unable to fall asleep until 2 or 3 in the morning.  She then ends up sleeping until 11 AM or noon.  Overall, she is very frustrated  with her quality of life and functional status.  She struggles with all ADLs.  Her son is now living with her to give her assistance.  She wishes to discuss further the possibility of palliative care services.  Presently, she denies CP, fever, purulent sputum, hemoptysis.   MEDICATIONS: I have reviewed all medications and confirmed regimen as documented  Vitals:   08/04/18 1207 08/04/18 1208  BP:  108/70  Pulse:  73  Resp: 16   SpO2:  91%  Weight: 158 lb (71.7 kg)   Height: 5\' 4"  (1.626 m)   2 LPM  EXAM:  Tremulous, in WC, no overt distress at rest HEENT: Sclerae white, NCAT No JVD noted Breath sounds markedly diminished throughout, no adventitious sounds Regular, no murmur Soft, NABS, NT Trace symmetric ankle edema No focal neurologic deficits  DATA:   BMP Latest Ref Rng & Units 11/09/2017 10/31/2016 05/20/2016  Glucose 65 - 99 mg/dL 086(V) 784(O) 962(X)  BUN 6 - 20 mg/dL 20 14 16   Creatinine 0.44 - 1.00 mg/dL 5.28 4.13 2.44  Sodium 135 - 145 mmol/L 135 138 138  Potassium 3.5 - 5.1 mmol/L 4.5 4.2 4.3  Chloride 101 - 111 mmol/L 98(L) 99 102  CO2 22 - 32 mmol/L 29 23 31   Calcium 8.9 - 10.3 mg/dL 9.7 01.0 27.2    CBC Latest Ref Rng & Units 11/09/2017 10/31/2016 05/20/2016  WBC 3.6 - 11.0 K/uL 5.9 6.3 6.5  Hemoglobin 12.0 - 16.0 g/dL 53.6 64.4 03.4  Hematocrit 35.0 - 47.0 % 41.5 41.5 41.5  Platelets 150 - 440 K/uL 232 292 201.0    CXR: No new film  IMPRESSION:   COPD, very severe (HCC) -  Plan: Ambulatory referral to Hospice  Chronic respiratory failure with hypoxia (HCC) - Plan: Ambulatory referral to Hospice  Anxiety  Insomnia, unspecified type  Obstructive sleep apnea - Plan: Ambulatory Referral for DME   Completely intolerant to CPAP   PLAN:  1) continue Trelegy inhaler daily 2) continue albuterol (inhaler or nebulizer) as needed for shortness of breath 3) continue oxygen therapy as close to 24 hours/day as possible 4) discontinue CPAP.  We will contact the  company to discontinue 5) one-time prescription for Xanax has been placed.  Further prescription of this medication will need to be through her primary care provider 6) trazodone 50 mg nightly to be taken 1 hour before bedtime 7) referral to palliative care services 8) follow-up in 6 to 8 weeks  Tami Fischer, MD PCCM service Mobile [phone] Pager [phone] 08/05/2018 10:47 AM

## 2018-08-05 NOTE — Telephone Encounter (Signed)
Please call regarding pallative orders faxed over, needs to be dated.

## 2018-08-09 ENCOUNTER — Telehealth: Payer: Self-pay | Admitting: Internal Medicine

## 2018-08-09 NOTE — Telephone Encounter (Signed)
Copied from CRM [id number]. Topic: Quick Communication - See Telephone Encounter >> Aug 09, 2018  4:19 PM Maia Petties wrote: CRM for notification. See Telephone encounter for: 08/09/18.  1. Pt has history of UTIs. Currently has frequncy, urgency, and burning with urination. LaToya left a specimen cup and is asking if ok for pts husband to bring in sample for UA? 2. Requesting that order be sent to [facility] agency of offices preference for [facility] SN evaluation for assistance with showering and dressing and ADL including HH aide.

## 2018-08-09 NOTE — Telephone Encounter (Signed)
Per Kristie's note; Berton Bon, NP with Palliative Care [phone]  Maia Petties

## 2018-08-10 NOTE — Telephone Encounter (Signed)
She would need an appt to document why she needs skilled nursing with home health aid, it can't just be ordered. Also needs to been seen for urinalysis, can't just drop off sample. 30 minute appt.

## 2018-08-10 NOTE — Telephone Encounter (Signed)
Left message on voicemail.

## 2018-08-12 ENCOUNTER — Other Ambulatory Visit: Payer: Self-pay | Admitting: Internal Medicine

## 2018-08-12 NOTE — Telephone Encounter (Signed)
Last filled 07/14/2018... Rx from Pulm printed 08/04/2018, [facility] registry shows pt has not filled medication  Pt states she has the Rx at home and was only supposed to be PRN--if she needed it before next refill from you.Marland KitchenMarland Kitchen

## 2018-08-12 NOTE — Telephone Encounter (Signed)
Pt is aware and reports Sx have improved

## 2018-08-14 ENCOUNTER — Other Ambulatory Visit: Payer: Self-pay | Admitting: Internal Medicine

## 2018-08-15 ENCOUNTER — Other Ambulatory Visit: Payer: Self-pay | Admitting: Internal Medicine

## 2018-08-17 ENCOUNTER — Telehealth: Payer: Self-pay | Admitting: Pulmonary Disease

## 2018-08-17 NOTE — Telephone Encounter (Signed)
Tami Skinner states that she took Trazodone for several days and stopped d/t nausea. She is worried that she might have some sort of withdrawal symptoms. She only took Trazodone for several days (2-4 days) and it is doubtful that she will have withdrawal symptoms after this short duration of therapy.. She says she needs Xanax. I referred her to her PCP for Xanax refills. Alternatively, she can call the PCCM office in the AM for further advice.

## 2018-08-19 ENCOUNTER — Ambulatory Visit: Payer: Medicare Other | Admitting: Psychology

## 2018-08-19 DIAGNOSIS — F418 Other specified anxiety disorders: Secondary | ICD-10-CM | POA: Diagnosis not present

## 2018-08-23 ENCOUNTER — Ambulatory Visit: Payer: Medicare Other | Admitting: Internal Medicine

## 2018-08-26 ENCOUNTER — Ambulatory Visit: Payer: Medicare Other | Admitting: Internal Medicine

## 2018-08-26 ENCOUNTER — Other Ambulatory Visit: Payer: Self-pay | Admitting: Internal Medicine

## 2018-08-26 DIAGNOSIS — R110 Nausea: Secondary | ICD-10-CM

## 2018-08-26 DIAGNOSIS — E119 Type 2 diabetes mellitus without complications: Secondary | ICD-10-CM

## 2018-08-29 ENCOUNTER — Other Ambulatory Visit: Payer: Self-pay | Admitting: Pulmonary Disease

## 2018-08-31 ENCOUNTER — Ambulatory Visit: Payer: Medicare Other | Admitting: Internal Medicine

## 2018-08-31 ENCOUNTER — Other Ambulatory Visit: Payer: Self-pay | Admitting: Internal Medicine

## 2018-08-31 ENCOUNTER — Encounter: Payer: Self-pay | Admitting: Internal Medicine

## 2018-08-31 VITALS — BP 116/74 | HR 63 | Temp 98.1°F | Wt 159.0 lb

## 2018-08-31 DIAGNOSIS — I5032 Chronic diastolic (congestive) heart failure: Secondary | ICD-10-CM

## 2018-08-31 DIAGNOSIS — R3915 Urgency of urination: Secondary | ICD-10-CM

## 2018-08-31 DIAGNOSIS — F329 Major depressive disorder, single episode, unspecified: Secondary | ICD-10-CM

## 2018-08-31 DIAGNOSIS — R350 Frequency of micturition: Secondary | ICD-10-CM

## 2018-08-31 DIAGNOSIS — J9611 Chronic respiratory failure with hypoxia: Secondary | ICD-10-CM

## 2018-08-31 DIAGNOSIS — I422 Other hypertrophic cardiomyopathy: Secondary | ICD-10-CM

## 2018-08-31 DIAGNOSIS — F419 Anxiety disorder, unspecified: Secondary | ICD-10-CM

## 2018-08-31 DIAGNOSIS — R300 Dysuria: Secondary | ICD-10-CM

## 2018-08-31 DIAGNOSIS — R531 Weakness: Secondary | ICD-10-CM

## 2018-08-31 DIAGNOSIS — Z23 Encounter for immunization: Secondary | ICD-10-CM | POA: Diagnosis not present

## 2018-08-31 DIAGNOSIS — J449 Chronic obstructive pulmonary disease, unspecified: Secondary | ICD-10-CM

## 2018-08-31 MED ORDER — BUPROPION HCL ER (XL) 150 MG PO TB24: 150.0000 mg | ORAL_TABLET | Freq: Every day | ORAL | 5 refills | Status: DC

## 2018-08-31 MED ORDER — GLUCOSE BLOOD VI STRP: 1.0000 | ORAL_STRIP | Freq: Every day | 5 refills | Status: DC | PRN

## 2018-08-31 MED ORDER — FUROSEMIDE 20 MG PO TABS: 20.0000 mg | ORAL_TABLET | Freq: Every day | ORAL | 5 refills | Status: DC

## 2018-08-31 NOTE — Progress Notes (Signed)
Subjective:    Patient ID: Tami Skinner, female    DOB: [birth year], 72 y.o.   MRN: [id number]  HPI  Pt presents to the clinic today with c/o urinary urgency, frequency and dysuria. She reports this started 2-3 weeks ago. She pushed fluids and it has improved. She denies fever, chills, nausea or low back pain.   She is also requesting referral for home health. Because of her severe cardiomyopathy, COPD, she needs assistance with bathing, dressing, meal preparation. She is on continuous oxygen, walks with a rolling walker.  She also reports swelling in her feet. She noticed this 1-2 weeks ago. She denies pain, redness or warmth, but reports her skin feels tight. It is not disrupting her gait. She had an echo on 2018 which showed grade 1 diastolic dysfunction. She has chronic shortness of breath secondary to COPD and is on continuous oxygen. She has not tried anything OTC for this.  She also reports worsening depression. She feels like this is being triggered by her failing health. She is taking Zoloft 100 mg BID and Xanax 3-4 times per day. She was put on Trazadone by her pulmonologist, but reports she was unable to tolerate this medication. She would like to know if there is anything else available to help her mood. She denies SI/HI.  Review of Systems      Past Medical History:  Diagnosis Date  . Anxiety   . Asthmatic bronchitis   . COPD (chronic obstructive pulmonary disease) (HCC)   . Depression   . Diabetes mellitus   . Diverticulitis   . Diverticulosis   . Hyperlipidemia   . Hypertension   . Osteopenia   . Sleep apnea   . Stress-induced cardiomyopathy    a. echo 09/28/2014: EF 45-50%, severe HK of mid-distal anterior, apical and mid-distal inferior walls suggestive of stress induced CM, mildly dilated LA, mildly dilated PASP, mild TR, trivial pericardial effusion    Current Outpatient Medications  Medication Sig Dispense Refill  . albuterol (PROVENTIL HFA;VENTOLIN HFA)  108 (90 Base) MCG/ACT inhaler Inhale 2 puffs into the lungs every 4 (four) hours as needed for wheezing or shortness of breath. 1 Inhaler 6  . albuterol (PROVENTIL) (2.5 MG/3ML) 0.083% nebulizer solution Take 3 mLs (2.5 mg total) by nebulization every 6 (six) hours as needed for wheezing or shortness of breath. 75 mL 12  . ALPRAZolam (XANAX) 0.5 MG tablet Take 1 tablet (0.5 mg total) by mouth 3 (three) times daily as needed for sleep or anxiety. 30 tablet 0  . ALPRAZolam (XANAX) 0.5 MG tablet TAKE 1 TABLET (0.5 MG TOTAL) BY MOUTH 3 (THREE) TIMES DAILY AS NEEDED FOR ANXIETY. 90 tablet 0  . Cholecalciferol (VITAMIN D-3) 1000 UNITS CAPS Take 2 capsules by mouth.     . conjugated estrogens (PREMARIN) vaginal cream Apply 0.5mg  (pea-sized amount)  just inside the vaginal introitus with a finger-tip on  Monday, Wednesday and Friday nights. 30 g 12  . Fluticasone-Umeclidin-Vilant (TRELEGY ELLIPTA) 100-62.5-25 MCG/INH AEPB Inhale 1 puff into the lungs daily. 60 each 2  . glucose blood (ONE TOUCH ULTRA TEST) test strip 1 each by Other route 2 (two) times daily. Use as instructed 100 each 5  . losartan (COZAAR) 50 MG tablet Take 1 tablet (50 mg total) by mouth daily. 90 tablet 1  . metFORMIN (GLUCOPHAGE) 500 MG tablet TAKE 1 TABLET BY MOUTH TWICE A DAY WITH A MEAL 180 tablet 0  . metoprolol succinate (TOPROL-XL) 50 MG 24 hr tablet  TAKE 1 TABLET (50 MG TOTAL) BY MOUTH DAILY. TAKE WITH OR IMMEDIATELY FOLLOWING A MEAL. 90 tablet 0  . omeprazole (PRILOSEC) 20 MG capsule TAKE 1 CAPSULE (20 MG TOTAL) DAILY BY MOUTH. 90 capsule 0  . ondansetron (ZOFRAN) 4 MG tablet Take 1 tablet (4 mg total) by mouth every 8 (eight) hours as needed. 90 tablet 2  . OXYGEN-HELIUM IN Inhale 2 % into the lungs at bedtime.      . rosuvastatin (CRESTOR) 5 MG tablet TAKE 1 TABLET BY MOUTH EVERY DAY 90 tablet 0  . sertraline (ZOLOFT) 100 MG tablet TAKE 2 TABLETS BY MOUTH TWICE A DAY 360 tablet 0  . traZODone (DESYREL) 50 MG tablet TAKE 1  TABLET BY MOUTH EVERYDAY AT BEDTIME 90 tablet 2   No current facility-administered medications for this visit.     Allergies  Allergen Reactions  . Amoxicillin-Pot Clavulanate     REACTION: stomach sensitive.  Lenice Llamas [Roflumilast]     Severe nausea   . Erythromycin Base     REACTION: GI upset  . Neurontin [Gabapentin]     anxiety    Family History  Problem Relation Age of Onset  . Diabetes Daughter   . Kidney disease Daughter   . Lung cancer Mother        smoked  . COPD Mother        smoked  . COPD Father        smoked  . Prostate cancer Father   . Kidney failure Father   . Breast cancer Maternal Aunt   . Diabetes Maternal Grandmother   . Breast cancer Maternal Grandmother   . Cancer Cousin        colon    Social History   Socioeconomic History  . Marital status: Married    Spouse name: Not on file  . Number of children: Not on file  . Years of education: Not on file  . Highest education level: Not on file  Occupational History  . Not on file  Social Needs  . Financial resource strain: Not on file  . Food insecurity:    Worry: Not on file    Inability: Not on file  . Transportation needs:    Medical: Not on file    Non-medical: Not on file  Tobacco Use  . Smoking status: Former Smoker    Packs/day: 0.25    Years: 40.00    Pack years: 10.00    Types: Cigarettes    Last attempt to quit: 09/01/2016    Years since quitting: 1.9  . Smokeless tobacco: Never Used  . Tobacco comment: Uses nicotrol inhaler on occasion  Substance and Sexual Activity  . Alcohol use: No  . Drug use: No  . Sexual activity: Not Currently  Lifestyle  . Physical activity:    Days per week: Not on file    Minutes per session: Not on file  . Stress: Not on file  Relationships  . Social connections:    Talks on phone: Not on file    Gets together: Not on file    Attends religious service: Not on file    Active member of club or organization: Not on file    Attends  meetings of clubs or organizations: Not on file    Relationship status: Not on file  . Intimate partner violence:    Fear of current or ex partner: Not on file    Emotionally abused: Not on file    Physically  abused: Not on file    Forced sexual activity: Not on file  Other Topics Concern  . Not on file  Social History Narrative   Lives with husband in [place]. 2 dogs. Works at [employer].     Constitutional: Pt reports fatigue. Denies fever, malaise, headache or abrupt weight changes.  HEENT: Denies eye pain, eye redness, ear pain, ringing in the ears, wax buildup, runny nose, nasal congestion, bloody nose, or sore throat. Respiratory: Pt reports chronic shortness of breath. Denies difficulty breathing, cough or sputum production.   Cardiovascular: Pt reports BLE edema. Denies chest pain, chest tightness, palpitations or swelling in the hands.  Gastrointestinal: Denies abdominal pain, bloating, constipation, diarrhea or blood in the stool.  GU: Pt reports urgency, frequency, and dysuria. Denies burning sensation, blood in urine, odor or discharge. Musculoskeletal: Pt reports generalized weakness, difficulty with gait. Denies decrease in range of motion, muscle pain or joint pain and swelling.  Skin: Denies redness, rashes, lesions or ulcercations.  Neurological: Pt reports problems with balance and coordination. Denies dizziness, difficulty with memory, difficulty with speech.  Psych: Pt reports anxiety and depression. Denies SI/HI.  No other specific complaints in a complete review of systems (except as listed in HPI above).  Objective:   Physical Exam BP 116/74   Pulse 63   Temp 98.1 F (36.7 C) (Oral)   Wt 159 lb (72.1 kg)   SpO2 95%   BMI 27.29 kg/m   Wt Readings from Last 3 Encounters:  08/31/18 159 lb (72.1 kg)  08/04/18 158 lb (71.7 kg)  07/08/18 157 lb (71.2 kg)    General: Appears her stated age, chronically ill appearing,in NAD. Cardiovascular: Normal rate and  rhythm. S1,S2 noted.  No murmur, rubs or gallops noted. 1+ pitting BLE edema noted. Pulmonary/Chest: Increased effort with diminished breath sounds. No respiratory distress. No wheezes, rales or ronchi noted.  Abdomen: Soft and nontender. Normal bowel sounds. No distention or masses noted. No CVA tenderness noted. Musculoskeletal: Gait slow but steady with use of rolling walker. Neurological: Alert and oriented.  Psych: Affect slight flat. She is anxious appearing at times.    BMET    Component Value Date/Time   NA 135 11/09/2017 1525   NA 140 09/28/2014 0637   K 4.5 11/09/2017 1525   K 4.3 09/28/2014 0637   CL 98 (L) 11/09/2017 1525   CL 108 (H) 09/28/2014 0637   CO2 29 11/09/2017 1525   CO2 26 09/28/2014 0637   GLUCOSE 117 (H) 11/09/2017 1525   GLUCOSE 117 (H) 09/28/2014 0637   BUN 20 11/09/2017 1525   BUN 13 09/28/2014 0637   CREATININE 0.94 11/09/2017 1525   CREATININE 0.82 10/31/2016 1622   CALCIUM 9.7 11/09/2017 1525   CALCIUM 7.7 (L) 09/28/2014 0637   GFRNONAA 59 (L) 11/09/2017 1525   GFRNONAA >60 09/28/2014 0637   GFRAA >60 11/09/2017 1525   GFRAA >60 09/28/2014 0637    Lipid Panel     Component Value Date/Time   CHOL 156 01/20/2017 1407   CHOL 110 09/28/2014 0637   TRIG 59.0 01/20/2017 1407   TRIG 102 09/28/2014 0637   HDL 82.90 01/20/2017 1407   HDL 54 09/28/2014 0637   CHOLHDL 2 01/20/2017 1407   VLDL 11.8 01/20/2017 1407   VLDL 20 09/28/2014 0637   LDLCALC 61 01/20/2017 1407   LDLCALC 36 09/28/2014 0637    CBC    Component Value Date/Time   WBC 5.9 11/09/2017 1525   RBC 4.52 11/09/2017 1525  HGB 13.6 11/09/2017 1525   HGB 13.0 09/30/2014 0525   HCT 41.5 11/09/2017 1525   HCT 39.0 09/30/2014 0525   PLT 232 11/09/2017 1525   PLT 234 09/30/2014 0525   MCV 91.8 11/09/2017 1525   MCV 93 09/30/2014 0525   MCH 30.1 11/09/2017 1525   MCHC 32.8 11/09/2017 1525   RDW 14.8 (H) 11/09/2017 1525   RDW 14.1 09/30/2014 0525   LYMPHSABS 1.3 11/09/2017  1525   LYMPHSABS 1.3 09/30/2014 0525   MONOABS 0.5 11/09/2017 1525   MONOABS 0.5 09/30/2014 0525   EOSABS 0.1 11/09/2017 1525   EOSABS 0.2 09/30/2014 0525   BASOSABS 0.0 11/09/2017 1525   BASOSABS 0.0 09/30/2014 0525    Hgb A1C Lab Results  Component Value Date   HGBA1C 5.9 (A) 06/03/2018            Assessment & Plan:   Urinary urgency, Frequency, Dysuria:  Urinalysis: trace blood Will send urine culture abx only if positive Push fluids  Generalized Weakness, Difficulty with Gait, COPD, Cardiomyopathy:  Referral placed for home health for further evaluation  CHF Diastolic:  Will trail Lasix Will plan to repeat BMET in 2 weeks  Anxiety and Depression:  Deteriorated Support offered today Continue Sertraline and Xanax Will trial Wellbutrin   Return precautions discussed. [name], NP

## 2018-08-31 NOTE — Patient Instructions (Signed)
Heart Failure °Heart failure means your heart has trouble pumping blood. This makes it hard for your body to work well. Heart failure is usually a long-term (chronic) condition. You must take good care of yourself and follow your doctor's treatment plan. °Follow these instructions at home: °· Take your heart medicine as told by your doctor. °? Do not stop taking medicine unless your doctor tells you to. °? Do not skip any dose of medicine. °? Refill your medicines before they run out. °? Take other medicines only as told by your doctor or pharmacist. °· Stay active if told by your doctor. The elderly and people with severe heart failure should talk with a doctor about physical activity. °· Eat heart-healthy foods. Choose foods that are without trans fat and are low in saturated fat, cholesterol, and salt (sodium). This includes fresh or frozen fruits and vegetables, fish, lean meats, fat-free or low-fat dairy foods, whole grains, and high-fiber foods. Lentils and dried peas and beans (legumes) are also good choices. °· Limit salt if told by your doctor. °· Cook in a healthy way. Roast, grill, broil, bake, poach, steam, or stir-fry foods. °· Limit fluids as told by your doctor. °· Weigh yourself every morning. Do this after you pee (urinate) and before you eat breakfast. Write down your weight to give to your doctor. °· Take your blood pressure and write it down if your doctor tells you to. °· Ask your doctor how to check your pulse. Check your pulse as told. °· Lose weight if told by your doctor. °· Stop smoking or chewing tobacco. Do not use gum or patches that help you quit without your doctor's approval. °· Schedule and go to doctor visits as told. °· Nonpregnant women should have no more than 1 drink a day. Men should have no more than 2 drinks a day. Talk to your doctor about drinking alcohol. °· Stop illegal drug use. °· Stay current with shots (immunizations). °· Manage your health conditions as told by your  doctor. °· Learn to manage your stress. °· Rest when you are tired. °· If it is really hot outside: °? Avoid intense activities. °? Use air conditioning or fans, or get in a cooler place. °? Avoid caffeine and alcohol. °? Wear loose-fitting, lightweight, and light-colored clothing. °· If it is really cold outside: °? Avoid intense activities. °? Layer your clothing. °? Wear mittens or gloves, a hat, and a scarf when going outside. °? Avoid alcohol. °· Learn about heart failure and get support as needed. °· Get help to maintain or improve your quality of life and your ability to care for yourself as needed. °Contact a doctor if: °· You gain weight quickly. °· You are more short of breath than usual. °· You cannot do your normal activities. °· You tire easily. °· You cough more than normal, especially with activity. °· You have any or more puffiness (swelling) in areas such as your hands, feet, ankles, or belly (abdomen). °· You cannot sleep because it is hard to breathe. °· You feel like your heart is beating fast (palpitations). °· You get dizzy or light-headed when you stand up. °Get help right away if: °· You have trouble breathing. °· There is a change in mental status, such as becoming less alert or not being able to focus. °· You have chest pain or discomfort. °· You faint. °This information is not intended to replace advice given to you by your health care provider. Make sure you   discuss any questions you have with your health care provider. °Document Released: 07/29/2008 Document Revised: 03/27/2016 Document Reviewed: 12/06/2012 °Elsevier Interactive Patient Education © 2017 [employer] ° °

## 2018-09-01 LAB — POC URINALSYSI DIPSTICK (AUTOMATED)
BILIRUBIN UA: NEGATIVE
Glucose, UA: NEGATIVE
Ketones, UA: NEGATIVE
LEUKOCYTES UA: NEGATIVE
Nitrite, UA: NEGATIVE
PH UA: 6 (ref 5.0–8.0)
PROTEIN UA: NEGATIVE
Spec Grav, UA: 1.015 (ref 1.010–1.025)
Urobilinogen, UA: 0.2 E.U./dL

## 2018-09-01 LAB — URINE CULTURE
MICRO NUMBER:: 91300254
SPECIMEN QUALITY: ADEQUATE

## 2018-09-01 NOTE — Addendum Note (Signed)
Addended by: Roena Malady on: 09/01/2018 01:27 PM   Modules accepted: Orders

## 2018-09-02 ENCOUNTER — Ambulatory Visit: Payer: Medicare Other | Admitting: Psychology

## 2018-09-03 ENCOUNTER — Encounter: Payer: Self-pay | Admitting: Internal Medicine

## 2018-09-08 ENCOUNTER — Telehealth: Payer: Self-pay

## 2018-09-08 NOTE — Telephone Encounter (Signed)
Tami Skinner with [facility] requesting orders for [facility] nursing 1 x a week for 5 weeks for COPD Education and medicine education. Also request 1 visit for eval for PT & OT. Also order for home health aide 2 x a week for 5 weeks.

## 2018-09-08 NOTE — Telephone Encounter (Signed)
Ok for verbal orders as stated 

## 2018-09-09 ENCOUNTER — Ambulatory Visit: Payer: Medicare Other | Admitting: Psychology

## 2018-09-09 ENCOUNTER — Encounter: Payer: Self-pay | Admitting: Internal Medicine

## 2018-09-09 NOTE — Telephone Encounter (Signed)
Elease Hashimoto called back to ck on status of orders for [facility]. Elease Hashimoto notified as instructed and voiced understanding.

## 2018-09-13 ENCOUNTER — Telehealth: Payer: Self-pay | Admitting: Internal Medicine

## 2018-09-13 ENCOUNTER — Other Ambulatory Visit: Payer: Self-pay | Admitting: Internal Medicine

## 2018-09-13 NOTE — Telephone Encounter (Signed)
[facility]alt [facility] number [phone]  Verbal orders 2 week 2 1 week 1   and Give pt a incentive spiromtor Can leave message

## 2018-09-13 NOTE — Telephone Encounter (Signed)
Ok for verbal orders as requested 

## 2018-09-14 ENCOUNTER — Other Ambulatory Visit: Payer: Self-pay | Admitting: Internal Medicine

## 2018-09-14 DIAGNOSIS — B372 Candidiasis of skin and nail: Secondary | ICD-10-CM

## 2018-09-14 NOTE — Telephone Encounter (Signed)
Last filled 08/13/2018.... Please advise 

## 2018-09-14 NOTE — Telephone Encounter (Signed)
Left detailed msg on VM per HIPAA w/ VO as instructed

## 2018-09-15 ENCOUNTER — Telehealth: Payer: Self-pay

## 2018-09-15 NOTE — Telephone Encounter (Signed)
[facility] weeks

## 2018-09-15 NOTE — Telephone Encounter (Signed)
[facility] with [facility] left v/m [facility]

## 2018-09-15 NOTE — Telephone Encounter (Signed)
Last filled 05/2017 for [facility], please advise if okay to refill

## 2018-09-15 NOTE — Telephone Encounter (Signed)
[facility] faxed note that Lou[facility]altStacy [facility] requesting approval to go back out to see pt.  I was unable to reach Stacy by phone and left v/m requesting clarification because on 09/13/18 v/o given for PT 2 x a week for 2 weeks and 1 x a week for 1 week.

## 2018-09-15 NOTE — Telephone Encounter (Signed)
[name] did not get initial detailed message. Tami Skinner notified as instructed and voiced understanding.

## 2018-09-16 ENCOUNTER — Other Ambulatory Visit: Payer: Self-pay | Admitting: Cardiovascular Disease

## 2018-09-16 NOTE — Telephone Encounter (Signed)
Left message on voicemail w/ verbal order 

## 2018-09-17 ENCOUNTER — Encounter: Payer: Self-pay | Admitting: Urology

## 2018-09-17 ENCOUNTER — Telehealth: Payer: Self-pay | Admitting: Internal Medicine

## 2018-09-17 ENCOUNTER — Ambulatory Visit: Payer: Medicare Other | Admitting: Urology

## 2018-09-17 VITALS — BP 122/70 | HR 67 | Ht 64.0 in | Wt 157.8 lb

## 2018-09-17 DIAGNOSIS — Z87448 Personal history of other diseases of urinary system: Secondary | ICD-10-CM

## 2018-09-17 DIAGNOSIS — R8290 Unspecified abnormal findings in urine: Secondary | ICD-10-CM

## 2018-09-17 DIAGNOSIS — N3281 Overactive bladder: Secondary | ICD-10-CM | POA: Diagnosis not present

## 2018-09-17 DIAGNOSIS — N3946 Mixed incontinence: Secondary | ICD-10-CM | POA: Diagnosis not present

## 2018-09-17 DIAGNOSIS — N952 Postmenopausal atrophic vaginitis: Secondary | ICD-10-CM | POA: Diagnosis not present

## 2018-09-17 LAB — URINALYSIS, COMPLETE
Bilirubin, UA: NEGATIVE
Glucose, UA: NEGATIVE
Ketones, UA: NEGATIVE
Nitrite, UA: NEGATIVE
Specific Gravity, UA: >1.03 — ABNORMAL HIGH (ref 1.005–1.030)
Urobilinogen, Ur: 0.2 mg/dL (ref 0.2–1.0)
pH, UA: 6 (ref 5.0–7.5)

## 2018-09-17 LAB — MICROSCOPIC EXAMINATION
RBC, UA: >30 /hpf — ABNORMAL HIGH (ref 0–2)
WBC, UA: >30 /hpf — ABNORMAL HIGH (ref 0–5)

## 2018-09-17 LAB — BLADDER SCAN AMB NON-IMAGING: Scan Result: 0

## 2018-09-17 MED ORDER — MIRABEGRON ER 50 MG PO TB24: 50.0000 mg | ORAL_TABLET | Freq: Every day | ORAL | 0 refills | Status: DC

## 2018-09-17 NOTE — Progress Notes (Signed)
10:35 AM  01/16/16  Tami Skinner 1946-06-03 [id number]  Referring provider: Lorre Munroe, NP [address] [place], [place] [address]  Chief Complaint  Patient presents with  . Follow-up    3 months    HPI: Patient is 72 year old Caucasian female with a history of hematuria, mixed urinary incontinence, OAB and vaginal atrophy who presents today for follow-up with daughter, Tami Skinner.    History of hematuria Former smoker.  Non contrast CT on 06/24/2018 revealed normal adrenal glands. No renal calculi or hydronephrosis. Left-sided calcifications including on 46/2 are felt to be vascular, adjacent to the mid ureter. No hydroureter. A left pelvic calcification is positioned inferior to the bladder. No bladder stones are seen.  RUS on 06/24/2018 was negative.  Cysto on 06/25/2018 with Dr. Richardo Hanks was negative.  Urine cytology was negative.  She does not report any gross hematuria.  Mixed urinary incontinence Failed anticholinergics due to intolerance of side effects.  Had success with Myrbetriq 50 mg daily.  The patient is  experiencing urgency x 8 or more, frequency x 4-7, not restricting fluids to avoid visits to the restroom, is engaging in toilet mapping, incontinence x 8 or mroe and nocturia x 4-7.   Her BP is 122/70.   Her PVR is 0 mL   she is going to frequency, urgency, dysuria, nocturia, incontinence, intermittency and urinary hesitancy.  Her UA is positive for > 30 WBC's, > 30 RBC's and many bacteria.    OAB See above.  Vaginal atrophy Patient is applying the vaginal estrogen cream 3 nights weekly when she thinks about it.  She is not having difficulty applying the cream.  She has not had vaginal burning or itching.   Malodorous urine Patient continues to have a foul odor to her urine.  She states her husband gags when he empties her potty.  She states it smells like dog poop.    PMH: Past Medical History:  Diagnosis Date  . Anxiety   . Asthmatic bronchitis   .  COPD (chronic obstructive pulmonary disease) (HCC)   . Depression   . Diabetes mellitus   . Diverticulitis   . Diverticulosis   . Hyperlipidemia   . Hypertension   . Osteopenia   . Sleep apnea   . Stress-induced cardiomyopathy    a. echo 09/28/2014: EF 45-50%, severe HK of mid-distal anterior, apical and mid-distal inferior walls suggestive of stress induced CM, mildly dilated LA, mildly dilated PASP, mild TR, trivial pericardial effusion    Surgical History: Past Surgical History:  Procedure Laterality Date  . ABDOMINAL HYSTERECTOMY     partial  . BREAST BIOPSY Right    neg  . CARDIAC CATHETERIZATION  09/29/2014   [facility]  . NASAL SINUS SURGERY      Home Medications:  Allergies as of 09/17/2018      Reactions   Amoxicillin-pot Clavulanate    REACTION: stomach sensitive.   Daliresp [roflumilast]    Severe nausea    Erythromycin Base    REACTION: GI upset   Neurontin [gabapentin]    anxiety      Medication List        Accurate as of 09/17/18 10:35 AM. Always use your most recent med list.          albuterol 108 (90 Base) MCG/ACT inhaler Commonly known as:  PROVENTIL HFA;VENTOLIN HFA Inhale 2 puffs into the lungs every 4 (four) hours as needed for wheezing or shortness of breath.   albuterol (  2.5 MG/3ML) 0.083% nebulizer solution Commonly known as:  PROVENTIL Take 3 mLs (2.5 mg total) by nebulization every 6 (six) hours as needed for wheezing or shortness of breath.   ALPRAZolam 0.5 MG tablet Commonly known as:  XANAX Take 1 tablet (0.5 mg total) by mouth 3 (three) times daily as needed for sleep or anxiety.   AZOR PO Take by mouth.   buPROPion 150 MG 24 hr tablet Commonly known as:  WELLBUTRIN XL Take 1 tablet (150 mg total) by mouth daily.   conjugated estrogens vaginal cream Commonly known as:  PREMARIN Apply 0.5mg  (pea-sized amount)  just inside the vaginal introitus with a finger-tip on  Monday, Wednesday and Friday nights.     Fluticasone-Umeclidin-Vilant 100-62.5-25 MCG/INH Aepb Inhale 1 puff into the lungs daily.   furosemide 20 MG tablet Commonly known as:  LASIX Take 1 tablet (20 mg total) by mouth daily.   losartan 50 MG tablet Commonly known as:  COZAAR Take 1 tablet (50 mg total) by mouth daily.   metFORMIN 500 MG tablet Commonly known as:  GLUCOPHAGE TAKE 1 TABLET BY MOUTH TWICE A DAY WITH A MEAL   metoprolol succinate 50 MG 24 hr tablet Commonly known as:  TOPROL-XL TAKE 1 TABLET (50 MG TOTAL) BY MOUTH DAILY. TAKE WITH OR IMMEDIATELY FOLLOWING A MEAL.   mirabegron ER 50 MG Tb24 tablet Commonly known as:  MYRBETRIQ Take 1 tablet (50 mg total) by mouth daily.   omeprazole 20 MG capsule Commonly known as:  PRILOSEC TAKE 1 CAPSULE (20 MG TOTAL) DAILY BY MOUTH.   ondansetron 4 MG tablet Commonly known as:  ZOFRAN Take 1 tablet (4 mg total) by mouth every 8 (eight) hours as needed.   ONE TOUCH ULTRA TEST test strip Generic drug:  glucose blood 1 EACH BY OTHER ROUTE DAILY AS NEEDED FOR OTHER.   OXYGEN Inhale 2 % into the lungs at bedtime.   rosuvastatin 5 MG tablet Commonly known as:  CRESTOR TAKE 1 TABLET BY MOUTH EVERY DAY   sertraline 100 MG tablet Commonly known as:  ZOLOFT TAKE 2 TABLETS BY MOUTH TWICE A DAY   Vitamin D-3 25 MCG (1000 UT) Caps Take 2 capsules by mouth.       Allergies:  Allergies  Allergen Reactions  . Amoxicillin-Pot Clavulanate     REACTION: stomach sensitive.  Lenice Llamas [Roflumilast]     Severe nausea   . Erythromycin Base     REACTION: GI upset  . Neurontin [Gabapentin]     anxiety    Family History: Family History  Problem Relation Age of Onset  . Diabetes Daughter   . Kidney disease Daughter   . Lung cancer Mother        smoked  . COPD Mother        smoked  . COPD Father        smoked  . Prostate cancer Father   . Kidney failure Father   . Breast cancer Maternal Aunt   . Diabetes Maternal Grandmother   . Breast cancer Maternal  Grandmother   . Cancer Cousin        colon    Social History:  reports that she quit smoking about 2 years ago. Her smoking use included cigarettes. She has a 10.00 pack-year smoking history. She has never used smokeless tobacco. She reports that she does not drink alcohol or use drugs.  ROS: UROLOGY Frequent Urination?: Yes Hard to postpone urination?: Yes Burning/pain with urination?: Yes Get up  at night to urinate?: Yes Leakage of urine?: Yes Urine stream starts and stops?: Yes Trouble starting stream?: Yes Do you have to strain to urinate?: No Blood in urine?: No Urinary tract infection?: Yes Sexually transmitted disease?: No Injury to kidneys or bladder?: No Painful intercourse?: No Weak stream?: No Currently pregnant?: No Vaginal bleeding?: No Last menstrual period?: hysterectomy   Gastrointestinal Nausea?: Yes Vomiting?: No Indigestion/heartburn?: No Diarrhea?: No Constipation?: No  Constitutional Fever: No Night sweats?: No Weight loss?: No Fatigue?: Yes  Skin Skin rash/lesions?: No Itching?: Yes  Eyes Blurred vision?: No Double vision?: Yes  Ears/Nose/Throat Sore throat?: No Sinus problems?: Yes  Hematologic/Lymphatic Swollen glands?: No Easy bruising?: No  Cardiovascular Leg swelling?: Yes Chest pain?: No  Respiratory Cough?: Yes Shortness of breath?: Yes  Endocrine Excessive thirst?: No  Musculoskeletal Back pain?: No Joint pain?: No  Neurological Headaches?: Yes Dizziness?: Yes  Psychologic Depression?: Yes Anxiety?: Yes  Physical Exam: BP 122/70 (BP Location: Left Arm, Patient Position: Sitting, Cuff Size: Normal)   Pulse 67   Ht 5\' 4"  (1.626 m)   Wt 157 lb 12.8 oz (71.6 kg)   BMI 27.09 kg/m   Constitutional: Well nourished. Alert and oriented, No acute distress. HEENT: [place] AT, moist mucus membranes. Trachea midline, no masses. Cardiovascular: No clubbing, cyanosis, or edema. Respiratory: Normal respiratory effort,  no increased work of breathing. Skin: No rashes, bruises or suspicious lesions. Lymph: No cervical or inguinal adenopathy. Neurologic: Grossly intact, no focal deficits, moving all 4 extremities. Psychiatric: Normal mood and affect.  Laboratory Data: Lab Results  Component Value Date   WBC 5.9 11/09/2017   HGB 13.6 11/09/2017   HCT 41.5 11/09/2017   MCV 91.8 11/09/2017   PLT 232 11/09/2017    Lab Results  Component Value Date   CREATININE 0.94 11/09/2017    Lab Results  Component Value Date   HGBA1C 5.9 (A) 06/03/2018    Results for orders placed or performed in visit on 09/17/18  Bladder Scan (Post Void Residual) in office  Result Value Ref Range   Scan Result 0 ml    Results for orders placed or performed in visit on 09/17/18  Bladder Scan (Post Void Residual) in office  Result Value Ref Range   Scan Result 0 ml      I have reviewed the labs.  Assessment & Plan:    1. History of hematuria Hematuria work up completed in 06/2018 - Non contrast CT, RUS, cysto and cytology were negative No report of gross hematuria  UA today > 30 RBC's - will send for culture to rule out indolent infection  RTC in one year for UA ([id number])- patient to report any gross hematuria in the interim    2. Mixed incontinence Would like to retry the Myrbetriq 50 mg daily, # 28 samples given - I have advised the patient of the side effects of Myrbetriq, such as: elevation in BP, urinary retention and/or HA. BLADDER SCAN AMB NON-IMAGING RTC in 3 weeks for OAB questionnaire and PVR   3. OAB As above  4. Vaginal atrophy Continue vaginal estrogen cream 3 nights weekly She will follow up in three months for an exam.    5.  Malodorous urine Explained to the patient that this is likely due to a colonization of the urine She is to continue to increase her water intake, add cranberry juice/tablets and D- mannose to her regimen  Return in about 3 weeks (around 10/08/2018) for PVR and OAB  questionnaire.  Zara Council, PA-C  [facility] [address] [place], [place] [address] [phone]

## 2018-09-17 NOTE — Telephone Encounter (Signed)
[facility], missed a physical therapy visit with patient today.  Lelon MastSamantha needs a verbal order to make up a missed visit. Samantha's aware Rene KocherRegina will be out of the office until Tuesday.

## 2018-09-18 NOTE — Telephone Encounter (Signed)
Ok to for verbal order to make up missed visit

## 2018-09-21 ENCOUNTER — Encounter: Payer: Self-pay | Admitting: Internal Medicine

## 2018-09-21 ENCOUNTER — Other Ambulatory Visit: Payer: Self-pay

## 2018-09-21 ENCOUNTER — Ambulatory Visit: Payer: Medicare Other | Admitting: Urology

## 2018-09-21 ENCOUNTER — Other Ambulatory Visit: Payer: Self-pay | Admitting: *Deleted

## 2018-09-21 MED ORDER — ALPRAZOLAM 0.25 MG PO TABS: 0.5000 mg | ORAL_TABLET | Freq: Three times a day (TID) | ORAL | 0 refills | Status: DC | PRN

## 2018-09-21 NOTE — Telephone Encounter (Signed)
Pt requesting medication refill. Last Rx 08/04/18 by Dr Sung AmabileSimonds. Last OV 08/31/2018

## 2018-09-21 NOTE — Telephone Encounter (Signed)
Spoke to patient to let her know that Dr. Sung AmabileSimonds is out of the office and we are unable to fill rx at this time. She stated Baity, NP has filled this rx before and she will contact them. Nothing further needed at this time.

## 2018-09-21 NOTE — Addendum Note (Signed)
Addended by: Roena MaladyEVONTENNO, Kayna Suppa Y on: 09/21/2018 11:37 AM   Modules accepted: Orders

## 2018-09-21 NOTE — Telephone Encounter (Signed)
I spoke to [facility]ist at [facility] and she states that there is a national back order for 0.5mg  Xanax tablet... Please advise

## 2018-09-21 NOTE — Telephone Encounter (Signed)
This was actually filled by me on 11/13. Mel can you check on RX at pharmacy?

## 2018-09-21 NOTE — Addendum Note (Signed)
Addended by: Lorre MunroeBAITY, REGINA W on: 09/21/2018 11:39 AM   Modules accepted: Orders

## 2018-09-22 ENCOUNTER — Other Ambulatory Visit: Payer: Self-pay | Admitting: Internal Medicine

## 2018-09-22 ENCOUNTER — Encounter: Payer: Self-pay | Admitting: Internal Medicine

## 2018-09-27 ENCOUNTER — Encounter: Payer: Self-pay | Admitting: Pulmonary Disease

## 2018-09-27 ENCOUNTER — Other Ambulatory Visit: Payer: Self-pay | Admitting: *Deleted

## 2018-09-27 ENCOUNTER — Ambulatory Visit: Payer: Medicare Other | Admitting: Pulmonary Disease

## 2018-09-27 VITALS — BP 100/60 | HR 76 | Ht 64.0 in | Wt 159.0 lb

## 2018-09-27 DIAGNOSIS — J9611 Chronic respiratory failure with hypoxia: Secondary | ICD-10-CM | POA: Diagnosis not present

## 2018-09-27 DIAGNOSIS — J449 Chronic obstructive pulmonary disease, unspecified: Secondary | ICD-10-CM | POA: Diagnosis not present

## 2018-09-27 MED ORDER — BUDESONIDE 0.25 MG/2ML IN SUSP: 0.2500 mg | Freq: Two times a day (BID) | RESPIRATORY_TRACT | 10 refills | Status: DC

## 2018-09-27 MED ORDER — IPRATROPIUM-ALBUTEROL 0.5-2.5 (3) MG/3ML IN SOLN: 3.0000 mL | Freq: Three times a day (TID) | RESPIRATORY_TRACT | 10 refills | Status: DC

## 2018-09-27 NOTE — Patient Instructions (Signed)
We are going to change all respiratory medicines to nebulized form. Initiate Pulmicort (budesonide) 0.25 mg nebulized twice a day Initiate DuoNeb (albuterol-ipratropium) 3 mL nebulized 3 times a day Discontinue Trelegy once you begin the above medications  Continue albuterol either in inhaler form or nebulized as needed for increased wheezing, cough, shortness of breath, chest tightness  Continue supplemental oxygen as close to 24 hours/day as possible  Follow-up in 3 months.  Call sooner if needed

## 2018-09-28 ENCOUNTER — Telehealth: Payer: Self-pay | Admitting: *Deleted

## 2018-09-28 NOTE — Telephone Encounter (Signed)
Spoke to NambeSamantha who states pt missed 11/15 visit and she is requesting a verbal order to complete next week. Per Lelon MastSamantha, order will need to be sent to both PCP and supervising physician. pls advise

## 2018-09-28 NOTE — Telephone Encounter (Signed)
She is not a [place] pt. Not sure why I am getting this.

## 2018-09-28 NOTE — Telephone Encounter (Signed)
Ok to make up missed visit

## 2018-09-28 NOTE — Progress Notes (Signed)
PULMONARY OFFICE FOLLOW UP NOTE  Requesting MD/Service: Self Date of initial consultation: 09/29/16 Reason for consultation: COPD, smoker  PT PROFILE: [age] F former smoker (quit October 2017) previously followed by Dr Kendrick FriesMcQuaid for lung nodule and moderate COPD. Maintained on Spiriva, Advair, PRN albuterol with class III dyspnea  PROBLEMS: Chronic hypoxemic respiratory failure Very severe COPD Obstructive sleep apnea  DATA: Spirometry 02/03/12: Moderate obstruction (FEV1 1.21 liters, 49% pred) CT chest 09/27/14: Stable right lower lobe pulmonary nodule over over 2 year interval consistent benign etiology PFTs 11/11/16: mod-severe obstruction (FEV1 0.95 liters, 44% pred), mild restriction, severe reduction in DLCO. Lung volumes are probably underestimated by inadequate test performance CXR 11/28/16: hyperinflation. NACPD PFTs 09/17/17: Moderate obstruction (FEV1 1.20 L, 52% predicted.  Lung volumes normal.  DLCO markedly reduced at 43% predicted.  Borderline improvement with bronchodilator therapy  INTERVAL: Last seen by me 08/04/2018.  No major events.    SUBJ: This is a scheduled reevaluation. Last visit, we initiated palliative care services and she believes that the services have been beneficial to her.  However, she is very frustrated with her degree of limitation.  She describes herself as "tired" and "fatigued".  She also reports increased lower extremity edema.  She remains short of breath with basic ADLs.  She does note some day today and moment to moment variation.  She is compliant with oxygen wearing at that 2 LPM 24 hours/day.  She uses Trelegy inhaler.  However, she is not certain that she is able to inhale deeply enough to deliver medications to her distal airways.  She denies CP, fever, purulent sputum, hemoptysis and calf tenderness.  She has chronic lower extremity edema.   MEDICATIONS: I have reviewed all medications and confirmed regimen as documented  Vitals:   09/27/18  1347 09/27/18 1355  BP:  100/60  Pulse:  76  SpO2:  91%  Weight: 159 lb (72.1 kg)   Height: 5\' 4"  (1.626 m)   2 LPM (pulse)  EXAM:  Appears despondent, no overt respiratory distress HEENT: NCAT, sclerae white Neck supple without adenopathy Moderate kyphosis BS markedly diminished, minimal scattered wheezes Regular, no murmur Soft, NABS, NT 1+ symmetric pretibial, ankle, pedal edema No focal neurologic deficits  DATA:   BMP Latest Ref Rng & Units 11/09/2017 10/31/2016 05/20/2016  Glucose 65 - 99 mg/dL 161(W117(H) 960(A111(H) 540(J129(H)  BUN 6 - 20 mg/dL 20 14 16   Creatinine 0.44 - 1.00 mg/dL 8.110.94 9.140.82 7.820.75  Sodium 135 - 145 mmol/L 135 138 138  Potassium 3.5 - 5.1 mmol/L 4.5 4.2 4.3  Chloride 101 - 111 mmol/L 98(L) 99 102  CO2 22 - 32 mmol/L 29 23 31   Calcium 8.9 - 10.3 mg/dL 9.7 95.610.2 21.310.1    CBC Latest Ref Rng & Units 11/09/2017 10/31/2016 05/20/2016  WBC 3.6 - 11.0 K/uL 5.9 6.3 6.5  Hemoglobin 12.0 - 16.0 g/dL 08.613.6 57.813.6 46.913.9  Hematocrit 35.0 - 47.0 % 41.5 41.5 41.5  Platelets 150 - 440 K/uL 232 292 201.0    CXR: No new film  IMPRESSION:   Chronic hypoxemic respiratory failure (HCC)  COPD, very severe (HCC)   OSA, intolerant to CPAP  I am concerned that she is not obtaining maximal benefit from inhaler medications due to limited inspiratory capacity.  Therefore, we will change all of her respiratory medications to nebulized form as outlined below   PLAN:  1) initiate Pulmicort (budesonide) 0.25 mg nebulized twice daily 2) DuoNeb (albuterol-ipratropium) nebulized 3 or 4 times per day 3) she  is instructed to discontinue the Trelegy inhaler once she obtains the nebulized medications 4) she is to continue nebulized albuterol either in inhaler form and nebulized as needed for increased wheezing, cough, shortness of breath, chest tightness 5) continue supplemental oxygen as close to 24 hours/day as possible  Follow-up in 3 months, call sooner if needed  Billy Fischer, MD PCCM  service Mobile [phone] Pager [phone] 09/28/2018 1:02 PM

## 2018-09-28 NOTE — Telephone Encounter (Signed)
Okay by me to change the visit

## 2018-09-29 ENCOUNTER — Telehealth: Payer: Self-pay | Admitting: *Deleted

## 2018-09-29 ENCOUNTER — Other Ambulatory Visit: Payer: Self-pay | Admitting: Cardiovascular Disease

## 2018-09-29 ENCOUNTER — Telehealth: Payer: Self-pay | Admitting: Cardiovascular Disease

## 2018-09-29 NOTE — Telephone Encounter (Signed)
Sp[facility] to [name], [facility] who states pt c/o dysuria and foul smelling odor. Tami Skinner is requesting order for UA and culture. pls advise

## 2018-09-29 NOTE — Telephone Encounter (Signed)
Left message on voicemail.

## 2018-09-29 NOTE — Telephone Encounter (Signed)
Ok for urinalysis and culture 

## 2018-09-29 NOTE — Telephone Encounter (Signed)
Left message on voicemail VO given on VM

## 2018-09-29 NOTE — Telephone Encounter (Signed)
metoprolol succinate (TOPROL-XL) 50 MG 24 hr tablet 90 tablet 1 09/16/2018 12/15/2018   Sig - Route: TAKE 1 TABLET (50 MG TOTAL) BY MOUTH DAILY. TAKE WITH OR IMMEDIATELY FOLLOWING A MEAL. - Oral   Sent to pharmacy as: metoprolol succinate (TOPROL-XL) 50 MG 24 hr tablet   E-Prescribing Status: Receipt confirmed by pharmacy (09/16/2018 1:08 PM EST)

## 2018-09-29 NOTE — Telephone Encounter (Signed)
°*  STAT* If patient is at the pharmacy, call can be transferred to refill team.   1. Which medications need to be refilled? (please list name of each medication and dose if known)  Metoprolol succinate 50 MG 1 tablet daily    2. Which pharmacy/location (including street and city if local pharmacy) is [facility] [place]  3. Do they need a 30 day or 90 day supply? 90 day

## 2018-10-04 ENCOUNTER — Telehealth: Payer: Self-pay | Admitting: *Deleted

## 2018-10-04 NOTE — Telephone Encounter (Signed)
Ok for OT x 2 for 1 week

## 2018-10-04 NOTE — Telephone Encounter (Signed)
Tami Skinner with [facility] called requesting verbal order for OT two times this week.

## 2018-10-05 ENCOUNTER — Other Ambulatory Visit: Payer: Self-pay | Admitting: Internal Medicine

## 2018-10-05 ENCOUNTER — Ambulatory Visit (INDEPENDENT_AMBULATORY_CARE_PROVIDER_SITE_OTHER): Payer: Medicare Other | Admitting: Cardiovascular Disease

## 2018-10-05 ENCOUNTER — Other Ambulatory Visit
Admission: RE | Admit: 2018-10-05 | Discharge: 2018-10-05 | Disposition: A | Discharge status: Home / Self Care | Payer: Medicare Other | Source: Ambulatory Visit | Attending: Cardiovascular Disease | Admitting: Cardiovascular Disease

## 2018-10-05 ENCOUNTER — Encounter: Payer: Self-pay | Admitting: Cardiovascular Disease

## 2018-10-05 VITALS — BP 90/60 | Ht 64.0 in | Wt 153.0 lb

## 2018-10-05 DIAGNOSIS — I5181 Takotsubo syndrome: Secondary | ICD-10-CM | POA: Diagnosis not present

## 2018-10-05 DIAGNOSIS — I10 Essential (primary) hypertension: Secondary | ICD-10-CM | POA: Diagnosis present

## 2018-10-05 DIAGNOSIS — M79606 Pain in leg, unspecified: Secondary | ICD-10-CM

## 2018-10-05 DIAGNOSIS — E782 Mixed hyperlipidemia: Secondary | ICD-10-CM | POA: Diagnosis not present

## 2018-10-05 LAB — BASIC METABOLIC PANEL
Anion gap: 10 (ref 5–15)
BUN: 22 mg/dL (ref 8–23)
CHLORIDE: 93 mmol/L — AB (ref 98–111)
CO2: 37 mmol/L — ABNORMAL HIGH (ref 22–32)
Calcium: 9.7 mg/dL (ref 8.9–10.3)
Creatinine, Ser: 1.32 mg/dL — ABNORMAL HIGH (ref 0.44–1.00)
GFR calc Af Amer: 46 mL/min — ABNORMAL LOW (ref >60)
GFR calc non Af Amer: 40 mL/min — ABNORMAL LOW (ref >60)
GLUCOSE: 151 mg/dL — AB (ref 70–99)
POTASSIUM: 4.2 mmol/L (ref 3.5–5.1)
Sodium: 140 mmol/L (ref 135–145)

## 2018-10-05 LAB — CBC
HEMATOCRIT: 38.9 % (ref 36.0–46.0)
HEMOGLOBIN: 11.9 g/dL — AB (ref 12.0–15.0)
MCH: 28.3 pg (ref 26.0–34.0)
MCHC: 30.6 g/dL (ref 30.0–36.0)
MCV: 92.4 fL (ref 80.0–100.0)
Platelets: 258 10*3/uL (ref 150–400)
RBC: 4.21 MIL/uL (ref 3.87–5.11)
RDW: 13.3 % (ref 11.5–15.5)
WBC: 6.2 10*3/uL (ref 4.0–10.5)
nRBC: 0 % (ref 0.0–0.2)

## 2018-10-05 NOTE — Patient Instructions (Signed)
Medication Instructions:  STOP the Losartan  If you need a refill on your cardiac medications before your next appointment, please call your pharmacy.   Lab work: Your provider would like for you to return tomororw to have the following labs drawn: CBC and BMET. Please go to the [facility] entrance and check in at the front desk. You do not need an appointment.   If you have labs (blood work) drawn today and your tests are completely normal, you will receive your results only by: Marland Kitchen. MyChart Message (if you have MyChart) OR . A paper copy in the mail If you have any lab test that is abnormal or we need to change your treatment, we will call you to review the results.  Testing/Procedures: Your physician has requested that you have a lower extremity segmental doppler. This will take place at [address], the [facility] office.   Follow-Up: At [facility], you and your health needs are our priority.  As part of our continuing mission to provide you with exceptional heart care, we have created designated Provider Care Teams.  These Care Teams include your primary Cardiologist (physician) and Advanced Practice Providers (APPs -  Physician Assistants and Nurse Practitioners) who all work together to provide you with the care you need, when you need it. You will need a follow up appointment in 6 months.  Please call our office 2 months in advance to schedule this appointment.  You may see Dr. Kirke CorinArida or one of the following Advanced Practice Providers on your designated Care Team:   Nicolasa Duckinghristopher Berge, NP Eula Listenyan Dunn, PA-C . Marisue IvanJacquelyn Visser, PA-C

## 2018-10-05 NOTE — Progress Notes (Signed)
Cardiology Office Note   Date:  10/05/2018   ID:  Tami Skinner, DOB 01/13/1946, MRN [id number]  PCP:  Lorre MunroeBaity, Regina W, NP  Cardiologist:   Lorine BearsMuhammad Lurline Caver, MD   Chief Complaint  Patient presents with  . other    12 mo  follow up.SOB with exertion. "Weak legs and feet feel like I am walking on marbles." Medications reviewed verbally,      History of Present Illness: Tami Skinner is a 72 y.o. female who presents for a follow-up visit for stress-induced cardiomyopathy. She was hospitalized in November, 2015 with chest pain and shortness of breath with mildly elevated cardiac enzymes. Cardiac catheterization showed Mild nonobstructive coronary artery disease. Ejection fraction was 40-45% with wall motion abnormality suggestive of stress-induced cardiomyopathy. She was under significant stress related to her husband being alcoholic. She has known history of severe COPD and quit smoking in 2017.  Most recent echocardiogram in September 2018 showed an ejection fraction of 60 to 65% with grade 1 diastolic dysfunction and no significant valvular abnormalities. She reports continued shortness of breath with no chest pain.  She complains of significant fatigue and low stamina with low blood pressure recently at home.  She has been monitoring her blood pressure at home and she has frequent readings below 100 systolic.  She denies any obvious bleeding.  She is on small dose furosemide. She also complains of fatigue in both legs with difficulty walking due to that.  She feels that she is walking on marble.  She is not aware of peripheral arterial disease.   Past Medical History:  Diagnosis Date  . Anxiety   . Asthmatic bronchitis   . COPD (chronic obstructive pulmonary disease) (HCC)   . Depression   . Diabetes mellitus   . Diverticulitis   . Diverticulosis   . Hyperlipidemia   . Hypertension   . Osteopenia   . Sleep apnea   . Stress-induced cardiomyopathy    a. echo 09/28/2014: EF  45-50%, severe HK of mid-distal anterior, apical and mid-distal inferior walls suggestive of stress induced CM, mildly dilated LA, mildly dilated PASP, mild TR, trivial pericardial effusion    Past Surgical History:  Procedure Laterality Date  . ABDOMINAL HYSTERECTOMY     partial  . BREAST BIOPSY Right    neg  . CARDIAC CATHETERIZATION  09/29/2014   [facility]  . NASAL SINUS SURGERY       Current Outpatient Medications  Medication Sig Dispense Refill  . albuterol (PROVENTIL HFA;VENTOLIN HFA) 108 (90 Base) MCG/ACT inhaler Inhale 2 puffs into the lungs every 4 (four) hours as needed for wheezing or shortness of breath. 1 Inhaler 6  . albuterol (PROVENTIL) (2.5 MG/3ML) 0.083% nebulizer solution Take 3 mLs (2.5 mg total) by nebulization every 6 (six) hours as needed for wheezing or shortness of breath. 75 mL 12  . ALPRAZolam (XANAX) 0.25 MG tablet Take 2 tablets (0.5 mg total) by mouth 3 (three) times daily as needed for anxiety. 180 tablet 0  . budesonide (PULMICORT) 0.25 MG/2ML nebulizer solution Take 2 mLs (0.25 mg total) by nebulization 2 (two) times daily. 60 mL 10  . buPROPion (WELLBUTRIN XL) 150 MG 24 hr tablet TAKE 1 TABLET BY MOUTH EVERY DAY 90 tablet 1  . Cholecalciferol (VITAMIN D-3) 1000 UNITS CAPS Take 2 capsules by mouth.     . conjugated estrogens (PREMARIN) vaginal cream Apply 0.5mg  (pea-sized amount)  just inside the vaginal introitus with a finger-tip on  Monday, Wednesday and  Friday nights. 30 g 12  . furosemide (LASIX) 20 MG tablet Take 1 tablet (20 mg total) by mouth daily. 30 tablet 5  . ipratropium-albuterol (DUONEB) 0.5-2.5 (3) MG/3ML SOLN Take 3 mLs by nebulization 3 (three) times daily. 360 mL 10  . losartan (COZAAR) 50 MG tablet Take 1 tablet (50 mg total) by mouth daily. 90 tablet 1  . losartan (COZAAR) 50 MG tablet TAKE 1 TABLET BY MOUTH EVERY DAY 90 tablet 0  . metFORMIN (GLUCOPHAGE) 500 MG tablet TAKE 1 TABLET BY MOUTH TWICE A DAY WITH A MEAL 180 tablet 0  .  metoprolol succinate (TOPROL-XL) 50 MG 24 hr tablet TAKE 1 TABLET (50 MG TOTAL) BY MOUTH DAILY. TAKE WITH OR IMMEDIATELY FOLLOWING A MEAL. 90 tablet 1  . mirabegron ER (MYRBETRIQ) 50 MG TB24 tablet Take 1 tablet (50 mg total) by mouth daily. 28 tablet 0  . omeprazole (PRILOSEC) 20 MG capsule TAKE 1 CAPSULE (20 MG TOTAL) DAILY BY MOUTH. 90 capsule 0  . ondansetron (ZOFRAN) 4 MG tablet Take 1 tablet (4 mg total) by mouth every 8 (eight) hours as needed. 90 tablet 2  . ONE TOUCH ULTRA TEST test strip 1 EACH BY OTHER ROUTE DAILY AS NEEDED FOR OTHER. 25 each 5  . OXYGEN-HELIUM IN Inhale 2 % into the lungs at bedtime.      . rosuvastatin (CRESTOR) 5 MG tablet TAKE 1 TABLET BY MOUTH EVERY DAY 90 tablet 0  . sertraline (ZOLOFT) 100 MG tablet TAKE 2 TABLETS BY MOUTH TWICE A DAY 360 tablet 0   No current facility-administered medications for this visit.     Allergies:   Amoxicillin-pot clavulanate; Daliresp [roflumilast]; Erythromycin base; and Neurontin [gabapentin]    Social History:  The patient  reports that she quit smoking about 2 years ago. Her smoking use included cigarettes. She has a 10.00 pack-year smoking history. She has never used smokeless tobacco. She reports that she does not drink alcohol or use drugs.   Family History:  family history includes Breast cancer in her maternal aunt and maternal grandmother; COPD in her father and mother; Cancer in her cousin; Diabetes in her daughter and maternal grandmother; Kidney disease in her daughter; Kidney failure in her father; Lung cancer in her mother; Prostate cancer in her father.    ROS:  Please see the history of present illness.   Otherwise, review of systems are positive for none.   All other systems are reviewed and negative.    PHYSICAL EXAM: VS:  BP 90/60 (BP Location: Left Arm, Patient Position: Sitting, Cuff Size: Normal)   Ht 5\' 4"  (1.626 m)   Wt 153 lb (69.4 kg)   BMI 26.26 kg/m  , BMI Body mass index is 26.26 kg/m. GEN:  Well nourished, well developed, in no acute distress  HEENT: normal  Neck: no JVD, carotid bruits, or masses Cardiac: RRR; no murmurs, rubs, or gallops,no edema  Respiratory:  clear to auscultation bilaterally, normal work of breathing GI: soft, nontender, nondistended, + BS MS: no deformity or atrophy  Skin: warm and dry, no rash Neuro:  Strength and sensation are intact Psych: euthymic mood, full affect Distal pulses are mildly diminished.  EKG:  EKG is ordered today. The ekg ordered today demonstrates normal sinus rhythm with left axis deviation no significant ST or T wave changes.   Recent Labs: 11/09/2017: ALT 25; BUN 20; Creatinine, Ser 0.94; Hemoglobin 13.6; Platelets 232; Potassium 4.5; Sodium 135    Lipid Panel  Component Value Date/Time   CHOL 156 01/20/2017 1407   CHOL 110 09/28/2014 0637   TRIG 59.0 01/20/2017 1407   TRIG 102 09/28/2014 0637   HDL 82.90 01/20/2017 1407   HDL 54 09/28/2014 0637   CHOLHDL 2 01/20/2017 1407   VLDL 11.8 01/20/2017 1407   VLDL 20 09/28/2014 0637   LDLCALC 61 01/20/2017 1407   LDLCALC 36 09/28/2014 0637   LDLDIRECT 144.4 10/11/2013 1102      Wt Readings from Last 3 Encounters:  10/05/18 153 lb (69.4 kg)  09/27/18 159 lb (72.1 kg)  09/17/18 157 lb 12.8 oz (71.6 kg)         ASSESSMENT AND PLAN:  1.  History of stress-induced cardiomyopathy: most recent ejection fraction was normal in 2018.  Given low blood pressure, elected to stop losartan.  Continue Toprol.  2.  Atypical bilateral leg pain: Mildly diminished distal pulses with possible component of peripheral neuropathy but she has risk factors for peripheral arterial disease and thus I requested lower extremity arterial Doppler.  3.  Essential hypertension: Blood pressure has been running low.  I requested CBC and basic metabolic profile and stopped losartan for now.  3. Hyperlipidemia: Continue small dose rosuvastatin.  Most recent LDL was 61.   Disposition:   FU  with me in 6 months  Signed,  Lorine Bears, MD  10/05/2018 4:26 PM    [facility]

## 2018-10-06 NOTE — Telephone Encounter (Signed)
Tami Skinner contacted office back regarding verbal order. Given as requested

## 2018-10-07 ENCOUNTER — Telehealth: Payer: Self-pay | Admitting: *Deleted

## 2018-10-07 ENCOUNTER — Other Ambulatory Visit: Payer: Self-pay | Admitting: *Deleted

## 2018-10-07 ENCOUNTER — Other Ambulatory Visit: Payer: Self-pay | Admitting: Internal Medicine

## 2018-10-07 MED ORDER — CEPHALEXIN 250 MG PO CAPS: 250.0000 mg | ORAL_CAPSULE | Freq: Four times a day (QID) | ORAL | 0 refills | Status: DC

## 2018-10-07 MED ORDER — NITROFURANTOIN MONOHYD MACRO 100 MG PO CAPS: 100.0000 mg | ORAL_CAPSULE | Freq: Two times a day (BID) | ORAL | 0 refills | Status: DC

## 2018-10-07 NOTE — Telephone Encounter (Signed)
[facility] who states pt had urinalysis on 11/29 and was 3+ for leukocytes and they are wanting to know if Tami EvenerRBaity is wanting to treat her. Pt still c/o dysuria and odor. Copy of reports should have been sent to office, but I requested that she send it to [facility], in the event they have not yet been received. pls advise

## 2018-10-07 NOTE — Telephone Encounter (Signed)
Macrobid sent to [facility]

## 2018-10-07 NOTE — Progress Notes (Signed)
.  ke

## 2018-10-07 NOTE — Addendum Note (Signed)
Addended by: Lorre MunroeBAITY, Davit Vassar W on: 10/07/2018 12:29 PM   Modules accepted: Orders

## 2018-10-11 NOTE — Progress Notes (Signed)
10/12/2018  12:03 PM   Tami Skinner 30-Mar-1946 [id number]  Referring provider: Lorre Munroe, NP [address] [place], [place] [address]  Chief Complaint  Patient presents with  . Urinary Frequency   HPI: Patient is 72 year old Caucasian female presenting today for her PVR and OAB follow up. She has a history of hematuria, mixed urinary incontinence, OAB and vaginal atrophy.  Mixed urinary incontinence Failed anticholinergics due to intolerance of side effects. Shortly after beginning her Myrbetriq trial, she was started on Lasix which she believes is complicating her trial. She has been taking the samples but has not noticed any symptom improvement. She is interested in considering a pessary.  The patient is  experiencing urgency x 8 or more (unchanged), frequency x 4-7 (unchanged), has begun to restrict fluids to avoid visits to the restroom as of 10/12/2018, is engaging in toilet mapping (unchanged), incontinence x 8 or more (unchanged), and nocturia x 4-7 (unchanged).  Her PVR is 40mL today.  OAB See above.  Vaginal atrophy Patient is applying the vaginal estrogen cream 3 nights weekly when she thinks about it.  She is not having difficulty applying the cream.  She has not had vaginal burning or itching.   History of hematuria Former smoker.  Non contrast CT on 06/24/2018 revealed normal adrenal glands. No renal calculi or hydronephrosis. Left-sided calcifications including on 46/2 are felt to be vascular, adjacent to the mid ureter. No hydroureter. A left pelvic calcification is positioned inferior to the bladder. No bladder stones are seen.  RUS on 06/24/2018 was negative.  Cysto on 06/25/2018 with Dr. Richardo Hanks was negative.  Urine cytology was negative.  She does not report any gross hematuria.  PMH: Past Medical History:  Diagnosis Date  . Anxiety   . Asthmatic bronchitis   . COPD (chronic obstructive pulmonary disease) (HCC)   . Depression   . Diabetes  mellitus   . Diverticulitis   . Diverticulosis   . Hyperlipidemia   . Hypertension   . Osteopenia   . Sleep apnea   . Stress-induced cardiomyopathy    a. echo 09/28/2014: EF 45-50%, severe HK of mid-distal anterior, apical and mid-distal inferior walls suggestive of stress induced CM, mildly dilated LA, mildly dilated PASP, mild TR, trivial pericardial effusion    Surgical History: Past Surgical History:  Procedure Laterality Date  . ABDOMINAL HYSTERECTOMY     partial  . BREAST BIOPSY Right    neg  . CARDIAC CATHETERIZATION  09/29/2014   [facility]  . NASAL SINUS SURGERY     Home Medications:  Allergies as of 10/12/2018      Reactions   Amoxicillin-pot Clavulanate    REACTION: stomach sensitive.   Daliresp [roflumilast]    Severe nausea    Erythromycin Base    REACTION: GI upset   Neurontin [gabapentin]    anxiety      Medication List       Accurate as of October 12, 2018 11:59 PM. Always use your most recent med list.        albuterol 108 (90 Base) MCG/ACT inhaler Commonly known as:  PROVENTIL HFA;VENTOLIN HFA Inhale 2 puffs into the lungs every 4 (four) hours as needed for wheezing or shortness of breath.   albuterol (2.5 MG/3ML) 0.083% nebulizer solution Commonly known as:  PROVENTIL Take 3 mLs (2.5 mg total) by nebulization every 6 (six) hours as needed for wheezing or shortness of breath.   ALPRAZolam 0.25 MG tablet Commonly known as:  XANAX Take 2 tablets (0.5 mg total) by mouth 3 (three) times daily as needed for anxiety.   budesonide 0.25 MG/2ML nebulizer solution Commonly known as:  PULMICORT Take 2 mLs (0.25 mg total) by nebulization 2 (two) times daily.   buPROPion 150 MG 24 hr tablet Commonly known as:  WELLBUTRIN XL TAKE 1 TABLET BY MOUTH EVERY DAY   cephALEXin 250 MG capsule Commonly known as:  KEFLEX Take 1 capsule (250 mg total) by mouth 4 (four) times daily.   conjugated estrogens vaginal cream Commonly known as:  PREMARIN Apply 0.5mg   (pea-sized amount)  just inside the vaginal introitus with a finger-tip on  Monday, Wednesday and Friday nights.   furosemide 20 MG tablet Commonly known as:  LASIX Take 1 tablet (20 mg total) by mouth daily.   ipratropium-albuterol 0.5-2.5 (3) MG/3ML Soln Commonly known as:  DUONEB Take 3 mLs by nebulization 3 (three) times daily.   metFORMIN 500 MG tablet Commonly known as:  GLUCOPHAGE TAKE 1 TABLET BY MOUTH TWICE A DAY WITH A MEAL   metoprolol succinate 50 MG 24 hr tablet Commonly known as:  TOPROL-XL TAKE 1 TABLET (50 MG TOTAL) BY MOUTH DAILY. TAKE WITH OR IMMEDIATELY FOLLOWING A MEAL.   mirabegron ER 50 MG Tb24 tablet Commonly known as:  MYRBETRIQ Take 1 tablet (50 mg total) by mouth daily.   mirabegron ER 50 MG Tb24 tablet Commonly known as:  MYRBETRIQ Take 1 tablet (50 mg total) by mouth daily.   nitrofurantoin (macrocrystal-monohydrate) 100 MG capsule Commonly known as:  MACROBID Take 1 capsule (100 mg total) by mouth 2 (two) times daily.   omeprazole 20 MG capsule Commonly known as:  PRILOSEC TAKE 1 CAPSULE (20 MG TOTAL) DAILY BY MOUTH.   ondansetron 4 MG tablet Commonly known as:  ZOFRAN Take 1 tablet (4 mg total) by mouth every 8 (eight) hours as needed.   ONE TOUCH ULTRA TEST test strip Generic drug:  glucose blood 1 EACH BY OTHER ROUTE DAILY AS NEEDED FOR OTHER.   OXYGEN Inhale 2 % into the lungs at bedtime.   rosuvastatin 5 MG tablet Commonly known as:  CRESTOR TAKE 1 TABLET BY MOUTH EVERY DAY   sertraline 100 MG tablet Commonly known as:  ZOLOFT TAKE 2 TABLETS BY MOUTH TWICE A DAY   Vitamin D-3 25 MCG (1000 UT) Caps Take 2 capsules by mouth.       Allergies:  Allergies  Allergen Reactions  . Amoxicillin-Pot Clavulanate     REACTION: stomach sensitive.  Lenice Llamas. Daliresp [Roflumilast]     Severe nausea   . Erythromycin Base     REACTION: GI upset  . Neurontin [Gabapentin]     anxiety    Family History: Family History  Problem Relation  Age of Onset  . Diabetes Daughter   . Kidney disease Daughter   . Lung cancer Mother        smoked  . COPD Mother        smoked  . COPD Father        smoked  . Prostate cancer Father   . Kidney failure Father   . Breast cancer Maternal Aunt   . Diabetes Maternal Grandmother   . Breast cancer Maternal Grandmother   . Cancer Cousin        colon   Social History:  reports that she quit smoking about 2 years ago. Her smoking use included cigarettes. She has a 10.00 pack-year smoking history. She has never used smokeless tobacco.  She reports that she does not drink alcohol or use drugs.  ROS: UROLOGY Frequent Urination?: Yes Hard to postpone urination?: Yes Burning/pain with urination?: No Get up at night to urinate?: Yes Leakage of urine?: Yes Urine stream starts and stops?: Yes Trouble starting stream?: Yes Do you have to strain to urinate?: No Blood in urine?: No Urinary tract infection?: No Sexually transmitted disease?: No Injury to kidneys or bladder?: No Painful intercourse?: No Weak stream?: No Currently pregnant?: No Vaginal bleeding?: No Last menstrual period?: n  Gastrointestinal Nausea?: No Vomiting?: No Indigestion/heartburn?: No Diarrhea?: No Constipation?: No  Constitutional Fever: No Night sweats?: No Weight loss?: No Fatigue?: No  Skin Skin rash/lesions?: No Itching?: No  Eyes Blurred vision?: No Double vision?: No  Ears/Nose/Throat Sore throat?: No Sinus problems?: No  Hematologic/Lymphatic Swollen glands?: No Easy bruising?: No  Cardiovascular Leg swelling?: No Chest pain?: No  Respiratory Cough?: No Shortness of breath?: No  Endocrine Excessive thirst?: No  Musculoskeletal Back pain?: No Joint pain?: No  Neurological Headaches?: No Dizziness?: No  Psychologic Depression?: No Anxiety?: No  Physical Exam: BP 102/69   Pulse 73   Ht 5\' 4"  (1.626 m)   BMI 26.26 kg/m   Constitutional: Well nourished. Alert and  oriented, No acute distress. Head: Normocephalic and Atrophic. Respiratory: Nasal cannula in place. On oxygen Skin: No rashes, bruises or suspicious lesions. Neurologic: Grossly intact, no focal deficits, moving all 4 extremities. Psychiatric: Normal mood and affect.  Laboratory Data: Lab Results  Component Value Date   WBC 6.2 10/05/2018   HGB 11.9 (L) 10/05/2018   HCT 38.9 10/05/2018   MCV 92.4 10/05/2018   PLT 258 10/05/2018    Lab Results  Component Value Date   CREATININE 1.32 (H) 10/05/2018    Lab Results  Component Value Date   HGBA1C 5.9 (A) 06/03/2018    I have reviewed the labs.  Pertinent Imaging Results for BRITT, THEARD (MRN [id number]) as of 10/11/2018 11:18  Ref. Range 11/29/2015 11:20 01/16/2016 10:49 03/11/2016 14:43 09/17/2018 09:59 10/12/2018 14:03  PVR Unknown  144 0 ml 40 ml   Assessment & Plan:    1.Mixed incontinence PVR today is 40ml. Patient reports no change in symptoms on Myrbetriq. This is likely do to use in conjunction with Lasix. Continue Myrbetriq 50 mg. Advised patient not to stop taking her Lasix unless her cardiologist approves.  We discussed the pros and cons of a pessary as well as what general experiences patients have had with it. Patient would like to get fitted for pessary. Gynecology referral for pessary fitting sent. RTC in 1 month for PVR and OAB questionnaire.  2. OAB As above   3. History of hematuria Hematuria work up completed in 06/2018 - Non contrast CT, RUS, cysto and cytology were negative No report of gross hematuria      4. Vaginal atrophy Continue vaginal estrogen cream 3 nights weekly RTC in 1 month for exam  Return in about 1 month (around 11/12/2018) for OAB questionnaire, PVR and exam.  Hulan Fray   [facility] [address] [place], [place] [address] [phone]  I, Temidayo Atanda-Ogunleye , am acting as a Neurosurgeon for Harle Battiest,  PA-C  I have reviewed the above documentation for accuracy and completeness, and I agree with the above.    Michiel Cowboy, PA-C

## 2018-10-12 ENCOUNTER — Other Ambulatory Visit: Payer: Self-pay | Admitting: Cardiovascular Disease

## 2018-10-12 ENCOUNTER — Ambulatory Visit (INDEPENDENT_AMBULATORY_CARE_PROVIDER_SITE_OTHER): Payer: Medicare Other | Admitting: Urology

## 2018-10-12 VITALS — BP 102/69 | HR 73 | Ht 64.0 in

## 2018-10-12 DIAGNOSIS — N3946 Mixed incontinence: Secondary | ICD-10-CM | POA: Diagnosis not present

## 2018-10-12 DIAGNOSIS — R8290 Unspecified abnormal findings in urine: Secondary | ICD-10-CM

## 2018-10-12 DIAGNOSIS — Z87448 Personal history of other diseases of urinary system: Secondary | ICD-10-CM | POA: Diagnosis not present

## 2018-10-12 DIAGNOSIS — I739 Peripheral vascular disease, unspecified: Secondary | ICD-10-CM

## 2018-10-12 DIAGNOSIS — N952 Postmenopausal atrophic vaginitis: Secondary | ICD-10-CM | POA: Diagnosis not present

## 2018-10-12 DIAGNOSIS — N3281 Overactive bladder: Secondary | ICD-10-CM | POA: Diagnosis not present

## 2018-10-12 DIAGNOSIS — N8111 Cystocele, midline: Secondary | ICD-10-CM

## 2018-10-12 LAB — BLADDER SCAN AMB NON-IMAGING: Scan Result: 40

## 2018-10-12 MED ORDER — MIRABEGRON ER 50 MG PO TB24: 50.0000 mg | ORAL_TABLET | Freq: Every day | ORAL | 0 refills | Status: DC

## 2018-10-13 ENCOUNTER — Telehealth: Payer: Self-pay | Admitting: *Deleted

## 2018-10-13 MED ORDER — FUROSEMIDE 20 MG PO TABS: 20.0000 mg | ORAL_TABLET | ORAL | 5 refills | Status: DC | PRN

## 2018-10-13 NOTE — Telephone Encounter (Signed)
-----   Message from [place] OuchMuhammad A Arida, MD sent at 10/11/2018  4:39 PM EST ----- Inform patient that labs showed mild dehydration.  I recommend that she takes furosemide only as needed and not daily.

## 2018-10-13 NOTE — Telephone Encounter (Signed)
Left a message for the patient to call back.  

## 2018-10-13 NOTE — Telephone Encounter (Signed)
Patient calling to discuss recent testing results  ° °Please call  ° °

## 2018-10-13 NOTE — Telephone Encounter (Signed)
Patient made aware of results and verbalized understanding.  

## 2018-10-14 ENCOUNTER — Encounter: Payer: Self-pay | Admitting: Urology

## 2018-10-19 ENCOUNTER — Ambulatory Visit: Payer: Medicare Other | Admitting: Internal Medicine

## 2018-10-20 ENCOUNTER — Other Ambulatory Visit: Payer: Self-pay | Admitting: Internal Medicine

## 2018-10-20 NOTE — Telephone Encounter (Signed)
Last filled 09/21/2018.Marland Kitchen.Marland Kitchen.please advise

## 2018-10-20 NOTE — Telephone Encounter (Signed)
Alternative Requested:INSURANCE WILL ONLY PAY FOR 4 TABS PER DAY---Xanax

## 2018-10-21 ENCOUNTER — Telehealth: Payer: Self-pay | Admitting: Pulmonary Disease

## 2018-10-21 ENCOUNTER — Ambulatory Visit (INDEPENDENT_AMBULATORY_CARE_PROVIDER_SITE_OTHER): Payer: Medicare Other | Admitting: Internal Medicine

## 2018-10-21 ENCOUNTER — Encounter: Payer: Self-pay | Admitting: Internal Medicine

## 2018-10-21 DIAGNOSIS — F329 Major depressive disorder, single episode, unspecified: Secondary | ICD-10-CM | POA: Diagnosis not present

## 2018-10-21 DIAGNOSIS — F419 Anxiety disorder, unspecified: Secondary | ICD-10-CM | POA: Diagnosis not present

## 2018-10-21 MED ORDER — BUSPIRONE HCL 10 MG PO TABS: 10.0000 mg | ORAL_TABLET | Freq: Three times a day (TID) | ORAL | 2 refills | Status: DC

## 2018-10-21 NOTE — Telephone Encounter (Signed)
LM for patient to call back BJ:[id number]:medication questions.

## 2018-10-21 NOTE — Patient Instructions (Signed)

## 2018-10-21 NOTE — Assessment & Plan Note (Signed)
Deteriorated Declined increasing Xanax to 4 x day Continue Sertraline and Wellbutrin Will trial Buspar 10 mg TID Offered referral to psychiatry, pt declined

## 2018-10-21 NOTE — Progress Notes (Signed)
Subjective:    Patient ID: Tami Skinner, female    DOB: 06-12-1946, 72 y.o.   MRN: [id number]  HPI  Pt presents to the clinic today to discuss her anxiety. She reports she still has breakthrough anxiety at times. She is currently taking Sertraline 400 mg, Wellbutrin 150 mg and Xanax 0.5 mg 3 x day. She reports there are times when she feels like she could benefit from taking the Xanax 4 times day. Her triggers include her chronic health issues, and breathing problems. She denies depression. She denies SI/HI.  Review of Systems      Past Medical History:  Diagnosis Date  . Anxiety   . Asthmatic bronchitis   . COPD (chronic obstructive pulmonary disease) (HCC)   . Depression   . Diabetes mellitus   . Diverticulitis   . Diverticulosis   . Hyperlipidemia   . Hypertension   . Osteopenia   . Sleep apnea   . Stress-induced cardiomyopathy    a. echo 09/28/2014: EF 45-50%, severe HK of mid-distal anterior, apical and mid-distal inferior walls suggestive of stress induced CM, mildly dilated LA, mildly dilated PASP, mild TR, trivial pericardial effusion    Current Outpatient Medications  Medication Sig Dispense Refill  . albuterol (PROVENTIL HFA;VENTOLIN HFA) 108 (90 Base) MCG/ACT inhaler Inhale 2 puffs into the lungs every 4 (four) hours as needed for wheezing or shortness of breath. 1 Inhaler 6  . albuterol (PROVENTIL) (2.5 MG/3ML) 0.083% nebulizer solution Take 3 mLs (2.5 mg total) by nebulization every 6 (six) hours as needed for wheezing or shortness of breath. 75 mL 12  . ALPRAZolam (XANAX) 1 MG tablet Take 0.5 tablets (0.5 mg total) by mouth 3 (three) times daily. 45 tablet 0  . budesonide (PULMICORT) 0.25 MG/2ML nebulizer solution Take 2 mLs (0.25 mg total) by nebulization 2 (two) times daily. 60 mL 10  . buPROPion (WELLBUTRIN XL) 150 MG 24 hr tablet TAKE 1 TABLET BY MOUTH EVERY DAY 90 tablet 1  . cephALEXin (KEFLEX) 250 MG capsule Take 1 capsule (250 mg total) by mouth 4 (four)  times daily. 10 capsule 0  . Cholecalciferol (VITAMIN D-3) 1000 UNITS CAPS Take 2 capsules by mouth.     . conjugated estrogens (PREMARIN) vaginal cream Apply 0.5mg  (pea-sized amount)  just inside the vaginal introitus with a finger-tip on  Monday, Wednesday and Friday nights. 30 g 12  . furosemide (LASIX) 20 MG tablet Take 1 tablet (20 mg total) by mouth as needed for edema. 30 tablet 5  . ipratropium-albuterol (DUONEB) 0.5-2.5 (3) MG/3ML SOLN Take 3 mLs by nebulization 3 (three) times daily. 360 mL 10  . metFORMIN (GLUCOPHAGE) 500 MG tablet TAKE 1 TABLET BY MOUTH TWICE A DAY WITH A MEAL 180 tablet 0  . metoprolol succinate (TOPROL-XL) 50 MG 24 hr tablet TAKE 1 TABLET (50 MG TOTAL) BY MOUTH DAILY. TAKE WITH OR IMMEDIATELY FOLLOWING A MEAL. 90 tablet 1  . mirabegron ER (MYRBETRIQ) 50 MG TB24 tablet Take 1 tablet (50 mg total) by mouth daily. 28 tablet 0  . mirabegron ER (MYRBETRIQ) 50 MG TB24 tablet Take 1 tablet (50 mg total) by mouth daily. 28 tablet 0  . nitrofurantoin, macrocrystal-monohydrate, (MACROBID) 100 MG capsule Take 1 capsule (100 mg total) by mouth 2 (two) times daily. 10 capsule 0  . omeprazole (PRILOSEC) 20 MG capsule TAKE 1 CAPSULE (20 MG TOTAL) DAILY BY MOUTH. 90 capsule 0  . ondansetron (ZOFRAN) 4 MG tablet Take 1 tablet (4 mg total) by  mouth every 8 (eight) hours as needed. 90 tablet 2  . ONE TOUCH ULTRA TEST test strip 1 EACH BY OTHER ROUTE DAILY AS NEEDED FOR OTHER. 25 each 5  . OXYGEN-HELIUM IN Inhale 2 % into the lungs at bedtime.      . rosuvastatin (CRESTOR) 5 MG tablet TAKE 1 TABLET BY MOUTH EVERY DAY 90 tablet 0  . sertraline (ZOLOFT) 100 MG tablet TAKE 2 TABLETS BY MOUTH TWICE A DAY 360 tablet 0   No current facility-administered medications for this visit.     Allergies  Allergen Reactions  . Amoxicillin-Pot Clavulanate     REACTION: stomach sensitive.  Lenice Llamas. Daliresp [Roflumilast]     Severe nausea   . Erythromycin Base     REACTION: GI upset  . Neurontin  [Gabapentin]     anxiety    Family History  Problem Relation Age of Onset  . Diabetes Daughter   . Kidney disease Daughter   . Lung cancer Mother        smoked  . COPD Mother        smoked  . COPD Father        smoked  . Prostate cancer Father   . Kidney failure Father   . Breast cancer Maternal Aunt   . Diabetes Maternal Grandmother   . Breast cancer Maternal Grandmother   . Cancer Cousin        colon    Social History   Socioeconomic History  . Marital status: Married    Spouse name: Not on file  . Number of children: Not on file  . Years of education: Not on file  . Highest education level: Not on file  Occupational History  . Not on file  Social Needs  . Financial resource strain: Not on file  . Food insecurity:    Worry: Not on file    Inability: Not on file  . Transportation needs:    Medical: Not on file    Non-medical: Not on file  Tobacco Use  . Smoking status: Former Smoker    Packs/day: 0.25    Years: 40.00    Pack years: 10.00    Types: Cigarettes    Last attempt to quit: 09/01/2016    Years since quitting: 2.1  . Smokeless tobacco: Never Used  . Tobacco comment: Uses nicotrol inhaler on occasion  Substance and Sexual Activity  . Alcohol use: No  . Drug use: No  . Sexual activity: Not Currently  Lifestyle  . Physical activity:    Days per week: Not on file    Minutes per session: Not on file  . Stress: Not on file  Relationships  . Social connections:    Talks on phone: Not on file    Gets together: Not on file    Attends religious service: Not on file    Active member of club or organization: Not on file    Attends meetings of clubs or organizations: Not on file    Relationship status: Not on file  . Intimate partner violence:    Fear of current or ex partner: Not on file    Emotionally abused: Not on file    Physically abused: Not on file    Forced sexual activity: Not on file  Other Topics Concern  . Not on file  Social History  Narrative   [place] in [place]. 2 dogs. Works at [employer].     Constitutional: Denies fever, malaise, fatigue, headache or  abrupt weight changes.  Psych: Pt reports anxiety. Denies depression, SI/HI.  No other specific complaints in a complete review of systems (except as listed in HPI above).  Objective:   Physical Exam  Pulse 98   Temp 97.6 F (36.4 C) (Oral)   Wt 158 lb (71.7 kg)   SpO2 97%   BMI 27.12 kg/m  Wt Readings from Last 3 Encounters:  10/21/18 158 lb (71.7 kg)  10/05/18 153 lb (69.4 kg)  09/27/18 159 lb (72.1 kg)    General: Appears her stated age, well developed, well nourished in NAD. Neurological: Alert and oriented.  Psychiatric: Mood and affect normal. Behavior is normal. Judgment and thought content normal.     BMET    Component Value Date/Time   NA 140 10/05/2018 1721   NA 140 09/28/2014 0637   K 4.2 10/05/2018 1721   K 4.3 09/28/2014 0637   CL 93 (L) 10/05/2018 1721   CL 108 (H) 09/28/2014 0637   CO2 37 (H) 10/05/2018 1721   CO2 26 09/28/2014 0637   GLUCOSE 151 (H) 10/05/2018 1721   GLUCOSE 117 (H) 09/28/2014 0637   BUN 22 10/05/2018 1721   BUN 13 09/28/2014 0637   CREATININE 1.32 (H) 10/05/2018 1721   CREATININE 0.82 10/31/2016 1622   CALCIUM 9.7 10/05/2018 1721   CALCIUM 7.7 (L) 09/28/2014 0637   GFRNONAA 40 (L) 10/05/2018 1721   GFRNONAA >60 09/28/2014 0637   GFRAA 46 (L) 10/05/2018 1721   GFRAA >60 09/28/2014 0637    Lipid Panel     Component Value Date/Time   CHOL 156 01/20/2017 1407   CHOL 110 09/28/2014 0637   TRIG 59.0 01/20/2017 1407   TRIG 102 09/28/2014 0637   HDL 82.90 01/20/2017 1407   HDL 54 09/28/2014 0637   CHOLHDL 2 01/20/2017 1407   VLDL 11.8 01/20/2017 1407   VLDL 20 09/28/2014 0637   LDLCALC 61 01/20/2017 1407   LDLCALC 36 09/28/2014 0637    CBC    Component Value Date/Time   WBC 6.2 10/05/2018 1721   RBC 4.21 10/05/2018 1721   HGB 11.9 (L) 10/05/2018 1721   HGB 13.0 09/30/2014 0525   HCT  38.9 10/05/2018 1721   HCT 39.0 09/30/2014 0525   PLT 258 10/05/2018 1721   PLT 234 09/30/2014 0525   MCV 92.4 10/05/2018 1721   MCV 93 09/30/2014 0525   MCH 28.3 10/05/2018 1721   MCHC 30.6 10/05/2018 1721   RDW 13.3 10/05/2018 1721   RDW 14.1 09/30/2014 0525   LYMPHSABS 1.3 11/09/2017 1525   LYMPHSABS 1.3 09/30/2014 0525   MONOABS 0.5 11/09/2017 1525   MONOABS 0.5 09/30/2014 0525   EOSABS 0.1 11/09/2017 1525   EOSABS 0.2 09/30/2014 0525   BASOSABS 0.0 11/09/2017 1525   BASOSABS 0.0 09/30/2014 0525    Hgb A1C Lab Results  Component Value Date   HGBA1C 5.9 (A) 06/03/2018            Assessment & Plan:

## 2018-10-21 NOTE — Telephone Encounter (Signed)
Spoke to patient, she states that she does not like the new medications Dr. Sung AmabileSimonds put her on last visit. She wishes to go back to spiriva and advair. I will confirm with Dr. Sung AmabileSimonds and let the patient know tomorrow. She is aware.

## 2018-10-22 ENCOUNTER — Telehealth: Payer: Self-pay

## 2018-10-22 ENCOUNTER — Other Ambulatory Visit: Payer: Self-pay

## 2018-10-22 MED ORDER — TIOTROPIUM BROMIDE MONOHYDRATE 18 MCG IN CAPS: 18.0000 ug | ORAL_CAPSULE | Freq: Every day | RESPIRATORY_TRACT | 12 refills | Status: DC

## 2018-10-22 MED ORDER — FLUTICASONE-SALMETEROL 250-50 MCG/DOSE IN AEPB: 1.0000 | INHALATION_SPRAY | Freq: Two times a day (BID) | RESPIRATORY_TRACT | 11 refills | Status: DC

## 2018-10-22 NOTE — Telephone Encounter (Signed)
Spoke to patient, let her know that Dr. Sung AmabileSimonds said it was okay to go back to [place]iva and Wixela. New orders entered. Patient aware. Nothing further needed at this time.

## 2018-10-22 NOTE — Telephone Encounter (Signed)
OK to go back to previous meds

## 2018-10-29 ENCOUNTER — Ambulatory Visit: Payer: Medicare Other | Admitting: Internal Medicine

## 2018-10-29 ENCOUNTER — Encounter: Payer: Self-pay | Admitting: Internal Medicine

## 2018-10-29 ENCOUNTER — Ambulatory Visit (INDEPENDENT_AMBULATORY_CARE_PROVIDER_SITE_OTHER): Payer: Medicare Other

## 2018-10-29 VITALS — BP 98/70 | HR 88 | Temp 98.1°F | Resp 18 | Ht 64.0 in | Wt 156.0 lb

## 2018-10-29 DIAGNOSIS — I739 Peripheral vascular disease, unspecified: Secondary | ICD-10-CM

## 2018-10-29 DIAGNOSIS — J441 Chronic obstructive pulmonary disease with (acute) exacerbation: Secondary | ICD-10-CM | POA: Diagnosis not present

## 2018-10-29 DIAGNOSIS — M79606 Pain in leg, unspecified: Secondary | ICD-10-CM | POA: Diagnosis not present

## 2018-10-29 MED ORDER — METHYLPREDNISOLONE ACETATE 80 MG/ML IJ SUSP
80.0000 mg | Freq: Once | INTRAMUSCULAR | Status: AC
  Administered 2018-10-29: 80 mg via INTRAMUSCULAR

## 2018-10-29 MED ORDER — PREDNISONE 20 MG PO TABS: 40.0000 mg | ORAL_TABLET | Freq: Every day | ORAL | 0 refills | Status: DC

## 2018-10-29 NOTE — Progress Notes (Signed)
Subjective:    Patient ID: Tami Skinner, female    DOB: 07/29/1946, 72 y.o.   MRN: [id number]  HPI Here due to respiratory infection  Has been sick all week Seems to have worsened her COPD Hears crackling and noises in chest Seems better and clears out some as she is up and moving around Dark yellow sputum--from chest  Some headache---mostly by throat Also frontal Not overly congested in head No clear fever Some chills but not sweats  Breathing is about the same as usual 24/7 oxygen  No other meds  Current Outpatient Medications on File Prior to Visit  Medication Sig Dispense Refill  . albuterol (PROVENTIL HFA;VENTOLIN HFA) 108 (90 Base) MCG/ACT inhaler Inhale 2 puffs into the lungs every 4 (four) hours as needed for wheezing or shortness of breath. 1 Inhaler 6  . albuterol (PROVENTIL) (2.5 MG/3ML) 0.083% nebulizer solution Take 3 mLs (2.5 mg total) by nebulization every 6 (six) hours as needed for wheezing or shortness of breath. 75 mL 12  . ALPRAZolam (XANAX) 1 MG tablet Take 0.5 tablets (0.5 mg total) by mouth 3 (three) times daily. 45 tablet 0  . buPROPion (WELLBUTRIN XL) 150 MG 24 hr tablet TAKE 1 TABLET BY MOUTH EVERY DAY 90 tablet 1  . busPIRone (BUSPAR) 10 MG tablet Take 1 tablet (10 mg total) by mouth 3 (three) times daily. 90 tablet 2  . Cholecalciferol (VITAMIN D-3) 1000 UNITS CAPS Take 2 capsules by mouth.     . conjugated estrogens (PREMARIN) vaginal cream Apply 0.5mg  (pea-sized amount)  just inside the vaginal introitus with a finger-tip on  Monday, Wednesday and Friday nights. 30 g 12  . Fluticasone-Salmeterol (WIXELA INHUB) 250-50 MCG/DOSE AEPB Inhale 1 puff into the lungs 2 (two) times daily. 60 each 11  . furosemide (LASIX) 20 MG tablet Take 1 tablet (20 mg total) by mouth as needed for edema. 30 tablet 5  . metFORMIN (GLUCOPHAGE) 500 MG tablet TAKE 1 TABLET BY MOUTH TWICE A DAY WITH A MEAL 180 tablet 0  . metoprolol succinate (TOPROL-XL) 50 MG 24 hr tablet  TAKE 1 TABLET (50 MG TOTAL) BY MOUTH DAILY. TAKE WITH OR IMMEDIATELY FOLLOWING A MEAL. 90 tablet 1  . mirabegron ER (MYRBETRIQ) 50 MG TB24 tablet Take 1 tablet (50 mg total) by mouth daily. 28 tablet 0  . omeprazole (PRILOSEC) 20 MG capsule TAKE 1 CAPSULE (20 MG TOTAL) DAILY BY MOUTH. 90 capsule 0  . ondansetron (ZOFRAN) 4 MG tablet Take 1 tablet (4 mg total) by mouth every 8 (eight) hours as needed. 90 tablet 2  . ONE TOUCH ULTRA TEST test strip 1 EACH BY OTHER ROUTE DAILY AS NEEDED FOR OTHER. 25 each 5  . OXYGEN-HELIUM IN Inhale 2 % into the lungs at bedtime.      . rosuvastatin (CRESTOR) 5 MG tablet TAKE 1 TABLET BY MOUTH EVERY DAY 90 tablet 0  . sertraline (ZOLOFT) 100 MG tablet TAKE 2 TABLETS BY MOUTH TWICE A DAY 360 tablet 0  . tiotropium (SPIRIVA HANDIHALER) 18 MCG inhalation capsule Place 1 capsule (18 mcg total) into inhaler and inhale daily. 30 capsule 12   No current facility-administered medications on file prior to visit.     Allergies  Allergen Reactions  . Amoxicillin-Pot Clavulanate     REACTION: stomach sensitive.  Lenice Llamas. Daliresp [Roflumilast]     Severe nausea   . Erythromycin Base     REACTION: GI upset  . Neurontin [Gabapentin]     anxiety  Past Medical History:  Diagnosis Date  . Anxiety   . Asthmatic bronchitis   . COPD (chronic obstructive pulmonary disease) (HCC)   . Depression   . Diabetes mellitus   . Diverticulitis   . Diverticulosis   . Hyperlipidemia   . Hypertension   . Osteopenia   . Sleep apnea   . Stress-induced cardiomyopathy    a. echo 09/28/2014: EF 45-50%, severe HK of mid-distal anterior, apical and mid-distal inferior walls suggestive of stress induced CM, mildly dilated LA, mildly dilated PASP, mild TR, trivial pericardial effusion    Past Surgical History:  Procedure Laterality Date  . ABDOMINAL HYSTERECTOMY     partial  . BREAST BIOPSY Right    neg  . CARDIAC CATHETERIZATION  09/29/2014   [facility]  . NASAL SINUS SURGERY       Family History  Problem Relation Age of Onset  . Diabetes Daughter   . Kidney disease Daughter   . Lung cancer Mother        smoked  . COPD Mother        smoked  . COPD Father        smoked  . Prostate cancer Father   . Kidney failure Father   . Breast cancer Maternal Aunt   . Diabetes Maternal Grandmother   . Breast cancer Maternal Grandmother   . Cancer Cousin        colon    Social History   Socioeconomic History  . Marital status: Married    Spouse name: Not on file  . Number of children: Not on file  . Years of education: Not on file  . Highest education level: Not on file  Occupational History  . Not on file  Social Needs  . Financial resource strain: Not on file  . Food insecurity:    Worry: Not on file    Inability: Not on file  . Transportation needs:    Medical: Not on file    Non-medical: Not on file  Tobacco Use  . Smoking status: Former Smoker    Packs/day: 0.25    Years: 40.00    Pack years: 10.00    Types: Cigarettes    Last attempt to quit: 09/01/2016    Years since quitting: 2.1  . Smokeless tobacco: Never Used  . Tobacco comment: Uses nicotrol inhaler on occasion  Substance and Sexual Activity  . Alcohol use: No  . Drug use: No  . Sexual activity: Not Currently  Lifestyle  . Physical activity:    Days per week: Not on file    Minutes per session: Not on file  . Stress: Not on file  Relationships  . Social connections:    Talks on phone: Not on file    Gets together: Not on file    Attends religious service: Not on file    Active member of club or organization: Not on file    Attends meetings of clubs or organizations: Not on file    Relationship status: Not on file  . Intimate partner violence:    Fear of current or ex partner: Not on file    Emotionally abused: Not on file    Physically abused: Not on file    Forced sexual activity: Not on file  Other Topics Concern  . Not on file  Social History Narrative   [place]ives [place]. 2 dogs. Works at [employer].   Review of Systems  Some nausea---if she bends over (goes  back months)--no change No vomiting or diarrhea Appetite is off with this illness     Objective:   Physical Exam  Constitutional:  No sig distress but wracking cough at times  HENT:  Nose: Nose normal.  Mouth/Throat: Oropharynx is clear and moist. No oropharyngeal exudate.  TMs normal  Neck: No thyromegaly present.  Cardiovascular: Normal rate, regular rhythm and normal heart sounds. Exam reveals no gallop.  No murmur heard. Respiratory:  No sig distress Very little air movement Sig prolongation of exp phase and wheezing No crackles  Lymphadenopathy:    She has no cervical adenopathy.           Assessment & Plan:

## 2018-10-29 NOTE — Patient Instructions (Signed)
Start the z-pak today. Start the prednisone prescription tomorrow.

## 2018-10-29 NOTE — Assessment & Plan Note (Signed)
No evidence of pneumonia She has left over z-pak---advised her to take this now depomedrol 80mg  IM here 5 days prednisone ER if sig worsening

## 2018-10-29 NOTE — Addendum Note (Signed)
Addended by: Roena MaladyEVONTENNO, MELANIE Y on: 10/29/2018 01:27 PM   Modules accepted: Orders

## 2018-11-04 ENCOUNTER — Encounter: Payer: Self-pay | Admitting: Internal Medicine

## 2018-11-12 ENCOUNTER — Other Ambulatory Visit: Payer: Self-pay | Admitting: Internal Medicine

## 2018-11-12 DIAGNOSIS — E119 Type 2 diabetes mellitus without complications: Secondary | ICD-10-CM

## 2018-11-12 DIAGNOSIS — R110 Nausea: Secondary | ICD-10-CM

## 2018-11-13 ENCOUNTER — Other Ambulatory Visit: Payer: Self-pay | Admitting: Internal Medicine

## 2018-11-15 NOTE — Telephone Encounter (Signed)
Not due until 11/21/2018

## 2018-11-17 ENCOUNTER — Encounter: Payer: Self-pay | Admitting: Internal Medicine

## 2018-11-17 ENCOUNTER — Ambulatory Visit: Payer: Medicare Other | Admitting: Internal Medicine

## 2018-11-17 ENCOUNTER — Encounter

## 2018-11-18 ENCOUNTER — Ambulatory Visit: Payer: Medicare Other | Admitting: Urology

## 2018-11-19 ENCOUNTER — Telehealth: Payer: Self-pay | Admitting: Internal Medicine

## 2018-11-19 MED ORDER — ALPRAZOLAM 1 MG PO TABS: 0.5000 mg | ORAL_TABLET | Freq: Three times a day (TID) | ORAL | 0 refills | Status: DC

## 2018-11-19 NOTE — Telephone Encounter (Signed)
Last office visit 10/29/2018 with Dr. Alphonsus Sias for COPD exacerbation.  Last refilled 10/21/2018 for #45 with no refills.  No future appointments.  Will forward to R. Baity and Dr. Ermalene Searing since Rene Kocher is not in the office this afternoon.

## 2018-11-19 NOTE — Telephone Encounter (Signed)
MyChart message sent to Tami Skinner letting her know that Dr. Ermalene Searing refilled her Xanax to last until Tuesday.  [name] will return on Monday and can make a long term med refill at that time.

## 2018-11-19 NOTE — Telephone Encounter (Signed)
This pt has been taking more Xanax than directed and running out early which is why I denied her refill. This was inappropriate for this to be sent in. I will no longer be filling her Xanax.

## 2018-11-19 NOTE — Telephone Encounter (Signed)
Pt need refill for Xanax    Sent to [facility]

## 2018-11-19 NOTE — Telephone Encounter (Signed)
Please call pt.. I have refilled her Xanax to last until  Tuesday. [name] will return on MONday and can make a long term med refill .

## 2018-11-19 NOTE — Addendum Note (Signed)
Addended by: Damita Lack on: 11/19/2018 04:16 PM   Modules accepted: Orders

## 2018-11-19 NOTE — Telephone Encounter (Signed)
Please call pt and let her know I will not be filling her Xanax anymore. She is running out early, taking more than prescribed. This should not have been refilled.

## 2018-11-21 ENCOUNTER — Other Ambulatory Visit: Payer: Self-pay | Admitting: Internal Medicine

## 2018-11-22 ENCOUNTER — Other Ambulatory Visit: Payer: Self-pay | Admitting: Pulmonary Disease

## 2018-11-23 ENCOUNTER — Encounter: Payer: Self-pay | Admitting: Internal Medicine

## 2018-11-24 ENCOUNTER — Encounter: Payer: Self-pay | Admitting: Internal Medicine

## 2018-11-24 ENCOUNTER — Other Ambulatory Visit: Payer: Self-pay

## 2018-11-24 MED ORDER — ALPRAZOLAM 1 MG PO TABS: 0.5000 mg | ORAL_TABLET | Freq: Three times a day (TID) | ORAL | 0 refills | Status: DC

## 2018-12-01 ENCOUNTER — Encounter: Payer: Medicare Other | Admitting: Obstetrics and Gynecology

## 2018-12-07 ENCOUNTER — Encounter: Payer: Self-pay | Admitting: *Deleted

## 2018-12-20 ENCOUNTER — Ambulatory Visit: Payer: Self-pay | Admitting: Podiatry

## 2018-12-22 ENCOUNTER — Encounter: Payer: Self-pay | Admitting: Internal Medicine

## 2018-12-27 ENCOUNTER — Encounter: Payer: Self-pay | Admitting: Podiatry

## 2018-12-27 ENCOUNTER — Ambulatory Visit: Payer: Medicare Other | Admitting: Podiatry

## 2018-12-27 DIAGNOSIS — M79674 Pain in right toe(s): Secondary | ICD-10-CM | POA: Diagnosis not present

## 2018-12-27 DIAGNOSIS — E1142 Type 2 diabetes mellitus with diabetic polyneuropathy: Secondary | ICD-10-CM

## 2018-12-27 DIAGNOSIS — M79675 Pain in left toe(s): Secondary | ICD-10-CM | POA: Diagnosis not present

## 2018-12-27 DIAGNOSIS — M2010 Hallux valgus (acquired), unspecified foot: Secondary | ICD-10-CM

## 2018-12-27 DIAGNOSIS — B351 Tinea unguium: Secondary | ICD-10-CM

## 2018-12-27 NOTE — Progress Notes (Signed)
This patient presents to the office with chief complaint of long thick big toe  nails and diabetic feet.  This patient  says there  is  no pain and discomfort in her feet.  This patient says there are long thick painful big toe nails.  These nails are painful walking and wearing shoes.  Patient has no history of infection or drainage from both feet.  Patient is unable to  self treat his own nails .Patient shows me a picture of her feet taken in December 2019. She had vascular tests performed in late December 2019.  No notes of test results noted  The feet were red and significantly swollen. This patient presents  to the office today for treatment of the  long nails and a foot evaluation due to history of  Diabetes. Patient also requests diabetic shoes.  General Appearance  Alert, conversant and in no acute stress.  Vascular  Dorsalis pedis and posterior tibial  pulses are palpable  bilaterally.  Capillary return is within normal limits  bilaterally. Temperature is within normal limits  bilaterally.  Neurologic  Senn-Weinstein monofilament wire test diminished   bilaterally. Muscle power within normal limits bilaterally.  Nails Thick disfigured discolored nails with subungual debris  from hallux to fifth toes bilaterally. No evidence of bacterial infection or drainage bilaterally.  Orthopedic  No limitations of motion of motion feet .  No crepitus or effusions noted.  HAV  B/L with contracted digits  B/L.  Skin  normotropic skin with no porokeratosis noted bilaterally.  No signs of infections or ulcers noted.     Onychomycosis  Diabetes with neuropathy.  IE  Debride nails x 2.  A diabetic foot exam was performed and there is no evidence of any vascular  pathology. Diminished LOPS noted.  Patient qualifies for diabetic shoes due to DPN and HAV  B/L.   RTC 3 months for preventative foot care services.   Helane Gunther DPM

## 2018-12-30 ENCOUNTER — Encounter: Payer: Self-pay | Admitting: Pulmonary Disease

## 2018-12-30 ENCOUNTER — Ambulatory Visit: Payer: Medicare Other | Admitting: Pulmonary Disease

## 2018-12-30 VITALS — BP 130/82 | HR 76 | Ht 64.0 in

## 2018-12-30 DIAGNOSIS — J9611 Chronic respiratory failure with hypoxia: Secondary | ICD-10-CM

## 2018-12-30 DIAGNOSIS — F411 Generalized anxiety disorder: Secondary | ICD-10-CM

## 2018-12-30 DIAGNOSIS — R0602 Shortness of breath: Secondary | ICD-10-CM | POA: Diagnosis not present

## 2018-12-30 DIAGNOSIS — J449 Chronic obstructive pulmonary disease, unspecified: Secondary | ICD-10-CM

## 2018-12-30 MED ORDER — ALPRAZOLAM 1 MG PO TABS: 0.5000 mg | ORAL_TABLET | Freq: Three times a day (TID) | ORAL | 3 refills | Status: DC

## 2018-12-30 NOTE — Patient Instructions (Addendum)
Continue Wixela and Spiriva inhalers Continue oxygen therapy is close to 24 hours/day as possible Continue albuterol inhaler as needed I have refilled the Xanax prescription Follow-up in 4 months.  Call sooner if needed

## 2018-12-31 ENCOUNTER — Encounter: Payer: Self-pay | Admitting: Internal Medicine

## 2018-12-31 ENCOUNTER — Ambulatory Visit (INDEPENDENT_AMBULATORY_CARE_PROVIDER_SITE_OTHER): Payer: Medicare Other | Admitting: Internal Medicine

## 2018-12-31 VITALS — BP 126/74 | HR 77 | Temp 97.9°F | Ht 64.0 in | Wt 156.0 lb

## 2018-12-31 DIAGNOSIS — G4733 Obstructive sleep apnea (adult) (pediatric): Secondary | ICD-10-CM

## 2018-12-31 DIAGNOSIS — I214 Non-ST elevation (NSTEMI) myocardial infarction: Secondary | ICD-10-CM

## 2018-12-31 DIAGNOSIS — Z0000 Encounter for general adult medical examination without abnormal findings: Secondary | ICD-10-CM

## 2018-12-31 DIAGNOSIS — E7800 Pure hypercholesterolemia, unspecified: Secondary | ICD-10-CM

## 2018-12-31 DIAGNOSIS — F329 Major depressive disorder, single episode, unspecified: Secondary | ICD-10-CM

## 2018-12-31 DIAGNOSIS — I10 Essential (primary) hypertension: Secondary | ICD-10-CM

## 2018-12-31 DIAGNOSIS — Z780 Asymptomatic menopausal state: Secondary | ICD-10-CM

## 2018-12-31 DIAGNOSIS — J449 Chronic obstructive pulmonary disease, unspecified: Secondary | ICD-10-CM | POA: Diagnosis not present

## 2018-12-31 DIAGNOSIS — F419 Anxiety disorder, unspecified: Secondary | ICD-10-CM | POA: Diagnosis not present

## 2018-12-31 DIAGNOSIS — Z1239 Encounter for other screening for malignant neoplasm of breast: Secondary | ICD-10-CM

## 2018-12-31 DIAGNOSIS — E119 Type 2 diabetes mellitus without complications: Secondary | ICD-10-CM

## 2018-12-31 DIAGNOSIS — E559 Vitamin D deficiency, unspecified: Secondary | ICD-10-CM

## 2018-12-31 MED ORDER — OMEPRAZOLE 40 MG PO CPDR: 40.0000 mg | DELAYED_RELEASE_CAPSULE | Freq: Every day | ORAL | 3 refills | Status: DC

## 2018-12-31 NOTE — Progress Notes (Signed)
HPI:  Pt presents to the clinic today for her Medicare Wellness Exam. She is also due to follow up chronic conditions.  Anxiety and Depression: Chronic. Managed on Wellbutrin, Sertraline, Buspar and Xanax. She is now having Dr. Bard Herbert fill her Xanax, and is concerned that she can not fill his RX until 3/24. She is concerned that she will run out, however her last refill from me should last until 3/24. She is not seeing a therapist. She denies SI/HI.  COPD with [facility]: She reports chronic cough and shortness of breath. She is taking Wixela, Spiriva and Albuterol as prescribed. She is on continuous oxygen. She follows with Dr. Bard Herbert.  DM 2: Her last A1C was 5.9%, 06/2018. She does not check her sugars. She is not taking any diabetic medication at this time.  HLD: Her last LDL was 61, 01/2017. She takes Rosuvastatin as prescribed. She denies myalgias. She tries to consume a low fat diet.  HTN with Cardiomyopathy: Her BP today is 126/74. She is taking Furosemide and Metoprolol as prescribed. ECG from 10/2018 reviewed.  GERD: Persistent. She is having breakthrough on Omeprazole. Upper GI from 09/2011 reviewed.   Past Medical History:  Diagnosis Date  . Anxiety   . Asthmatic bronchitis   . COPD (chronic obstructive pulmonary disease) (HCC)   . Depression   . Diabetes mellitus   . Diverticulitis   . Diverticulosis   . Hyperlipidemia   . Hypertension   . Osteopenia   . Sleep apnea   . Stress-induced cardiomyopathy    a. echo 09/28/2014: EF 45-50%, severe HK of mid-distal anterior, apical and mid-distal inferior walls suggestive of stress induced CM, mildly dilated LA, mildly dilated PASP, mild TR, trivial pericardial effusion    Current Outpatient Medications  Medication Sig Dispense Refill  . albuterol (PROVENTIL HFA;VENTOLIN HFA) 108 (90 Base) MCG/ACT inhaler INHALE 2 PUFFS INTO THE LUNGS EVERY 4 (FOUR) HOURS AS NEEDED FOR WHEEZING OR SHORTNESS OF BREATH. 18 Inhaler 6  . albuterol  (PROVENTIL) (2.5 MG/3ML) 0.083% nebulizer solution Take 3 mLs (2.5 mg total) by nebulization every 6 (six) hours as needed for wheezing or shortness of breath. J44.9 75 mL 12  . ALPRAZolam (XANAX) 1 MG tablet Take 0.5 tablets (0.5 mg total) by mouth 3 (three) times daily. 50 tablet 3  . buPROPion (WELLBUTRIN XL) 150 MG 24 hr tablet TAKE 1 TABLET BY MOUTH EVERY DAY 90 tablet 1  . busPIRone (BUSPAR) 10 MG tablet TAKE 1 TABLET BY MOUTH THREE TIMES A DAY 270 tablet 0  . Cholecalciferol (VITAMIN D-3) 1000 UNITS CAPS Take 2 capsules by mouth.     . conjugated estrogens (PREMARIN) vaginal cream Apply 0.5mg  (pea-sized amount)  just inside the vaginal introitus with a finger-tip on  Monday, Wednesday and Friday nights. 30 g 12  . Fluticasone-Salmeterol (WIXELA INHUB) 250-50 MCG/DOSE AEPB Inhale 1 puff into the lungs 2 (two) times daily. 60 each 11  . furosemide (LASIX) 20 MG tablet Take 1 tablet (20 mg total) by mouth as needed for edema. 30 tablet 5  . metoprolol succinate (TOPROL-XL) 50 MG 24 hr tablet TAKE 1 TABLET (50 MG TOTAL) BY MOUTH DAILY. TAKE WITH OR IMMEDIATELY FOLLOWING A MEAL. 90 tablet 1  . omeprazole (PRILOSEC) 20 MG capsule TAKE 1 CAPSULE (20 MG TOTAL) DAILY BY MOUTH. 90 capsule 0  . ondansetron (ZOFRAN) 4 MG tablet Take 1 tablet (4 mg total) by mouth every 8 (eight) hours as needed. 90 tablet 2  . ONE TOUCH ULTRA TEST  test strip 1 EACH BY OTHER ROUTE DAILY AS NEEDED FOR OTHER. 25 each 5  . OXYGEN-HELIUM IN Inhale 2 % into the lungs at bedtime.      . rosuvastatin (CRESTOR) 5 MG tablet TAKE 1 TABLET BY MOUTH EVERY DAY 90 tablet 0  . sertraline (ZOLOFT) 100 MG tablet TAKE 2 TABLETS BY MOUTH TWICE A DAY 360 tablet 0  . tiotropium (SPIRIVA HANDIHALER) 18 MCG inhalation capsule Place 1 capsule (18 mcg total) into inhaler and inhale daily. 30 capsule 12   No current facility-administered medications for this visit.     Allergies  Allergen Reactions  . Amoxicillin-Pot Clavulanate      REACTION: stomach sensitive.  Lenice Llamas [Roflumilast]     Severe nausea   . Erythromycin Base     REACTION: GI upset  . Neurontin [Gabapentin]     anxiety    Family History  Problem Relation Age of Onset  . Diabetes Daughter   . Kidney disease Daughter   . Lung cancer Mother        smoked  . COPD Mother        smoked  . COPD Father        smoked  . Prostate cancer Father   . Kidney failure Father   . Breast cancer Maternal Aunt   . Diabetes Maternal Grandmother   . Breast cancer Maternal Grandmother   . Cancer Cousin        colon    Social History   Socioeconomic History  . Marital status: Married    Spouse name: Not on file  . Number of children: Not on file  . Years of education: Not on file  . Highest education level: Not on file  Occupational History  . Not on file  Social Needs  . Financial resource strain: Not on file  . Food insecurity:    Worry: Not on file    Inability: Not on file  . Transportation needs:    Medical: Not on file    Non-medical: Not on file  Tobacco Use  . Smoking status: Former Smoker    Packs/day: 0.25    Years: 40.00    Pack years: 10.00    Types: Cigarettes    Last attempt to quit: 09/01/2016    Years since quitting: 2.3  . Smokeless tobacco: Never Used  . Tobacco comment: Uses nicotrol inhaler on occasion  Substance and Sexual Activity  . Alcohol use: No  . Drug use: No  . Sexual activity: Not Currently  Lifestyle  . Physical activity:    Days per week: Not on file    Minutes per session: Not on file  . Stress: Not on file  Relationships  . Social connections:    Talks on phone: Not on file    Gets together: Not on file    Attends religious service: Not on file    Active member of club or organization: Not on file    Attends meetings of clubs or organizations: Not on file    Relationship status: Not on file  . Intimate partner violence:    Fear of current or ex partner: Not on file    Emotionally abused: Not on  file    Physically abused: Not on file    Forced sexual activity: Not on file  Other Topics Concern  . Not on file  Social History Narrative   Lives with husband in [place]. 2 dogs. Works at [employer].    Hospitiliaztions:  None  Health Maintenance:    Flu: 08/2018  Tetanus: 02/2008  Pneumovax: 09/2012  Prevnar: 05/2014  Zostavax: 02/2010  Mammogram: 05/2016  Pap Smear: no longer screening  Bone Density: 05/2013  Colon Screening: 02/2009  Eye Doctor: annually  Dental Exam: biannually   Providers:   PCP: [name], NP-C  Podiatry: Dr. Stacie Acres  Pulmonologist: Dr. Sung Amabile  Urology: Dr. Marvel Plan  Cardiology: Dr. Kirke Corin   I have personally reviewed and have noted:  1. The patient's medical and social history 2. Their use of alcohol, tobacco or illicit drugs 3. Their current medications and supplements 4. The patient's functional ability including ADL's, fall risks, home safety risks and hearing or visual impairment. 5. Diet and physical activities 6. Evidence for depression or mood disorder  Subjective:   Review of Systems:   Constitutional: Pt reports fatigue. Denies fever, malaise,headache or abrupt weight changes.  HEENT: Denies eye pain, eye redness, ear pain, ringing in the ears, wax buildup, runny nose, nasal congestion, bloody nose, or sore throat. Respiratory: Pt reports cough and shortness of breath. Denies difficulty breathing, or sputum production.   Cardiovascular: Denies chest pain, chest tightness, palpitations or swelling in the hands or feet.  Gastrointestinal: Pt reports reflux. Denies abdominal pain, bloating, constipation, diarrhea or blood in the stool.  GU: Pt reports stress incontinence, urinary frequency. Denies urgency, pain with urination, burning sensation, blood in urine, odor or discharge. Musculoskeletal: Denies decrease in range of motion, difficulty with gait, muscle pain or joint pain and swelling.  Skin: Denies redness, rashes, lesions or  ulcercations.  Neurological: Denies dizziness, difficulty with memory, difficulty with speech or problems with balance and coordination.  Psych: Pt reports anxiety. Denies  depression, SI/HI.  No other specific complaints in a complete review of systems (except as listed in HPI above).  Objective:  PE:  BP 126/74   Pulse 77   Temp 97.9 F (36.6 C) (Oral)   Ht 5\' 4"  (1.626 m)   Wt 156 lb (70.8 kg)   BMI 26.78 kg/m   Wt Readings from Last 3 Encounters:  10/29/18 156 lb (70.8 kg)  10/21/18 158 lb (71.7 kg)  10/05/18 153 lb (69.4 kg)    General: Appears her stated age, chronically ill appearing, in NAD. Skin: Warm, dry and intact.  HEENT: Head: normal shape and size; Eyes: sclera white, no icterus, conjunctiva pink, PERRLA and EOMs intact; Ears: Tm's gray and intact, normal light reflex; Throat/Mouth: Teeth present, mucosa pink and moist, no exudate, lesions or ulcerations noted.  Neck: Neck supple, trachea midline. No masses, lumps or thyromegaly present.  Cardiovascular: Normal rate and rhythm. S1,S2 noted.  No murmur, rubs or gallops noted. No JVD or BLE edema. No carotid bruits noted. Pulmonary/Chest: Normal effort and positive vesicular breath sounds. No respiratory distress. No wheezes, rales or ronchi noted.  Abdomen: Soft and nontender. Normal bowel sounds. No distention or masses noted.  Musculoskeletal: Gait slow and steady with use of rolling walker. Neurological: Alert and oriented.  Psychiatric: She is anxious appearing.  BMET    Component Value Date/Time   NA 140 10/05/2018 1721   NA 140 09/28/2014 0637   K 4.2 10/05/2018 1721   K 4.3 09/28/2014 0637   CL 93 (L) 10/05/2018 1721   CL 108 (H) 09/28/2014 0637   CO2 37 (H) 10/05/2018 1721   CO2 26 09/28/2014 0637   GLUCOSE 151 (H) 10/05/2018 1721   GLUCOSE 117 (H) 09/28/2014 0637   BUN 22 10/05/2018 1721  BUN 13 09/28/2014 0637   CREATININE 1.32 (H) 10/05/2018 1721   CREATININE 0.82 10/31/2016 1622   CALCIUM  9.7 10/05/2018 1721   CALCIUM 7.7 (L) 09/28/2014 0637   GFRNONAA 40 (L) 10/05/2018 1721   GFRNONAA >60 09/28/2014 0637   GFRAA 46 (L) 10/05/2018 1721   GFRAA >60 09/28/2014 0637    Lipid Panel     Component Value Date/Time   CHOL 156 01/20/2017 1407   CHOL 110 09/28/2014 0637   TRIG 59.0 01/20/2017 1407   TRIG 102 09/28/2014 0637   HDL 82.90 01/20/2017 1407   HDL 54 09/28/2014 0637   CHOLHDL 2 01/20/2017 1407   VLDL 11.8 01/20/2017 1407   VLDL 20 09/28/2014 0637   LDLCALC 61 01/20/2017 1407   LDLCALC 36 09/28/2014 0637    CBC    Component Value Date/Time   WBC 6.2 10/05/2018 1721   RBC 4.21 10/05/2018 1721   HGB 11.9 (L) 10/05/2018 1721   HGB 13.0 09/30/2014 0525   HCT 38.9 10/05/2018 1721   HCT 39.0 09/30/2014 0525   PLT 258 10/05/2018 1721   PLT 234 09/30/2014 0525   MCV 92.4 10/05/2018 1721   MCV 93 09/30/2014 0525   MCH 28.3 10/05/2018 1721   MCHC 30.6 10/05/2018 1721   RDW 13.3 10/05/2018 1721   RDW 14.1 09/30/2014 0525   LYMPHSABS 1.3 11/09/2017 1525   LYMPHSABS 1.3 09/30/2014 0525   MONOABS 0.5 11/09/2017 1525   MONOABS 0.5 09/30/2014 0525   EOSABS 0.1 11/09/2017 1525   EOSABS 0.2 09/30/2014 0525   BASOSABS 0.0 11/09/2017 1525   BASOSABS 0.0 09/30/2014 0525    Hgb A1C Lab Results  Component Value Date   HGBA1C 5.9 (A) 06/03/2018      Assessment and Plan:   Medicare Annual Wellness Visit:  Diet: She does eat meat. She consumes fruits and veggies. She tries to avoid fried foods. She drinks mostly water. Physical activity: Sedentary Depression/mood screen: Chronic, on medications. Hearing: Intact to whispered voice Visual acuity: Grossly normal, performs annual eye exam  ADLs: Capable Fall risk: High  D/t age, use of walker, use of oxygen Home safety: Good Cognitive evaluation: Intact to orientation, naming, recall and repetition EOL planning: Adv directives, full code/ I agree  Preventative Medicine: flu, pneumovax, prevnar and zostovax  UTD. She declines tetanus for financial reasons. Advised her if she wants to get shingrix, she should contact her pharmacy. Mammogram and bone density UTD. Will d/w with Dr. Sung Amabile to see if he feels like she could undergo a colonoscopy given her breathing issues. She no longer needs pap smear. Encouraged her to consume a balanced diet and exercise regimen. Advised her to see an eye doctor and dentist annually. Will check CBC, CMET, Lipid, Vit D and A1C today.   Next appointment: 6 months, follow up chronic conditions   [name], NP

## 2018-12-31 NOTE — Patient Instructions (Signed)
Health Maintenance After Age 74 After age 74, you are at a higher risk for certain long-term diseases and infections as well as injuries from falls. Falls are a major cause of broken bones and head injuries in people who are older than age 74. Getting regular preventive care can help to keep you healthy and well. Preventive care includes getting regular testing and making lifestyle changes as recommended by your health care provider. Talk with your health care provider about:  Which screenings and tests you should have. A screening is a test that checks for a disease when you have no symptoms.  A diet and exercise plan that is right for you. What should I know about screenings and tests to prevent falls? Screening and testing are the best ways to find a health problem early. Early diagnosis and treatment give you the best chance of managing medical conditions that are common after age 74. Certain conditions and lifestyle choices may make you more likely to have a fall. Your health care provider may recommend:  Regular vision checks. Poor vision and conditions such as cataracts can make you more likely to have a fall. If you wear glasses, make sure to get your prescription updated if your vision changes.  Medicine review. Work with your health care provider to regularly review all of the medicines you are taking, including over-the-counter medicines. Ask your health care provider about any side effects that may make you more likely to have a fall. Tell your health care provider if any medicines that you take make you feel dizzy or sleepy.  Osteoporosis screening. Osteoporosis is a condition that causes the bones to get weaker. This can make the bones weak and cause them to break more easily.  Blood pressure screening. Blood pressure changes and medicines to control blood pressure can make you feel dizzy.  Strength and balance checks. Your health care provider may recommend certain tests to check your  strength and balance while standing, walking, or changing positions.  Foot health exam. Foot pain and numbness, as well as not wearing proper footwear, can make you more likely to have a fall.  Depression screening. You may be more likely to have a fall if you have a fear of falling, feel emotionally low, or feel unable to do activities that you used to do.  Alcohol use screening. Using too much alcohol can affect your balance and may make you more likely to have a fall. What actions can I take to lower my risk of falls? General instructions  Talk with your health care provider about your risks for falling. Tell your health care provider if: ? You fall. Be sure to tell your health care provider about all falls, even ones that seem minor. ? You feel dizzy, sleepy, or off-balance.  Take over-the-counter and prescription medicines only as told by your health care provider. These include any supplements.  Eat a healthy diet and maintain a healthy weight. A healthy diet includes low-fat dairy products, low-fat (lean) meats, and fiber from whole grains, beans, and lots of fruits and vegetables. Home safety  Remove any tripping hazards, such as rugs, cords, and clutter.  Install safety equipment such as grab bars in bathrooms and safety rails on stairs.  Keep rooms and walkways well-lit. Activity   Follow a regular exercise program to stay fit. This will help you maintain your balance. Ask your health care provider what types of exercise are appropriate for you.  If you need a cane or   walker, use it as recommended by your health care provider.  Wear supportive shoes that have nonskid soles. Lifestyle  Do not drink alcohol if your health care provider tells you not to drink.  If you drink alcohol, limit how much you have: ? 0-1 drink a day for women. ? 0-2 drinks a day for men.  Be aware of how much alcohol is in your drink. In the [place] one drink equals one typical bottle of beer (12  oz), one-half glass of wine (5 oz), or one shot of hard liquor (1 oz).  Do not use any products that contain nicotine or tobacco, such as cigarettes and e-cigarettes. If you need help quitting, ask your health care provider. Summary  Having a healthy lifestyle and getting preventive care can help to protect your health and wellness after age 74.  Screening and testing are the best way to find a health problem early and help you avoid having a fall. Early diagnosis and treatment give you the best chance for managing medical conditions that are more common for people who are older than age 74.  Falls are a major cause of broken bones and head injuries in people who are older than age 74. Take precautions to prevent a fall at home.  Work with your health care provider to learn what changes you can make to improve your health and wellness and to prevent falls. This information is not intended to replace advice given to you by your health care provider. Make sure you discuss any questions you have with your health care provider. Document Released: 09/02/2017 Document Revised: 09/02/2017 Document Reviewed: 09/02/2017 Elsevier Interactive Patient Education  2019 [employer]  

## 2019-01-01 LAB — CBC
HCT: 38.8 % (ref 35.0–45.0)
Hemoglobin: 12.3 g/dL (ref 11.7–15.5)
MCH: 28 pg (ref 27.0–33.0)
MCHC: 31.7 g/dL — ABNORMAL LOW (ref 32.0–36.0)
MCV: 88.2 fL (ref 80.0–100.0)
MPV: 10.6 fL (ref 7.5–12.5)
Platelets: 217 10*3/uL (ref 140–400)
RBC: 4.4 10*6/uL (ref 3.80–5.10)
RDW: 13.4 % (ref 11.0–15.0)
WBC: 6 10*3/uL (ref 3.8–10.8)

## 2019-01-01 LAB — COMPREHENSIVE METABOLIC PANEL
AG Ratio: 2.1 (calc) (ref 1.0–2.5)
ALT: 11 U/L (ref 6–29)
AST: 18 U/L (ref 10–35)
Albumin: 4.4 g/dL (ref 3.6–5.1)
Alkaline phosphatase (APISO): 51 U/L (ref 37–153)
BUN/Creatinine Ratio: 21 (calc) (ref 6–22)
BUN: 25 mg/dL (ref 7–25)
CO2: 32 mmol/L (ref 20–32)
Calcium: 9.9 mg/dL (ref 8.6–10.4)
Chloride: 99 mmol/L (ref 98–110)
Creat: 1.17 mg/dL — ABNORMAL HIGH (ref 0.60–0.93)
Globulin: 2.1 g/dL (calc) (ref 1.9–3.7)
Glucose, Bld: 91 mg/dL (ref 65–99)
Potassium: 4.8 mmol/L (ref 3.5–5.3)
Sodium: 140 mmol/L (ref 135–146)
Total Bilirubin: 0.4 mg/dL (ref 0.2–1.2)
Total Protein: 6.5 g/dL (ref 6.1–8.1)

## 2019-01-01 LAB — HEMOGLOBIN A1C
Hgb A1c MFr Bld: 5.9 % of total Hgb — ABNORMAL HIGH (ref <5.7)
Mean Plasma Glucose: 123 (calc)
eAG (mmol/L): 6.8 (calc)

## 2019-01-01 LAB — LIPID PANEL
Cholesterol: 162 mg/dL (ref <200)
HDL: 77 mg/dL (ref >=50)
LDL Cholesterol (Calc): 67 mg/dL (calc)
Non-HDL Cholesterol (Calc): 85 mg/dL (calc) (ref <130)
Total CHOL/HDL Ratio: 2.1 (calc) (ref <5.0)
Triglycerides: 94 mg/dL (ref <150)

## 2019-01-01 LAB — VITAMIN D 25 HYDROXY (VIT D DEFICIENCY, FRACTURES): Vit D, 25-Hydroxy: 38 ng/mL (ref 30–100)

## 2019-01-02 ENCOUNTER — Encounter: Payer: Self-pay | Admitting: Internal Medicine

## 2019-01-02 MED ORDER — ALPRAZOLAM 1 MG PO TABS: 0.5000 mg | ORAL_TABLET | Freq: Three times a day (TID) | ORAL | 3 refills | Status: DC | PRN

## 2019-01-02 NOTE — Progress Notes (Signed)
PULMONARY OFFICE FOLLOW UP NOTE  Requesting MD/Service: Self Date of initial consultation: 09/29/16 Reason for consultation: COPD, smoker  PT PROFILE: 66 F former smoker (quit October 2017) previously followed by Dr Kendrick Fries for lung nodule and moderate COPD. Maintained on Spiriva, Advair, PRN albuterol with class III dyspnea  PROBLEMS: Chronic hypoxemic respiratory failure Very severe COPD Obstructive sleep apnea  DATA: Spirometry 02/03/12: Moderate obstruction (FEV1 1.21 liters, 49% pred) CT chest 09/27/14: Stable right lower lobe pulmonary nodule over over 2 year interval consistent benign etiology PFTs 11/11/16: mod-severe obstruction (FEV1 0.95 liters, 44% pred), mild restriction, severe reduction in DLCO. Lung volumes are probably underestimated by inadequate test performance CXR 11/28/16: hyperinflation. NACPD PFTs 09/17/17: Moderate obstruction (FEV1 1.20 L, 52% predicted.  Lung volumes normal.  DLCO markedly reduced at 43% predicted.  Borderline improvement with bronchodilator therapy  INTERVAL: Last seen by me 09/27/2018.  No major pulmonary events in the interim.    SUBJ: This is a scheduled follow-up visit.  Last visit, I transitioned her to nebulized budesonide and scheduled DuoNeb.  She did not like this change and has gone back to her previous regimen of Wixela and Spiriva inhalers as maintenance medications.  She is compliant with oxygen therapy.  She uses albuterol inhaler and DuoNeb as rescue medications but notices minimal benefit with these medications.  She has no significant change in severe exertional dyspnea.  She also reports severe fatigue.  There is some day-to-day variation.  At her baseline, she struggles with ADLs.  Her biggest concern is getting a refill of alprazolam.  She has been on this medication for a long time and often uses it for extreme dyspnea with associated anxiety noting that it would benefits her sense of dyspnea.  Her primary provider is  reluctant to prescribe alprazolam in the quantities that she is presently using.   MEDICATIONS: I have reviewed all medications and confirmed regimen as documented  Vitals:   12/30/18 1341 12/30/18 1347  BP:  130/82  Pulse:  76  SpO2:  92%  Height: 5\' 4"  (1.626 m)   2 LPM (pulse)  EXAM:  No overt distress at rest HEENT: NCAT, sclerae white Neck is supple without adenopathy or JVD She has moderate thoracic kyphosis Breath sounds are markedly diminished with minimal scattered wheezes RRR, no M Soft, NABS, NT Trace symmetric ankle and pedal edema No focal neurologic deficits  DATA:   BMP Latest Ref Rng & Units 12/31/2018 10/05/2018 11/09/2017  Glucose 65 - 99 mg/dL 91 641(R) 830(N)  BUN 7 - 25 mg/dL 25 22 20   Creatinine 0.60 - 0.93 mg/dL 4.07(W) 8.08(U) 1.10  BUN/Creat Ratio 6 - 22 (calc) 21 - -  Sodium 135 - 146 mmol/L 140 140 135  Potassium 3.5 - 5.3 mmol/L 4.8 4.2 4.5  Chloride 98 - 110 mmol/L 99 93(L) 98(L)  CO2 20 - 32 mmol/L 32 37(H) 29  Calcium 8.6 - 10.4 mg/dL 9.9 9.7 9.7    CBC Latest Ref Rng & Units 12/31/2018 10/05/2018 11/09/2017  WBC 3.8 - 10.8 Thousand/uL 6.0 6.2 5.9  Hemoglobin 11.7 - 15.5 g/dL 31.5 11.9(L) 13.6  Hematocrit 35.0 - 45.0 % 38.8 38.9 41.5  Platelets 140 - 400 Thousand/uL 217 258 232    CXR 12/25/18: Moderate kyphosis, emphysematous changes, no acute findings  IMPRESSION:     ICD-10-CM   1. COPD, very severe (HCC) J44.9   2. Chronic hypoxemic respiratory failure (HCC) J96.11   3. Generalized anxiety disorder F41.1   4. Severe chronic  dyspnea R06.02     She often finds herself in a vicious cycle of dyspnea causing anxiety, then anxiety exacerbating dyspnea.  She has been on alprazolam for many years and finds that this medication is beneficial in breaking this cycle.  Therefore, I willing to be the one to prescribe alprazolam.  We discussed it in detail and I made it very clear that I will will not prescribe ever increasing amounts.  It is to be  used only as needed for severe anxiety with accompanying dyspnea   PLAN:  Continue Wixela and Spiriva inhalers Continue oxygen therapy is close to 24 hours/day as possible Continue albuterol inhaler as needed I have refilled the Xanax prescription Follow-up in 4 months.  Call sooner if needed   Billy Fischer, MD PCCM service Mobile [phone] Pager [phone] 01/02/2019 4:16 PM

## 2019-01-03 ENCOUNTER — Telehealth: Payer: Self-pay

## 2019-01-03 NOTE — Telephone Encounter (Signed)
Spoke to patient, let her know we have the new prescription at the front desk for her. She is aware only to take as needed.

## 2019-01-03 NOTE — Telephone Encounter (Signed)
-----   Message from Merwyn Katos, MD sent at 01/02/2019  4:23 PM EST ----- The Rx that I put in was written as alprazolam TID on a schedule. This was a mistake. I want it to be TID PRN. There is a new prescription printed out. Please let her know that I want her to take the medication only as needed!!. Thanks  Tami Skinner

## 2019-01-04 ENCOUNTER — Ambulatory Visit: Payer: Medicare Other | Admitting: Urology

## 2019-01-05 ENCOUNTER — Telehealth: Payer: Self-pay

## 2019-01-05 NOTE — Telephone Encounter (Signed)
-----   Message from Lorre Munroe, NP sent at 01/04/2019  8:11 PM EST -----  Call pt:  Discussed colonoscopy with Dr. Bard Herbert. He does not feel like there is a benefit of doing a screening colonoscopy due to her severe breathing issues. Will hold off on colonoscopy for now. ----- Message ----- From: Merwyn Katos, MD Sent: 01/03/2019   8:18 AM EST To: Lorre Munroe, NP  What is the indication?  I doubt there is any benefit to a routine screening colonoscopy since her life expectancy is already very limited due to very severe COPD. If there is another indication (EG, GI bleeding), a high risk colonoscopy could be considered.   Theodoro Grist  ----- Message ----- From: Lorre Munroe, NP Sent: 01/02/2019   6:11 PM EST To: Merwyn Katos, MD  Do you think she would be able to be moderately sedated for a colonoscopy given her breathing issues?

## 2019-01-05 NOTE — Telephone Encounter (Signed)
Pt is aware as instructed 

## 2019-01-06 ENCOUNTER — Other Ambulatory Visit: Payer: Self-pay

## 2019-01-06 ENCOUNTER — Encounter: Payer: Self-pay | Admitting: Urology

## 2019-01-06 ENCOUNTER — Ambulatory Visit: Payer: Medicare Other | Admitting: Urology

## 2019-01-06 VITALS — BP 150/84 | HR 69 | Resp 19 | Ht 64.0 in | Wt 156.0 lb

## 2019-01-06 DIAGNOSIS — N3281 Overactive bladder: Secondary | ICD-10-CM | POA: Diagnosis not present

## 2019-01-06 DIAGNOSIS — N3946 Mixed incontinence: Secondary | ICD-10-CM | POA: Diagnosis not present

## 2019-01-06 DIAGNOSIS — Z87448 Personal history of other diseases of urinary system: Secondary | ICD-10-CM

## 2019-01-06 DIAGNOSIS — N390 Urinary tract infection, site not specified: Secondary | ICD-10-CM

## 2019-01-06 DIAGNOSIS — N952 Postmenopausal atrophic vaginitis: Secondary | ICD-10-CM

## 2019-01-06 LAB — URINALYSIS, COMPLETE
Bilirubin, UA: NEGATIVE
Glucose, UA: NEGATIVE
Ketones, UA: NEGATIVE
Nitrite, UA: NEGATIVE
PH UA: 6 (ref 5.0–7.5)
Specific Gravity, UA: 1.025 (ref 1.005–1.030)
Urobilinogen, Ur: 0.2 mg/dL (ref 0.2–1.0)

## 2019-01-06 LAB — MICROSCOPIC EXAMINATION

## 2019-01-06 LAB — BLADDER SCAN AMB NON-IMAGING: Scan Result: 43

## 2019-01-06 NOTE — Progress Notes (Addendum)
01/06/2019  4:26 PM   Tami Skinner Apr 07, 1946 [id number]  Referring provider: Lorre Munroe, NP [address] [place], [place] [address]  Chief Complaint  Patient presents with  . Follow-up   HPI: Patient is 73 year old Caucasian female presenting today for her PVR and OAB follow up. She has a history of hematuria, mixed urinary incontinence, OAB and vaginal atrophy.  Mixed urinary incontinence Failed anticholinergics due to intolerance of side effects. Shortly after beginning her Myrbetriq trial, she was started on Lasix which she believes is complicating her trial. She has been taking the samples but has not noticed any symptom improvement. She has an appointment with Dr. Valentino Saxon for evaluation and management of pessary.   The patient is  experiencing urgency 4-7 (stable), frequency 8 or more (stable), has begun to restrict fluids to avoid visits to the restroom as of 10/12/2018, is engaging in toilet mapping, incontinence x 8 or more (stable), and nocturia x 4-7 (stable).  Her PVR is 43mL today. Her previous PVR was 40 mL.   OAB See above.  Vaginal atrophy Patient is applying the vaginal estrogen cream 3 nights weekly when she thinks about it.  She is not having difficulty applying the cream.  She has not had vaginal burning or itching.   History of hematuria Former smoker.  Non contrast CT on 06/24/2018 revealed normal adrenal glands. No renal calculi or hydronephrosis. Left-sided calcifications including on 46/2 are felt to be vascular, adjacent to the mid ureter. No hydroureter. A left pelvic calcification is positioned inferior to the bladder. No bladder stones are seen.  RUS on 06/24/2018 was negative.  Cysto on 06/25/2018 with Dr. Richardo Hanks was negative.  Urine cytology was negative.  She does not report any gross hematuria.  rUTI She does feel like she has a UTI. Describes her urine as having an odor and being cloudy. When she takes antibiotics, the infection clears  up and returns in about a week. She denies dysuria and describes a pressure in her suprapubic region. These are intermittent symptoms that has been going on for a couple weeks. She drinks 3 water bottles daily. She does notice some dark brown discharge sometimes. She is not sexually active at the moment.  CATH UA today was positive for > 30 WBC's. 3-10 RBC's and moderate bacteria.    PMH: Past Medical History:  Diagnosis Date  . Anxiety   . Asthmatic bronchitis   . COPD (chronic obstructive pulmonary disease) (HCC)   . Depression   . Diabetes mellitus   . Diverticulitis   . Diverticulosis   . Hyperlipidemia   . Hypertension   . Osteopenia   . Sleep apnea   . Stress-induced cardiomyopathy    a. echo 09/28/2014: EF 45-50%, severe HK of mid-distal anterior, apical and mid-distal inferior walls suggestive of stress induced CM, mildly dilated LA, mildly dilated PASP, mild TR, trivial pericardial effusion    Surgical History: Past Surgical History:  Procedure Laterality Date  . ABDOMINAL HYSTERECTOMY     partial  . BREAST BIOPSY Right    neg  . CARDIAC CATHETERIZATION  09/29/2014   [facility]  . NASAL SINUS SURGERY     Home Medications:  Allergies as of 01/06/2019      Reactions   Amoxicillin-pot Clavulanate    REACTION: stomach sensitive.   Daliresp [roflumilast]    Severe nausea    Erythromycin Base    REACTION: GI upset   Neurontin [gabapentin]    anxiety  Medication List       Accurate as of January 06, 2019  4:26 PM. Always use your most recent med list.        albuterol 108 (90 Base) MCG/ACT inhaler Commonly known as:  PROVENTIL HFA;VENTOLIN HFA INHALE 2 PUFFS INTO THE LUNGS EVERY 4 (FOUR) HOURS AS NEEDED FOR WHEEZING OR SHORTNESS OF BREATH.   albuterol (2.5 MG/3ML) 0.083% nebulizer solution Commonly known as:  PROVENTIL Take 3 mLs (2.5 mg total) by nebulization every 6 (six) hours as needed for wheezing or shortness of breath. J44.9   ALPRAZolam 1 MG  tablet Commonly known as:  XANAX Take 0.5 tablets (0.5 mg total) by mouth 3 (three) times daily as needed for anxiety.   buPROPion 150 MG 24 hr tablet Commonly known as:  WELLBUTRIN XL TAKE 1 TABLET BY MOUTH EVERY DAY   busPIRone 10 MG tablet Commonly known as:  BUSPAR TAKE 1 TABLET BY MOUTH THREE TIMES A DAY   conjugated estrogens vaginal cream Commonly known as:  Premarin Apply 0.5mg  (pea-sized amount)  just inside the vaginal introitus with a finger-tip on  Monday, Wednesday and Friday nights.   Fluticasone-Salmeterol 250-50 MCG/DOSE Aepb Commonly known as:  Wixela Inhub Inhale 1 puff into the lungs 2 (two) times daily.   furosemide 20 MG tablet Commonly known as:  LASIX Take 1 tablet (20 mg total) by mouth as needed for edema.   metoprolol succinate 50 MG 24 hr tablet Commonly known as:  TOPROL-XL TAKE 1 TABLET (50 MG TOTAL) BY MOUTH DAILY. TAKE WITH OR IMMEDIATELY FOLLOWING A MEAL.   omeprazole 40 MG capsule Commonly known as:  PRILOSEC Take 1 capsule (40 mg total) by mouth daily.   ondansetron 4 MG tablet Commonly known as:  ZOFRAN Take 1 tablet (4 mg total) by mouth every 8 (eight) hours as needed.   ONE TOUCH ULTRA TEST test strip Generic drug:  glucose blood 1 EACH BY OTHER ROUTE DAILY AS NEEDED FOR OTHER.   OXYGEN Inhale 2 % into the lungs at bedtime.   rosuvastatin 5 MG tablet Commonly known as:  CRESTOR TAKE 1 TABLET BY MOUTH EVERY DAY   sertraline 100 MG tablet Commonly known as:  ZOLOFT Take 100 mg by mouth 2 (two) times daily.   tiotropium 18 MCG inhalation capsule Commonly known as:  Spiriva HandiHaler Place 1 capsule (18 mcg total) into inhaler and inhale daily.   Vitamin D-3 25 MCG (1000 UT) Caps Take 2 capsules by mouth.       Allergies:  Allergies  Allergen Reactions  . Amoxicillin-Pot Clavulanate     REACTION: stomach sensitive.  Lenice Llamas [Roflumilast]     Severe nausea   . Erythromycin Base     REACTION: GI upset  .  Neurontin [Gabapentin]     anxiety    Family History: Family History  Problem Relation Age of Onset  . Diabetes Daughter   . Kidney disease Daughter   . Lung cancer Mother        smoked  . COPD Mother        smoked  . COPD Father        smoked  . Prostate cancer Father   . Kidney failure Father   . Breast cancer Maternal Aunt   . Diabetes Maternal Grandmother   . Breast cancer Maternal Grandmother   . Cancer Cousin        colon   Social History:  reports that she quit smoking about 2 years ago.  Her smoking use included cigarettes. She has a 10.00 pack-year smoking history. She has never used smokeless tobacco. She reports that she does not drink alcohol or use drugs.  ROS: UROLOGY Frequent Urination?: Yes Hard to postpone urination?: Yes Burning/pain with urination?: No Get up at night to urinate?: Yes Leakage of urine?: Yes Urine stream starts and stops?: Yes Trouble starting stream?: Yes Do you have to strain to urinate?: Yes Blood in urine?: No Urinary tract infection?: No Sexually transmitted disease?: No Injury to kidneys or bladder?: No Painful intercourse?: No Weak stream?: No Currently pregnant?: No Vaginal bleeding?: No Last menstrual period?: n  Gastrointestinal Nausea?: Yes Vomiting?: No Indigestion/heartburn?: Yes Diarrhea?: No Constipation?: No  Constitutional Fever: No Night sweats?: No Weight loss?: No Fatigue?: Yes  Skin Skin rash/lesions?: No Itching?: Yes  Eyes Blurred vision?: No Double vision?: No  Ears/Nose/Throat Sore throat?: Yes Sinus problems?: No  Hematologic/Lymphatic Swollen glands?: No Easy bruising?: No  Cardiovascular Leg swelling?: No Chest pain?: No  Respiratory Cough?: Yes Shortness of breath?: Yes  Endocrine Excessive thirst?: Yes  Musculoskeletal Back pain?: No Joint pain?: No  Neurological Headaches?: No Dizziness?: Yes  Psychologic Depression?: Yes Anxiety?: Yes  Physical Exam: BP  (!) 150/84   Pulse 69   Resp 19   Ht 5\' 4"  (1.626 m)   Wt 156 lb (70.8 kg)   BMI 26.78 kg/m   Constitutional:  Well nourished. Alert and oriented, No acute distress. HEENT: [place] AT, moist mucus membranes.  Trachea midline, no masses. Cardiovascular: No clubbing, cyanosis, or edema. Respiratory: Normal respiratory effort, no increased work of breathing. GU: No CVA tenderness.  No bladder fullness or masses. Atrophic external genitalia, sparse pubic hair distribution, no lesions.  Normal urethral meatus, no lesions, no prolapse, no discharge.   No urethral masses, tenderness and/or tenderness. No bladder fullness, tenderness or masses. pale vagina mucosa, poor estrogen effect, no discharge, no lesions, poor pelvic support. Skin: No rashes, bruises or suspicious lesions. Neurologic: Grossly intact, no focal deficits, moving all 4 extremities. Psychiatric: Normal mood and affect.   Laboratory Data: Urinalysis Her CATH UA positive for >30 WBC, 3-10 RBC, and moderate bacteria.  I have reviewed the labs.  Pertinent Imaging Results for orders placed or performed in visit on 01/06/19  Bladder Scan (Post Void Residual) in office  Result Value Ref Range   Scan Result 43    In and Out Catheterization Patient is present today for a I & O catheterization due to an UTI. Patient was cleaned and prepped in a sterile fashion with betadine and Lidocaine 2% jelly was instilled into the urethra.  A 14 FR cath was inserted no complications were noted , 40 ml of urine return was noted, urine was yellow hazy in color. A clean urine sample was collected for UA and culture. Bladder was drained  And catheter was removed with out difficulty.    Preformed by: Michiel Cowboy, PA-C  Assessment & Plan:    1.Mixed incontinence PVR today is 43 mL, worsened  Patient reports no change in symptoms on Myrbetriq. This is likely do to use in conjunction with Lasix. Continue Myrbetriq 50 mg. Advised patient not to stop  taking her Lasix unless her cardiologist approves.  F/u with Dr. Valentino Saxon for evaluation and management  pessary   RTC in 1 month for PVR and OAB questionnaire.  2. OAB Pt complaining of suprapubic pressure UA suspicious for infection, will send for culture  Will hold her from prescribing antibiotics until urine  culture results return    3. History of hematuria Hematuria work up completed in 06/2018 - Non contrast CT, RUS, cysto and cytology were negative No report of gross hematuria      4. Vaginal atrophy Continue vaginal estrogen cream 3 nights weekly  5. rUTI  Her UA positive for >30 WBC, 3-10 RBC, and moderate bacteria.  Cath UA, urine culture - pending   Return for pending urine culture .  Hulan FrayMcGowan, Mildreth Reek A, PA-C   [facility] [address] [place], [place] [address] [phone]  I, Donne HazelNethusan Sivanesan, am acting as a Neurosurgeonscribe for [employer] CorporationShannon Gwyndolyn Guilford PA-C,  I have reviewed the above documentation for accuracy and completeness, and I agree with the above.    Michiel CowboyShannon Caydence Koenig, PA-C

## 2019-01-09 ENCOUNTER — Encounter: Payer: Self-pay | Admitting: Internal Medicine

## 2019-01-09 LAB — CULTURE, URINE COMPREHENSIVE

## 2019-01-10 ENCOUNTER — Telehealth: Payer: Self-pay

## 2019-01-10 ENCOUNTER — Other Ambulatory Visit: Payer: Self-pay | Admitting: Cardiovascular Disease

## 2019-01-10 DIAGNOSIS — N390 Urinary tract infection, site not specified: Secondary | ICD-10-CM

## 2019-01-10 MED ORDER — SULFAMETHOXAZOLE-TRIMETHOPRIM 800-160 MG PO TABS: 1.0000 | ORAL_TABLET | Freq: Two times a day (BID) | ORAL | 0 refills | Status: AC

## 2019-01-10 NOTE — Telephone Encounter (Signed)
-----   Message from Harle Battiest, PA-C sent at 01/10/2019  8:20 AM EDT ----- Please let Mrs. Rosencrantz know that her urine culture was positive for infection and she needs to start Septra DS, BID x 7 days.  I would like to see her in one month for follow up.

## 2019-01-10 NOTE — Telephone Encounter (Signed)
Left detailed message per DPR on patients home phone with results and Rx sent to pharmacy

## 2019-01-12 ENCOUNTER — Other Ambulatory Visit: Payer: Medicare Other | Admitting: Orthotics

## 2019-01-13 ENCOUNTER — Ambulatory Visit: Payer: Medicare Other | Admitting: Obstetrics and Gynecology

## 2019-01-13 ENCOUNTER — Encounter

## 2019-01-13 ENCOUNTER — Other Ambulatory Visit: Payer: Self-pay

## 2019-01-13 ENCOUNTER — Encounter: Payer: Self-pay | Admitting: Obstetrics and Gynecology

## 2019-01-13 ENCOUNTER — Other Ambulatory Visit: Payer: Self-pay | Admitting: Obstetrics and Gynecology

## 2019-01-13 VITALS — BP 111/64 | HR 80 | Ht 64.0 in | Wt 154.6 lb

## 2019-01-13 DIAGNOSIS — J449 Chronic obstructive pulmonary disease, unspecified: Secondary | ICD-10-CM

## 2019-01-13 DIAGNOSIS — E119 Type 2 diabetes mellitus without complications: Secondary | ICD-10-CM

## 2019-01-13 DIAGNOSIS — N390 Urinary tract infection, site not specified: Secondary | ICD-10-CM

## 2019-01-13 DIAGNOSIS — N3945 Continuous leakage: Secondary | ICD-10-CM

## 2019-01-13 DIAGNOSIS — N3946 Mixed incontinence: Secondary | ICD-10-CM

## 2019-01-13 DIAGNOSIS — N952 Postmenopausal atrophic vaginitis: Secondary | ICD-10-CM

## 2019-01-13 DIAGNOSIS — E663 Overweight: Secondary | ICD-10-CM

## 2019-01-13 NOTE — Progress Notes (Signed)
Pt is present today for pessary fitting. Pt stated that she is having urinating leaking sometimes she stated that she do not even know that she needs to urinate. Pt stated having to make several  bathroom trips throughout the day.

## 2019-01-13 NOTE — Progress Notes (Signed)
Subjective:     Tami Skinner is a 73 y.o. female who was referred by [facility] Michiel Cowboy, [place]) for possible pessary fitting. She has a PMH of DM (which she notes is well controlled) and COPD requiring continuous oxygen.  She has a history of mixed urinary incontinence that has not been managed with Myrbetric, as well as hematuria and recurrent UTI's. This has been present for 5 months. She leaks urine with coughing, lifting, standing, with urge, during the night. Patient describes the symptoms as the urge to urinate recurs again shortly following micturition, urine leakage with coughing/heavy physical activity, urine leaking unpredictably and voiding small amounts. Factors associated with symptoms include: none known. Evaluation to date includes UA/CS: positive (recently treated fr or UTI).   The following portions of the patient's history were reviewed and updated as appropriate: She  has a past medical history of Anxiety, Asthmatic bronchitis, COPD (chronic obstructive pulmonary disease) (HCC), Depression, Diabetes mellitus, Diverticulitis, Diverticulosis, Hyperlipidemia, Hypertension, Osteopenia, Sleep apnea, and Stress-induced cardiomyopathy.  She  has a past surgical history that includes Abdominal hysterectomy; Nasal sinus surgery; Cardiac catheterization (09/29/2014); and Breast biopsy (Right).   Her family history includes Breast cancer in her maternal aunt and maternal grandmother; COPD in her father and mother; Cancer in her cousin; Diabetes in her daughter and maternal grandmother; Kidney disease in her daughter; Kidney failure in her father; Lung cancer in her mother; Prostate cancer in her father.   She  reports that she quit smoking about 2 years ago. Her smoking use included cigarettes. She has a 10.00 pack-year smoking history. She has never used smokeless tobacco. She reports that she does not drink alcohol or use drugs. Current Outpatient Medications  on File Prior to Visit  Medication Sig Dispense Refill  . albuterol (PROVENTIL HFA;VENTOLIN HFA) 108 (90 Base) MCG/ACT inhaler INHALE 2 PUFFS INTO THE LUNGS EVERY 4 (FOUR) HOURS AS NEEDED FOR WHEEZING OR SHORTNESS OF BREATH. 18 Inhaler 6  . albuterol (PROVENTIL) (2.5 MG/3ML) 0.083% nebulizer solution Take 3 mLs (2.5 mg total) by nebulization every 6 (six) hours as needed for wheezing or shortness of breath. J44.9 75 mL 12  . ALPRAZolam (XANAX) 1 MG tablet Take 0.5 tablets (0.5 mg total) by mouth 3 (three) times daily as needed for anxiety. 50 tablet 3  . buPROPion (WELLBUTRIN XL) 150 MG 24 hr tablet TAKE 1 TABLET BY MOUTH EVERY DAY 90 tablet 1  . busPIRone (BUSPAR) 10 MG tablet TAKE 1 TABLET BY MOUTH THREE TIMES A DAY 270 tablet 0  . Cholecalciferol (VITAMIN D-3) 1000 UNITS CAPS Take 2 capsules by mouth.     . conjugated estrogens (PREMARIN) vaginal cream Apply 0.5mg  (pea-sized amount)  just inside the vaginal introitus with a finger-tip on  Monday, Wednesday and Friday nights. 30 g 12  . Fluticasone-Salmeterol (WIXELA INHUB) 250-50 MCG/DOSE AEPB Inhale 1 puff into the lungs 2 (two) times daily. 60 each 11  . furosemide (LASIX) 20 MG tablet Take 1 tablet (20 mg total) by mouth as needed for edema. 30 tablet 5  . metoprolol succinate (TOPROL-XL) 50 MG 24 hr tablet TAKE 1 TABLET (50 MG TOTAL) BY MOUTH DAILY. TAKE WITH OR IMMEDIATELY FOLLOWING A MEAL. 90 tablet 0  . omeprazole (PRILOSEC) 40 MG capsule Take 1 capsule (40 mg total) by mouth daily. 30 capsule 3  . ondansetron (ZOFRAN) 4 MG tablet Take 1 tablet (4 mg total) by mouth every 8 (eight) hours as needed. 90 tablet 2  .  ONE TOUCH ULTRA TEST test strip 1 EACH BY OTHER ROUTE DAILY AS NEEDED FOR OTHER. 25 each 5  . OXYGEN-HELIUM IN Inhale 2 % into the lungs at bedtime.      . rosuvastatin (CRESTOR) 5 MG tablet TAKE 1 TABLET BY MOUTH EVERY DAY 90 tablet 0  . sertraline (ZOLOFT) 100 MG tablet Take 100 mg by mouth 2 (two) times daily.    Marland Kitchen  sulfamethoxazole-trimethoprim (BACTRIM DS,SEPTRA DS) 800-160 MG tablet Take 1 tablet by mouth 2 (two) times daily for 7 days. 14 tablet 0  . tiotropium (SPIRIVA HANDIHALER) 18 MCG inhalation capsule Place 1 capsule (18 mcg total) into inhaler and inhale daily. 30 capsule 12   No current facility-administered medications on file prior to visit.    She is allergic to amoxicillin-pot clavulanate; daliresp [roflumilast]; erythromycin base; and neurontin [gabapentin]..  Review of Systems Pertinent items noted in HPI and remainder of comprehensive ROS otherwise negative.    Objective:    BP 111/64   Pulse 80   Ht 5\' 4"  (1.626 m)   Wt 154 lb 9.6 oz (70.1 kg)   BMI 26.54 kg/m  General appearance: alert and no distress, overweight Abdomen: soft, non-tender; bowel sounds normal; no masses,  no organomegaly Pelvic: external genitalia normal, rectovaginal septum normal.  Vagina without discharge, mild atrophy present.  Cystocele present (Grade 1-2), leakage of urine noted on Valsalva. Uterus and cervix surgically absent.  Adnexae non-palpable, nontender bilaterally.  Extremities: extremities normal, atraumatic, no cyanosis or edema Neurologic: Grossly normal    Labs:  Lab Results  Component Value Date   HGBA1C 5.9 (H) 12/31/2018    Assessment:    Incontinence (mixed).  Severity = moderate . Also with continuous leakage.   DM (controlled) COPD Overweight H/o recurrent UTI's  Vaginal atrophy  Plan:    The causes of incontinence and plan for evaluation and treatment were discussed. Appropriate educational materials were distributed. Discussed Kegel exercised in detail. Discussed planned voiding. Pessary discussed today, patient ok with fitting.  Fitting performed today. Will order Size 2 pessary ring with support.   Continue use of Myrbetric for now.   COPD likely a contributing to leaking.  Continue Premarin for vaginal (and urethral) atrophy. Advised to use internally as well as  at the urethral meatus. For the next week until pessary fitting.  DM well controlled.  RTC in 1 week for pessary insertion.    Hildred Laser, MD [facility]

## 2019-01-16 ENCOUNTER — Encounter: Payer: Self-pay | Admitting: Obstetrics and Gynecology

## 2019-01-17 ENCOUNTER — Telehealth: Payer: Self-pay | Admitting: Student

## 2019-01-17 NOTE — Telephone Encounter (Signed)
Palliative Medicine NP left message left to follow up with patient.

## 2019-01-25 ENCOUNTER — Encounter: Payer: Medicare Other | Admitting: Obstetrics and Gynecology

## 2019-02-10 ENCOUNTER — Other Ambulatory Visit: Payer: Self-pay | Admitting: Internal Medicine

## 2019-02-10 DIAGNOSIS — E119 Type 2 diabetes mellitus without complications: Secondary | ICD-10-CM

## 2019-02-10 DIAGNOSIS — R110 Nausea: Secondary | ICD-10-CM

## 2019-02-10 NOTE — Telephone Encounter (Signed)
Zofran last filled 06/2018 #90 with 2 refills... please advise

## 2019-02-15 ENCOUNTER — Encounter: Payer: Self-pay | Admitting: Internal Medicine

## 2019-02-16 MED ORDER — BUSPIRONE HCL 10 MG PO TABS: 10.0000 mg | ORAL_TABLET | Freq: Three times a day (TID) | ORAL | 0 refills | Status: DC

## 2019-02-24 ENCOUNTER — Other Ambulatory Visit: Payer: Self-pay | Admitting: Internal Medicine

## 2019-03-01 ENCOUNTER — Other Ambulatory Visit: Payer: Self-pay | Admitting: Internal Medicine

## 2019-03-01 NOTE — Telephone Encounter (Signed)
Pulm last filled Xanax 01/2019 with 2 refills... please advise

## 2019-03-01 NOTE — Telephone Encounter (Signed)
She shouldn't need a refill then.

## 2019-03-01 NOTE — Telephone Encounter (Signed)
I spoke to [place] at [facility] and she states they have the Rx from Pulm prescribed 01/02/2019 with 3 refills... should not need refills until July 2020

## 2019-03-14 ENCOUNTER — Other Ambulatory Visit: Payer: Self-pay | Admitting: Internal Medicine

## 2019-03-14 DIAGNOSIS — B372 Candidiasis of skin and nail: Secondary | ICD-10-CM

## 2019-03-14 NOTE — Telephone Encounter (Signed)
Last filled 09/2018...Marland Kitchen please advise

## 2019-03-21 ENCOUNTER — Other Ambulatory Visit: Payer: Self-pay | Admitting: Internal Medicine

## 2019-03-21 ENCOUNTER — Ambulatory Visit: Payer: Medicare Other | Admitting: Podiatry

## 2019-03-25 ENCOUNTER — Encounter: Payer: Self-pay | Admitting: Internal Medicine

## 2019-03-29 ENCOUNTER — Encounter: Payer: Self-pay | Admitting: Internal Medicine

## 2019-04-18 ENCOUNTER — Telehealth: Payer: Self-pay | Admitting: Pulmonary Disease

## 2019-04-18 NOTE — Telephone Encounter (Signed)
Recommend phone visit.  

## 2019-04-18 NOTE — Telephone Encounter (Signed)
Called patient for COVID-19 pre-screening for in office visit.  Have you recently traveled any where out of the local area in the last 2 weeks? NO Have you been in close contact with a person diagnosed with COVID-19 within the last 2 weeks? NO   Do you currently have any of the following symptoms? If so, when did they start? Cough (YES- dry cough-a week)                   Diarrhea Joint Pain Fever      Muscle Pain Red eyes Shortness of breath (YES- a week)    Abdominal painVomiting Loss of smell    Rash  Sore Throat  Headache (YES- week)     WeaknessBruising or bleeding  Runny nose, scratchy throat (a week)   Okay to proceed with visit. (date)  / Needs to reschedule visit. (date)

## 2019-04-18 NOTE — Telephone Encounter (Signed)
Pt is aware. Rescheduled for phone visit.

## 2019-04-19 ENCOUNTER — Encounter: Payer: Self-pay | Admitting: Pulmonary Disease

## 2019-04-19 ENCOUNTER — Ambulatory Visit (INDEPENDENT_AMBULATORY_CARE_PROVIDER_SITE_OTHER): Payer: Medicare Other | Admitting: Pulmonary Disease

## 2019-04-19 DIAGNOSIS — J449 Chronic obstructive pulmonary disease, unspecified: Secondary | ICD-10-CM | POA: Diagnosis not present

## 2019-04-19 DIAGNOSIS — J441 Chronic obstructive pulmonary disease with (acute) exacerbation: Secondary | ICD-10-CM | POA: Diagnosis not present

## 2019-04-19 DIAGNOSIS — J9611 Chronic respiratory failure with hypoxia: Secondary | ICD-10-CM | POA: Diagnosis not present

## 2019-04-19 MED ORDER — PREDNISONE 20 MG PO TABS: 40.0000 mg | ORAL_TABLET | Freq: Every day | ORAL | 0 refills | Status: AC

## 2019-04-19 NOTE — Progress Notes (Addendum)
PULMONARY OFFICE FOLLOW UP NOTE  Requesting MD/Service: Self Date of initial consultation: 09/29/16 Reason for consultation: COPD, smoker  PT PROFILE: [age] F former smoker (quit October 2017) previously followed by Dr Kendrick FriesMcQuaid for lung nodule and moderate COPD. Maintained on Spiriva, Advair, PRN albuterol with class III dyspnea  PROBLEMS: Chronic hypoxemic respiratory failure Very severe COPD Obstructive sleep apnea  DATA: Spirometry 02/03/12: Moderate obstruction (FEV1 1.21 liters, 49% pred) CT chest 09/27/14: Stable right lower lobe pulmonary nodule over over 2 year interval consistent benign etiology PFTs 11/11/16: mod-severe obstruction (FEV1 0.95 liters, 44% pred), mild restriction, severe reduction in DLCO. Lung volumes are probably underestimated by inadequate test performance CXR 11/28/16: hyperinflation. NACPD PFTs 09/17/17: Moderate obstruction (FEV1 1.20 L, 52% predicted.  Lung volumes normal.  DLCO markedly reduced at 43% predicted.  Borderline improvement with bronchodilator therapy CT chest  INTERVAL: Last seen by me 12/30/18.  No major pulmonary events in the interim.    Virtual Visit via Telephone Note I connected with Tami Skinner on 05/04/19 at  1:45 PM EDT by telephone and verified that I am speaking with the correct person using two identifiers. I discussed the limitations, risks, security and privacy concerns of performing an evaluation and management service by telephone and the availability of in person appointments. I also discussed with the patient that there may be a patient responsible charge related to this service. The patient expressed understanding and agreed to proceed. I discussed the assessment and treatment plan with the patient. The patient was provided an opportunity to ask questions and all were answered. The patient agreed with the plan and demonstrated an understanding of the instructions. The patient was advised to call back or seek an in-person  evaluation if the symptoms worsen or if the condition fails to improve as anticipated.   SUBJ: This is a scheduled follow up that was performed remotely due to the coronavirus pandemic.  She reports scratchy throat, runny nose, dry cough,chest tightness x 1 week.  She denies fever, purulent sputum.  She reports no known no sick contacts.  She is using albuterol 1-3x per day which helps chest tightness and shortness of breath somewhat.  She remains on MontserratWixela and Spiriva which she is using compliantly..  She has a home pulse oximeter and reports that her SpO2 is 94% on 2-3 LPM [place].    MEDICATIONS: I have reviewed all medications and confirmed regimen as documented  There were no vitals filed for this visit.  SpO2 reportedly 94% on 3 LPM (pulse)  EXAM:  Due to the remote nature of this encounter, exam could not be performed  DATA:   BMP Latest Ref Rng & Units 12/31/2018 10/05/2018 11/09/2017  Glucose 65 - 99 mg/dL 91 161(W151(H) 960(A117(H)  BUN 7 - 25 mg/dL 25 22 20   Creatinine 0.60 - 0.93 mg/dL 5.40(J1.17(H) 8.11(B1.32(H) 1.470.94  BUN/Creat Ratio 6 - 22 (calc) 21 - -  Sodium 135 - 146 mmol/L 140 140 135  Potassium 3.5 - 5.3 mmol/L 4.8 4.2 4.5  Chloride 98 - 110 mmol/L 99 93(L) 98(L)  CO2 20 - 32 mmol/L 32 37(H) 29  Calcium 8.6 - 10.4 mg/dL 9.9 9.7 9.7    CBC Latest Ref Rng & Units 12/31/2018 10/05/2018 11/09/2017  WBC 3.8 - 10.8 Thousand/uL 6.0 6.2 5.9  Hemoglobin 11.7 - 15.5 g/dL 82.912.3 11.9(L) 13.6  Hematocrit 35.0 - 45.0 % 38.8 38.9 41.5  Platelets 140 - 400 Thousand/uL 217 258 232    CXR 12/25/18: Moderate kyphosis, emphysematous  changes, no acute findings  IMPRESSION:     ICD-10-CM   1. COPD, very severe ([place])  J44.9   2. Chronic hypoxemic respiratory failure (HCC)  J96.11   3. COPD exacerbation (HCC)  J44.1     PLAN:  Continue Wixela and Spiriva inhalers Continue oxygen therapy is close to 24 hours/day as possible Continue albuterol inhaler as needed Prednisone 40 mg daily for 5 days.  Prescription  entered  Follow-up in 2-3 weeks to ensure that current COPD exacerbation has resolved.  She is to call this office later this week if she does not believe that her symptoms are improving within the next 2-3 days.   Merton Border, MD PCCM service Mobile [phone] Pager [phone]

## 2019-04-19 NOTE — Patient Instructions (Signed)
Continue Wixela and Spiriva inhalers Continue oxygen therapy is close to 24 hours/day as possible Continue albuterol inhaler as needed New prescription: Prednisone 40 mg daily for 5 days.  Prescription entered  Follow-up in 2-3 weeks to ensure that current COPD exacerbation has resolved

## 2019-04-24 ENCOUNTER — Encounter: Payer: Self-pay | Admitting: Internal Medicine

## 2019-04-24 DIAGNOSIS — F329 Major depressive disorder, single episode, unspecified: Secondary | ICD-10-CM

## 2019-04-24 DIAGNOSIS — F32A Depression, unspecified: Secondary | ICD-10-CM

## 2019-05-04 ENCOUNTER — Ambulatory Visit (INDEPENDENT_AMBULATORY_CARE_PROVIDER_SITE_OTHER): Payer: Medicare Other | Admitting: Pulmonary Disease

## 2019-05-04 ENCOUNTER — Encounter: Payer: Self-pay | Admitting: Pulmonary Disease

## 2019-05-04 DIAGNOSIS — J449 Chronic obstructive pulmonary disease, unspecified: Secondary | ICD-10-CM | POA: Diagnosis not present

## 2019-05-04 DIAGNOSIS — J441 Chronic obstructive pulmonary disease with (acute) exacerbation: Secondary | ICD-10-CM

## 2019-05-04 DIAGNOSIS — J9611 Chronic respiratory failure with hypoxia: Secondary | ICD-10-CM

## 2019-05-04 NOTE — Progress Notes (Signed)
PULMONARY OFFICE FOLLOW UP NOTE  Requesting MD/Service: Self Date of initial consultation: 09/29/16 Reason for consultation: COPD, smoker  PT PROFILE: [age] F former smoker (quit October 2017) previously followed by Dr Kendrick FriesMcQuaid for lung nodule and moderate COPD. Maintained on Spiriva, Advair, PRN albuterol with class III dyspnea  PROBLEMS: Chronic hypoxemic respiratory failure Very severe COPD Obstructive sleep apnea  DATA: Spirometry 02/03/12: Moderate obstruction (FEV1 1.21 liters, 49% pred) CT chest 09/27/14: Stable right lower lobe pulmonary nodule over over 2 year interval consistent benign etiology PFTs 11/11/16: mod-severe obstruction (FEV1 0.95 liters, 44% pred), mild restriction, severe reduction in DLCO. Lung volumes are probably underestimated by inadequate test performance CXR 11/28/16: hyperinflation. NACPD PFTs 09/17/17: Moderate obstruction (FEV1 1.20 L, 52% predicted.  Lung volumes normal.  DLCO markedly reduced at 43% predicted.  Borderline improvement with bronchodilator therapy  INTERVAL: Last seen by me 12/30/18. Last encounter was remote (telephone) on 04/19/19   Virtual Visit via Telephone Note I connected with Tami Skinner on 05/04/19 at  1:45 PM EDT by telephone and verified that I am speaking with the correct person using two identifiers. I discussed the limitations, risks, security and privacy concerns of performing an evaluation and management service by telephone and the availability of in person appointments. I also discussed with the patient that there may be a patient responsible charge related to this service. The patient expressed understanding and agreed to proceed. I discussed the assessment and treatment plan with the patient. The patient was provided an opportunity to ask questions and all were answered. The patient agreed with the plan and demonstrated an understanding of the instructions. The patient was advised to call back or seek an in-person  evaluation if the symptoms worsen or if the condition fails to improve as anticipated.   SUBJ: This is a scheduled telephoen follow up. I prescribed prednisone 06/16 for COPD exac. She is better and nearly back to her baseline.  However, she is struggling with the current heat and humidity.  She is compliant with Wixela and Spiriva inhalers.  She is compliant with oxygen therapy.  She uses albuterol inhaler as needed.  She denies fever, pleuritic chest pain, purulent sputum, lower extremity edema, calf tenderness.   MEDICATIONS: I have reviewed all medications and confirmed regimen as documented  There were no vitals filed for this visit.  SpO2 reportedly 94% on 3 LPM (pulse)  EXAM:  Due to the remote nature of this encounter, exam could not be performed  DATA:   BMP Latest Ref Rng & Units 12/31/2018 10/05/2018 11/09/2017  Glucose 65 - 99 mg/dL 91 161(W151(H) 960(A117(H)  BUN 7 - 25 mg/dL 25 22 20   Creatinine 0.60 - 0.93 mg/dL 5.40(J1.17(H) 8.11(B1.32(H) 1.470.94  BUN/Creat Ratio 6 - 22 (calc) 21 - -  Sodium 135 - 146 mmol/L 140 140 135  Potassium 3.5 - 5.3 mmol/L 4.8 4.2 4.5  Chloride 98 - 110 mmol/L 99 93(L) 98(L)  CO2 20 - 32 mmol/L 32 37(H) 29  Calcium 8.6 - 10.4 mg/dL 9.9 9.7 9.7    CBC Latest Ref Rng & Units 12/31/2018 10/05/2018 11/09/2017  WBC 3.8 - 10.8 Thousand/uL 6.0 6.2 5.9  Hemoglobin 11.7 - 15.5 g/dL 82.912.3 11.9(L) 13.6  Hematocrit 35.0 - 45.0 % 38.8 38.9 41.5  Platelets 140 - 400 Thousand/uL 217 258 232    CXR: No new film  IMPRESSION:     ICD-10-CM   1. COPD, very severe (HCC)  J44.9   2. Chronic hypoxemic respiratory failure (HCC)  J96.11   3. COPD exacerbation, resolved ([place])  J44.1     PLAN:  Continue Wixela and Spiriva inhalers Continue oxygen therapy is close to 24 hours/day as possible Continue albuterol inhaler as needed   Follow-up in 2-3 months.  Call sooner if needed   Merton Border, MD PCCM service Mobile [phone] Pager [phone]

## 2019-05-04 NOTE — Patient Instructions (Signed)
Continue Wixela and Spiriva inhalers Continue oxygen therapy is close to 24 hours/day as possible Continue albuterol inhaler as needed  Follow-up in 2 to 3 months.  Call sooner if needed

## 2019-05-05 ENCOUNTER — Other Ambulatory Visit: Payer: Self-pay | Admitting: Internal Medicine

## 2019-05-05 NOTE — Telephone Encounter (Signed)
This is not showing on current list and there are 2 different sig Hx.... please advise that this is appropriate

## 2019-05-07 ENCOUNTER — Emergency Department (HOSPITAL_COMMUNITY)
Admission: EM | Admit: 2019-05-07 | Discharge: 2019-05-07 | Disposition: A | Discharge status: Home / Self Care | Payer: Medicare Other | Attending: Emergency Medicine | Admitting: Emergency Medicine

## 2019-05-07 ENCOUNTER — Other Ambulatory Visit: Payer: Self-pay

## 2019-05-07 ENCOUNTER — Encounter (HOSPITAL_COMMUNITY): Payer: Self-pay | Admitting: Emergency Medicine

## 2019-05-07 ENCOUNTER — Emergency Department (HOSPITAL_COMMUNITY): Payer: Medicare Other

## 2019-05-07 DIAGNOSIS — J441 Chronic obstructive pulmonary disease with (acute) exacerbation: Secondary | ICD-10-CM | POA: Diagnosis not present

## 2019-05-07 DIAGNOSIS — I10 Essential (primary) hypertension: Secondary | ICD-10-CM | POA: Insufficient documentation

## 2019-05-07 DIAGNOSIS — Z20828 Contact with and (suspected) exposure to other viral communicable diseases: Secondary | ICD-10-CM | POA: Insufficient documentation

## 2019-05-07 DIAGNOSIS — Z79899 Other long term (current) drug therapy: Secondary | ICD-10-CM | POA: Insufficient documentation

## 2019-05-07 DIAGNOSIS — Z87891 Personal history of nicotine dependence: Secondary | ICD-10-CM | POA: Diagnosis not present

## 2019-05-07 DIAGNOSIS — R0602 Shortness of breath: Secondary | ICD-10-CM | POA: Diagnosis present

## 2019-05-07 DIAGNOSIS — I252 Old myocardial infarction: Secondary | ICD-10-CM | POA: Insufficient documentation

## 2019-05-07 DIAGNOSIS — E119 Type 2 diabetes mellitus without complications: Secondary | ICD-10-CM | POA: Diagnosis not present

## 2019-05-07 LAB — CBC WITH DIFFERENTIAL/PLATELET
Abs Immature Granulocytes: 0.01 10*3/uL (ref 0.00–0.07)
Basophils Absolute: 0 10*3/uL (ref 0.0–0.1)
Basophils Relative: 0 %
Eosinophils Absolute: 0 10*3/uL (ref 0.0–0.5)
Eosinophils Relative: 0 %
HCT: 43.8 % (ref 36.0–46.0)
Hemoglobin: 13.2 g/dL (ref 12.0–15.0)
Immature Granulocytes: 0 %
Lymphocytes Relative: 18 %
Lymphs Abs: 1 10*3/uL (ref 0.7–4.0)
MCH: 29.3 pg (ref 26.0–34.0)
MCHC: 30.1 g/dL (ref 30.0–36.0)
MCV: 97.1 fL (ref 80.0–100.0)
Monocytes Absolute: 0.4 10*3/uL (ref 0.1–1.0)
Monocytes Relative: 7 %
Neutro Abs: 4.5 10*3/uL (ref 1.7–7.7)
Neutrophils Relative %: 75 %
Platelets: 186 10*3/uL (ref 150–400)
RBC: 4.51 MIL/uL (ref 3.87–5.11)
RDW: 13 % (ref 11.5–15.5)
WBC: 6 10*3/uL (ref 4.0–10.5)
nRBC: 0 % (ref 0.0–0.2)

## 2019-05-07 LAB — COMPREHENSIVE METABOLIC PANEL
ALT: 23 U/L (ref 0–44)
AST: 22 U/L (ref 15–41)
Albumin: 4 g/dL (ref 3.5–5.0)
Alkaline Phosphatase: 53 U/L (ref 38–126)
Anion gap: 7 (ref 5–15)
BUN: 11 mg/dL (ref 8–23)
CO2: 39 mmol/L — ABNORMAL HIGH (ref 22–32)
Calcium: 10 mg/dL (ref 8.9–10.3)
Chloride: 92 mmol/L — ABNORMAL LOW (ref 98–111)
Creatinine, Ser: 0.76 mg/dL (ref 0.44–1.00)
GFR calc Af Amer: >60 mL/min (ref >60)
GFR calc non Af Amer: >60 mL/min (ref >60)
Glucose, Bld: 118 mg/dL — ABNORMAL HIGH (ref 70–99)
Potassium: 4.7 mmol/L (ref 3.5–5.1)
Sodium: 138 mmol/L (ref 135–145)
Total Bilirubin: 0.9 mg/dL (ref 0.3–1.2)
Total Protein: 6.7 g/dL (ref 6.5–8.1)

## 2019-05-07 LAB — LACTIC ACID, PLASMA: Lactic Acid, Venous: 0.7 mmol/L (ref 0.5–1.9)

## 2019-05-07 LAB — LIPASE, BLOOD: Lipase: 24 U/L (ref 11–51)

## 2019-05-07 LAB — BRAIN NATRIURETIC PEPTIDE: B Natriuretic Peptide: 254.5 pg/mL — ABNORMAL HIGH (ref 0.0–100.0)

## 2019-05-07 LAB — TROPONIN I (HIGH SENSITIVITY): Troponin I (High Sensitivity): 8 ng/L (ref <18)

## 2019-05-07 LAB — SARS CORONAVIRUS 2 BY RT PCR (HOSPITAL ORDER, PERFORMED IN ~~LOC~~ HOSPITAL LAB): SARS Coronavirus 2: NEGATIVE

## 2019-05-07 MED ORDER — PREDNISONE 20 MG PO TABS: 60.0000 mg | ORAL_TABLET | Freq: Every day | ORAL | 0 refills | Status: AC

## 2019-05-07 MED ORDER — ALBUTEROL SULFATE HFA 108 (90 BASE) MCG/ACT IN AERS
8.0000 | INHALATION_SPRAY | Freq: Once | RESPIRATORY_TRACT | Status: AC
  Administered 2019-05-07: 8 via RESPIRATORY_TRACT
  Filled 2019-05-07: qty 6.7

## 2019-05-07 MED ORDER — PREDNISONE 20 MG PO TABS
60.0000 mg | ORAL_TABLET | Freq: Once | ORAL | Status: AC
  Administered 2019-05-07: 60 mg via ORAL
  Filled 2019-05-07: qty 3

## 2019-05-07 MED ORDER — IPRATROPIUM BROMIDE HFA 17 MCG/ACT IN AERS
2.0000 | INHALATION_SPRAY | Freq: Once | RESPIRATORY_TRACT | Status: AC
  Administered 2019-05-07: 2 via RESPIRATORY_TRACT
  Filled 2019-05-07: qty 12.9

## 2019-05-07 NOTE — ED Provider Notes (Signed)
[facility] Provider Note   CSN: [id number] Arrival date & time: 05/07/19  1952    History   Chief Complaint Chief Complaint  Patient presents with  . Shortness of Breath    HPI Tami Skinner is a 73 y.o. female.     The history is provided by the patient.  Shortness of Breath Severity:  Moderate Onset quality:  Gradual Timing:  Constant Progression:  Unchanged Chronicity:  Recurrent Context: activity   Relieved by:  Inhaler Associated symptoms: wheezing   Associated symptoms: no abdominal pain, no chest pain, no cough, no ear pain, no fever, no rash, no sore throat and no vomiting   Risk factors comment:  COPD, CHF on 3L of O2   Past Medical History:  Diagnosis Date  . Anxiety   . Asthmatic bronchitis   . COPD (chronic obstructive pulmonary disease) (HCC)   . Depression   . Diabetes mellitus   . Diverticulitis   . Diverticulosis   . Hyperlipidemia   . Hypertension   . Osteopenia   . Sleep apnea   . Stress-induced cardiomyopathy    a. echo 09/28/2014: EF 45-50%, severe HK of mid-distal anterior, apical and mid-distal inferior walls suggestive of stress induced CM, mildly dilated LA, mildly dilated PASP, mild TR, trivial pericardial effusion    Patient Active Problem List   Diagnosis Date Noted  . COPD exacerbation (HCC) 10/29/2018  . NSTEMI (non-ST elevated myocardial infarction) (HCC) 10/06/2014  . Stress-induced cardiomyopathy   . Unspecified vitamin D deficiency 05/19/2014  . Dyshidrotic eczema 10/11/2013  . BPV (benign positional vertigo) 08/02/2013  . Essential hypertension, benign 03/31/2013  . Solitary pulmonary nodule 12/20/2012  . Postmenopausal estrogen deficiency 09/07/2012  . Anxiety and depression 11/24/2011  . Diabetes mellitus type 2, controlled (HCC) 09/04/2011  . COPD (chronic obstructive pulmonary disease) (HCC) 09/04/2011  . OSA (obstructive sleep apnea) 02/17/2007  . Hyperlipidemia 04/04/2003     Past Surgical History:  Procedure Laterality Date  . ABDOMINAL HYSTERECTOMY     partial  . BREAST BIOPSY Right    neg  . CARDIAC CATHETERIZATION  09/29/2014   [facility]  . NASAL SINUS SURGERY       OB History   No obstetric history on file.      Home Medications    Prior to Admission medications   Medication Sig Start Date End Date Taking? Authorizing Provider  ALPRAZolam Prudy Feeler(XANAX) 1 MG tablet Take 0.5 tablets (0.5 mg total) by mouth 3 (three) times daily as needed for anxiety. 01/02/19  Yes Merwyn KatosSimonds, David B, MD  buPROPion (WELLBUTRIN XL) 150 MG 24 hr tablet TAKE 1 TABLET BY MOUTH EVERY DAY Patient taking differently: Take 150 mg by mouth daily.  03/22/19  Yes Baity, Salvadore Oxfordegina W, NP  busPIRone (BUSPAR) 10 MG tablet Take 1 tablet (10 mg total) by mouth 3 (three) times daily. 02/16/19  Yes Baity, Salvadore Oxfordegina W, NP  ondansetron (ZOFRAN) 4 MG tablet TAKE 1 TABLET BY MOUTH EVERY 8 HOURS AS NEEDED Patient taking differently: Take 4 mg by mouth every 8 (eight) hours as needed for nausea or vomiting.  02/10/19  Yes Baity, Salvadore Oxfordegina W, NP  albuterol (PROVENTIL HFA;VENTOLIN HFA) 108 (90 Base) MCG/ACT inhaler INHALE 2 PUFFS INTO THE LUNGS EVERY 4 (FOUR) HOURS AS NEEDED FOR WHEEZING OR SHORTNESS OF BREATH. 11/22/18   Merwyn KatosSimonds, David B, MD  albuterol (PROVENTIL) (2.5 MG/3ML) 0.083% nebulizer solution Take 3 mLs (2.5 mg total) by nebulization every 6 (six) hours as needed  for wheezing or shortness of breath. J44.9 11/22/18   Merwyn KatosSimonds, David B, MD  Cholecalciferol (VITAMIN D-3) 1000 UNITS CAPS Take 2 capsules by mouth.     [provider]  clotrimazole-betamethasone (LOTRISONE) cream APPLY TO AFFECTED AREA TWICE A DAY 03/14/19   Lorre MunroeBaity, Regina W, NP  conjugated estrogens (PREMARIN) vaginal cream Apply 0.5mg  (pea-sized amount)  just inside the vaginal introitus with a finger-tip on  Monday, Wednesday and Friday nights. 05/20/18   McGowan, Carollee HerterShannon A, PA-C  Fluticasone-Salmeterol (WIXELA INHUB) 250-50 MCG/DOSE AEPB  Inhale 1 puff into the lungs 2 (two) times daily. 10/22/18   Merwyn KatosSimonds, David B, MD  furosemide (LASIX) 20 MG tablet Take 1 tablet (20 mg total) by mouth as needed for edema. 10/13/18   [place] OuchArida, Muhammad A, MD  metoprolol succinate (TOPROL-XL) 50 MG 24 hr tablet TAKE 1 TABLET (50 MG TOTAL) BY MOUTH DAILY. TAKE WITH OR IMMEDIATELY FOLLOWING A MEAL. Patient taking differently: Take 50 mg by mouth daily. Take with or immediately following a meal 01/10/19 05/07/19  [place] OuchArida, Muhammad A, MD  omeprazole (PRILOSEC) 40 MG capsule Take 1 capsule (40 mg total) by mouth daily. 12/31/18   Lorre MunroeBaity, Regina W, NP  ONE TOUCH ULTRA TEST test strip 1 EACH BY OTHER ROUTE DAILY AS NEEDED FOR OTHER. 09/01/18   Lorre MunroeBaity, Regina W, NP  OXYGEN-HELIUM IN Inhale 2 % into the lungs at bedtime.      [provider]  predniSONE (DELTASONE) 20 MG tablet Take 3 tablets (60 mg total) by mouth daily for 4 days. 05/07/19 05/11/19  Romonda Parker, DO  rosuvastatin (CRESTOR) 5 MG tablet TAKE 1 TABLET BY MOUTH EVERY DAY 02/25/19   Lorre MunroeBaity, Regina W, NP  sertraline (ZOLOFT) 100 MG tablet TAKE 2 TABLETS BY MOUTH TWICE A DAY 05/05/19   Lorre MunroeBaity, Regina W, NP  tiotropium (SPIRIVA HANDIHALER) 18 MCG inhalation capsule Place 1 capsule (18 mcg total) into inhaler and inhale daily. 10/22/18   Merwyn KatosSimonds, David B, MD    Family History Family History  Problem Relation Age of Onset  . Diabetes Daughter   . Kidney disease Daughter   . Lung cancer Mother        smoked  . COPD Mother        smoked  . COPD Father        smoked  . Prostate cancer Father   . Kidney failure Father   . Breast cancer Maternal Aunt   . Diabetes Maternal Grandmother   . Breast cancer Maternal Grandmother   . Cancer Cousin        colon    Social History Social History   Tobacco Use  . Smoking status: Former Smoker    Packs/day: 0.25    Years: 40.00    Pack years: 10.00    Types: Cigarettes    Quit date: 09/01/2016    Years since quitting: 2.6  . Smokeless tobacco: Never  Used  . Tobacco comment: Uses nicotrol inhaler on occasion  Substance Use Topics  . Alcohol use: No  . Drug use: No     Allergies   Amoxicillin-pot clavulanate, Daliresp [roflumilast], Erythromycin base, and Neurontin [gabapentin]   Review of Systems Review of Systems  Constitutional: Negative for chills and fever.  HENT: Negative for ear pain and sore throat.   Eyes: Negative for pain and visual disturbance.  Respiratory: Positive for shortness of breath and wheezing. Negative for cough.   Cardiovascular: Negative for chest pain and palpitations.  Gastrointestinal: Negative for abdominal pain  and vomiting.  Genitourinary: Positive for dysuria. Negative for hematuria.  Musculoskeletal: Negative for arthralgias and back pain.  Skin: Negative for color change and rash.  Neurological: Negative for seizures and syncope.  All other systems reviewed and are negative.    Physical Exam Updated Vital Signs  ED Triage Vitals  Enc Vitals Group     BP 05/07/19 1954 131/66     Pulse Rate 05/07/19 1954 72     Resp 05/07/19 1954 14     Temp 05/07/19 1954 99 F (37.2 C)     Temp Source 05/07/19 1954 Oral     SpO2 05/07/19 1954 100 %     Weight --      Height --      Head Circumference --      Peak Flow --      Pain Score 05/07/19 1955 0     Pain Loc --      Pain Edu? --      Excl. in [place]? --     Physical Exam Vitals signs and nursing note reviewed.  Constitutional:      General: She is not in acute distress.    Appearance: She is well-developed.  HENT:     Head: Normocephalic and atraumatic.     Mouth/Throat:     Mouth: Mucous membranes are moist.  Eyes:     Conjunctiva/sclera: Conjunctivae normal.     Pupils: Pupils are equal, round, and reactive to light.  Neck:     Musculoskeletal: Normal range of motion and neck supple.  Cardiovascular:     Rate and Rhythm: Normal rate and regular rhythm.     Pulses: Normal pulses.     Heart sounds: Normal heart sounds. No  murmur.  Pulmonary:     Effort: Pulmonary effort is normal. No respiratory distress.     Breath sounds: Decreased breath sounds and wheezing present.  Abdominal:     Palpations: Abdomen is soft.     Tenderness: There is no abdominal tenderness.  Musculoskeletal:     Right lower leg: No edema.     Left lower leg: No edema.  Skin:    General: Skin is warm and dry.     Capillary Refill: Capillary refill takes less than 2 seconds.  Neurological:     General: No focal deficit present.     Mental Status: She is alert.      ED Treatments / Results  Labs (all labs ordered are listed, but only abnormal results are displayed) Labs Reviewed  COMPREHENSIVE METABOLIC PANEL - Abnormal; Notable for the following components:      Result Value   Chloride 92 (*)    CO2 39 (*)    Glucose, Bld 118 (*)    All other components within normal limits  BRAIN NATRIURETIC PEPTIDE - Abnormal; Notable for the following components:   B Natriuretic Peptide 254.5 (*)    All other components within normal limits  SARS CORONAVIRUS 2 (HOSPITAL ORDER, [facility])  CBC WITH DIFFERENTIAL/PLATELET  LIPASE, BLOOD  TROPONIN I (HIGH SENSITIVITY)  LACTIC ACID, PLASMA  LACTIC ACID, PLASMA    EKG None  Radiology Dg Chest Portable 1 View  Result Date: 05/07/2019 CLINICAL DATA:  Shortness of breath. Chest tightness. EXAM: PORTABLE CHEST 1 VIEW COMPARISON:  12/25/2017 FINDINGS: Patient's chin obscures the apices. The cardiomediastinal contours are normal. Mild chronic hyperinflation. Linear scarring the right lung base. Pulmonary vasculature is normal. No consolidation, pleural effusion, or pneumothorax.  No acute osseous abnormalities are seen. IMPRESSION: Mild chronic hyperinflation without acute abnormality. Electronically Signed   By: Narda RutherfordMelanie  Sanford M.D.   On: 05/07/2019 20:57    Procedures Procedures (including critical care time)  Medications Ordered in ED Medications   albuterol (VENTOLIN HFA) 108 (90 Base) MCG/ACT inhaler 8 puff (8 puffs Inhalation Given 05/07/19 2117)  ipratropium (ATROVENT HFA) inhaler 2 puff (2 puffs Inhalation Given 05/07/19 2144)  predniSONE (DELTASONE) tablet 60 mg (60 mg Oral Given 05/07/19 2117)     Initial Impression / Assessment and Plan / ED Course  I have reviewed the triage vital signs and the nursing notes.  Pertinent labs & imaging results that were available during my care of the patient were reviewed by me and considered in my medical decision making (see chart for details).     Tami Skinner is a 73 year old female with history of COPD on 3 L of oxygen, heart failure, high cholesterol who presents to the ED with shortness of breath.  Patient with concern for COPD.  Has been using her inhaler with some relief.  Finished a course of steroids last week.  Patient denies any fever, new cough.  Decreased breath sounds throughout, wheezing.  Not much edema on her legs.  Does not have any chest pain.  Overall she has good work of breathing.  Coronavirus test is negative.  Chest x-ray with no signs of infection.  No significant leukocytosis, anemia, electrolyte abnormality.  Patient felt better after albuterol and Atrovent.  Was given prednisone.  Patient does state that she thought she might have some pain with urination but on reevaluation for admission for COPD exacerbation/may be some very mild heart failure patient wants to continue treatment at home.  She feels significantly better after albuterol treatments.  She does not want to stay for urinalysis.  Patient has capacity to make this decision.  Patient given prescription for prednisone.  Educated about albuterol use.  Given strict return precautions and discharged from ED in good condition.  This chart was dictated using voice recognition software.  Despite best efforts to proofread,  errors can occur which can change the documentation meaning.    Final Clinical Impressions(s) / ED  Diagnoses   Final diagnoses:  COPD exacerbation [facility](HCC)    ED Discharge Orders         Ordered    predniSONE (DELTASONE) 20 MG tablet  Daily     05/07/19 2328           Virgina NorfolkCuratolo, Jaceyon Strole, DO 05/07/19 2333

## 2019-05-07 NOTE — ED Triage Notes (Signed)
Patient arrived with EMS from home reports SOB with chest tightness ( history of COPD) unrelieved by inhaler , she received ASA 324 mg by EMS , pt. added mild dysuria , family mentioned mild confusion today .

## 2019-05-07 NOTE — ED Notes (Signed)
Pharmacist notified to send pt.'s Atrovent inhaler.

## 2019-05-09 ENCOUNTER — Telehealth: Payer: Self-pay | Admitting: Pulmonary Disease

## 2019-05-09 NOTE — Telephone Encounter (Signed)
Attempted to call patient, mailbox full and not accepting any messages at this time. Will try again later.

## 2019-05-10 ENCOUNTER — Ambulatory Visit: Payer: Medicare Other | Admitting: Internal Medicine

## 2019-05-10 NOTE — Telephone Encounter (Signed)
Attempted to call pt, mailbox is still full and not accepting messages at this time.

## 2019-05-11 ENCOUNTER — Telehealth: Payer: Self-pay | Admitting: Pulmonary Disease

## 2019-05-11 ENCOUNTER — Other Ambulatory Visit: Payer: Self-pay | Admitting: Internal Medicine

## 2019-05-11 ENCOUNTER — Ambulatory Visit (INDEPENDENT_AMBULATORY_CARE_PROVIDER_SITE_OTHER): Payer: Medicare Other | Admitting: Internal Medicine

## 2019-05-11 ENCOUNTER — Encounter: Payer: Self-pay | Admitting: Internal Medicine

## 2019-05-11 DIAGNOSIS — I5032 Chronic diastolic (congestive) heart failure: Secondary | ICD-10-CM | POA: Diagnosis not present

## 2019-05-11 DIAGNOSIS — R300 Dysuria: Secondary | ICD-10-CM

## 2019-05-11 DIAGNOSIS — J441 Chronic obstructive pulmonary disease with (acute) exacerbation: Secondary | ICD-10-CM | POA: Diagnosis not present

## 2019-05-11 LAB — POC URINALSYSI DIPSTICK (AUTOMATED)
Glucose, UA: NEGATIVE
Nitrite, UA: NEGATIVE
Protein, UA: POSITIVE — AB
Spec Grav, UA: 1.02 (ref 1.010–1.025)
Urobilinogen, UA: NEGATIVE E.U./dL — AB
pH, UA: 6 (ref 5.0–8.0)

## 2019-05-11 NOTE — Telephone Encounter (Signed)
Attempted to return call, husband answered and stated patient is on a conference call with her doctor but he would have her return our call.

## 2019-05-11 NOTE — Progress Notes (Signed)
Virtual Visit via Video Note  I connected with Tami Skinner on 05/11/19 at  2:45 PM EDT by a video enabled telemedicine application and verified that I am speaking with the correct person using two identifiers.  Location: Patient: Home Provider: Office   I discussed the limitations of evaluation and management by telemedicine and the availability of in person appointments. The patient expressed understanding and agreed to proceed.  History of Present Illness:  Pt due for ER follow up. She went to the ER 7/4 with c/o cough and shortness of breath. BNP was slightly elevated (history of CHF, grade 1 diastolic dysfunction). Other labs were unremarkable. Her COVID 19 testing was negative. Chest xray did not show any acute findings, just chronic hyperinflation consistent with her COPD. She was given Duonebs and Prednisone in the ER. She was discharged and advised to follow up with PCP and pulmonology. Since discharge, she reports she still has some mild SOB. She is using Wixela, but has not used Spiriva in 3 days. She reports they gave her an Atrovent inhaler but she does not know how to use it. She has been using her nebulizer with some relief. She did not pick up the Prednisone because she does not like the way it makes her feel. She reports wheezing and chest heaviness with persistent SOB but no chest pain. Her main concern is that she "feels like my mind isn't right". Her grandson reports she has been having some visual hallucinations. She has been having some dysuria, but was not able to give a urine sample in the ER. She would like to request a urinalysis today.    Past Medical History:  Diagnosis Date  . Anxiety   . Asthmatic bronchitis   . COPD (chronic obstructive pulmonary disease) ([place])   . Depression   . Diabetes mellitus   . Diverticulitis   . Diverticulosis   . Hyperlipidemia   . Hypertension   . Osteopenia   . Sleep apnea   . Stress-induced cardiomyopathy    a. echo  09/28/2014: EF 45-50%, severe HK of mid-distal anterior, apical and mid-distal inferior walls suggestive of stress induced CM, mildly dilated LA, mildly dilated PASP, mild TR, trivial pericardial effusion    Current Outpatient Medications  Medication Sig Dispense Refill  . albuterol (PROVENTIL HFA;VENTOLIN HFA) 108 (90 Base) MCG/ACT inhaler INHALE 2 PUFFS INTO THE LUNGS EVERY 4 (FOUR) HOURS AS NEEDED FOR WHEEZING OR SHORTNESS OF BREATH. 18 Inhaler 6  . albuterol (PROVENTIL) (2.5 MG/3ML) 0.083% nebulizer solution Take 3 mLs (2.5 mg total) by nebulization every 6 (six) hours as needed for wheezing or shortness of breath. J44.9 75 mL 12  . ALPRAZolam (XANAX) 1 MG tablet Take 0.5 tablets (0.5 mg total) by mouth 3 (three) times daily as needed for anxiety. 50 tablet 3  . buPROPion (WELLBUTRIN XL) 150 MG 24 hr tablet TAKE 1 TABLET BY MOUTH EVERY DAY (Patient taking differently: Take 150 mg by mouth daily. ) 90 tablet 1  . busPIRone (BUSPAR) 10 MG tablet TAKE 1 TABLET BY MOUTH THREE TIMES A DAY 270 tablet 0  . Cholecalciferol (VITAMIN D-3) 1000 UNITS CAPS Take 2 capsules by mouth.     . clotrimazole-betamethasone (LOTRISONE) cream APPLY TO AFFECTED AREA TWICE A DAY 45 g 0  . conjugated estrogens (PREMARIN) vaginal cream Apply 0.5mg  (pea-sized amount)  just inside the vaginal introitus with a finger-tip on  Monday, Wednesday and Friday nights. 30 g 12  . Fluticasone-Salmeterol (WIXELA INHUB) 250-50 MCG/DOSE AEPB  Inhale 1 puff into the lungs 2 (two) times daily. 60 each 11  . furosemide (LASIX) 20 MG tablet Take 1 tablet (20 mg total) by mouth as needed for edema. 30 tablet 5  . metoprolol succinate (TOPROL-XL) 50 MG 24 hr tablet TAKE 1 TABLET (50 MG TOTAL) BY MOUTH DAILY. TAKE WITH OR IMMEDIATELY FOLLOWING A MEAL. (Patient taking differently: Take 50 mg by mouth daily. Take with or immediately following a meal) 90 tablet 0  . omeprazole (PRILOSEC) 40 MG capsule Take 1 capsule (40 mg total) by mouth daily. 30  capsule 3  . ondansetron (ZOFRAN) 4 MG tablet TAKE 1 TABLET BY MOUTH EVERY 8 HOURS AS NEEDED (Patient taking differently: Take 4 mg by mouth every 8 (eight) hours as needed for nausea or vomiting. ) 90 tablet 2  . ONE TOUCH ULTRA TEST test strip 1 EACH BY OTHER ROUTE DAILY AS NEEDED FOR OTHER. 25 each 5  . OXYGEN-HELIUM IN Inhale 2 % into the lungs at bedtime.      . predniSONE (DELTASONE) 20 MG tablet Take 3 tablets (60 mg total) by mouth daily for 4 days. 12 tablet 0  . rosuvastatin (CRESTOR) 5 MG tablet TAKE 1 TABLET BY MOUTH EVERY DAY 90 tablet 0  . sertraline (ZOLOFT) 100 MG tablet TAKE 2 TABLETS BY MOUTH TWICE A DAY 360 tablet 0  . tiotropium (SPIRIVA HANDIHALER) 18 MCG inhalation capsule Place 1 capsule (18 mcg total) into inhaler and inhale daily. 30 capsule 12   No current facility-administered medications for this visit.     Allergies  Allergen Reactions  . Amoxicillin-Pot Clavulanate     REACTION: stomach sensitive.  Lenice Llamas. Daliresp [Roflumilast]     Severe nausea   . Erythromycin Base     REACTION: GI upset  . Neurontin [Gabapentin]     anxiety    Family History  Problem Relation Age of Onset  . Diabetes Daughter   . Kidney disease Daughter   . Lung cancer Mother        smoked  . COPD Mother        smoked  . COPD Father        smoked  . Prostate cancer Father   . Kidney failure Father   . Breast cancer Maternal Aunt   . Diabetes Maternal Grandmother   . Breast cancer Maternal Grandmother   . Cancer Cousin        colon    Social History   Socioeconomic History  . Marital status: Married    Spouse name: Not on file  . Number of children: Not on file  . Years of education: Not on file  . Highest education level: Not on file  Occupational History  . Not on file  Social Needs  . Financial resource strain: Not on file  . Food insecurity    Worry: Not on file    Inability: Not on file  . Transportation needs    Medical: Not on file    Non-medical: Not on  file  Tobacco Use  . Smoking status: Former Smoker    Packs/day: 0.25    Years: 40.00    Pack years: 10.00    Types: Cigarettes    Quit date: 09/01/2016    Years since quitting: 2.6  . Smokeless tobacco: Never Used  . Tobacco comment: Uses nicotrol inhaler on occasion  Substance and Sexual Activity  . Alcohol use: No  . Drug use: No  . Sexual activity: Not Currently  Lifestyle  . Physical activity    Days per week: Not on file    Minutes per session: Not on file  . Stress: Not on file  Relationships  . Social connections    Talks on phoMusicianne: Not on file    Gets together: Not on file    Attends religious service: Not on file    Active member of club or organization: Not on file    Attends meetings of clubs or organizations: Not on file    Relationship status: Not on file  . Intimate partner violence    Fear of current or ex partner: Not on file    Emotionally abused: Not on file    Physically abused: Not on file    Forced sexual activity: Not on file  Other Topics Concern  . Not on file  Social History Narrative   Lives with husband in [place]. 2 dogs. Works at [employer].     Constitutional: Pt reports fatigue. Denies fever, malaise, headache or abrupt weight changes.  HEENT: Denies eye pain, eye redness, ear pain, ringing in the ears, wax buildup, runny nose, nasal congestion, bloody nose, or sore throat. Respiratory: Pt reports cough, shortness of breath. Denies difficulty breathing,  or sputum production.   Cardiovascular: Denies chest pain, chest tightness, palpitations or swelling in the hands or feet.  Gastrointestinal: Denies abdominal pain, bloating, constipation, diarrhea or blood in the stool.  GU: Pt reports dysuria. Denies urgency, frequency,  burning sensation, blood in urine, odor or discharge.  No other specific complaints in a complete review of systems (except as listed in HPI above).  Observations/Objective:  Wt Readings from Last 3 Encounters:  01/13/19  154 lb 9.6 oz (70.1 kg)  01/06/19 156 lb (70.8 kg)  12/31/18 156 lb (70.8 kg)    General: Appears her stated age, chronically ill appearing,  in NAD. HEENT: Head: normal shape and size;  Pulmonary/Chest: Increased effort per baseline. On continuous oxygen at 3L. No respiratory distress. No audible wheezing.  Neurological: Alert and oriented.  Psychiatric: Mildly anxious appearing.  BMET    Component Value Date/Time   NA 138 05/07/2019 2104   NA 140 09/28/2014 0637   K 4.7 05/07/2019 2104   K 4.3 09/28/2014 0637   CL 92 (L) 05/07/2019 2104   CL 108 (H) 09/28/2014 0637   CO2 39 (H) 05/07/2019 2104   CO2 26 09/28/2014 0637   GLUCOSE 118 (H) 05/07/2019 2104   GLUCOSE 117 (H) 09/28/2014 0637   BUN 11 05/07/2019 2104   BUN 13 09/28/2014 0637   CREATININE 0.76 05/07/2019 2104   CREATININE 1.17 (H) 12/31/2018 1625   CALCIUM 10.0 05/07/2019 2104   CALCIUM 7.7 (L) 09/28/2014 0637   GFRNONAA >60 05/07/2019 2104   GFRNONAA >60 09/28/2014 0637   GFRAA >60 05/07/2019 2104   GFRAA >60 09/28/2014 0637    Lipid Panel     Component Value Date/Time   CHOL 162 12/31/2018 1625   CHOL 110 09/28/2014 0637   TRIG 94 12/31/2018 1625   TRIG 102 09/28/2014 0637   HDL 77 12/31/2018 1625   HDL 54 09/28/2014 0637   CHOLHDL 2.1 12/31/2018 1625   VLDL 11.8 01/20/2017 1407   VLDL 20 09/28/2014 0637   LDLCALC 67 12/31/2018 1625   LDLCALC 36 09/28/2014 0637    CBC    Component Value Date/Time   WBC 6.0 05/07/2019 2104   RBC 4.51 05/07/2019 2104   HGB 13.2 05/07/2019 2104   HGB 13.0 09/30/2014  0525   HCT 43.8 05/07/2019 2104   HCT 39.0 09/30/2014 0525   PLT 186 05/07/2019 2104   PLT 234 09/30/2014 0525   MCV 97.1 05/07/2019 2104   MCV 93 09/30/2014 0525   MCH 29.3 05/07/2019 2104   MCHC 30.1 05/07/2019 2104   RDW 13.0 05/07/2019 2104   RDW 14.1 09/30/2014 0525   LYMPHSABS 1.0 05/07/2019 2104   LYMPHSABS 1.3 09/30/2014 0525   MONOABS 0.4 05/07/2019 2104   MONOABS 0.5 09/30/2014  0525   EOSABS 0.0 05/07/2019 2104   EOSABS 0.2 09/30/2014 0525   BASOSABS 0.0 05/07/2019 2104   BASOSABS 0.0 09/30/2014 0525    Hgb A1C Lab Results  Component Value Date   HGBA1C 5.9 (H) 12/31/2018        Assessment and Plan:  ER Follow Up for COPD Exacerbation, CHF:  ER notes, labs and imaging reviewed Continue Prednisone (Take 20 mg daily x 5 days- she feels like higher doses affects her adversely) until course is completed Continue Wixela, Spiriva and Albuterol as prescribed Continue Lasix, Metoprolol as prescribed Monitor daily weights. If > 3 lb weight gain in a day or > 5 lb weight gain in a week, I need to be notified She will follow up with pulmonology as previously scheduled.  Dysuria:  Urinalysis: 3+ leuks, 1+ blood Will send urine culture RX for Macrobid 100 mg BID x 5 days Push fluids  Will follow up after lab results available, return precautions discussed   Follow Up Instructions:    I discussed the assessment and treatment plan with the patient. The patient was provided an opportunity to ask questions and all were answered. The patient agreed with the plan and demonstrated an understanding of the instructions.   The patient was advised to call back or seek an in-person evaluation if the symptoms worsen or if the condition fails to improve as anticipated.     Nicki Reaperegina Raena Pau, NP

## 2019-05-12 ENCOUNTER — Telehealth: Payer: Self-pay

## 2019-05-12 ENCOUNTER — Ambulatory Visit: Payer: Medicare Other | Admitting: Internal Medicine

## 2019-05-12 LAB — URINE CULTURE
MICRO NUMBER:: 645945
SPECIMEN QUALITY:: ADEQUATE

## 2019-05-12 MED ORDER — NITROFURANTOIN MONOHYD MACRO 100 MG PO CAPS: 100.0000 mg | ORAL_CAPSULE | Freq: Two times a day (BID) | ORAL | 0 refills | Status: DC

## 2019-05-12 NOTE — Telephone Encounter (Signed)
It is OK with me if she wishes to hold off on taking prednisone  Waunita Schooner

## 2019-05-12 NOTE — Telephone Encounter (Signed)
Pt states that she went to the ER for her breathing several days ago and was given a rx for prednisone and a new inhaler. Pt states that she feels like her breathing has improved since starting the new inhaler and does not want to take the prednisone because last time it caused her to become very confused and she just isn't able to tolerate it. Pt just wants to know if DS wants her to take it. Advised I would forward the message to DS for advice.

## 2019-05-12 NOTE — Patient Instructions (Signed)
Chronic Obstructive Pulmonary Disease °Chronic obstructive pulmonary disease (COPD) is a long-term (chronic) lung problem. When you have COPD, it is hard for air to get in and out of your lungs. Usually the condition gets worse over time, and your lungs will never return to normal. There are things you can do to keep yourself as healthy as possible. °· Your doctor may treat your condition with: °? Medicines. °? Oxygen. °? Lung surgery. °· Your doctor may also recommend: °? Rehabilitation. This includes steps to make your body work better. It may involve a team of specialists. °? Quitting smoking, if you smoke. °? Exercise and changes to your diet. °? Comfort measures (palliative care). °Follow these instructions at home: °Medicines °· Take over-the-counter and prescription medicines only as told by your doctor. °· Talk to your doctor before taking any cough or allergy medicines. You may need to avoid medicines that cause your lungs to be dry. °Lifestyle °· If you smoke, stop. Smoking makes the problem worse. If you need help quitting, ask your doctor. °· Avoid being around things that make your breathing worse. This may include smoke, chemicals, and fumes. °· Stay active, but remember to rest as well. °· Learn and use tips on how to relax. °· Make sure you get enough sleep. Most adults need at least 7 hours of sleep every night. °· Eat healthy foods. Eat smaller meals more often. Rest before meals. °Controlled breathing °Learn and use tips on how to control your breathing as told by your doctor. Try: °· Breathing in (inhaling) through your nose for 1 second. Then, pucker your lips and breath out (exhale) through your lips for 2 seconds. °· Putting one hand on your belly (abdomen). Breathe in slowly through your nose for 1 second. Your hand on your belly should move out. Pucker your lips and breathe out slowly through your lips. Your hand on your belly should move in as you breathe out. ° °Controlled coughing °Learn  and use controlled coughing to clear mucus from your lungs. Follow these steps: °1. Lean your head a little forward. °2. Breathe in deeply. °3. Try to hold your breath for 3 seconds. °4. Keep your mouth slightly open while coughing 2 times. °5. Spit any mucus out into a tissue. °6. Rest and do the steps again 1 or 2 times as needed. °General instructions °· Make sure you get all the shots (vaccines) that your doctor recommends. Ask your doctor about a flu shot and a pneumonia shot. °· Use oxygen therapy and pulmonary rehabilitation if told by your doctor. If you need home oxygen therapy, ask your doctor if you should buy a tool to measure your oxygen level (oximeter). °· Make a COPD action plan with your doctor. This helps you to know what to do if you feel worse than usual. °· Manage any other conditions you have as told by your doctor. °· Avoid going outside when it is very hot, cold, or humid. °· Avoid people who have a sickness you can catch (contagious). °· Keep all follow-up visits as told by your doctor. This is important. °Contact a doctor if: °· You cough up more mucus than usual. °· There is a change in the color or thickness of the mucus. °· It is harder to breathe than usual. °· Your breathing is faster than usual. °· You have trouble sleeping. °· You need to use your medicines more often than usual. °· You have trouble doing your normal activities such as getting dressed   or walking around the house. °Get help right away if: °· You have shortness of breath while resting. °· You have shortness of breath that stops you from: °? Being able to talk. °? Doing normal activities. °· Your chest hurts for longer than 5 minutes. °· Your skin color is more blue than usual. °· Your pulse oximeter shows that you have low oxygen for longer than 5 minutes. °· You have a fever. °· You feel too tired to breathe normally. °Summary °· Chronic obstructive pulmonary disease (COPD) is a long-term lung problem. °· The way your  lungs work will never return to normal. Usually the condition gets worse over time. There are things you can do to keep yourself as healthy as possible. °· Take over-the-counter and prescription medicines only as told by your doctor. °· If you smoke, stop. Smoking makes the problem worse. °This information is not intended to replace advice given to you by your health care provider. Make sure you discuss any questions you have with your health care provider. °Document Released: 04/07/2008 Document Revised: 10/02/2017 Document Reviewed: 11/24/2016 °Elsevier Patient Education © 2020 Elsevier Inc. ° °

## 2019-05-12 NOTE — Telephone Encounter (Signed)
This encunter closed due to other encounter already open.

## 2019-05-12 NOTE — Telephone Encounter (Signed)
Pt called to say she received a MyChart Lab result that she did not understand. It looks like Araceli had ordered a UA under Dr Verlene Mayer name and it was released to [facility]. She is asking if she needs an antibiotic. If so, please send to [place].

## 2019-05-12 NOTE — Telephone Encounter (Signed)
Remo Lipps (DPR signed)called to see if abx was sent to [facility]. I advised Remo Lipps that macrobid(abx was sent electronically this afternoon to [facility]; Remo Lipps will call [facility] prior to picking up to be sure med is ready for pick up. Nothing further needed.

## 2019-05-12 NOTE — Telephone Encounter (Signed)
macrobid sent to pharmacy 

## 2019-05-12 NOTE — Telephone Encounter (Signed)
Spoke with patient, notified ok to hold off on taking prednisone per Dr.Simonds. Pt verbalized understanding.

## 2019-05-13 ENCOUNTER — Encounter: Payer: Self-pay | Admitting: Internal Medicine

## 2019-05-16 ENCOUNTER — Encounter: Payer: Self-pay | Admitting: Internal Medicine

## 2019-05-20 ENCOUNTER — Inpatient Hospital Stay: Payer: Medicare Other | Admitting: Pulmonary Disease

## 2019-05-23 ENCOUNTER — Encounter (HOSPITAL_COMMUNITY): Payer: Self-pay | Admitting: Licensed Clinical Social Worker

## 2019-05-25 ENCOUNTER — Other Ambulatory Visit: Payer: Self-pay | Admitting: Internal Medicine

## 2019-05-27 ENCOUNTER — Ambulatory Visit (HOSPITAL_COMMUNITY): Payer: Medicare Other | Admitting: Licensed Clinical Social Worker

## 2019-05-30 ENCOUNTER — Encounter: Payer: Self-pay | Admitting: Internal Medicine

## 2019-05-31 ENCOUNTER — Other Ambulatory Visit: Payer: Medicare Other

## 2019-06-01 ENCOUNTER — Inpatient Hospital Stay: Payer: Medicare Other | Admitting: Pulmonary Disease

## 2019-06-02 ENCOUNTER — Other Ambulatory Visit: Payer: Self-pay | Admitting: Internal Medicine

## 2019-06-03 NOTE — Telephone Encounter (Signed)
Last filled 02/10/2019 with 2 refills... please advise

## 2019-06-04 ENCOUNTER — Encounter (HOSPITAL_COMMUNITY): Payer: Self-pay | Admitting: Licensed Clinical Social Worker

## 2019-06-07 ENCOUNTER — Other Ambulatory Visit: Payer: Self-pay

## 2019-06-07 ENCOUNTER — Encounter: Payer: Self-pay | Admitting: Internal Medicine

## 2019-06-07 ENCOUNTER — Telehealth: Payer: Self-pay

## 2019-06-07 ENCOUNTER — Ambulatory Visit (INDEPENDENT_AMBULATORY_CARE_PROVIDER_SITE_OTHER): Payer: Medicare Other | Admitting: Internal Medicine

## 2019-06-07 VITALS — BP 122/80 | HR 75 | Temp 98.7°F | Wt 161.0 lb

## 2019-06-07 DIAGNOSIS — R300 Dysuria: Secondary | ICD-10-CM | POA: Diagnosis not present

## 2019-06-07 DIAGNOSIS — R339 Retention of urine, unspecified: Secondary | ICD-10-CM | POA: Diagnosis not present

## 2019-06-07 DIAGNOSIS — N3281 Overactive bladder: Secondary | ICD-10-CM | POA: Diagnosis not present

## 2019-06-07 DIAGNOSIS — N3946 Mixed incontinence: Secondary | ICD-10-CM | POA: Diagnosis not present

## 2019-06-07 LAB — POC URINALSYSI DIPSTICK (AUTOMATED)
Bilirubin, UA: NEGATIVE
Glucose, UA: NEGATIVE
Ketones, UA: NEGATIVE
Nitrite, UA: NEGATIVE
Protein, UA: POSITIVE — AB
Spec Grav, UA: 1.015 (ref 1.010–1.025)
Urobilinogen, UA: 0.2 E.U./dL
pH, UA: 7 (ref 5.0–8.0)

## 2019-06-07 MED ORDER — PREMARIN 0.625 MG/GM VA CREA: TOPICAL_CREAM | VAGINAL | 12 refills | Status: DC

## 2019-06-07 MED ORDER — PREMARIN 0.625 MG/GM VA CREA: TOPICAL_CREAM | VAGINAL | 12 refills | Status: AC

## 2019-06-07 MED ORDER — PHENAZOPYRIDINE HCL 200 MG PO TABS: 200.0000 mg | ORAL_TABLET | Freq: Three times a day (TID) | ORAL | 0 refills | Status: DC | PRN

## 2019-06-07 MED ORDER — OXYBUTYNIN CHLORIDE ER 5 MG PO TB24: 5.0000 mg | ORAL_TABLET | Freq: Every day | ORAL | 2 refills | Status: DC

## 2019-06-07 NOTE — Progress Notes (Signed)
HPI  Pt presents to the clinic today with c/o burning with urination and incomplete bladder emptying. This started 3 weeks ago. She had similar symptoms at that time. Urine culture from 7/8 grew no bacteria.  She did have some improvement with Macrobid, but she did not complete the course due to negative urine culture. She has not taken anything OTC for her symptoms.  She wears pads and rubber underwear for stress incontinence.  She states she has stopped using her Premarin cream.   Review of Systems  Past Medical History:  Diagnosis Date  . Anxiety   . Asthmatic bronchitis   . COPD (chronic obstructive pulmonary disease) ([place])   . Depression   . Diabetes mellitus   . Diverticulitis   . Diverticulosis   . Hyperlipidemia   . Hypertension   . Osteopenia   . Sleep apnea   . Stress-induced cardiomyopathy    a. echo 09/28/2014: EF 45-50%, severe HK of mid-distal anterior, apical and mid-distal inferior walls suggestive of stress induced CM, mildly dilated LA, mildly dilated PASP, mild TR, trivial pericardial effusion    Family History  Problem Relation Age of Onset  . Diabetes Daughter   . Kidney disease Daughter   . Lung cancer Mother        smoked  . COPD Mother        smoked  . COPD Father        smoked  . Prostate cancer Father   . Kidney failure Father   . Breast cancer Maternal Aunt   . Diabetes Maternal Grandmother   . Breast cancer Maternal Grandmother   . Cancer Cousin        colon    Social History   Socioeconomic History  . Marital status: Married    Spouse name: Not on file  . Number of children: Not on file  . Years of education: Not on file  . Highest education level: Not on file  Occupational History  . Not on file  Social Needs  . Financial resource strain: Not on file  . Food insecurity    Worry: Not on file    Inability: Not on file  . Transportation needs    Medical: Not on file    Non-medical: Not on file  Tobacco Use  . Smoking status:  Former Smoker    Packs/day: 0.25    Years: 40.00    Pack years: 10.00    Types: Cigarettes    Quit date: 09/01/2016    Years since quitting: 2.7  . Smokeless tobacco: Never Used  . Tobacco comment: Uses nicotrol inhaler on occasion  Substance and Sexual Activity  . Alcohol use: No  . Drug use: No  . Sexual activity: Not Currently  Lifestyle  . Physical activity    Days per week: Not on file    Minutes per session: Not on file  . Stress: Not on file  Relationships  . Social Herbalist on phone: Not on file    Gets together: Not on file    Attends religious service: Not on file    Active member of club or organization: Not on file    Attends meetings of clubs or organizations: Not on file    Relationship status: Not on file  . Intimate partner violence    Fear of current or ex partner: Not on file    Emotionally abused: Not on file    Physically abused: Not on file  Forced sexual activity: Not on file  Other Topics Concern  . Not on file  Social History Narrative   [place]. 2 dogs. Works at [employer].    Allergies  Allergen Reactions  . Amoxicillin-Pot Clavulanate     REACTION: stomach sensitive.  Lenice Llamas. Daliresp [Roflumilast]     Severe nausea   . Erythromycin Base     REACTION: GI upset  . Neurontin [Gabapentin]     anxiety     Constitutional: Denies fever, malaise, fatigue, headache or abrupt weight changes.   GU: Pt reports incomplete bladder emptying and burning with urination. Denies urgency, frequency, dysuria, blood in urine, odor or discharge. Skin: Denies redness, rashes, lesions or ulcercations.   No other specific complaints in a complete review of systems (except as listed in HPI above).    Objective:   Physical Exam  BP 122/80   Pulse 75   Temp 98.7 F (37.1 C) (Temporal)   Wt 161 lb (73 kg)   SpO2 98%   BMI 27.64 kg/m   Wt Readings from Last 3 Encounters:  01/13/19 154 lb 9.6 oz (70.1 kg)  01/06/19 156 lb (70.8  kg)  12/31/18 156 lb (70.8 kg)    General: Appears her stated age, well developed, well nourished in NAD. Abdomen: Soft. Normal bowel sounds. No distention or masses noted.  Tender to palpation over the bladder area. No CVA tenderness.        Assessment & Plan:   Dysuria, Incomplete Bladder Emptying:  Urinalysis: 3+ Leuks, 2+ Blood, Trace protein Will send urine culture Drink plenty of fluids Encouraged patient to try daily cranberry supplements for prevention. Recommended patient resume using her Premarin vaginal cream which may help with symptoms. Refill sent to pharmacy. Rx for Pyridium sent to pharmacy to help with symptom relief.  Overactive Bladder/Mixed Incontinence:  Rx for Ditropan sent to pharmacy to trial.  RTC as needed or if symptoms persist. Nicki Reaperegina Baity, NP

## 2019-06-07 NOTE — Addendum Note (Signed)
Addended by: Lurlean Nanny on: 06/07/2019 04:35 PM   Modules accepted: Orders

## 2019-06-07 NOTE — Patient Instructions (Signed)

## 2019-06-07 NOTE — Telephone Encounter (Signed)
Will discuss at upcoming appt.

## 2019-06-07 NOTE — Telephone Encounter (Signed)
Pt last seen 05/11/19 and urine symptoms got better but did not completely go away.  Now pt having a lot of burning upon urination with frequency and voiding small amounts; does not see blood in urine. Pt scheduled in office appt 06/07/19 at 2:15 with R Baity NP and pt understands the only symptoms addressed today will be urinary symptoms. Pt has no covid symptoms, no travel and no known exposure to + covid. Pt has wipes and sterile urine container at home and may try to bring a urine specimen with her if she voids right before leaving her home prior to appt to make sure she can get a specimen.

## 2019-06-09 LAB — URINE CULTURE
MICRO NUMBER:: 735057
SPECIMEN QUALITY:: ADEQUATE

## 2019-06-10 ENCOUNTER — Ambulatory Visit (HOSPITAL_COMMUNITY): Payer: Medicare Other | Admitting: Licensed Clinical Social Worker

## 2019-06-16 ENCOUNTER — Telehealth: Payer: Self-pay | Admitting: Student

## 2019-06-16 NOTE — Telephone Encounter (Signed)
Palliative NP left message for patient to follow up.

## 2019-06-23 ENCOUNTER — Other Ambulatory Visit: Payer: Self-pay | Admitting: Internal Medicine

## 2019-06-23 ENCOUNTER — Other Ambulatory Visit: Payer: Self-pay | Admitting: Cardiovascular Disease

## 2019-06-23 NOTE — Telephone Encounter (Signed)
Pt overdue for 6 month f/u.  Please contact pt for future appointment. 

## 2019-06-23 NOTE — Telephone Encounter (Signed)
Patient has been left a voicemail to callback and schedule  °

## 2019-06-27 ENCOUNTER — Other Ambulatory Visit: Payer: Medicare Other

## 2019-06-28 ENCOUNTER — Other Ambulatory Visit: Payer: Self-pay | Admitting: Internal Medicine

## 2019-06-29 ENCOUNTER — Ambulatory Visit: Payer: Medicare Other | Admitting: Licensed Clinical Social Worker

## 2019-07-04 ENCOUNTER — Ambulatory Visit: Payer: Medicare Other | Admitting: Psychiatry

## 2019-07-05 ENCOUNTER — Encounter: Payer: Self-pay | Admitting: Pulmonary Disease

## 2019-07-05 ENCOUNTER — Ambulatory Visit (INDEPENDENT_AMBULATORY_CARE_PROVIDER_SITE_OTHER): Payer: Medicare Other | Admitting: Pulmonary Disease

## 2019-07-05 ENCOUNTER — Other Ambulatory Visit: Payer: Self-pay

## 2019-07-05 VITALS — BP 122/68 | HR 68 | Temp 98.6°F | Ht 64.0 in

## 2019-07-05 DIAGNOSIS — J449 Chronic obstructive pulmonary disease, unspecified: Secondary | ICD-10-CM

## 2019-07-05 DIAGNOSIS — J441 Chronic obstructive pulmonary disease with (acute) exacerbation: Secondary | ICD-10-CM

## 2019-07-05 DIAGNOSIS — J9611 Chronic respiratory failure with hypoxia: Secondary | ICD-10-CM

## 2019-07-05 MED ORDER — BUDESONIDE 0.25 MG/2ML IN SUSP: 0.2500 mg | Freq: Two times a day (BID) | RESPIRATORY_TRACT | 10 refills | Status: DC

## 2019-07-05 MED ORDER — FORMOTEROL FUMARATE 20 MCG/2ML IN NEBU: 20.0000 ug | INHALATION_SOLUTION | Freq: Two times a day (BID) | RESPIRATORY_TRACT | 10 refills | Status: DC

## 2019-07-05 MED ORDER — METHYLPREDNISOLONE ACETATE 80 MG/ML IJ SUSP
80.0000 mg | Freq: Once | INTRAMUSCULAR | Status: AC
  Administered 2019-07-05: 80 mg via INTRAMUSCULAR

## 2019-07-05 NOTE — Patient Instructions (Addendum)
Depomedrol 80 mg administered today Begin budesonide 0.25 mg nebulized twice a day.  Prescription entered Begin formoterol 20 mcg nebulized twice a day.  Prescription entered Discontinue Wixela inhaler once you start on the above nebulized medications Continue Spiriva HandiHaler, 1 inhalation daily Continue oxygen therapy as close to 24 hours/day as possible Continue albuterol (inhaler or nebulizer) as needed for increased shortness of breath, wheezing, chest tightness, cough Follow-up in 6 weeks with Dr. Patsey Berthold

## 2019-07-05 NOTE — Progress Notes (Signed)
PULMONARY OFFICE FOLLOW UP NOTE  Requesting MD/Service: Self Date of initial consultation: 09/29/16 Reason for consultation: COPD, smoker  PT PROFILE: 18 F former smoker (quit October 2017) previously followed by Dr Lake Bells for lung nodule and moderate COPD. Maintained on Spiriva, Advair, PRN albuterol with class III dyspnea  PROBLEMS: Chronic hypoxemic respiratory failure Very severe COPD Obstructive sleep apnea. CPAP intolerant  DATA: Spirometry 02/03/12: Moderate obstruction (FEV1 1.21 liters, 49% pred) CT chest 09/27/14: Stable right lower lobe pulmonary nodule over over 2 year interval consistent benign etiology PFTs 11/11/16: mod-severe obstruction (FEV1 0.95 liters, 44% pred), mild restriction, severe reduction in DLCO. Lung volumes are probably underestimated by inadequate test performance CXR 11/28/16: hyperinflation. NACPD   INTERVAL: Last seen by me 05/04/19 which was performed as a remote (telephone) encounter.  No major pulmonary events in the interim   SUBJ: This is a scheduled follow-up. Since last encounter, no major events. However, she reports general malaise upon awakening today.  She also reports chronic fatigue, malaise and severe exertional dyspnea.  She denies fever.  She has had no significant weight change despite reporting a poor appetite.  She denies cough and sputum production.  She has no chest pain.  There is been no hemoptysis.  She denies lower extremity edema and calf tenderness.   MEDICATIONS: I have reviewed all medications and confirmed regimen as documented  Vitals:   07/05/19 1012  BP: 122/68  Pulse: 68  Temp: 98.6 F (37 C)  TempSrc: Temporal  SpO2: 94%  Height: 5\' 4"  (1.626 m)  3 LPM continuous flow   EXAM:  Gen: Flat affect, NAD HEENT: NCAT, sclerae white Neck: No JVD Lungs: breath sounds full, no wheezes or other adventitious sounds Cardiovascular: RRR, no murmurs Abdomen: Soft, nontender, normal BS Ext: without clubbing,  cyanosis, edema Neuro: grossly intact Skin: Limited exam, no lesions noted   DATA:   BMP Latest Ref Rng & Units 05/07/2019 12/31/2018 10/05/2018  Glucose 70 - 99 mg/dL 118(H) 91 151(H)  BUN 8 - 23 mg/dL 11 25 22   Creatinine 0.44 - 1.00 mg/dL 0.76 1.17(H) 1.32(H)  BUN/Creat Ratio 6 - 22 (calc) - 21 -  Sodium 135 - 145 mmol/L 138 140 140  Potassium 3.5 - 5.1 mmol/L 4.7 4.8 4.2  Chloride 98 - 111 mmol/L 92(L) 99 93(L)  CO2 22 - 32 mmol/L 39(H) 32 37(H)  Calcium 8.9 - 10.3 mg/dL 10.0 9.9 9.7    CBC Latest Ref Rng & Units 05/07/2019 12/31/2018 10/05/2018  WBC 4.0 - 10.5 K/uL 6.0 6.0 6.2  Hemoglobin 12.0 - 15.0 g/dL 13.2 12.3 11.9(L)  Hematocrit 36.0 - 46.0 % 43.8 38.8 38.9  Platelets 150 - 400 K/uL 186 217 258    CXR 07/04: Hyperinflation and hyperlucency without acute findings  IMPRESSION:     ICD-10-CM   1. COPD, very severe ([place])  J44.9   2. Chronic hypoxemic respiratory failure ([facility])  J96.11   3.   Wheezing, COPD exacerbation J44.1    At this point, I suspect that her respiratory capacity is such that she is not getting an effective inhalation of controller medications (Wixela, Spiriva).  She confirms my concern that she is not getting effective delivery of medication.  Therefore, we will change to nebulized budesonide and formoterol as noted below.   PLAN:  Depomedrol 80 mg administered today Begin budesonide 0.25 mg nebulized twice a day.  Prescription entered Begin formoterol 20 mcg nebulized twice a day.  Prescription entered Discontinue Wixela inhaler once the above  nebulized medications are initiated Continue Spiriva HandiHaler, 1 inhalation daily Continue oxygen therapy as close to 24 hours/day as possible Continue albuterol (inhaler or nebulizer) as needed for increased shortness of breath, wheezing, chest tightness, cough Follow-up in 6 weeks with Dr. Abran CantorGonzalez    Reta Norgren, MD PCCM service Mobile [phone] Pager [phone] 07/08/2019 10:34 AM

## 2019-07-07 ENCOUNTER — Encounter: Payer: Self-pay | Admitting: Internal Medicine

## 2019-07-07 ENCOUNTER — Ambulatory Visit (INDEPENDENT_AMBULATORY_CARE_PROVIDER_SITE_OTHER): Payer: Medicare Other | Admitting: Internal Medicine

## 2019-07-07 DIAGNOSIS — R0989 Other specified symptoms and signs involving the circulatory and respiratory systems: Secondary | ICD-10-CM | POA: Diagnosis not present

## 2019-07-07 DIAGNOSIS — F32A Depression, unspecified: Secondary | ICD-10-CM

## 2019-07-07 DIAGNOSIS — R197 Diarrhea, unspecified: Secondary | ICD-10-CM

## 2019-07-07 DIAGNOSIS — R059 Cough, unspecified: Secondary | ICD-10-CM

## 2019-07-07 DIAGNOSIS — R05 Cough: Secondary | ICD-10-CM | POA: Diagnosis not present

## 2019-07-07 DIAGNOSIS — J9611 Chronic respiratory failure with hypoxia: Secondary | ICD-10-CM

## 2019-07-07 DIAGNOSIS — F419 Anxiety disorder, unspecified: Secondary | ICD-10-CM | POA: Diagnosis not present

## 2019-07-07 DIAGNOSIS — J410 Simple chronic bronchitis: Secondary | ICD-10-CM

## 2019-07-07 DIAGNOSIS — F329 Major depressive disorder, single episode, unspecified: Secondary | ICD-10-CM

## 2019-07-07 MED ORDER — ALPRAZOLAM 0.5 MG PO TABS: 0.5000 mg | ORAL_TABLET | Freq: Every evening | ORAL | 0 refills | Status: DC | PRN

## 2019-07-07 NOTE — Progress Notes (Signed)
Virtual Visit via Video Note  I connected with Tami Skinner on 07/07/19 at  3:00 PM EDT by a video enabled telemedicine application and verified that I am speaking with the correct person using two identifiers.  Location: Patient: Home Provider: Office   I discussed the limitations of evaluation and management by telemedicine and the availability of in person appointments. The patient expressed understanding and agreed to proceed.  History of Present Illness:  Pt presents to the clinic today with c/o runny nose, cough and diarrhea. This started 2 days ago. She is blowing clear mucous out of her nose. The cough is non productive. She denies nasal congestion, ear pain, sore throat or shortness of breath. She has had some diarrhea but this seems to have resolved. She denies nausea, vomiting, constipation or blood in her stool. She denies fever, chills or body aches. She has severe COPD.  She also request that I take over management of her Xanax. Her pulmonary doctor was prescribing this (after she no longer wanted me to manage it). He increased her from 0.5 - 1 mg. She is also managed on Sertraline and Buspar. She is not seeing a therapist. She denies SI/HI.  Past Medical History:  Diagnosis Date  . Anxiety   . Asthmatic bronchitis   . COPD (chronic obstructive pulmonary disease) ([place])   . Depression   . Diabetes mellitus   . Diverticulitis   . Diverticulosis   . Hyperlipidemia   . Hypertension   . Osteopenia   . Sleep apnea   . Stress-induced cardiomyopathy    a. echo 09/28/2014: EF 45-50%, severe HK of mid-distal anterior, apical and mid-distal inferior walls suggestive of stress induced CM, mildly dilated LA, mildly dilated PASP, mild TR, trivial pericardial effusion    Current Outpatient Medications  Medication Sig Dispense Refill  . albuterol (PROVENTIL HFA;VENTOLIN HFA) 108 (90 Base) MCG/ACT inhaler INHALE 2 PUFFS INTO THE LUNGS EVERY 4 (FOUR) HOURS AS NEEDED FOR WHEEZING OR  SHORTNESS OF BREATH. 18 Inhaler 6  . albuterol (PROVENTIL) (2.5 MG/3ML) 0.083% nebulizer solution Take 3 mLs (2.5 mg total) by nebulization every 6 (six) hours as needed for wheezing or shortness of breath. J44.9 75 mL 12  . ALPRAZolam (XANAX) 1 MG tablet Take 0.5 tablets (0.5 mg total) by mouth 3 (three) times daily as needed for anxiety. 50 tablet 3  . budesonide (PULMICORT) 0.25 MG/2ML nebulizer solution Take 2 mLs (0.25 mg total) by nebulization 2 (two) times daily. 120 mL 10  . buPROPion (WELLBUTRIN XL) 150 MG 24 hr tablet TAKE 1 TABLET BY MOUTH EVERY DAY (Patient taking differently: Take 150 mg by mouth daily. ) 90 tablet 1  . busPIRone (BUSPAR) 10 MG tablet TAKE 1 TABLET BY MOUTH THREE TIMES A DAY 270 tablet 0  . Cholecalciferol (VITAMIN D-3) 1000 UNITS CAPS Take 2 capsules by mouth.     . clotrimazole-betamethasone (LOTRISONE) cream APPLY TO AFFECTED AREA TWICE A DAY 45 g 0  . conjugated estrogens (PREMARIN) vaginal cream Apply 0.5g (pea-sized amount) just inside the vaginal introitus with a finger-tip on Monday,Wednesday and Friday night 30 g 12  . formoterol (PERFOROMIST) 20 MCG/2ML nebulizer solution Take 2 mLs (20 mcg total) by nebulization 2 (two) times daily. 120 mL 10  . furosemide (LASIX) 20 MG tablet Take 1 tablet (20 mg total) by mouth as needed for edema. 30 tablet 5  . metoprolol succinate (TOPROL-XL) 50 MG 24 hr tablet TAKE 1 TABLET (50 MG TOTAL) BY MOUTH DAILY. TAKE  WITH OR IMMEDIATELY FOLLOWING A MEAL. 90 tablet 0  . omeprazole (PRILOSEC) 40 MG capsule TAKE 1 CAPSULE BY MOUTH EVERY DAY 90 capsule 0  . ondansetron (ZOFRAN) 4 MG tablet Take 1 tablet (4 mg total) by mouth every 8 (eight) hours as needed for nausea or vomiting. 90 tablet 2  . ONE TOUCH ULTRA TEST test strip 1 EACH BY OTHER ROUTE DAILY AS NEEDED FOR OTHER. 25 each 5  . oxybutynin (DITROPAN XL) 5 MG 24 hr tablet Take 1 tablet (5 mg total) by mouth at bedtime. 30 tablet 2  . OXYGEN-HELIUM IN Inhale 2 % into the lungs  at bedtime.      . phenazopyridine (PYRIDIUM) 200 MG tablet Take 1 tablet (200 mg total) by mouth 3 (three) times daily as needed for pain. 30 tablet 0  . rosuvastatin (CRESTOR) 5 MG tablet TAKE 1 TABLET BY MOUTH EVERY DAY 90 tablet 0  . sertraline (ZOLOFT) 100 MG tablet TAKE 2 TABLETS BY MOUTH TWICE A DAY 360 tablet 0  . tiotropium (SPIRIVA HANDIHALER) 18 MCG inhalation capsule Place 1 capsule (18 mcg total) into inhaler and inhale daily. 30 capsule 12   No current facility-administered medications for this visit.     Allergies  Allergen Reactions  . Amoxicillin-Pot Clavulanate     REACTION: stomach sensitive.  Lenice Llamas. Daliresp [Roflumilast]     Severe nausea   . Erythromycin Base     REACTION: GI upset  . Neurontin [Gabapentin]     anxiety    Family History  Problem Relation Age of Onset  . Diabetes Daughter   . Kidney disease Daughter   . Lung cancer Mother        smoked  . COPD Mother        smoked  . COPD Father        smoked  . Prostate cancer Father   . Kidney failure Father   . Breast cancer Maternal Aunt   . Diabetes Maternal Grandmother   . Breast cancer Maternal Grandmother   . Cancer Cousin        colon    Social History   Socioeconomic History  . Marital status: Married    Spouse name: Not on file  . Number of children: Not on file  . Years of education: Not on file  . Highest education level: Not on file  Occupational History  . Not on file  Social Needs  . Financial resource strain: Not on file  . Food insecurity    Worry: Not on file    Inability: Not on file  . Transportation needs    Medical: Not on file    Non-medical: Not on file  Tobacco Use  . Smoking status: Former Smoker    Packs/day: 0.25    Years: 40.00    Pack years: 10.00    Types: Cigarettes    Quit date: 09/01/2016    Years since quitting: 2.8  . Smokeless tobacco: Never Used  . Tobacco comment: Uses nicotrol inhaler on occasion  Substance and Sexual Activity  . Alcohol use:  No  . Drug use: No  . Sexual activity: Not Currently  Lifestyle  . Physical activity    Days per week: Not on file    Minutes per session: Not on file  . Stress: Not on file  Relationships  . Social Musicianconnections    Talks on phone: Not on file    Gets together: Not on file    Attends religious  service: Not on file    Active member of club or organization: Not on file    Attends meetings of clubs or organizations: Not on file    Relationship status: Not on file  . Intimate partner violence    Fear of current or ex partner: Not on file    Emotionally abused: Not on file    Physically abused: Not on file    Forced sexual activity: Not on file  Other Topics Concern  . Not on file  Social History Narrative   Lives with husband in [place]. 2 dogs. Works at [employer].     Constitutional: Denies fever, malaise, fatigue, headache or abrupt weight changes.  HEENT: Pt reports runny nose. Denies eye pain, eye redness, ear pain, ringing in the ears, wax buildup, nasal congestion, bloody nose, or sore throat. Respiratory: Pt reports cough. Denies difficulty breathing, shortness of breath, or sputum production.   Cardiovascular: Denies chest pain, chest tightness, palpitations or swelling in the hands or feet.  Gastrointestinal: Pt reports diarrhea. Denies abdominal pain, bloating, constipation, or blood in the stool.  Psych: Pt has a history of anxiety. Denies depression, SI/HI.  No other specific complaints in a complete review of systems (except as listed in HPI above).  Observations/Objective: There were no vitals taken for this visit. Wt Readings from Last 3 Encounters:  06/07/19 161 lb (73 kg)  01/13/19 154 lb 9.6 oz (70.1 kg)  01/06/19 156 lb (70.8 kg)    General: Appears her stated age, chronically ill appearing, in NAD. HEENT: Head: normal shape and size; Pulmonary/Chest: Normal effort. No respiratory distress.  Neurological: Alert and oriented.  Psychiatric: Anxious  appearing.  BMET    Component Value Date/Time   NA 138 05/07/2019 2104   NA 140 09/28/2014 0637   K 4.7 05/07/2019 2104   K 4.3 09/28/2014 0637   CL 92 (L) 05/07/2019 2104   CL 108 (H) 09/28/2014 0637   CO2 39 (H) 05/07/2019 2104   CO2 26 09/28/2014 0637   GLUCOSE 118 (H) 05/07/2019 2104   GLUCOSE 117 (H) 09/28/2014 0637   BUN 11 05/07/2019 2104   BUN 13 09/28/2014 0637   CREATININE 0.76 05/07/2019 2104   CREATININE 1.17 (H) 12/31/2018 1625   CALCIUM 10.0 05/07/2019 2104   CALCIUM 7.7 (L) 09/28/2014 0637   GFRNONAA >60 05/07/2019 2104   GFRNONAA >60 09/28/2014 0637   GFRAA >60 05/07/2019 2104   GFRAA >60 09/28/2014 0637    Lipid Panel     Component Value Date/Time   CHOL 162 12/31/2018 1625   CHOL 110 09/28/2014 0637   TRIG 94 12/31/2018 1625   TRIG 102 09/28/2014 0637   HDL 77 12/31/2018 1625   HDL 54 09/28/2014 0637   CHOLHDL 2.1 12/31/2018 1625   VLDL 11.8 01/20/2017 1407   VLDL 20 09/28/2014 0637   LDLCALC 67 12/31/2018 1625   LDLCALC 36 09/28/2014 0637    CBC    Component Value Date/Time   WBC 6.0 05/07/2019 2104   RBC 4.51 05/07/2019 2104   HGB 13.2 05/07/2019 2104   HGB 13.0 09/30/2014 0525   HCT 43.8 05/07/2019 2104   HCT 39.0 09/30/2014 0525   PLT 186 05/07/2019 2104   PLT 234 09/30/2014 0525   MCV 97.1 05/07/2019 2104   MCV 93 09/30/2014 0525   MCH 29.3 05/07/2019 2104   MCHC 30.1 05/07/2019 2104   RDW 13.0 05/07/2019 2104   RDW 14.1 09/30/2014 0525   LYMPHSABS 1.0 05/07/2019 2104  LYMPHSABS 1.3 09/30/2014 0525   MONOABS 0.4 05/07/2019 2104   MONOABS 0.5 09/30/2014 0525   EOSABS 0.0 05/07/2019 2104   EOSABS 0.2 09/30/2014 0525   BASOSABS 0.0 05/07/2019 2104   BASOSABS 0.0 09/30/2014 0525    Hgb A1C Lab Results  Component Value Date   HGBA1C 5.9 (H) 12/31/2018        Assessment and Plan:  Runny Nose, Cough, Diarrhea:  Likely viral Improved per pt No further intervention at this time Will monitor  Follow Up  Instructions:    I discussed the assessment and treatment plan with the patient. The patient was provided an opportunity to ask questions and all were answered. The patient agreed with the plan and demonstrated an understanding of the instructions.   The patient was advised to call back or seek an in-person evaluation if the symptoms worsen or if the condition fails to improve as anticipated.    Nicki Reaperegina Loi Rennaker, NP

## 2019-07-07 NOTE — Assessment & Plan Note (Signed)
Support offered today Continue Sertraline and Buspar Will start filling Xanax again- will need refill on 10/4

## 2019-07-07 NOTE — Patient Instructions (Signed)

## 2019-07-08 ENCOUNTER — Other Ambulatory Visit: Payer: Self-pay | Admitting: Pulmonary Disease

## 2019-07-12 ENCOUNTER — Encounter: Payer: Self-pay | Admitting: Internal Medicine

## 2019-07-13 ENCOUNTER — Encounter: Payer: Self-pay | Admitting: Internal Medicine

## 2019-07-15 ENCOUNTER — Other Ambulatory Visit: Payer: Self-pay

## 2019-07-15 ENCOUNTER — Other Ambulatory Visit: Payer: Self-pay | Admitting: Pulmonary Disease

## 2019-07-15 MED ORDER — ALPRAZOLAM 0.5 MG PO TABS: 0.5000 mg | ORAL_TABLET | Freq: Three times a day (TID) | ORAL | 0 refills | Status: DC | PRN

## 2019-07-15 NOTE — Telephone Encounter (Signed)
Pt said she talked with Vicente Males pharmacist at [facility] and they did not receive an rx for alprazolam 0.5 mg on 07/07/19. I spoke with Brandie at [facility] and Ladell Heads said did not receive alprazolam 0.5 mg on 07/07/19. The last time alprazolam was filled was alprazolam 1 mg # 50 on 06/06/19 taking 1/2 pill tid. Pt taking 1 1/2 pill tid pt should be out of alprazolam per Vicente Males pharmacist at [facility] said Rollene Fare was trying reduced dose for pt. I asked pt about her my charts note with R Baity Np and picking up the alprazolam; pt said that Avie Echevaria NP seemed sure it was sent in but pt has searched the house over and cannot find a bottle of alprazolam; pt said she did not pickup alprazolam this month. Again Brandie and Anna(I called back a second time to verify that med was not received on 07/07/19.) pharmacist at [facility] said that alprazolam 0.5 mg # 90 was not received on 07/07/19. Pt said she has one pill for tonight and then pt will be out of alprazolam. On med list the alprazolam 0.5 mg # 90 on 07/07/19 was a no print. Glenda Chroman FNP said she will fill med for pt before end of day. Pt voiced understanding and very appreciative.

## 2019-07-20 ENCOUNTER — Ambulatory Visit: Payer: Medicare Other | Admitting: Pulmonary Disease

## 2019-07-21 ENCOUNTER — Encounter: Payer: Self-pay | Admitting: Internal Medicine

## 2019-07-21 ENCOUNTER — Ambulatory Visit (INDEPENDENT_AMBULATORY_CARE_PROVIDER_SITE_OTHER): Payer: Medicare Other | Admitting: Internal Medicine

## 2019-07-21 ENCOUNTER — Other Ambulatory Visit: Payer: Self-pay

## 2019-07-21 VITALS — BP 122/70 | HR 76 | Temp 97.7°F | Wt 157.0 lb

## 2019-07-21 DIAGNOSIS — R531 Weakness: Secondary | ICD-10-CM | POA: Diagnosis not present

## 2019-07-21 DIAGNOSIS — R6889 Other general symptoms and signs: Secondary | ICD-10-CM | POA: Diagnosis not present

## 2019-07-21 DIAGNOSIS — B372 Candidiasis of skin and nail: Secondary | ICD-10-CM | POA: Diagnosis not present

## 2019-07-21 DIAGNOSIS — J9612 Chronic respiratory failure with hypercapnia: Secondary | ICD-10-CM

## 2019-07-21 DIAGNOSIS — Z23 Encounter for immunization: Secondary | ICD-10-CM

## 2019-07-21 MED ORDER — OMEPRAZOLE 40 MG PO CPDR: 40.0000 mg | DELAYED_RELEASE_CAPSULE | Freq: Two times a day (BID) | ORAL | 0 refills | Status: DC

## 2019-07-21 MED ORDER — NYSTATIN 100000 UNIT/GM EX POWD: Freq: Three times a day (TID) | CUTANEOUS | 5 refills | Status: AC

## 2019-07-21 NOTE — Progress Notes (Signed)
Subjective:    Patient ID: Tami Skinner, female    DOB: February 23, 1946, 73 y.o.   MRN: [id number]  HPI  Pt presents to the clinic today with multiple complaints.  She has a rash under her breast. She reports this has been there x 1 year. It is persistent. She is using Lotrisone cream without much relief.  She would also like to see if she can get home PT/OT. She reports generalized weakness and difficulty with transfers and gait. She is homebound due to need for continuous oxygen, and unsafe to leave home by herself.  Review of Systems      Past Medical History:  Diagnosis Date  . Anxiety   . Asthmatic bronchitis   . COPD (chronic obstructive pulmonary disease) (HCC)   . Depression   . Diabetes mellitus   . Diverticulitis   . Diverticulosis   . Hyperlipidemia   . Hypertension   . Osteopenia   . Sleep apnea   . Stress-induced cardiomyopathy    a. echo 09/28/2014: EF 45-50%, severe HK of mid-distal anterior, apical and mid-distal inferior walls suggestive of stress induced CM, mildly dilated LA, mildly dilated PASP, mild TR, trivial pericardial effusion    Current Outpatient Medications  Medication Sig Dispense Refill  . albuterol (PROVENTIL HFA;VENTOLIN HFA) 108 (90 Base) MCG/ACT inhaler INHALE 2 PUFFS INTO THE LUNGS EVERY 4 (FOUR) HOURS AS NEEDED FOR WHEEZING OR SHORTNESS OF BREATH. 18 Inhaler 6  . albuterol (PROVENTIL) (2.5 MG/3ML) 0.083% nebulizer solution Take 3 mLs (2.5 mg total) by nebulization every 6 (six) hours as needed for wheezing or shortness of breath. J44.9 75 mL 12  . ALPRAZolam (XANAX) 0.5 MG tablet Take 1 tablet (0.5 mg total) by mouth 3 (three) times daily as needed for anxiety. 90 tablet 0  . budesonide (PULMICORT) 0.25 MG/2ML nebulizer solution Take 2 mLs (0.25 mg total) by nebulization 2 (two) times daily. 120 mL 10  . busPIRone (BUSPAR) 10 MG tablet TAKE 1 TABLET BY MOUTH THREE TIMES A DAY 270 tablet 0  . Cholecalciferol (VITAMIN D-3) 1000 UNITS CAPS Take  2 capsules by mouth.     . clotrimazole-betamethasone (LOTRISONE) cream APPLY TO AFFECTED AREA TWICE A DAY 45 g 0  . conjugated estrogens (PREMARIN) vaginal cream Apply 0.5g (pea-sized amount) just inside the vaginal introitus with a finger-tip on Monday,Wednesday and Friday night 30 g 12  . formoterol (PERFOROMIST) 20 MCG/2ML nebulizer solution Take 2 mLs (20 mcg total) by nebulization 2 (two) times daily. 120 mL 10  . furosemide (LASIX) 20 MG tablet Take 1 tablet (20 mg total) by mouth as needed for edema. 30 tablet 5  . metoprolol succinate (TOPROL-XL) 50 MG 24 hr tablet TAKE 1 TABLET (50 MG TOTAL) BY MOUTH DAILY. TAKE WITH OR IMMEDIATELY FOLLOWING A MEAL. 90 tablet 0  . omeprazole (PRILOSEC) 40 MG capsule TAKE 1 CAPSULE BY MOUTH EVERY DAY 90 capsule 0  . ondansetron (ZOFRAN) 4 MG tablet Take 1 tablet (4 mg total) by mouth every 8 (eight) hours as needed for nausea or vomiting. 90 tablet 2  . ONE TOUCH ULTRA TEST test strip 1 EACH BY OTHER ROUTE DAILY AS NEEDED FOR OTHER. 25 each 5  . oxybutynin (DITROPAN XL) 5 MG 24 hr tablet Take 1 tablet (5 mg total) by mouth at bedtime. 30 tablet 2  . OXYGEN-HELIUM IN Inhale 2 % into the lungs at bedtime.      . phenazopyridine (PYRIDIUM) 200 MG tablet Take 1 tablet (200 mg  total) by mouth 3 (three) times daily as needed for pain. 30 tablet 0  . rosuvastatin (CRESTOR) 5 MG tablet TAKE 1 TABLET BY MOUTH EVERY DAY 90 tablet 0  . sertraline (ZOLOFT) 100 MG tablet TAKE 2 TABLETS BY MOUTH TWICE A DAY (Patient taking differently: Take 100 mg by mouth 2 (two) times daily. ) 360 tablet 0  . tiotropium (SPIRIVA HANDIHALER) 18 MCG inhalation capsule Place 1 capsule (18 mcg total) into inhaler and inhale daily. 30 capsule 12   No current facility-administered medications for this visit.     Allergies  Allergen Reactions  . Amoxicillin-Pot Clavulanate     REACTION: stomach sensitive.  Newton Pigg [Roflumilast]     Severe nausea   . Erythromycin Base     REACTION:  GI upset  . Neurontin [Gabapentin]     anxiety    Family History  Problem Relation Age of Onset  . Diabetes Daughter   . Kidney disease Daughter   . Lung cancer Mother        smoked  . COPD Mother        smoked  . COPD Father        smoked  . Prostate cancer Father   . Kidney failure Father   . Breast cancer Maternal Aunt   . Diabetes Maternal Grandmother   . Breast cancer Maternal Grandmother   . Cancer Cousin        colon    Social History   Socioeconomic History  . Marital status: Married    Spouse name: Not on file  . Number of children: Not on file  . Years of education: Not on file  . Highest education level: Not on file  Occupational History  . Not on file  Social Needs  . Financial resource strain: Not on file  . Food insecurity    Worry: Not on file    Inability: Not on file  . Transportation needs    Medical: Not on file    Non-medical: Not on file  Tobacco Use  . Smoking status: Former Smoker    Packs/day: 0.25    Years: 40.00    Pack years: 10.00    Types: Cigarettes    Quit date: 09/01/2016    Years since quitting: 2.8  . Smokeless tobacco: Never Used  . Tobacco comment: Uses nicotrol inhaler on occasion  Substance and Sexual Activity  . Alcohol use: No  . Drug use: No  . Sexual activity: Not Currently  Lifestyle  . Physical activity    Days per week: Not on file    Minutes per session: Not on file  . Stress: Not on file  Relationships  . Social Herbalist on phone: Not on file    Gets together: Not on file    Attends religious service: Not on file    Active member of club or organization: Not on file    Attends meetings of clubs or organizations: Not on file    Relationship status: Not on file  . Intimate partner violence    Fear of current or ex partner: Not on file    Emotionally abused: Not on file    Physically abused: Not on file    Forced sexual activity: Not on file  Other Topics Concern  . Not on file  Social  History Narrative   Lives with husband in [place]. 2 dogs. Works at [employer].     Constitutional: Denies fever, malaise, fatigue, headache  or abrupt weight changes.  Respiratory: Pt reports shortness of breath. Denies difficulty breathing, cough or sputum production.   Cardiovascular: Denies chest pain, chest tightness, palpitations or swelling in the hands or feet.  Musculoskeletal: Pt reports generalized weakness, difficulty with gait and transfers. Denies decrease in range of motion, muscle pain or joint swelling.  Skin: Pt reports rash under breast. Denies lesions or ulcercations.  Neurological: Pt reports difficulty with balance and coordination. Denies dizziness, difficulty with memory, difficulty with speech.    No other specific complaints in a complete review of systems (except as listed in HPI above).  Objective:   Physical Exam  BP 122/70   Pulse 76   Temp 97.7 F (36.5 C) (Temporal)   Wt 157 lb (71.2 kg)   SpO2 98%   BMI 26.95 kg/m  Wt Readings from Last 3 Encounters:  07/21/19 157 lb (71.2 kg)  06/07/19 161 lb (73 kg)  01/13/19 154 lb 9.6 oz (70.1 kg)    General: Appears her stated age, chronically ill appearing, in NAD. Skin: Yeast and maceration noted under breast. Cardiovascular: Normal rate and rhythm.  Pulmonary/Chest: Normal effort and diminished vesicular breath sounds. No respiratory distress.  Musculoskeletal: Gait slow and steady with use of rolling walker.  Neurological: Alert and oriented.   BMET    Component Value Date/Time   NA 138 05/07/2019 2104   NA 140 09/28/2014 0637   K 4.7 05/07/2019 2104   K 4.3 09/28/2014 0637   CL 92 (L) 05/07/2019 2104   CL 108 (H) 09/28/2014 0637   CO2 39 (H) 05/07/2019 2104   CO2 26 09/28/2014 0637   GLUCOSE 118 (H) 05/07/2019 2104   GLUCOSE 117 (H) 09/28/2014 0637   BUN 11 05/07/2019 2104   BUN 13 09/28/2014 0637   CREATININE 0.76 05/07/2019 2104   CREATININE 1.17 (H) 12/31/2018 1625   CALCIUM 10.0  05/07/2019 2104   CALCIUM 7.7 (L) 09/28/2014 0637   GFRNONAA >60 05/07/2019 2104   GFRNONAA >60 09/28/2014 0637   GFRAA >60 05/07/2019 2104   GFRAA >60 09/28/2014 0637    Lipid Panel     Component Value Date/Time   CHOL 162 12/31/2018 1625   CHOL 110 09/28/2014 0637   TRIG 94 12/31/2018 1625   TRIG 102 09/28/2014 0637   HDL 77 12/31/2018 1625   HDL 54 09/28/2014 0637   CHOLHDL 2.1 12/31/2018 1625   VLDL 11.8 01/20/2017 1407   VLDL 20 09/28/2014 0637   LDLCALC 67 12/31/2018 1625   LDLCALC 36 09/28/2014 0637    CBC    Component Value Date/Time   WBC 6.0 05/07/2019 2104   RBC 4.51 05/07/2019 2104   HGB 13.2 05/07/2019 2104   HGB 13.0 09/30/2014 0525   HCT 43.8 05/07/2019 2104   HCT 39.0 09/30/2014 0525   PLT 186 05/07/2019 2104   PLT 234 09/30/2014 0525   MCV 97.1 05/07/2019 2104   MCV 93 09/30/2014 0525   MCH 29.3 05/07/2019 2104   MCHC 30.1 05/07/2019 2104   RDW 13.0 05/07/2019 2104   RDW 14.1 09/30/2014 0525   LYMPHSABS 1.0 05/07/2019 2104   LYMPHSABS 1.3 09/30/2014 0525   MONOABS 0.4 05/07/2019 2104   MONOABS 0.5 09/30/2014 0525   EOSABS 0.0 05/07/2019 2104   EOSABS 0.2 09/30/2014 0525   BASOSABS 0.0 05/07/2019 2104   BASOSABS 0.0 09/30/2014 0525    Hgb A1C Lab Results  Component Value Date   HGBA1C 5.9 (H) 12/31/2018  Assessment & Plan:   Candida Intertrigo:  D/C Lotrisone RX for Nystatin Powder TID Keep area dry and free from moisture  Generalized Weakness, Difficulty with Transfers, CHRF:  Referral to Home Health for PT/OT as requested  Return precautions discussed Nicki Reaperegina , NP

## 2019-07-21 NOTE — Patient Instructions (Signed)
Intertrigo Intertrigo is skin irritation (inflammation) that happens in warm, moist areas of the body. The irritation can cause a rash and make skin raw and itchy. The rash is usually pink or red. It happens mostly between folds of skin or where skin rubs together, such as:  Between the toes.  In the armpits.  In the groin area.  Under the belly.  Under the breasts.  Around the butt area. This condition is not passed from person to person (is not contagious). What are the causes?  Heat, moisture, rubbing, and not enough air movement.  The condition can be made worse by: ? Sweat. ? Bacteria. ? A fungus, such as yeast. What increases the risk?  Moisture in your skin folds.  You are more likely to develop this condition if you: ? Have diabetes. ? Are overweight. ? Are not able to move around. ? Live in a warm and moist climate. ? Wear splints, braces, or other medical devices. ? Are not able to control your pee (urine) or poop (stool). What are the signs or symptoms?  A pink or red skin rash in the skin fold or near the skin fold.  Raw or scaly skin.  Itching.  A burning feeling.  Bleeding.  Leaking fluid.  A bad smell. How is this treated?  Cleaning and drying your skin.  Taking an antibiotic medicine or using an antibiotic skin cream for a bacterial infection.  Using an antifungal cream on your skin or taking pills for an infection that was caused by a fungus, such as yeast.  Using a steroid ointment to stop the itching and irritation.  Separating the skin fold with a clean cotton cloth to absorb moisture and allow air to flow into the area. Follow these instructions at home:  Keep the affected area clean and dry.  Do not scratch your skin.  Stay cool as much as you can. Use an air conditioner or a fan, if you have one.  Apply over-the-counter and prescription medicines only as told by your doctor.  If you were prescribed an antibiotic medicine,  use it as told by your doctor. Do not stop using the antibiotic even if your condition starts to get better.  Keep all follow-up visits as told by your doctor. This is important. How is this prevented?   Stay at a healthy weight.  Take care of your feet. This is very important if you have diabetes. You should: ? Wear shoes that fit well. ? Keep your feet dry. ? Wear clean cotton or wool socks.  Protect the skin in your groin and butt area as told by your doctor. To do this: ? Follow a regular cleaning routine. ? Use creams, powders, or ointments that protect your skin. ? Change protection pads often.  Do not wear tight clothes. Wear clothes that: ? Are loose. ? Take moisture away from your body. ? Are made of cotton.  Wear a bra that gives good support, if needed.  Shower and dry yourself well after being active. Use a hair dryer on a cool setting to dry between skin folds.  Keep your blood sugar under control if you have diabetes. Contact a doctor if:  Your symptoms do not get better with treatment.  Your symptoms get worse or they spread.  You notice more redness and warmth.  You have a fever. Summary  Intertrigo is skin irritation that occurs when folds of skin rub together.  This condition is caused by heat, moisture,   and rubbing.  This condition may be treated by cleaning and drying your skin and with medicines.  Apply over-the-counter and prescription medicines only as told by your doctor.  Keep all follow-up visits as told by your doctor. This is important. This information is not intended to replace advice given to you by your health care provider. Make sure you discuss any questions you have with your health care provider. Document Released: 11/22/2010 Document Revised: 07/29/2018 Document Reviewed: 07/29/2018 Elsevier Patient Education  2020 Elsevier Inc.  

## 2019-07-26 ENCOUNTER — Other Ambulatory Visit: Payer: Medicare Other

## 2019-07-27 ENCOUNTER — Other Ambulatory Visit: Payer: Self-pay | Admitting: Internal Medicine

## 2019-08-01 ENCOUNTER — Telehealth: Payer: Self-pay | Admitting: Internal Medicine

## 2019-08-01 NOTE — Telephone Encounter (Signed)
Ben PT with [facility] called today he regards to the patient. He stated she was suppose to start PT today, they called several times and left message but where unable to contact patient. He is needing a verbal order for a new start date which would be tomorrow/.   Phone- [phone]

## 2019-08-01 NOTE — Telephone Encounter (Signed)
Ok to start home health PT tomorrow

## 2019-08-02 ENCOUNTER — Other Ambulatory Visit: Payer: Self-pay | Admitting: Internal Medicine

## 2019-08-04 NOTE — Telephone Encounter (Signed)
Left message on voicemail.

## 2019-08-05 ENCOUNTER — Telehealth: Payer: Self-pay

## 2019-08-05 NOTE — Telephone Encounter (Signed)
Ok for OT 2 x week for 3 weeks

## 2019-08-05 NOTE — Telephone Encounter (Signed)
Will Schalla, therapist with [facility], calling for verbal orders for OT 2 times a week for 3 weeks for patient. CB is [phone], if he does not answer we can leave a detailed message on his voicemail.

## 2019-08-09 NOTE — Telephone Encounter (Signed)
VO given to Will. 

## 2019-08-11 ENCOUNTER — Other Ambulatory Visit: Payer: Self-pay

## 2019-08-11 ENCOUNTER — Encounter: Payer: Self-pay | Admitting: Licensed Clinical Social Worker

## 2019-08-11 ENCOUNTER — Ambulatory Visit (INDEPENDENT_AMBULATORY_CARE_PROVIDER_SITE_OTHER): Payer: Medicare Other | Admitting: Licensed Clinical Social Worker

## 2019-08-11 ENCOUNTER — Encounter: Payer: Self-pay | Admitting: Internal Medicine

## 2019-08-11 DIAGNOSIS — Z87891 Personal history of nicotine dependence: Secondary | ICD-10-CM

## 2019-08-11 DIAGNOSIS — J449 Chronic obstructive pulmonary disease, unspecified: Secondary | ICD-10-CM

## 2019-08-11 DIAGNOSIS — E119 Type 2 diabetes mellitus without complications: Secondary | ICD-10-CM

## 2019-08-11 DIAGNOSIS — F411 Generalized anxiety disorder: Secondary | ICD-10-CM

## 2019-08-11 DIAGNOSIS — M6281 Muscle weakness (generalized): Secondary | ICD-10-CM

## 2019-08-11 DIAGNOSIS — I10 Essential (primary) hypertension: Secondary | ICD-10-CM

## 2019-08-11 DIAGNOSIS — Z9981 Dependence on supplemental oxygen: Secondary | ICD-10-CM

## 2019-08-11 DIAGNOSIS — R2689 Other abnormalities of gait and mobility: Secondary | ICD-10-CM

## 2019-08-11 DIAGNOSIS — L304 Erythema intertrigo: Secondary | ICD-10-CM

## 2019-08-11 DIAGNOSIS — J9612 Chronic respiratory failure with hypercapnia: Secondary | ICD-10-CM
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
## 2019-08-12 ENCOUNTER — Encounter: Payer: Self-pay | Admitting: Internal Medicine

## 2019-08-14 ENCOUNTER — Other Ambulatory Visit: Payer: Self-pay | Admitting: Internal Medicine

## 2019-08-15 ENCOUNTER — Other Ambulatory Visit: Payer: Self-pay | Admitting: Family Medicine

## 2019-08-15 ENCOUNTER — Telehealth: Payer: Self-pay | Admitting: Pulmonary Disease

## 2019-08-15 ENCOUNTER — Encounter: Payer: Self-pay | Admitting: Internal Medicine

## 2019-08-15 MED ORDER — BUSPIRONE HCL 10 MG PO TABS: 10.0000 mg | ORAL_TABLET | Freq: Three times a day (TID) | ORAL | 0 refills | Status: DC

## 2019-08-15 NOTE — Telephone Encounter (Signed)
Xanax last filled 07/15/2019.Marland KitchenMarland KitchenMarland Kitchen please advise

## 2019-08-15 NOTE — Telephone Encounter (Signed)
Called and spoke to pt. Pt reports of increased sob with exertion with and sleep. Pt stated that she only slept 2hr last night due to sob. Wheezing and cough is baseline per pt.  Pt using albuterol neb Q5H with temporary relief in symptoms. Pt monitoring oxygen levels. spo2 is maintaining around 92% on 2L cont. Pt was unable to complete a sentence due to sob during our conversation and crackling was also heard.  I have spoken to Dr. Patsey Berthold verbally who recommended ED.  Pt has been made aware of this information. Nothing further is needed.

## 2019-08-16 ENCOUNTER — Ambulatory Visit: Payer: Medicare Other | Admitting: Cardiovascular Disease

## 2019-08-17 ENCOUNTER — Telehealth: Payer: Self-pay | Admitting: Adult Health Nurse Practitioner

## 2019-08-17 NOTE — Telephone Encounter (Signed)
Received call from [place], [place] with [employer], who was currently at pt's house.  Pt is experiencing sob and crackling was noted by [place]. I have made Tacy aware of below message and recommendations.  Pt was originally scheduled for a f/u on 08/30/2019, this appt was moved to tomorrow for an office visit by our [facility] staff. Due to pt's sx, she will not be able to have an in office visit.  Olivia Mackie placed the phone on speaker and I was able to relay this information to pt and pt's daughter. Pt became very upset as the scheduler made her aware that she would be able to come in.  I explained office protocol and reasoning for this.   Pt requested that appt be switched back to 08/30/2019. Pt has been scheduled for acute visit via telephone tomorrow with DK. Nothing further is needed.

## 2019-08-17 NOTE — Telephone Encounter (Signed)
Called patient to set up appointment.  Scheduled appointment for 08/25/2019 at 3pm.  She did not have anything to write the appointment down with and asked that I text her the appointment.  I did text the appointment date and time to her cell phone and left my phone number for any questions. Govind Furey K. Olena Heckle NP

## 2019-08-18 ENCOUNTER — Encounter: Payer: Self-pay | Admitting: Internal Medicine

## 2019-08-18 ENCOUNTER — Ambulatory Visit (INDEPENDENT_AMBULATORY_CARE_PROVIDER_SITE_OTHER): Payer: Medicare Other | Admitting: Internal Medicine

## 2019-08-18 ENCOUNTER — Other Ambulatory Visit: Payer: Self-pay

## 2019-08-18 ENCOUNTER — Ambulatory Visit: Payer: Medicare Other | Admitting: Pulmonary Disease

## 2019-08-18 DIAGNOSIS — J441 Chronic obstructive pulmonary disease with (acute) exacerbation: Secondary | ICD-10-CM

## 2019-08-18 MED ORDER — AZITHROMYCIN 250 MG PO TABS: ORAL_TABLET | ORAL | 0 refills | Status: DC

## 2019-08-18 MED ORDER — PREDNISONE 20 MG PO TABS: 20.0000 mg | ORAL_TABLET | Freq: Every day | ORAL | 1 refills | Status: DC

## 2019-08-18 NOTE — Telephone Encounter (Deleted)
I do not see where you advised pt to the Buspar 2 tabs BID... please advise

## 2019-08-18 NOTE — Patient Instructions (Signed)
Prednisone 20 mg for 5 days Z pak

## 2019-08-18 NOTE — Progress Notes (Signed)
PULMONARY OFFICE FOLLOW UP NOTE    I connected with the patient by telephone enabled telemedicine visit and verified that I am speaking with the correct person using two identifiers.    I discussed the limitations, risks, security and privacy concerns of performing an evaluation and management service by telemedicine and the availability of in-person appointments. I also discussed with the patient that there may be a patient responsible charge related to this service. The patient expressed understanding and agreed to proceed.  PATIENT AGREES AND CONFIRMS -YES   Other persons participating in the visit and their role in the encounter: Patient, nursing  This visit type was conducted due to national recommendations for restrictions regarding the COVID-19 Pandemic (e.g. social distancing).  This format is felt to be most appropriate for this patient at this time.  All issues noted in this document were discussed and addressed.      Tami Skinner former smoker (quit October 2017) previously followed by Dr Kendrick Fries for lung nodule and moderate COPD. Maintained on Spiriva, Advair, PRN albuterol with class III dyspnea  PROBLEMS: Chronic hypoxemic respiratory failure Very severe COPD Obstructive sleep apnea. CPAP intolerant  DATA: Spirometry 02/03/12: Moderate obstruction (FEV1 1.21 liters, 49% pred) CT chest 09/27/14: Stable right lower lobe pulmonary nodule over over 2 year interval consistent benign etiology PFTs 11/11/16: mod-severe obstruction (FEV1 0.95 liters, 44% pred), mild restriction, severe reduction in DLCO. Lung volumes are probably underestimated by inadequate test performance CXR 11/28/16: hyperinflation. NACPD   CC +wheezing, +cough  HPI +Chest heaviness +wheezing +cough Using NEBS     MEDICATIONS: I have reviewed all medications and confirmed regimen as documented  There were no vitals filed for this visit.3 LPM continuous flow   Review of Systems:  Gen:   Denies  fever, sweats, chills weight loss  HEENT: Denies blurred vision, double vision, ear pain, eye pain, hearing loss, nose bleeds, sore throat Cardiac:  No dizziness, chest pain or heaviness, chest tightness,edema, No JVD Resp:   No cough, +sputum production, +shortness of breath,+wheezing, -hemoptysis,  Gi: Denies swallowing difficulty, stomach pain, nausea or vomiting, diarrhea, constipation, bowel incontinence Gu:  Denies bladder incontinence, burning urine Ext:   Denies Joint pain, stiffness or swelling Skin: Denies  skin rash, easy bruising or bleeding or hives Endoc:  Denies polyuria, polydipsia , polyphagia or weight change Psych:   Denies depression, insomnia or hallucinations  Other:  All other systems negative    DATA:   BMP Latest Ref Rng & Units 05/07/2019 12/31/2018 10/05/2018  Glucose 70 - 99 mg/dL 824(M) 91 353(I)  BUN 8 - 23 mg/dL 11 25 22   Creatinine 0.44 - 1.00 mg/dL 1.44) 3.15(Q)  BUN/Creat Ratio 6 - 22 (calc) - 21 -  Sodium 135 - 145 mmol/L 138 140 140  Potassium 3.5 - 5.1 mmol/L 4.7 4.8 4.2  Chloride 98 - 111 mmol/L 92(L) 99 93(L)  CO2 22 - 32 mmol/L 39(H) 32 37(H)  Calcium 8.9 - 10.3 mg/dL 0.08(Q 9.9 9.7    CBC Latest Ref Rng & Units 05/07/2019 12/31/2018 10/05/2018  WBC 4.0 - 10.5 K/uL 6.0 6.0 6.2  Hemoglobin 12.0 - 15.0 g/dL 14/01/2018 95.0 11.9(L)  Hematocrit 36.0 - 46.0 % 43.8 38.8 38.9  Platelets 150 - 400 K/uL 186 217 258    CXR 07/04: Hyperinflation and hyperlucency without acute findings  IMPRESSION:   COPD exacerbation   PLAN:  Patient Refusing PREDNISONE Continue oxygen as prescribed 24/7 Will start NEBULIZED therapy after 09/04 is completed  Start Z pak Continue Spiriva as prescribed NEB ALB as needed  I advised Patient to go to ER if symptoms worsen   COVID-19 EDUCATION: The signs and symptoms of COVID-19 were discussed with the patient and how to seek care for testing.  The importance of social distancing was discussed today. Hand  Washing Techniques and avoid touching face was advised.     MEDICATION ADJUSTMENTS/LABS AND TESTS ORDERED: Z pak Prednisone 20 mg for 5 days   CURRENT MEDICATIONS REVIEWED AT LENGTH WITH PATIENT TODAY   Patient satisfied with Plan of action and management. All questions answered  Follow up in 6 months  Total time Spent 23 mins  Maretta Bees Patricia Pesa, M.D.  [facility]  Medical Director [place] Director [facility] Cardio-Pulmonary Department

## 2019-08-19 MED ORDER — ONDANSETRON 8 MG PO TBDP: 8.0000 mg | ORAL_TABLET | Freq: Three times a day (TID) | ORAL | 2 refills | Status: DC | PRN

## 2019-08-19 NOTE — Telephone Encounter (Signed)
Pt reports you told her to take 2 tabs of Zofran to equal 8mg  and it is working and she would like a refill with the updated sig, please advise

## 2019-08-23 ENCOUNTER — Encounter: Payer: Self-pay | Admitting: Internal Medicine

## 2019-08-25 ENCOUNTER — Telehealth: Payer: Self-pay | Admitting: Adult Health Nurse Practitioner

## 2019-08-25 ENCOUNTER — Other Ambulatory Visit: Payer: Self-pay | Admitting: Adult Health Nurse Practitioner

## 2019-08-25 ENCOUNTER — Other Ambulatory Visit: Payer: Self-pay

## 2019-08-25 NOTE — Telephone Encounter (Signed)
Had scheduled telephone appointment with patient at 3pm.  Patient unable to be reached by telephone and VM was left with reason for call and call back number Akim Watkinson K. Olena Heckle NP

## 2019-08-28 ENCOUNTER — Encounter: Payer: Self-pay | Admitting: Internal Medicine

## 2019-08-29 ENCOUNTER — Other Ambulatory Visit: Payer: Self-pay | Admitting: Internal Medicine

## 2019-08-30 ENCOUNTER — Ambulatory Visit: Payer: Medicare Other | Admitting: Pulmonary Disease

## 2019-08-31 ENCOUNTER — Ambulatory Visit: Payer: Medicare Other | Admitting: Pulmonary Disease

## 2019-09-06 ENCOUNTER — Other Ambulatory Visit: Payer: Self-pay | Admitting: Internal Medicine

## 2019-09-15 ENCOUNTER — Ambulatory Visit: Payer: Medicare Other | Admitting: Licensed Clinical Social Worker

## 2019-09-15 ENCOUNTER — Other Ambulatory Visit: Payer: Medicare Other

## 2019-09-15 ENCOUNTER — Other Ambulatory Visit: Payer: Self-pay

## 2019-09-18 ENCOUNTER — Encounter: Payer: Self-pay | Admitting: Internal Medicine

## 2019-09-18 ENCOUNTER — Other Ambulatory Visit: Payer: Self-pay | Admitting: Internal Medicine

## 2019-09-19 ENCOUNTER — Ambulatory Visit (INDEPENDENT_AMBULATORY_CARE_PROVIDER_SITE_OTHER): Payer: Medicare Other | Admitting: Internal Medicine

## 2019-09-19 ENCOUNTER — Other Ambulatory Visit: Payer: Self-pay

## 2019-09-19 ENCOUNTER — Encounter: Payer: Self-pay | Admitting: Internal Medicine

## 2019-09-19 VITALS — BP 126/74 | HR 87 | Temp 96.7°F | Wt 156.0 lb

## 2019-09-19 DIAGNOSIS — R110 Nausea: Secondary | ICD-10-CM

## 2019-09-19 DIAGNOSIS — J449 Chronic obstructive pulmonary disease, unspecified: Secondary | ICD-10-CM | POA: Diagnosis not present

## 2019-09-19 DIAGNOSIS — R109 Unspecified abdominal pain: Secondary | ICD-10-CM | POA: Diagnosis not present

## 2019-09-19 DIAGNOSIS — R062 Wheezing: Secondary | ICD-10-CM

## 2019-09-19 DIAGNOSIS — R140 Abdominal distension (gaseous): Secondary | ICD-10-CM | POA: Diagnosis not present

## 2019-09-19 DIAGNOSIS — G8929 Other chronic pain: Secondary | ICD-10-CM

## 2019-09-19 DIAGNOSIS — Z86010 Personal history of colonic polyps: Secondary | ICD-10-CM

## 2019-09-19 MED ORDER — DEXAMETHASONE SODIUM PHOSPHATE 10 MG/ML IJ SOLN
10.0000 mg | Freq: Once | INTRAMUSCULAR | Status: AC
  Administered 2019-09-19: 10 mg via INTRAMUSCULAR

## 2019-09-19 MED ORDER — ALPRAZOLAM 0.5 MG PO TABS: 0.5000 mg | ORAL_TABLET | Freq: Three times a day (TID) | ORAL | 0 refills | Status: DC | PRN

## 2019-09-19 NOTE — Telephone Encounter (Signed)
This is a duplicate request Rx has already been filled

## 2019-09-19 NOTE — Patient Instructions (Signed)
Abdominal Pain, Adult    Many things can cause belly (abdominal) pain. Most times, belly pain is not dangerous. Many cases of belly pain can be watched and treated at home. Sometimes belly pain is serious, though. Your doctor will try to find the cause of your belly pain.  Follow these instructions at home:  · Take over-the-counter and prescription medicines only as told by your doctor. Do not take medicines that help you poop (laxatives) unless told to by your doctor.  · Drink enough fluid to keep your pee (urine) clear or pale yellow.  · Watch your belly pain for any changes.  · Keep all follow-up visits as told by your doctor. This is important.  Contact a doctor if:  · Your belly pain changes or gets worse.  · You are not hungry, or you lose weight without trying.  · You are having trouble pooping (constipated) or have watery poop (diarrhea) for more than 2-3 days.  · You have pain when you pee or poop.  · Your belly pain wakes you up at night.  · Your pain gets worse with meals, after eating, or with certain foods.  · You are throwing up and cannot keep anything down.  · You have a fever.  Get help right away if:  · Your pain does not go away as soon as your doctor says it should.  · You cannot stop throwing up.  · Your pain is only in areas of your belly, such as the right side or the left lower part of the belly.  · You have bloody or black poop, or poop that looks like tar.  · You have very bad pain, cramping, or bloating in your belly.  · You have signs of not having enough fluid or water in your body (dehydration), such as:  ? Dark pee, very little pee, or no pee.  ? Cracked lips.  ? Dry mouth.  ? Sunken eyes.  ? Sleepiness.  ? Weakness.  This information is not intended to replace advice given to you by your health care provider. Make sure you discuss any questions you have with your health care provider.  Document Released: 04/07/2008 Document Revised: 05/09/2016 Document Reviewed: 04/02/2016  Elsevier  Interactive Patient Education © 2020 Elsevier Inc.

## 2019-09-19 NOTE — Progress Notes (Signed)
Subjective:    Patient ID: Tami Skinner, female    DOB: [birth year], 73 y.o.   MRN: [id number]  HPI  Pt presents to the clinic today with c/o persistent abdominal pain. This started over 1 year ago. She reports the abdominal pain feels like pressure. She reports associated bloating. The pain is not worse or better with eating. She also has poor appetite and chronic nausea, not well controlled with Ondasterone. She denies vomiting. Her bowels are moving normally although she reports having to strain at times. She has not seen any blood in her stools. She has a history of reflux, and denies breakthrough on Omeprazole.  She has had a normal HFP, Amylase, Lipase and H Pylori. She had a normal CT abdomen 06/2018. Her last colonoscopy was 10 years ago, which showed 2 10 mm hyperplastic polyps in the transverse colon. They wanted her to return in 5 years. She was referred to GI for the same but they refused to do a UGI/Colonoscopy on her due to her breathing issues.   Review of Systems      Past Medical History:  Diagnosis Date  . Anxiety   . Asthmatic bronchitis   . COPD (chronic obstructive pulmonary disease) ([place])   . Depression   . Diabetes mellitus   . Diverticulitis   . Diverticulosis   . Hyperlipidemia   . Hypertension   . Osteopenia   . Sleep apnea   . Stress-induced cardiomyopathy    a. echo 09/28/2014: EF 45-50%, severe HK of mid-distal anterior, apical and mid-distal inferior walls suggestive of stress induced CM, mildly dilated LA, mildly dilated PASP, mild TR, trivial pericardial effusion    Current Outpatient Medications  Medication Sig Dispense Refill  . albuterol (PROVENTIL HFA;VENTOLIN HFA) 108 (90 Base) MCG/ACT inhaler INHALE 2 PUFFS INTO THE LUNGS EVERY 4 (FOUR) HOURS AS NEEDED FOR WHEEZING OR SHORTNESS OF BREATH. 18 Inhaler 6  . albuterol (PROVENTIL) (2.5 MG/3ML) 0.083% nebulizer solution Take 3 mLs (2.5 mg total) by nebulization every 6 (six) hours as needed for  wheezing or shortness of breath. J44.9 75 mL 12  . ALPRAZolam (XANAX) 0.5 MG tablet Take 1 tablet (0.5 mg total) by mouth 3 (three) times daily as needed for anxiety. 90 tablet 0  . azithromycin (ZITHROMAX Z-PAK) 250 MG tablet Take 2 tablets on Day 1 and then 1 tablet daily till gone. 6 each 0  . budesonide (PULMICORT) 0.25 MG/2ML nebulizer solution Take 2 mLs (0.25 mg total) by nebulization 2 (two) times daily. 120 mL 10  . busPIRone (BUSPAR) 10 MG tablet Take 1 tablet (10 mg total) by mouth 3 (three) times daily. 270 tablet 0  . Cholecalciferol (VITAMIN D-3) 1000 UNITS CAPS Take 2 capsules by mouth.     . conjugated estrogens (PREMARIN) vaginal cream Apply 0.5g (pea-sized amount) just inside the vaginal introitus with a finger-tip on Monday,Wednesday and Friday night 30 g 12  . furosemide (LASIX) 20 MG tablet Take 1 tablet (20 mg total) by mouth as needed for edema. 30 tablet 5  . metoprolol succinate (TOPROL-XL) 50 MG 24 hr tablet TAKE 1 TABLET (50 MG TOTAL) BY MOUTH DAILY. TAKE WITH OR IMMEDIATELY FOLLOWING A MEAL. 90 tablet 0  . nystatin (MYCOSTATIN/NYSTOP) powder Apply topically 3 (three) times daily. 60 g 5  . omeprazole (PRILOSEC) 40 MG capsule Take 1 capsule (40 mg total) by mouth 2 (two) times daily. 180 capsule 0  . ondansetron (ZOFRAN ODT) 8 MG disintegrating tablet Take 1 tablet (8  mg total) by mouth every 8 (eight) hours as needed for nausea or vomiting. 30 tablet 2  . ondansetron (ZOFRAN) 4 MG tablet Take 1 tablet (4 mg total) by mouth every 8 (eight) hours as needed for nausea or vomiting. 90 tablet 2  . ONE TOUCH ULTRA TEST test strip 1 EACH BY OTHER ROUTE DAILY AS NEEDED FOR OTHER. 25 each 5  . oxybutynin (DITROPAN-XL) 5 MG 24 hr tablet TAKE 1 TABLET BY MOUTH EVERYDAY AT BEDTIME 90 tablet 0  . OXYGEN-HELIUM IN Inhale 2 % into the lungs at bedtime.      . predniSONE (DELTASONE) 20 MG tablet Take 1 tablet (20 mg total) by mouth daily with breakfast. 5 days 5 tablet 1  . rosuvastatin  (CRESTOR) 5 MG tablet TAKE 1 TABLET BY MOUTH EVERY DAY 90 tablet 0  . sertraline (ZOLOFT) 100 MG tablet TAKE 2 TABLETS BY MOUTH TWICE A DAY 360 tablet 0  . tiotropium (SPIRIVA HANDIHALER) 18 MCG inhalation capsule Place 1 capsule (18 mcg total) into inhaler and inhale daily. 30 capsule 12   No current facility-administered medications for this visit.     Allergies  Allergen Reactions  . Amoxicillin-Pot Clavulanate     REACTION: stomach sensitive.  Lenice Llamas. Daliresp [Roflumilast]     Severe nausea   . Erythromycin Base     REACTION: GI upset  . Neurontin [Gabapentin]     anxiety    Family History  Problem Relation Age of Onset  . Diabetes Daughter   . Kidney disease Daughter   . Lung cancer Mother        smoked  . COPD Mother        smoked  . COPD Father        smoked  . Prostate cancer Father   . Kidney failure Father   . Breast cancer Maternal Aunt   . Diabetes Maternal Grandmother   . Breast cancer Maternal Grandmother   . Cancer Cousin        colon    Social History   Socioeconomic History  . Marital status: Married    Spouse name: Not on file  . Number of children: Not on file  . Years of education: Not on file  . Highest education level: Not on file  Occupational History  . Not on file  Social Needs  . Financial resource strain: Not on file  . Food insecurity    Worry: Not on file    Inability: Not on file  . Transportation needs    Medical: Not on file    Non-medical: Not on file  Tobacco Use  . Smoking status: Former Smoker    Packs/day: 0.25    Years: 40.00    Pack years: 10.00    Types: Cigarettes    Quit date: 09/01/2016    Years since quitting: 3.0  . Smokeless tobacco: Never Used  . Tobacco comment: Uses nicotrol inhaler on occasion  Substance and Sexual Activity  . Alcohol use: No  . Drug use: No  . Sexual activity: Not Currently  Lifestyle  . Physical activity    Days per week: Not on file    Minutes per session: Not on file  . Stress:  Not on file  Relationships  . Social Musicianconnections    Talks on phone: Not on file    Gets together: Not on file    Attends religious service: Not on file    Active member of club or organization: Not on  file    Attends meetings of clubs or organizations: Not on file    Relationship status: Not on file  . Intimate partner violence    Fear of current or ex partner: Not on file    Emotionally abused: Not on file    Physically abused: Not on file    Forced sexual activity: Not on file  Other Topics Concern  . Not on file  Social History Narrative   Lives with husband in [place]. 2 dogs. Works at [employer].     Constitutional: Denies fever, headache or abrupt weight changes.  Respiratory: Pt reports shortness of breath. Denies difficulty breathing, cough or sputum production.   Cardiovascular: Denies chest pain, chest tightness, palpitations or swelling.  Gastrointestinal: Pt reports abdominal pain and bloating. Denies constipation, diarrhea or blood in the stool.  GU: Denies urgency, frequency, pain with urination, burning sensation, blood in urine, odor or discharge.   No other specific complaints in a complete review of systems (except as listed in HPI above).  Objective:   Physical Exam   BP 126/74   Pulse 87   Temp (!) 96.7 F (35.9 C) (Temporal)   Wt 156 lb (70.8 kg)   SpO2 96%   BMI 26.78 kg/m  Wt Readings from Last 3 Encounters:  09/19/19 156 lb (70.8 kg)  07/21/19 157 lb (71.2 kg)  06/07/19 161 lb (73 kg)    General: Appears her stated age, chronically ill appearing, in NAD. Skin: Warm, dry and intact. Bluish discoloration of BLE. Cardiovascular: Normal rate and rhythm. Pulmonary/Chest: Normal effort and positive vesicular breath sounds. No respiratory distress. No wheezes, rales or ronchi noted.  Abdomen: Soft and nontender. Normal bowel sounds. No distention or masses noted.  Musculoskeletal: Gait slow and steady with rolling walker. Neurological: Alert and  oriented.  Psychiatric: Mood and affect mildly flat.  BMET    Component Value Date/Time   NA 138 05/07/2019 2104   NA 140 09/28/2014 0637   K 4.7 05/07/2019 2104   K 4.3 09/28/2014 0637   CL 92 (L) 05/07/2019 2104   CL 108 (H) 09/28/2014 0637   CO2 39 (H) 05/07/2019 2104   CO2 26 09/28/2014 0637   GLUCOSE 118 (H) 05/07/2019 2104   GLUCOSE 117 (H) 09/28/2014 0637   BUN 11 05/07/2019 2104   BUN 13 09/28/2014 0637   CREATININE 0.76 05/07/2019 2104   CREATININE 1.17 (H) 12/31/2018 1625   CALCIUM 10.0 05/07/2019 2104   CALCIUM 7.7 (L) 09/28/2014 0637   GFRNONAA >60 05/07/2019 2104   GFRNONAA >60 09/28/2014 0637   GFRAA >60 05/07/2019 2104   GFRAA >60 09/28/2014 0637    Lipid Panel     Component Value Date/Time   CHOL 162 12/31/2018 1625   CHOL 110 09/28/2014 0637   TRIG 94 12/31/2018 1625   TRIG 102 09/28/2014 0637   HDL 77 12/31/2018 1625   HDL 54 09/28/2014 0637   CHOLHDL 2.1 12/31/2018 1625   VLDL 11.8 01/20/2017 1407   VLDL 20 09/28/2014 0637   LDLCALC 67 12/31/2018 1625   LDLCALC 36 09/28/2014 0637    CBC    Component Value Date/Time   WBC 6.0 05/07/2019 2104   RBC 4.51 05/07/2019 2104   HGB 13.2 05/07/2019 2104   HGB 13.0 09/30/2014 0525   HCT 43.8 05/07/2019 2104   HCT 39.0 09/30/2014 0525   PLT 186 05/07/2019 2104   PLT 234 09/30/2014 0525   MCV 97.1 05/07/2019 2104   MCV 93 09/30/2014 0525  MCH 29.3 05/07/2019 2104   MCHC 30.1 05/07/2019 2104   RDW 13.0 05/07/2019 2104   RDW 14.1 09/30/2014 0525   LYMPHSABS 1.0 05/07/2019 2104   LYMPHSABS 1.3 09/30/2014 0525   MONOABS 0.4 05/07/2019 2104   MONOABS 0.5 09/30/2014 0525   EOSABS 0.0 05/07/2019 2104   EOSABS 0.2 09/30/2014 0525   BASOSABS 0.0 05/07/2019 2104   BASOSABS 0.0 09/30/2014 0525    Hgb A1C Lab Results  Component Value Date   HGBA1C 5.9 (H) 12/31/2018           Assessment & Plan:   Chronic Nausea, Chronic Abdominal Pain, and Nausea:  Negative workup She really needs a  EGD/Colonscopy Will reach out to GI colleague to see if he has any additional recommendations, maybe CT virtual colonoscopy Continue current meds at this time  Wheezing, COPD:  Decadron 10 mg IM today Continue inhalers Follow up with pulm on Thursday as scheduled  Will reach out to patient with plan once I receive correspondence from my colleague [name], NP

## 2019-09-19 NOTE — Addendum Note (Signed)
Addended by: Lurlean Nanny on: 09/19/2019 04:54 PM   Modules accepted: Orders

## 2019-09-19 NOTE — Telephone Encounter (Signed)
Last filled 08/15/19... please advise

## 2019-09-21 NOTE — Addendum Note (Signed)
Addended by: Jearld Fenton on: 09/21/2019 11:48 AM   Modules accepted: Orders

## 2019-09-22 ENCOUNTER — Inpatient Hospital Stay
Admission: EM | Admit: 2019-09-22 | Discharge: 2019-09-27 | DRG: 189 | Disposition: A | Discharge status: Home Health | Payer: Medicare Other | Source: Ambulatory Visit | Attending: Internal Medicine | Admitting: Internal Medicine

## 2019-09-22 ENCOUNTER — Other Ambulatory Visit: Payer: Self-pay

## 2019-09-22 ENCOUNTER — Encounter: Payer: Self-pay | Admitting: Pulmonary Disease

## 2019-09-22 ENCOUNTER — Ambulatory Visit (INDEPENDENT_AMBULATORY_CARE_PROVIDER_SITE_OTHER): Payer: Medicare Other | Admitting: Pulmonary Disease

## 2019-09-22 ENCOUNTER — Encounter: Payer: Self-pay | Admitting: Emergency Medicine

## 2019-09-22 ENCOUNTER — Emergency Department: Payer: Medicare Other

## 2019-09-22 VITALS — BP 130/78 | HR 85 | Temp 97.7°F | Ht 64.0 in | Wt 156.0 lb

## 2019-09-22 DIAGNOSIS — J9601 Acute respiratory failure with hypoxia: Secondary | ICD-10-CM | POA: Diagnosis not present

## 2019-09-22 DIAGNOSIS — J9622 Acute and chronic respiratory failure with hypercapnia: Principal | ICD-10-CM | POA: Diagnosis present

## 2019-09-22 DIAGNOSIS — Z20828 Contact with and (suspected) exposure to other viral communicable diseases: Secondary | ICD-10-CM | POA: Diagnosis present

## 2019-09-22 DIAGNOSIS — Z79890 Hormone replacement therapy (postmenopausal): Secondary | ICD-10-CM

## 2019-09-22 DIAGNOSIS — J209 Acute bronchitis, unspecified: Secondary | ICD-10-CM | POA: Diagnosis present

## 2019-09-22 DIAGNOSIS — F411 Generalized anxiety disorder: Secondary | ICD-10-CM

## 2019-09-22 DIAGNOSIS — E785 Hyperlipidemia, unspecified: Secondary | ICD-10-CM | POA: Diagnosis present

## 2019-09-22 DIAGNOSIS — Z7951 Long term (current) use of inhaled steroids: Secondary | ICD-10-CM

## 2019-09-22 DIAGNOSIS — J441 Chronic obstructive pulmonary disease with (acute) exacerbation: Secondary | ICD-10-CM

## 2019-09-22 DIAGNOSIS — Z90711 Acquired absence of uterus with remaining cervical stump: Secondary | ICD-10-CM | POA: Diagnosis not present

## 2019-09-22 DIAGNOSIS — Z801 Family history of malignant neoplasm of trachea, bronchus and lung: Secondary | ICD-10-CM

## 2019-09-22 DIAGNOSIS — J9621 Acute and chronic respiratory failure with hypoxia: Secondary | ICD-10-CM

## 2019-09-22 DIAGNOSIS — I160 Hypertensive urgency: Secondary | ICD-10-CM | POA: Diagnosis present

## 2019-09-22 DIAGNOSIS — M8580 Other specified disorders of bone density and structure, unspecified site: Secondary | ICD-10-CM | POA: Diagnosis present

## 2019-09-22 DIAGNOSIS — R0602 Shortness of breath: Secondary | ICD-10-CM | POA: Diagnosis present

## 2019-09-22 DIAGNOSIS — Z66 Do not resuscitate: Secondary | ICD-10-CM | POA: Diagnosis present

## 2019-09-22 DIAGNOSIS — J9602 Acute respiratory failure with hypercapnia: Secondary | ICD-10-CM | POA: Diagnosis not present

## 2019-09-22 DIAGNOSIS — F419 Anxiety disorder, unspecified: Secondary | ICD-10-CM | POA: Diagnosis present

## 2019-09-22 DIAGNOSIS — J449 Chronic obstructive pulmonary disease, unspecified: Secondary | ICD-10-CM

## 2019-09-22 DIAGNOSIS — I110 Hypertensive heart disease with heart failure: Secondary | ICD-10-CM | POA: Diagnosis present

## 2019-09-22 DIAGNOSIS — F329 Major depressive disorder, single episode, unspecified: Secondary | ICD-10-CM | POA: Diagnosis present

## 2019-09-22 DIAGNOSIS — Z825 Family history of asthma and other chronic lower respiratory diseases: Secondary | ICD-10-CM

## 2019-09-22 DIAGNOSIS — K219 Gastro-esophageal reflux disease without esophagitis: Secondary | ICD-10-CM | POA: Diagnosis present

## 2019-09-22 DIAGNOSIS — E119 Type 2 diabetes mellitus without complications: Secondary | ICD-10-CM | POA: Diagnosis present

## 2019-09-22 DIAGNOSIS — G4733 Obstructive sleep apnea (adult) (pediatric): Secondary | ICD-10-CM | POA: Diagnosis present

## 2019-09-22 DIAGNOSIS — L539 Erythematous condition, unspecified: Secondary | ICD-10-CM

## 2019-09-22 DIAGNOSIS — Z888 Allergy status to other drugs, medicaments and biological substances status: Secondary | ICD-10-CM

## 2019-09-22 DIAGNOSIS — I5032 Chronic diastolic (congestive) heart failure: Secondary | ICD-10-CM | POA: Diagnosis present

## 2019-09-22 DIAGNOSIS — Z800 Family history of malignant neoplasm of digestive organs: Secondary | ICD-10-CM

## 2019-09-22 DIAGNOSIS — Z833 Family history of diabetes mellitus: Secondary | ICD-10-CM

## 2019-09-22 DIAGNOSIS — Z880 Allergy status to penicillin: Secondary | ICD-10-CM

## 2019-09-22 DIAGNOSIS — G9341 Metabolic encephalopathy: Secondary | ICD-10-CM | POA: Diagnosis present

## 2019-09-22 DIAGNOSIS — Z9981 Dependence on supplemental oxygen: Secondary | ICD-10-CM

## 2019-09-22 DIAGNOSIS — J440 Chronic obstructive pulmonary disease with acute lower respiratory infection: Secondary | ICD-10-CM

## 2019-09-22 DIAGNOSIS — I252 Old myocardial infarction: Secondary | ICD-10-CM

## 2019-09-22 DIAGNOSIS — Z79899 Other long term (current) drug therapy: Secondary | ICD-10-CM

## 2019-09-22 DIAGNOSIS — Z515 Encounter for palliative care: Secondary | ICD-10-CM | POA: Diagnosis not present

## 2019-09-22 DIAGNOSIS — Z8042 Family history of malignant neoplasm of prostate: Secondary | ICD-10-CM

## 2019-09-22 DIAGNOSIS — Z7189 Other specified counseling: Secondary | ICD-10-CM | POA: Diagnosis not present

## 2019-09-22 DIAGNOSIS — Z87891 Personal history of nicotine dependence: Secondary | ICD-10-CM

## 2019-09-22 DIAGNOSIS — Z881 Allergy status to other antibiotic agents status: Secondary | ICD-10-CM

## 2019-09-22 DIAGNOSIS — Z841 Family history of disorders of kidney and ureter: Secondary | ICD-10-CM

## 2019-09-22 DIAGNOSIS — Z803 Family history of malignant neoplasm of breast: Secondary | ICD-10-CM

## 2019-09-22 LAB — CBC
HCT: 47.5 % — ABNORMAL HIGH (ref 36.0–46.0)
Hemoglobin: 14.5 g/dL (ref 12.0–15.0)
MCH: 29.1 pg (ref 26.0–34.0)
MCHC: 30.5 g/dL (ref 30.0–36.0)
MCV: 95.4 fL (ref 80.0–100.0)
Platelets: 257 10*3/uL (ref 150–400)
RBC: 4.98 MIL/uL (ref 3.87–5.11)
RDW: 13.2 % (ref 11.5–15.5)
WBC: 7.8 10*3/uL (ref 4.0–10.5)
nRBC: 0 % (ref 0.0–0.2)

## 2019-09-22 LAB — BLOOD GAS, VENOUS
Acid-Base Excess: 12 mmol/L — ABNORMAL HIGH (ref 0.0–2.0)
Bicarbonate: 44.8 mmol/L — ABNORMAL HIGH (ref 20.0–28.0)
O2 Saturation: 34.2 %
Patient temperature: 37
pCO2, Ven: 112 mmHg (ref 44.0–60.0)
pH, Ven: 7.21 — ABNORMAL LOW (ref 7.250–7.430)
pO2, Ven: <31 mmHg — CL (ref 32.0–45.0)

## 2019-09-22 LAB — COMPREHENSIVE METABOLIC PANEL
ALT: 23 U/L (ref 0–44)
AST: 26 U/L (ref 15–41)
Albumin: 5 g/dL (ref 3.5–5.0)
Alkaline Phosphatase: 68 U/L (ref 38–126)
Anion gap: 13 (ref 5–15)
BUN: 20 mg/dL (ref 8–23)
CO2: 37 mmol/L — ABNORMAL HIGH (ref 22–32)
Calcium: 10.2 mg/dL (ref 8.9–10.3)
Chloride: 90 mmol/L — ABNORMAL LOW (ref 98–111)
Creatinine, Ser: 0.77 mg/dL (ref 0.44–1.00)
GFR calc Af Amer: >60 mL/min (ref >60)
GFR calc non Af Amer: >60 mL/min (ref >60)
Glucose, Bld: 139 mg/dL — ABNORMAL HIGH (ref 70–99)
Potassium: 4.1 mmol/L (ref 3.5–5.1)
Sodium: 140 mmol/L (ref 135–145)
Total Bilirubin: 1 mg/dL (ref 0.3–1.2)
Total Protein: 8.4 g/dL — ABNORMAL HIGH (ref 6.5–8.1)

## 2019-09-22 LAB — BLOOD GAS, ARTERIAL
Acid-Base Excess: 11.9 mmol/L — ABNORMAL HIGH (ref 0.0–2.0)
Bicarbonate: 42.8 mmol/L — ABNORMAL HIGH (ref 20.0–28.0)
Delivery systems: POSITIVE
Expiratory PAP: 8
FIO2: 0.28
Inspiratory PAP: 16
O2 Saturation: 88.2 %
Patient temperature: 37
RATE: 12 resp/min
pCO2 arterial: 91 mmHg (ref 32.0–48.0)
pH, Arterial: 7.28 — ABNORMAL LOW (ref 7.350–7.450)
pO2, Arterial: 62 mmHg — ABNORMAL LOW (ref 83.0–108.0)

## 2019-09-22 LAB — GLUCOSE, CAPILLARY: Glucose-Capillary: 109 mg/dL — ABNORMAL HIGH (ref 70–99)

## 2019-09-22 LAB — TROPONIN I (HIGH SENSITIVITY): Troponin I (High Sensitivity): 9 ng/L (ref <18)

## 2019-09-22 LAB — SARS CORONAVIRUS 2 BY RT PCR (HOSPITAL ORDER, PERFORMED IN ~~LOC~~ HOSPITAL LAB): SARS Coronavirus 2: NEGATIVE

## 2019-09-22 LAB — PROCALCITONIN: Procalcitonin: <0.1 ng/mL

## 2019-09-22 MED ORDER — TIOTROPIUM BROMIDE MONOHYDRATE 18 MCG IN CAPS
18.0000 ug | ORAL_CAPSULE | Freq: Every day | RESPIRATORY_TRACT | Status: DC
  Filled 2019-09-22: qty 5

## 2019-09-22 MED ORDER — BUSPIRONE HCL 10 MG PO TABS
10.0000 mg | ORAL_TABLET | Freq: Three times a day (TID) | ORAL | Status: DC
  Administered 2019-09-22 – 2019-09-27 (×14): 10 mg via ORAL
  Filled 2019-09-22: qty 2
  Filled 2019-09-22: qty 1
  Filled 2019-09-22 (×2): qty 2
  Filled 2019-09-22 (×12): qty 1
  Filled 2019-09-22: qty 2

## 2019-09-22 MED ORDER — VITAMIN D 25 MCG (1000 UNIT) PO TABS
2000.0000 [IU] | ORAL_TABLET | Freq: Every day | ORAL | Status: DC
  Administered 2019-09-22 – 2019-09-27 (×6): 2000 [IU] via ORAL
  Filled 2019-09-22 (×6): qty 2

## 2019-09-22 MED ORDER — IPRATROPIUM-ALBUTEROL 0.5-2.5 (3) MG/3ML IN SOLN: 3.0000 mL | Freq: Four times a day (QID) | RESPIRATORY_TRACT | Status: DC | PRN

## 2019-09-22 MED ORDER — BUDESONIDE 0.5 MG/2ML IN SUSP
0.5000 mg | Freq: Two times a day (BID) | RESPIRATORY_TRACT | Status: DC
  Administered 2019-09-23 – 2019-09-27 (×9): 0.5 mg via RESPIRATORY_TRACT
  Filled 2019-09-22 (×9): qty 2

## 2019-09-22 MED ORDER — ACETAMINOPHEN 325 MG PO TABS: 650.0000 mg | ORAL_TABLET | Freq: Four times a day (QID) | ORAL | Status: DC | PRN

## 2019-09-22 MED ORDER — IPRATROPIUM-ALBUTEROL 0.5-2.5 (3) MG/3ML IN SOLN
3.0000 mL | RESPIRATORY_TRACT | Status: DC
  Administered 2019-09-23 – 2019-09-25 (×12): 3 mL via RESPIRATORY_TRACT
  Filled 2019-09-22 (×13): qty 3

## 2019-09-22 MED ORDER — ONDANSETRON HCL 4 MG/2ML IJ SOLN: 4.0000 mg | Freq: Four times a day (QID) | INTRAMUSCULAR | Status: DC | PRN

## 2019-09-22 MED ORDER — TRAZODONE HCL 50 MG PO TABS
25.0000 mg | ORAL_TABLET | Freq: Every evening | ORAL | Status: DC | PRN
  Administered 2019-09-22 – 2019-09-26 (×4): 25 mg via ORAL
  Filled 2019-09-22 (×5): qty 1

## 2019-09-22 MED ORDER — OXYBUTYNIN CHLORIDE ER 5 MG PO TB24
5.0000 mg | ORAL_TABLET | Freq: Every day | ORAL | Status: DC
  Administered 2019-09-22 – 2019-09-26 (×5): 5 mg via ORAL
  Filled 2019-09-22 (×6): qty 1

## 2019-09-22 MED ORDER — SERTRALINE HCL 50 MG PO TABS
200.0000 mg | ORAL_TABLET | Freq: Two times a day (BID) | ORAL | Status: DC
  Administered 2019-09-22 – 2019-09-27 (×10): 200 mg via ORAL
  Filled 2019-09-22 (×10): qty 4

## 2019-09-22 MED ORDER — ROSUVASTATIN CALCIUM 5 MG PO TABS
5.0000 mg | ORAL_TABLET | Freq: Every day | ORAL | Status: DC
  Administered 2019-09-22 – 2019-09-27 (×6): 5 mg via ORAL
  Filled 2019-09-22 (×6): qty 1

## 2019-09-22 MED ORDER — ONDANSETRON 4 MG PO TBDP: 8.0000 mg | ORAL_TABLET | Freq: Three times a day (TID) | ORAL | Status: DC | PRN

## 2019-09-22 MED ORDER — METOPROLOL SUCCINATE ER 50 MG PO TB24
50.0000 mg | ORAL_TABLET | Freq: Every day | ORAL | Status: DC
  Administered 2019-09-22 – 2019-09-27 (×6): 50 mg via ORAL
  Filled 2019-09-22 (×6): qty 1

## 2019-09-22 MED ORDER — ENOXAPARIN SODIUM 40 MG/0.4ML ~~LOC~~ SOLN
40.0000 mg | SUBCUTANEOUS | Status: DC
  Administered 2019-09-22 – 2019-09-26 (×5): 40 mg via SUBCUTANEOUS
  Filled 2019-09-22 (×5): qty 0.4

## 2019-09-22 MED ORDER — IPRATROPIUM-ALBUTEROL 0.5-2.5 (3) MG/3ML IN SOLN
RESPIRATORY_TRACT | Status: AC
  Administered 2019-09-22: 21:00:00
  Filled 2019-09-22: qty 3

## 2019-09-22 MED ORDER — METHYLPREDNISOLONE SODIUM SUCC 125 MG IJ SOLR: 80.0000 mg | INTRAMUSCULAR | Status: DC

## 2019-09-22 MED ORDER — LABETALOL HCL 5 MG/ML IV SOLN: 20.0000 mg | INTRAVENOUS | Status: DC | PRN

## 2019-09-22 MED ORDER — PANTOPRAZOLE SODIUM 40 MG PO TBEC
40.0000 mg | DELAYED_RELEASE_TABLET | Freq: Every day | ORAL | Status: DC
  Administered 2019-09-22 – 2019-09-27 (×6): 40 mg via ORAL
  Filled 2019-09-22 (×6): qty 1

## 2019-09-22 MED ORDER — SODIUM CHLORIDE 0.9 % IV SOLN
INTRAVENOUS | Status: DC
  Administered 2019-09-22: 75 mL/h via INTRAVENOUS

## 2019-09-22 MED ORDER — SODIUM CHLORIDE 0.9 % IV SOLN
1.0000 g | INTRAVENOUS | Status: DC
  Filled 2019-09-22: qty 10

## 2019-09-22 MED ORDER — ONDANSETRON HCL 4 MG PO TABS
4.0000 mg | ORAL_TABLET | Freq: Four times a day (QID) | ORAL | Status: DC | PRN
  Administered 2019-09-22: 4 mg via ORAL
  Filled 2019-09-22: qty 1

## 2019-09-22 MED ORDER — MAGNESIUM HYDROXIDE 400 MG/5ML PO SUSP
30.0000 mL | Freq: Every day | ORAL | Status: DC | PRN
  Administered 2019-09-25: 30 mL via ORAL
  Filled 2019-09-22: qty 30

## 2019-09-22 MED ORDER — METHYLPREDNISOLONE SODIUM SUCC 40 MG IJ SOLR
40.0000 mg | Freq: Two times a day (BID) | INTRAMUSCULAR | Status: DC
  Administered 2019-09-22 – 2019-09-23 (×2): 40 mg via INTRAVENOUS
  Filled 2019-09-22 (×2): qty 1

## 2019-09-22 MED ORDER — ACETAMINOPHEN 650 MG RE SUPP: 650.0000 mg | Freq: Four times a day (QID) | RECTAL | Status: DC | PRN

## 2019-09-22 MED ORDER — FUROSEMIDE 20 MG PO TABS
20.0000 mg | ORAL_TABLET | ORAL | Status: DC | PRN
  Administered 2019-09-22: 20 mg via ORAL
  Filled 2019-09-22 (×2): qty 0.5

## 2019-09-22 MED ORDER — SODIUM CHLORIDE 0.9 % IV SOLN
500.0000 mg | INTRAVENOUS | Status: DC
  Administered 2019-09-22: 500 mg via INTRAVENOUS
  Filled 2019-09-22: qty 500

## 2019-09-22 MED ORDER — INSULIN ASPART 100 UNIT/ML ~~LOC~~ SOLN
0.0000 [IU] | Freq: Three times a day (TID) | SUBCUTANEOUS | Status: DC
  Administered 2019-09-23 (×2): 3 [IU] via SUBCUTANEOUS
  Administered 2019-09-24 – 2019-09-27 (×7): 2 [IU] via SUBCUTANEOUS
  Filled 2019-09-22 (×9): qty 1

## 2019-09-22 MED ORDER — CEPHALEXIN 500 MG PO CAPS
500.0000 mg | ORAL_CAPSULE | Freq: Four times a day (QID) | ORAL | Status: DC
  Administered 2019-09-23 (×2): 500 mg via ORAL
  Filled 2019-09-22 (×3): qty 1

## 2019-09-22 MED ORDER — IPRATROPIUM-ALBUTEROL 0.5-2.5 (3) MG/3ML IN SOLN: 3.0000 mL | Freq: Four times a day (QID) | RESPIRATORY_TRACT | Status: DC

## 2019-09-22 MED ORDER — ALPRAZOLAM 0.5 MG PO TABS
0.5000 mg | ORAL_TABLET | Freq: Three times a day (TID) | ORAL | Status: DC | PRN
  Administered 2019-09-22 – 2019-09-27 (×4): 0.5 mg via ORAL
  Filled 2019-09-22 (×5): qty 1

## 2019-09-22 MED ORDER — GUAIFENESIN ER 600 MG PO TB12
600.0000 mg | ORAL_TABLET | Freq: Two times a day (BID) | ORAL | Status: DC
  Administered 2019-09-22 – 2019-09-27 (×10): 600 mg via ORAL
  Filled 2019-09-22 (×10): qty 1

## 2019-09-22 MED ORDER — HYDROCOD POLST-CPM POLST ER 10-8 MG/5ML PO SUER: 5.0000 mL | Freq: Two times a day (BID) | ORAL | Status: DC | PRN

## 2019-09-22 NOTE — ED Notes (Signed)
Pt's color is much better, and more aware of her surroundings.

## 2019-09-22 NOTE — ED Provider Notes (Signed)
[facility] Emergency Department Provider Note ____________________________________________   First MD Initiated Contact with Patient 09/22/19 1534     (approximate)  I have reviewed the triage vital signs and the nursing notes.   HISTORY  Chief Complaint Fatigue and Shortness of Breath  Level 5 caveat: History of present illness limited due to altered mental status  HPI Tami CouncilMary H Fojtik is a 73 y.o. female with PMH as noted below including COPD who presents referred from the pulmonary clinic for worsening COPD and hypercapnia.  The patient reports that she feels short of breath, tired, and sleepy.  She denies any acute pain.  Past Medical History:  Diagnosis Date  . Anxiety   . Asthmatic bronchitis   . COPD (chronic obstructive pulmonary disease) (HCC)   . Depression   . Diabetes mellitus   . Diverticulitis   . Diverticulosis   . Hyperlipidemia   . Hypertension   . Osteopenia   . Sleep apnea   . Stress-induced cardiomyopathy    a. echo 09/28/2014: EF 45-50%, severe HK of mid-distal anterior, apical and mid-distal inferior walls suggestive of stress induced CM, mildly dilated LA, mildly dilated PASP, mild TR, trivial pericardial effusion    Patient Active Problem List   Diagnosis Date Noted  . Acute respiratory failure with hypoxia and hypercapnia (HCC) 09/22/2019  . NSTEMI (non-ST elevated myocardial infarction) (HCC) 10/06/2014  . Stress-induced cardiomyopathy   . Unspecified vitamin D deficiency 05/19/2014  . Dyshidrotic eczema 10/11/2013  . BPV (benign positional vertigo) 08/02/2013  . Essential hypertension, benign 03/31/2013  . Solitary pulmonary nodule 12/20/2012  . Postmenopausal estrogen deficiency 09/07/2012  . Anxiety and depression 11/24/2011  . Diabetes mellitus type 2, controlled (HCC) 09/04/2011  . COPD (chronic obstructive pulmonary disease) (HCC) 09/04/2011  . OSA (obstructive sleep apnea) 02/17/2007  . Hyperlipidemia  04/04/2003    Past Surgical History:  Procedure Laterality Date  . ABDOMINAL HYSTERECTOMY     partial  . BREAST BIOPSY Right    neg  . CARDIAC CATHETERIZATION  09/29/2014   [facility]  . NASAL SINUS SURGERY      Prior to Admission medications   Medication Sig Start Date End Date Taking? Authorizing Provider  albuterol (PROVENTIL HFA;VENTOLIN HFA) 108 (90 Base) MCG/ACT inhaler INHALE 2 PUFFS INTO THE LUNGS EVERY 4 (FOUR) HOURS AS NEEDED FOR WHEEZING OR SHORTNESS OF BREATH. 11/22/18   Merwyn KatosSimonds, David B, MD  albuterol (PROVENTIL) (2.5 MG/3ML) 0.083% nebulizer solution Take 3 mLs (2.5 mg total) by nebulization every 6 (six) hours as needed for wheezing or shortness of breath. J44.9 11/22/18   Merwyn KatosSimonds, David B, MD  ALPRAZolam Prudy Feeler(XANAX) 0.5 MG tablet Take 1 tablet (0.5 mg total) by mouth 3 (three) times daily as needed for anxiety. 09/19/19   Lorre MunroeBaity, Regina W, NP  azithromycin (ZITHROMAX Z-PAK) 250 MG tablet Take 2 tablets on Day 1 and then 1 tablet daily till gone. 08/18/19   Erin FullingKasa, Kurian, MD  budesonide (PULMICORT) 0.25 MG/2ML nebulizer solution Take 2 mLs (0.25 mg total) by nebulization 2 (two) times daily. Patient not taking: Reported on 09/22/2019 07/05/19 07/04/20  Merwyn KatosSimonds, David B, MD  busPIRone (BUSPAR) 10 MG tablet Take 1 tablet (10 mg total) by mouth 3 (three) times daily. 08/15/19   Lorre MunroeBaity, Regina W, NP  Cholecalciferol (VITAMIN D-3) 1000 UNITS CAPS Take 2 capsules by mouth.     [provider]  conjugated estrogens (PREMARIN) vaginal cream Apply 0.5g (pea-sized amount) just inside the vaginal introitus with a finger-tip on  Monday,Wednesday and Friday night 06/07/19   Jearld Fenton, NP  furosemide (LASIX) 20 MG tablet Take 1 tablet (20 mg total) by mouth as needed for edema. 10/13/18   Wellington Hampshire, MD  metoprolol succinate (TOPROL-XL) 50 MG 24 hr tablet TAKE 1 TABLET (50 MG TOTAL) BY MOUTH DAILY. TAKE WITH OR IMMEDIATELY FOLLOWING A MEAL. 07/07/19 10/05/19  Wellington Hampshire, MD  nystatin  (MYCOSTATIN/NYSTOP) powder Apply topically 3 (three) times daily. 07/21/19   Jearld Fenton, NP  omeprazole (PRILOSEC) 40 MG capsule Take 1 capsule (40 mg total) by mouth 2 (two) times daily. 07/21/19   Jearld Fenton, NP  ondansetron (ZOFRAN ODT) 8 MG disintegrating tablet Take 1 tablet (8 mg total) by mouth every 8 (eight) hours as needed for nausea or vomiting. 08/19/19   Jearld Fenton, NP  ONE TOUCH ULTRA TEST test strip 1 EACH BY OTHER ROUTE DAILY AS NEEDED FOR OTHER. 09/01/18   Jearld Fenton, NP  oxybutynin (DITROPAN-XL) 5 MG 24 hr tablet TAKE 1 TABLET BY MOUTH EVERYDAY AT BEDTIME 08/29/19   Jearld Fenton, NP  rosuvastatin (CRESTOR) 5 MG tablet TAKE 1 TABLET BY MOUTH EVERY DAY 09/07/19   Jearld Fenton, NP  sertraline (ZOLOFT) 100 MG tablet TAKE 2 TABLETS BY MOUTH TWICE A DAY 07/27/19   Jearld Fenton, NP  tiotropium (SPIRIVA HANDIHALER) 18 MCG inhalation capsule Place 1 capsule (18 mcg total) into inhaler and inhale daily. 10/22/18   Wilhelmina Mcardle, MD  WIXELA INHUB 250-50 MCG/DOSE AEPB 1 puff 2 (two) times daily. 08/07/19   [provider]    Allergies Amoxicillin-pot clavulanate, Daliresp [roflumilast], Erythromycin base, and Neurontin [gabapentin]  Family History  Problem Relation Age of Onset  . Diabetes Daughter   . Kidney disease Daughter   . Lung cancer Mother        smoked  . COPD Mother        smoked  . COPD Father        smoked  . Prostate cancer Father   . Kidney failure Father   . Breast cancer Maternal Aunt   . Diabetes Maternal Grandmother   . Breast cancer Maternal Grandmother   . Cancer Cousin        colon    Social History Social History   Tobacco Use  . Smoking status: Former Smoker    Packs/day: 0.25    Years: 40.00    Pack years: 10.00    Types: Cigarettes    Quit date: 09/01/2016    Years since quitting: 3.0  . Smokeless tobacco: Never Used  . Tobacco comment: Uses nicotrol inhaler on occasion  Substance Use Topics  . Alcohol  use: No  . Drug use: No    Review of Systems Level 5 caveat: Unable to obtain review of systems due to altered mental status    ____________________________________________   PHYSICAL EXAM:  VITAL SIGNS: ED Triage Vitals  Enc Vitals Group     BP 09/22/19 1423 (!) 149/91     Pulse Rate 09/22/19 1423 99     Resp 09/22/19 1423 (!) 24     Temp 09/22/19 1423 99 F (37.2 C)     Temp Source 09/22/19 1423 Oral     SpO2 09/22/19 1423 93 %     Weight 09/22/19 1424 156 lb (70.8 kg)     Height 09/22/19 1424 5\' 4"  (1.626 m)     Head Circumference --      Peak  Flow --      Pain Score 09/22/19 1424 0     Pain Loc --      Pain Edu? --      Excl. in GC? --     Constitutional: Alert and oriented.  Tired appearing but in no acute distress. Eyes: Conjunctivae are normal.  Head: Atraumatic. Nose: No congestion/rhinnorhea. Mouth/Throat: Mucous membranes are slightly dry.   Neck: Normal range of motion.  Cardiovascular: Normal rate, regular rhythm. Grossly normal heart sounds.  Good peripheral circulation. Respiratory: No acute respiratory distress.  Markedly decreased breath sounds bilaterally. Gastrointestinal: No distention.  Musculoskeletal: No lower extremity edema.  Extremities warm and well perfused.  Neurologic:  Normal speech and language. No gross focal neurologic deficits are appreciated.  Skin:  Skin is warm and dry. No rash noted. Psychiatric: Calm and cooperative.  ____________________________________________   LABS (all labs ordered are listed, but only abnormal results are displayed)  Labs Reviewed  CBC - Abnormal; Notable for the following components:      Result Value   HCT 47.5 (*)    All other components within normal limits  COMPREHENSIVE METABOLIC PANEL - Abnormal; Notable for the following components:   Chloride 90 (*)    CO2 37 (*)    Glucose, Bld 139 (*)    Total Protein 8.4 (*)    All other components within normal limits  BLOOD GAS, VENOUS -  Abnormal; Notable for the following components:   pH, Ven 7.21 (*)    pCO2, Ven 112 (*)    pO2, Ven <31.0 (*)    Bicarbonate 44.8 (*)    Acid-Base Excess 12.0 (*)    All other components within normal limits  BLOOD GAS, ARTERIAL - Abnormal; Notable for the following components:   pH, Arterial 7.28 (*)    pCO2 arterial 91 (*)    pO2, Arterial 62 (*)    Bicarbonate 42.8 (*)    Acid-Base Excess 11.9 (*)    All other components within normal limits  SARS CORONAVIRUS 2 BY RT PCR (HOSPITAL ORDER, PERFORMED IN [facility])  TROPONIN I (HIGH SENSITIVITY)   ____________________________________________  EKG  ED ECG REPORT I, Dionne Bucy, the attending physician, personally viewed and interpreted this ECG.  Date: 09/22/2019 EKG Time: 1436 Rate: 96 Rhythm: normal sinus rhythm QRS Axis: Left axis Intervals: normal ST/T Wave abnormalities: Nonspecific ST abnormalities  Narrative Interpretation: Nonspecific abnormalities with no evidence of acute ischemia  ____________________________________________  RADIOLOGY  CXR: No focal infiltrate or other acute abnormality  ____________________________________________   PROCEDURES  Procedure(s) performed: No  Procedures  Critical Care performed: Yes  CRITICAL CARE Performed by: Dionne Bucy   Total critical care time: 40 minutes  Critical care time was exclusive of separately billable procedures and treating other patients.  Critical care was necessary to treat or prevent imminent or life-threatening deterioration.  Critical care was time spent personally by me on the following activities: development of treatment plan with patient and/or surrogate as well as nursing, discussions with consultants, evaluation of patient's response to treatment, examination of patient, obtaining history from patient or surrogate, ordering and performing treatments and interventions, ordering and review of laboratory studies,  ordering and review of radiographic studies, pulse oximetry and re-evaluation of patient's condition. ____________________________________________   INITIAL IMPRESSION / ASSESSMENT AND PLAN / ED COURSE  Pertinent labs & imaging results that were available during my care of the patient were reviewed by me and considered in my medical decision making (see  chart for details).  72 year old female with a history of COPD presents with worsening sleepiness and weakness after being sent in from the pulmonary clinic for hypercapnia.  I reviewed the past medical records in Epic.  The patient was most recently seen in the ED in July with increased shortness of breath thought to be due to a COPD exacerbation.  She was seen at the [facility] pulmonary today with increased lethargy.  Labs confirm significant hypercapnia.  Per the note, "the patient desires to be DNR/DNI but will accept noninvasive ventilation."  On exam, the patient appears somewhat lethargic but is able to carry on a conversation and is speaking in full sentences.  O2 saturation is 100% on 3 L by nasal cannula.  She has markedly decreased breath sounds bilaterally.  The remainder of the exam is as described above.  Overall presentation is consistent with acute on chronic respiratory failure.  Based on recommendations from pulmonary, we will place the patient on BiPAP.  Chest x-ray shows no evidence of pneumonia.  The patient is afebrile and there are no signs or symptoms to suggest COVID-19.  We will plan for admission.  ----------------------------------------- 8:00 PM on 09/22/2019 -----------------------------------------  The patient has been stable on the BiPAP, with O2 saturation in the low 90s.  She continues to be somewhat sleepy but is arousable to voice and states she is feeling better.  COVID-19 swab is negative.  I have discussed the patient with hospitalist Dr. Arville Care for admission.  ________________________________  Tami Skinner was evaluated in Emergency Department on 09/22/2019 for the symptoms described in the history of present illness. She was evaluated in the context of the global COVID-19 pandemic, which necessitated consideration that the patient might be at risk for infection with the SARS-CoV-2 virus that causes COVID-19. Institutional protocols and algorithms that pertain to the evaluation of patients at risk for COVID-19 are in a state of rapid change based on information released by regulatory bodies including the CDC and federal and state organizations. These policies and algorithms were followed during the patient's care in the ED.  ____________________________________________   FINAL CLINICAL IMPRESSION(S) / ED DIAGNOSES  Final diagnoses:  Acute on chronic respiratory failure with hypoxia and hypercapnia (HCC)      NEW MEDICATIONS STARTED DURING THIS VISIT:  New Prescriptions   No medications on file     Note:  This document was prepared using Dragon voice recognition software and may include unintentional dictation errors.    Dionne Bucy, MD 09/22/19 2000

## 2019-09-22 NOTE — H&P (Signed)
[place] [facility]   PATIENT NAME: Tami Skinner    MR#:  [id number]  DATE OF BIRTH:  03/02/1946  DATE OF ADMISSION:  09/22/2019  PRIMARY CARE PHYSICIAN: Lorre MunroeBaity, Regina W, NP   REQUESTING/REFERRING PHYSICIAN: Dionne BucySiadecki, Sebastian, MD CHIEF COMPLAINT:   Chief Complaint  Patient presents with  . Fatigue  . Shortness of Breath    HISTORY OF PRESENT ILLNESS:  Tami NeasMary Matthes  is a 73 y.o. Caucasian female with a known history of COPD, type II diabetes mellitus, hypertension, dyslipidemia and obstructive sleep apnea, who presented to the emergency room with acute onset of altered mental status with worsening somnolence, lethargy and generalized weakness who was sent from pulmonary clinic for hypercarbia.  The patient has been experiencing worsening cough with associated wheezing and dyspnea, with expectoration of whitish and yellowish sputum over the last 3 days.  She denies any fever or chills.  No orthopnea or paroxysmal nocturnal dyspnea.  She denies any worsening lower extremity edema.  She admits to nausea without vomiting.  No chest pain or palpitations.  No abdominal pain or melena or bright red bleeding per rectum.  No other bleeding diathesis.  No headache or dizziness or blurred vision.  Upon presentation to the emergency room, blood pressure was 149/91 with a pulse of 99 respiratory to 24 and temperature 99 with pulse oximetry of 93%.  ABG showed pH 7.28 with PCO2 of 91, PO2 of 62, HCO3 of 42.8 and O2 sat of 88.2% on 2 L O2 by nasal cannula.  CMP was unremarkable except for CO2 37 and CBC was within normal.  Two-view chest x-ray showed no active cardiopulmonary disease.  EKG showed normal sinus rhythm with rate 96 with left axis deviation and minimal voltage criteria for LVH.  Her COVID-19 rapid test came back negative.  The patient was placed on BiPAP with some improvement of her mental status.  She will be admitted to a stepdown unit for further evaluation and  management.   PAST MEDICAL HISTORY:   Past Medical History:  Diagnosis Date  . Anxiety   . Asthmatic bronchitis   . COPD (chronic obstructive pulmonary disease) (HCC)   . Depression   . Diabetes mellitus   . Diverticulitis   . Diverticulosis   . Hyperlipidemia   . Hypertension   . Osteopenia   . Sleep apnea   . Stress-induced cardiomyopathy    a. echo 09/28/2014: EF 45-50%, severe HK of mid-distal anterior, apical and mid-distal inferior walls suggestive of stress induced CM, mildly dilated LA, mildly dilated PASP, mild TR, trivial pericardial effusion    PAST SURGICAL HISTORY:   Past Surgical History:  Procedure Laterality Date  . ABDOMINAL HYSTERECTOMY     partial  . BREAST BIOPSY Right    neg  . CARDIAC CATHETERIZATION  09/29/2014   [facility]  . NASAL SINUS SURGERY      SOCIAL HISTORY:   Social History   Tobacco Use  . Smoking status: Former Smoker    Packs/day: 0.25    Years: 40.00    Pack years: 10.00    Types: Cigarettes    Quit date: 09/01/2016    Years since quitting: 3.0  . Smokeless tobacco: Never Used  . Tobacco comment: Uses nicotrol inhaler on occasion  Substance Use Topics  . Alcohol use: No    FAMILY HISTORY:   Family History  Problem Relation Age of Onset  . Diabetes Daughter   . Kidney disease Daughter   . Lung  cancer Mother        smoked  . COPD Mother        smoked  . COPD Father        smoked  . Prostate cancer Father   . Kidney failure Father   . Breast cancer Maternal Aunt   . Diabetes Maternal Grandmother   . Breast cancer Maternal Grandmother   . Cancer Cousin        colon    DRUG ALLERGIES:   Allergies  Allergen Reactions  . Amoxicillin-Pot Clavulanate     REACTION: stomach sensitive.  Lenice Llamas [Roflumilast]     Severe nausea   . Erythromycin Base     REACTION: GI upset  . Neurontin [Gabapentin]     anxiety    REVIEW OF SYSTEMS:   ROS As per history of present illness. All pertinent systems were reviewed  above. Constitutional,  HEENT, cardiovascular, respiratory, GI, GU, musculoskeletal, neuro, psychiatric, endocrine,  integumentary and hematologic systems were reviewed and are otherwise  negative/unremarkable except for positive findings mentioned above in the HPI.   MEDICATIONS AT HOME:   Prior to Admission medications   Medication Sig Start Date End Date Taking? Authorizing Provider  albuterol (PROVENTIL HFA;VENTOLIN HFA) 108 (90 Base) MCG/ACT inhaler INHALE 2 PUFFS INTO THE LUNGS EVERY 4 (FOUR) HOURS AS NEEDED FOR WHEEZING OR SHORTNESS OF BREATH. 11/22/18   Merwyn Katos, MD  albuterol (PROVENTIL) (2.5 MG/3ML) 0.083% nebulizer solution Take 3 mLs (2.5 mg total) by nebulization every 6 (six) hours as needed for wheezing or shortness of breath. J44.9 11/22/18   Merwyn Katos, MD  ALPRAZolam Prudy Feeler) 0.5 MG tablet Take 1 tablet (0.5 mg total) by mouth 3 (three) times daily as needed for anxiety. 09/19/19   Lorre Munroe, NP  azithromycin (ZITHROMAX Z-PAK) 250 MG tablet Take 2 tablets on Day 1 and then 1 tablet daily till gone. 08/18/19   Erin Fulling, MD  budesonide (PULMICORT) 0.25 MG/2ML nebulizer solution Take 2 mLs (0.25 mg total) by nebulization 2 (two) times daily. Patient not taking: Reported on 09/22/2019 07/05/19 07/04/20  Merwyn Katos, MD  busPIRone (BUSPAR) 10 MG tablet Take 1 tablet (10 mg total) by mouth 3 (three) times daily. 08/15/19   Lorre Munroe, NP  Cholecalciferol (VITAMIN D-3) 1000 UNITS CAPS Take 2 capsules by mouth.     [provider]  conjugated estrogens (PREMARIN) vaginal cream Apply 0.5g (pea-sized amount) just inside the vaginal introitus with a finger-tip on Monday,Wednesday and Friday night 06/07/19   Lorre Munroe, NP  furosemide (LASIX) 20 MG tablet Take 1 tablet (20 mg total) by mouth as needed for edema. 10/13/18   Iran Ouch, MD  metoprolol succinate (TOPROL-XL) 50 MG 24 hr tablet TAKE 1 TABLET (50 MG TOTAL) BY MOUTH DAILY. TAKE WITH OR  IMMEDIATELY FOLLOWING A MEAL. 07/07/19 10/05/19  Iran Ouch, MD  nystatin (MYCOSTATIN/NYSTOP) powder Apply topically 3 (three) times daily. 07/21/19   Lorre Munroe, NP  omeprazole (PRILOSEC) 40 MG capsule Take 1 capsule (40 mg total) by mouth 2 (two) times daily. 07/21/19   Lorre Munroe, NP  ondansetron (ZOFRAN ODT) 8 MG disintegrating tablet Take 1 tablet (8 mg total) by mouth every 8 (eight) hours as needed for nausea or vomiting. 08/19/19   Lorre Munroe, NP  ONE TOUCH ULTRA TEST test strip 1 EACH BY OTHER ROUTE DAILY AS NEEDED FOR OTHER. 09/01/18   Lorre Munroe, NP  oxybutynin (DITROPAN-XL) 5  MG 24 hr tablet TAKE 1 TABLET BY MOUTH EVERYDAY AT BEDTIME 08/29/19   Jearld Fenton, NP  rosuvastatin (CRESTOR) 5 MG tablet TAKE 1 TABLET BY MOUTH EVERY DAY 09/07/19   Jearld Fenton, NP  sertraline (ZOLOFT) 100 MG tablet TAKE 2 TABLETS BY MOUTH TWICE A DAY 07/27/19   Jearld Fenton, NP  tiotropium (SPIRIVA HANDIHALER) 18 MCG inhalation capsule Place 1 capsule (18 mcg total) into inhaler and inhale daily. 10/22/18   Wilhelmina Mcardle, MD  WIXELA INHUB 250-50 MCG/DOSE AEPB 1 puff 2 (two) times daily. 08/07/19   [provider]      VITAL SIGNS:  Blood pressure (!) 176/85, pulse (!) 102, temperature 99 F (37.2 C), temperature source Oral, resp. rate (!) 22, height 5\' 4"  (1.626 m), weight 70.8 kg, SpO2 92 %.  PHYSICAL EXAMINATION:  Physical Exam  GENERAL:  73 y.o.-year-old Caucasian female patient lying in the bed with mild respiratory distress on BiPAP with conversational dyspnea. EYES: Pupils equal, round, reactive to light and accommodation. No scleral icterus. Extraocular muscles intact.  HEENT: Head atraumatic, normocephalic. Oropharynx and nasopharynx clear.  NECK:  Supple, no jugular venous distention. No thyroid enlargement, no tenderness.  LUNGS: Diffuse expiratory wheezes with tight expiratory airflow and harsh vesicular breathing CARDIOVASCULAR: Regular rate and  rhythm, S1, S2 normal. No murmurs, rubs, or gallops.  ABDOMEN: Soft, nondistended, nontender. Bowel sounds present. No organomegaly or mass.  EXTREMITIES: No pedal edema, cyanosis, or clubbing.  NEUROLOGIC: Cranial nerves II through XII are intact. Muscle strength 5/5 in all extremities. Sensation intact. Gait not checked.  PSYCHIATRIC: The patient is alert and oriented x 3.  Normal affect and good eye contact. SKIN: No obvious rash, lesion, or ulcer.   LABORATORY PANEL:   CBC Recent Labs  Lab 09/22/19 1432  WBC 7.8  HGB 14.5  HCT 47.5*  PLT 257   ------------------------------------------------------------------------------------------------------------------  Chemistries  Recent Labs  Lab 09/22/19 1432  NA 140  K 4.1  CL 90*  CO2 37*  GLUCOSE 139*  BUN 20  CREATININE 0.77  CALCIUM 10.2  AST 26  ALT 23  ALKPHOS 68  BILITOT 1.0   ------------------------------------------------------------------------------------------------------------------  Cardiac Enzymes No results for input(s): TROPONINI in the last 168 hours. ------------------------------------------------------------------------------------------------------------------  RADIOLOGY:  Dg Chest 2 View  Result Date: 09/22/2019 CLINICAL DATA:  Shortness of breath EXAM: CHEST - 2 VIEW COMPARISON:  May 07, 2019 FINDINGS: There is mild cardiomegaly. Aortic knob calcification. Both lungs are clear. No acute osseous abnormality. IMPRESSION: No active cardiopulmonary disease. Electronically Signed   By: Prudencio Pair M.D.   On: 09/22/2019 15:03      IMPRESSION AND PLAN:   1.  Acute hypoxic and hypercarbic respiratory failure secondary to severe COPD exacerbation.  The patient will be admitted to stepdown uni and will be placed on IV steroid therapy with IV Solu-Medrol as well as nebulized bronchodilator therapy with duonebs q.i.d. and q.4 hours p.r.n., mucolytic therapy with Mucinex and antibiotic therapy with IV  Rocephin and Zithromax.  Sputum Gram stain culture and sensitivity will be obtained.  O2 protocol will be followed.  We will hold her Wixela and continue her Pulmicort and Spiriva.  We will continue her on BiPAP and follow her ABG  2.  Acute bronchitis, likely the culprit for her COPD exacerbation.  Management as above.  3.  Hypertensive urgency.  We will continue Toprol-XL and place the patient on as needed IV labetalol.  4.  Type 2 diabetes mellitus.  The patient will be placed on supplement coverage with NovoLog.  5.  Dyslipidemia.  We will continue statin therapy.  6.  GERD.  PPI therapy will be resumed especially given current steroid therapy.  7.  DVT prophylaxis.  Subcutaneous Lovenox.  GI prophylaxis was addressed above.   All the records are reviewed and case discussed with ED provider. The plan of care was discussed in details with the patient (and family). I answered all questions. The patient agreed to proceed with the above mentioned plan. Further management will depend upon hospital course.   CODE STATUS: Full code  TOTAL TIME TAKING CARE OF THIS PATIENT: 60 minutes.    [name] M.D on 09/22/2019 at 8:12 PM  [facility]   From 7 PM-7 AM, contact night-coverage [email or web address]  CC: Primary care physician; Lorre Munroe, NP   Note: This dictation was prepared with Dragon dictation along with smaller phrase technology. Any transcriptional errors that result from this process are unintentional.

## 2019-09-22 NOTE — Progress Notes (Signed)
Subjective:    Patient ID: Tami Skinner, female    DOB: October 31, 1946, 73 y.o.   MRN: [id number]  PT PROFILE: 70 F former smoker (quit October 2017) previously followed by Dr Kendrick Fries for lung nodule and moderate COPD.  Subsequently followed by Dr. 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 CPAP intolerant  DATA: Spirometry 02/03/12: Moderate obstruction (FEV1 1.21 liters, 49% pred) CT chest 09/27/14: Stable right lower lobe pulmonary nodule over over 2 year interval consistent benign etiology PFTs 11/11/16: mod-severe obstruction (FEV1 0.95 liters, 44% pred), mild restriction, severe reduction in DLCO. Lung volumes are probably underestimated by inadequate test performance CXR 11/28/16: hyperinflation. NACPD  INTERVAL: Last seen by Dr. Sung Amabile  on 1 September.  Which was performed in person.  She was started on formoterol and budesonide at that time.  She was instructed to continue Spiriva HandiHaler.  Upon Dr. Sung Amabile departure she saw Dr. Belia Heman on 15 October as an acute visit via video conference.  Was treated for an exacerbation with a Z-Pak and prednisone.  Has failed to notice improvement.   HPI Patient is a 73 year old former smoker prior patient of Dr. Sung Amabile, most recently seen by Dr. Belia Heman.  She has had symptoms of a prolonged COPD exacerbation for which she first call the office on October 12.  She has had issues with insomnia at nighttime and excessive daytime sleepiness with difficulty to be aroused by the caretakers.  She was seen via video conference by Dr. Belia Heman on 15 October as noted above.  She was treated with Z-Pak and prednisone.  She is normally on 2 L nasal cannula O2 but presents today with 3 L nasal cannula O2.  She is supposed to be on Formoterol and budesonide as noted above however she seems quite befuddled by her  medications today.  It appears that she is back on Wixela (Advair generic) and Spiriva.  She is using albuterol nebulization treatments almost every 4 hours.  During her exam today she can barely stay awake.  She fumbles for her words.  She exhibits asterixis.  Presents with her caretaker today, caretaker states that she has not had any fevers, chills or sweats.  Cough has been productive of pale yellow sputum.  Symptoms have been worsening over the last 3 weeks.  She has now developed significant anorexia.   Review of Systems A 10 point review of systems was performed and it is as noted above otherwise negative.    Objective:   Physical Exam  BP 130/78 (BP Location: Left Arm, Cuff Size: Normal)   Pulse 85   Temp 97.7 F (36.5 C) (Temporal)   Ht 5\' 4"  (1.626 m)   Wt 156 lb (70.8 kg)   SpO2 100% Comment: on 3L  BMI 26.78 kg/m  Oxygen was decreased to 1-1/2 L/min with saturations maintaining at 96%. Gen: Flat affect, use of accessories of respiration, lethargic, drifts off to sleep during interview. HEENT: NCAT, sclerae white Neck: No JVD, trachea midline Lungs: Very distant breath sounds with poor air movement.  Distant prolonged expiratory wheezes diffusely, shallow respirations. Cardiovascular: Distant heart tones,RRR, no murmurs Abdomen: Soft, nontender, normal BS Ext: without clubbing, cyanosis, edema Neuro: No focal deficits, she does exhibit asterixis Skin: Limited exam, no lesions noted     Assessment & Plan:  1.  Very severe COPD class IV, with acute exacerbation: She has failed outpatient  therapy I suspect that she has significant CO2 retention by evidence of asterixis and increased lethargy.  Evidence of very poor air movement and bronchospasm on exam.  Recommend evaluation in the emergency room for admission and management of the same due to failed outpatient therapy.  2.  Acute on chronic hypoxemic/hypercarbic respiratory failure: She presented on 3 L of O2 via nasal  cannula with saturations in the 100% range, I suspect she has significant CO2 retention, decrease this to 1.5 L with saturations being maintained.  She will need arterial blood gas to determine level of CO2 retention.  May need noninvasive ventilation.  Note: The patient desires to be DNR/DNI but will accept noninvasive ventilation.  3.  Generalized anxiety disorder: This issue adds complexity to her management.  She is actually dependent on alprazolam by her records.  Caretaker confirms that she has to take this medication 3 times daily otherwise she "goes insane".  4.  DNR/DNI status: Consider palliative care consultation as inpatient.  Patient is amenable to management with noninvasive ventilation.  No escalation further than that.  Time of visit 40 minutes  C. Derrill Kay, MD [facility]   This note was dictated using voice recognition software/Dragon.  Despite best efforts to proofread, errors can occur which can change the meaning.  Any change was purely unintentional.

## 2019-09-22 NOTE — ED Notes (Signed)
Pt from lobby to room 26 with lethargy hx of copd with abnormal vbg (pt retaining co2). Pt in w/c and placed in bed with 2 person assist. Arouses with verbal stimulation - per her home care worker she lives at home with her husband and is on oxygen 3 liters normally. Pt states she has been having trouble staying awake x 3-4 days. Rt called for pt to be placed on bipap.

## 2019-09-22 NOTE — ED Notes (Signed)
ED TO INPATIENT HANDOFF REPORT  ED Nurse Name and Phone #: Maanvi Lecompte [phone]  S Name/Age/Gender Tami Skinner 73 y.o. female Room/Bed: ED26A/ED26A  Code Status   Code Status: Full Code  Home/SNF/Other Home Patient oriented to: self, place and time Is this baseline? Yes   Triage Complete: Triage complete  Chief Complaint sob  Triage Note Pt presents to ED via wheelchair from [facility] for SOB, pt states "I can't stay awake". Pt is able to answer questions, however is noted to fall asleep when not stimulated. Pt states she has not been able to stay awake x 3-4 days. Pt states was sent by pulmonary clinic for concerns regarding CO2 retention.    Allergies Allergies  Allergen Reactions  . Amoxicillin-Pot Clavulanate     REACTION: stomach sensitive.  Lenice Llamas. Daliresp [Roflumilast]     Severe nausea   . Erythromycin Base     REACTION: GI upset  . Neurontin [Gabapentin]     anxiety    Level of Care/Admitting Diagnosis ED Disposition    ED Disposition Condition Comment   Admit  [facility] [[id number]]  Level of Care: Stepdown [14]  Covid Evaluation: Confirmed COVID Negative  Diagnosis: Acute respiratory failure with hypoxia and hypercapnia [facility](HCC) [[id number]]  Admitting Physician: Hannah BeatMANSY, JAN A [[id number]]  Attending Physician: Hannah BeatMANSY, JAN A [[id number]]  Estimated length of stay: past midnight tomorrow  Certification:: I certify this patient will need inpatient services for at least 2 midnights  PT Class (Do Not Modify): Inpatient [101]  PT Acc Code (Do Not Modify): Private [1]       B Medical/Surgery History Past Medical History:  Diagnosis Date  . Anxiety   . Asthmatic bronchitis   . COPD (chronic obstructive pulmonary disease) (HCC)   . Depression   . Diabetes mellitus   . Diverticulitis   . Diverticulosis   . Hyperlipidemia   . Hypertension   . Osteopenia   . Sleep apnea   . Stress-induced cardiomyopathy    a. echo  09/28/2014: EF 45-50%, severe HK of mid-distal anterior, apical and mid-distal inferior walls suggestive of stress induced CM, mildly dilated LA, mildly dilated PASP, mild TR, trivial pericardial effusion   Past Surgical History:  Procedure Laterality Date  . ABDOMINAL HYSTERECTOMY     partial  . BREAST BIOPSY Right    neg  . CARDIAC CATHETERIZATION  09/29/2014   [facility]  . NASAL SINUS SURGERY       A IV Location/Drains/Wounds Patient Lines/Drains/Airways Status   Active Line/Drains/Airways    Name:   Placement date:   Placement time:   Site:   Days:   Peripheral IV 05/07/19 Right Antecubital   05/07/19    2057    Antecubital   138   External Urinary Catheter   09/22/19    1906    -   less than 1          Intake/Output Last 24 hours No intake or output data in the 24 hours ending 09/22/19 2020  Labs/Imaging Results for orders placed or performed during the hospital encounter of 09/22/19 (from the past 48 hour(s))  CBC     Status: Abnormal   Collection Time: 09/22/19  2:32 PM  Result Value Ref Range   WBC 7.8 4.0 - 10.5 K/uL   RBC 4.98 3.87 - 5.11 MIL/uL   Hemoglobin 14.5 12.0 - 15.0 g/dL   HCT 69.647.5 (H) 29.536.0 - 28.446.0 %   MCV 95.4 80.0 -  100.0 fL   MCH 29.1 26.0 - 34.0 pg   MCHC 30.5 30.0 - 36.0 g/dL   RDW 13.2 11.5 - 15.5 %   Platelets 257 150 - 400 K/uL   nRBC 0.0 0.0 - 0.2 %    Comment: Performed at [facility], [address] [place], [place] [address]  Comprehensive metabolic panel     Status: Abnormal   Collection Time: 09/22/19  2:32 PM  Result Value Ref Range   Sodium 140 135 - 145 mmol/L   Potassium 4.1 3.5 - 5.1 mmol/L   Chloride 90 (L) 98 - 111 mmol/L   CO2 37 (H) 22 - 32 mmol/L   Glucose, Bld 139 (H) 70 - 99 mg/dL   BUN 20 8 - 23 mg/dL   Creatinine, Ser 0.77 0.44 - 1.00 mg/dL   Calcium 10.2 8.9 - 10.3 mg/dL   Total Protein 8.4 (H) 6.5 - 8.1 g/dL   Albumin 5.0 3.5 - 5.0 g/dL   AST 26 15 - 41 U/L   ALT 23 0 - 44 U/L   Alkaline Phosphatase 68  38 - 126 U/L   Total Bilirubin 1.0 0.3 - 1.2 mg/dL   GFR calc non Af Amer >60 >60 mL/min   GFR calc Af Amer >60 >60 mL/min   Anion gap 13 5 - 15    Comment: Performed at [facility], [place], [place] [address]  Troponin I (High Sensitivity)     Status: None   Collection Time: 09/22/19  2:32 PM  Result Value Ref Range   Troponin I (High Sensitivity) 9 <18 ng/L    Comment: (NOTE) Elevated high sensitivity troponin I (hsTnI) values and significant  changes across serial measurements may suggest ACS but many other  chronic and acute conditions are known to elevate hsTnI results.  Refer to the "Links" section for chest pain algorithms and additional  guidance. Performed at [facility], [address] [place], [place] [address]   Blood gas, venous     Status: Abnormal   Collection Time: 09/22/19  2:32 PM  Result Value Ref Range   pH, Ven 7.21 (L) 7.250 - 7.430   pCO2, Ven 112 (HH) 44.0 - 60.0 mmHg    Comment: CRITICAL RESULT CALLED TO, READ BACK BY AND VERIFIED WITH: MONKS, MD AT 1510 ON 09/22/19 CM[facility],RRT    pO2, Ven <31.0 (LL) 32.0 - 45.0 mmHg    Comment: CRITICAL RESULT CALLED TO, READ BACK BY AND VERIFIED WITH:   Bicarbonate 44.8 (H) 20.0 - 28.0 mmol/L   Acid-Base Excess 12.0 (H) 0.0 - 2.0 mmol/L   O2 Saturation 34.2 %   Patient temperature 37.0    Collection site VEIN    Sample type VENOUS     Comment: Performed at [facility], [address] [place], [place] [address]  Blood gas, arterial     Status: Abnormal   Collection Time: 09/22/19  4:03 PM  Result Value Ref Range   FIO2 0.28    Delivery systems BILEVEL POSITIVE AIRWAY PRESSURE    LHR 12 resp/min   Inspiratory PAP 16    Expiratory PAP 8    pH, Arterial 7.28 (L) 7.350 - 7.450   pCO2 arterial 91 (HH) 32.0 - 48.0 mmHg    Comment: CRITICAL RESULT CALLED TO, READ BACK BY AND VERIFIED WITH: SAIDEKI, MD AT 6195 ON 09/22/19 CM[facility], RRT    pO2, Arterial 62 (L) 83.0 - 108.0  mmHg   Bicarbonate 42.8 (  H) 20.0 - 28.0 mmol/L   Acid-Base Excess 11.9 (H) 0.0 - 2.0 mmol/L   O2 Saturation 88.2 %   Patient temperature 37.0    Collection site RIGHT RADIAL    Sample type ARTERIAL DRAW    Allens test (pass/fail) PASS PASS    Comment: Performed at [facility], [address] [place], [place] [address]  SARS Coronavirus 2 by RT PCR (hospital order, performed in [facility] hospital lab) Nasopharyngeal Nasopharyngeal Swab     Status: None   Collection Time: 09/22/19  6:06 PM   Specimen: Nasopharyngeal Swab  Result Value Ref Range   SARS Coronavirus 2 NEGATIVE NEGATIVE    Comment: (NOTE) If result is NEGATIVE SARS-CoV-2 target nucleic acids are NOT DETECTED. The SARS-CoV-2 RNA is generally detectable in upper and lower  respiratory specimens during the acute phase of infection. The lowest  concentration of SARS-CoV-2 viral copies this assay can detect is 250  copies / mL. A negative result does not preclude SARS-CoV-2 infection  and should not be used as the sole basis for treatment or other  patient management decisions.  A negative result may occur with  improper specimen collection / handling, submission of specimen other  than nasopharyngeal swab, presence of viral mutation(s) within the  areas targeted by this assay, and inadequate number of viral copies  (<250 copies / mL). A negative result must be combined with clinical  observations, patient history, and epidemiological information. If result is POSITIVE SARS-CoV-2 target nucleic acids are DETECTED. The SARS-CoV-2 RNA is generally detectable in upper and lower  respiratory specimens dur ing the acute phase of infection.  Positive  results are indicative of active infection with SARS-CoV-2.  Clinical  correlation with patient history and other diagnostic information is  necessary to determine patient infection status.  Positive results do  not rule out bacterial infection or co-infection with  other viruses. If result is PRESUMPTIVE POSTIVE SARS-CoV-2 nucleic acids MAY BE PRESENT.   A presumptive positive result was obtained on the submitted specimen  and confirmed on repeat testing.  While 2019 novel coronavirus  (SARS-CoV-2) nucleic acids may be present in the submitted sample  additional confirmatory testing may be necessary for epidemiological  and / or clinical management purposes  to differentiate between  SARS-CoV-2 and other Sarbecovirus currently known to infect humans.  If clinically indicated additional testing with an alternate test  methodology [phone]) is advised. The SARS-CoV-2 RNA is generally  detectable in upper and lower respiratory sp ecimens during the acute  phase of infection. The expected result is Negative. Fact Sheet for Patients:  BoilerBrush.com.cy Fact Sheet for Healthcare Providers: https://pope.com/ This test is not yet approved or cleared by the [place] FDA and has been authorized for detection and/or diagnosis of SARS-CoV-2 by FDA under an Emergency Use Authorization (EUA).  This EUA will remain in effect (meaning this test can be used) for the duration of the COVID-19 declaration under Section 564(b)(1) of the Act, 21 U.S.C. section 360bbb-3(b)(1), unless the authorization is terminated or revoked sooner. Performed at [facility], [address] [place], [place] [address]    Dg Chest 2 View  Result Date: 09/22/2019 CLINICAL DATA:  Shortness of breath EXAM: CHEST - 2 VIEW COMPARISON:  May 07, 2019 FINDINGS: There is mild cardiomegaly. Aortic knob calcification. Both lungs are clear. No acute osseous abnormality. IMPRESSION: No active cardiopulmonary disease. Electronically Signed   By: Jonna Clark M.D.   On: 09/22/2019 15:03  Pending Labs Unresulted Labs (From admission, onward)    Start     Ordered   09/23/19 0500  Basic metabolic panel  Tomorrow morning,   STAT      09/22/19 2010   09/23/19 0500  CBC  Tomorrow morning,   STAT     09/22/19 2010   09/22/19 2008  Culture, sputum-assessment  Once,   STAT     09/22/19 2010   09/22/19 2008  Culture, blood (routine x 2) Call MD if unable to obtain prior to antibiotics being given  BLOOD CULTURE X 2,   STAT    Comments: If blood cultures drawn in Emergency Department - Do not draw and cancel order    09/22/19 2010          Vitals/Pain Today's Vitals   09/22/19 1800 09/22/19 1830 09/22/19 1900 09/22/19 1930  BP: (!) 146/63 (!) 162/70 (!) 157/83 (!) 176/85  Pulse: 86 100 (!) 101 (!) 102  Resp:      Temp:      TempSrc:      SpO2: 92% (!) 89% 91% 92%  Weight:      Height:      PainSc:        Isolation Precautions No active isolations  Medications Medications  furosemide (LASIX) tablet 20 mg (has no administration in time range)  metoprolol succinate (TOPROL-XL) 24 hr tablet 50 mg (has no administration in time range)  rosuvastatin (CRESTOR) tablet 5 mg (has no administration in time range)  ALPRAZolam (XANAX) tablet 0.5 mg (has no administration in time range)  busPIRone (BUSPAR) tablet 10 mg (has no administration in time range)  sertraline (ZOLOFT) tablet 200 mg (has no administration in time range)  pantoprazole (PROTONIX) EC tablet 40 mg (has no administration in time range)  ondansetron (ZOFRAN-ODT) disintegrating tablet 8 mg (has no administration in time range)  oxybutynin (DITROPAN-XL) 24 hr tablet 5 mg (has no administration in time range)  Vitamin D-3 CAPS 2,000 Units (has no administration in time range)  tiotropium (SPIRIVA) inhalation capsule ([facility] use ONLY) 18 mcg (has no administration in time range)  enoxaparin (LOVENOX) injection 40 mg (has no administration in time range)  0.9 %  sodium chloride infusion (has no administration in time range)  acetaminophen (TYLENOL) tablet 650 mg (has no administration in time range)    Or  acetaminophen (TYLENOL) suppository 650 mg (has no  administration in time range)  traZODone (DESYREL) tablet 25 mg (has no administration in time range)  magnesium hydroxide (MILK OF MAGNESIA) suspension 30 mL (has no administration in time range)  ondansetron (ZOFRAN) tablet 4 mg (has no administration in time range)    Or  ondansetron (ZOFRAN) injection 4 mg (has no administration in time range)  cefTRIAXone (ROCEPHIN) 1 g in sodium chloride 0.9 % 100 mL IVPB (has no administration in time range)  ipratropium-albuterol (DUONEB) 0.5-2.5 (3) MG/3ML nebulizer solution 3 mL (has no administration in time range)  azithromycin (ZITHROMAX) 500 mg in sodium chloride 0.9 % 250 mL IVPB (has no administration in time range)  guaiFENesin (MUCINEX) 12 hr tablet 600 mg (has no administration in time range)  chlorpheniramine-HYDROcodone (TUSSIONEX) 10-8 MG/5ML suspension 5 mL (has no administration in time range)    Mobility walks with device High fall risk   Focused Assessments Pulmonary Assessment Handoff:  Lung sounds:   O2 Device: Nasal Cannula O2 Flow Rate (L/min): 3 L/min      R Recommendations: See Admitting Provider Note  Report given to:  Additional Notes:

## 2019-09-22 NOTE — Consult Note (Addendum)
Name: Tami Skinner MRN: [id number] DOB: 01/23/1946    ADMISSION DATE:  09/22/2019 CONSULTATION DATE: 09/22/2019  REFERRING MD : Dr. Arville CareMansy  CHIEF COMPLAINT: Shortness of Breath   BRIEF PATIENT DESCRIPTION:  73 yo female DNR/DNI admitted with acute on chronic hypercapnic hypoxic respiratory failure secondary to AECOPD requiring Bipap   SIGNIFICANT EVENTS/STUDIES:  11/19: Pt admitted to the stepdown unit on Bipap   HISTORY OF PRESENT ILLNESS:   This is a 73 yo female with a PMH of Stress-Induced Cardiomyopathy (Echo 07/2017 EF 60% to 65% with grade 1 diastolic dysfunction), Stable Right Lower Lobe Pulmonary Nodule (CT Chest 09/2014), Former Smoker (quit Oct 2017), OSA (CPAP Intolerant), Osteopenia, HTN, Hyperlipidemia, Diverticulitis, Diverticulosis, Type II Diabetes Mellitus, Depression, COPD, Asthmatic Bronchitis, and Anxiety. She saw her outpatient pulmonologist Dr Francene BoyersGonzolez on 11/19 for evaluation of acute on chronic hypoxic respiratory failure, upon evaluation it was noted pt had very poor air movement, bronchospasm, and increased lethargy with evidence of asterixis secondary to very severe COPD (class IV) with acute exacerbation, therefore pt instructed to proceed to the ER for additional workup and treatment.  Upon arrival to the ER pt intermittently lethargic secondary to hypercapnia (VBG: pH 7.21/pCO2 112), therefore she was placed on Bipap.  COVID-19 and CXR negative.  She was subsequently admitted to the stepdown unit for additional workup and treatment.  PCCM consulted for management of acute on chronic hypoxic/hypercanic respiratory failure.    PAST MEDICAL HISTORY :   has a past medical history of Anxiety, Asthmatic bronchitis, COPD (chronic obstructive pulmonary disease) (HCC), Depression, Diabetes mellitus, Diverticulitis, Diverticulosis, Hyperlipidemia, Hypertension, Osteopenia, Sleep apnea, and Stress-induced cardiomyopathy.  has a past surgical history that includes Abdominal  hysterectomy; Nasal sinus surgery; Cardiac catheterization (09/29/2014); and Breast biopsy (Right). Prior to Admission medications   Medication Sig Start Date End Date Taking? Authorizing Provider  albuterol (PROVENTIL HFA;VENTOLIN HFA) 108 (90 Base) MCG/ACT inhaler INHALE 2 PUFFS INTO THE LUNGS EVERY 4 (FOUR) HOURS AS NEEDED FOR WHEEZING OR SHORTNESS OF BREATH. 11/22/18  Yes Merwyn KatosSimonds, David B, MD  albuterol (PROVENTIL) (2.5 MG/3ML) 0.083% nebulizer solution Take 3 mLs (2.5 mg total) by nebulization every 6 (six) hours as needed for wheezing or shortness of breath. J44.9 11/22/18  Yes Merwyn KatosSimonds, David B, MD  ALPRAZolam Prudy Feeler(XANAX) 0.5 MG tablet Take 1 tablet (0.5 mg total) by mouth 3 (three) times daily as needed for anxiety. 09/19/19  Yes Lorre MunroeBaity, Regina W, NP  buPROPion (WELLBUTRIN XL) 150 MG 24 hr tablet Take 150 mg by mouth daily.   Yes [provider]  busPIRone (BUSPAR) 10 MG tablet Take 1 tablet (10 mg total) by mouth 3 (three) times daily. 08/15/19  Yes Lorre MunroeBaity, Regina W, NP  Cholecalciferol (VITAMIN D-3) 1000 UNITS CAPS Take 2,000 Units by mouth daily.    Yes [provider]  conjugated estrogens (PREMARIN) vaginal cream Apply 0.5g (pea-sized amount) just inside the vaginal introitus with a finger-tip on Monday,Wednesday and Friday night 06/07/19  Yes Baity, Salvadore Oxfordegina W, NP  furosemide (LASIX) 20 MG tablet Take 1 tablet (20 mg total) by mouth as needed for edema. 10/13/18  Yes Iran OuchArida, Muhammad A, MD  metoprolol succinate (TOPROL-XL) 50 MG 24 hr tablet TAKE 1 TABLET (50 MG TOTAL) BY MOUTH DAILY. TAKE WITH OR IMMEDIATELY FOLLOWING A MEAL. 07/07/19 10/05/19 Yes Iran OuchArida, Muhammad A, MD  nystatin (MYCOSTATIN/NYSTOP) powder Apply topically 3 (three) times daily. 07/21/19  Yes Lorre MunroeBaity, Regina W, NP  omeprazole (PRILOSEC) 40 MG capsule Take 1 capsule (40 mg total)  by mouth 2 (two) times daily. 07/21/19  Yes Baity, Salvadore Oxford, NP  ondansetron (ZOFRAN ODT) 8 MG disintegrating tablet Take 1 tablet (8 mg total) by  mouth every 8 (eight) hours as needed for nausea or vomiting. 08/19/19  Yes Baity, Salvadore Oxford, NP  ONE TOUCH ULTRA TEST test strip 1 EACH BY OTHER ROUTE DAILY AS NEEDED FOR OTHER. 09/01/18  Yes Baity, Salvadore Oxford, NP  oxybutynin (DITROPAN-XL) 5 MG 24 hr tablet TAKE 1 TABLET BY MOUTH EVERYDAY AT BEDTIME Patient taking differently: Take 5 mg by mouth at bedtime.  08/29/19  Yes Baity, Salvadore Oxford, NP  rosuvastatin (CRESTOR) 5 MG tablet TAKE 1 TABLET BY MOUTH EVERY DAY Patient taking differently: Take 5 mg by mouth daily.  09/07/19  Yes Lorre Munroe, NP  tiotropium (SPIRIVA HANDIHALER) 18 MCG inhalation capsule Place 1 capsule (18 mcg total) into inhaler and inhale daily. 10/22/18  Yes Merwyn Katos, MD  WIXELA INHUB 250-50 MCG/DOSE AEPB 1 puff 2 (two) times daily. 08/07/19  Yes [provider]  azithromycin (ZITHROMAX Z-PAK) 250 MG tablet Take 2 tablets on Day 1 and then 1 tablet daily till gone. Patient not taking: Reported on 09/22/2019 08/18/19   Erin Fulling, MD  budesonide (PULMICORT) 0.25 MG/2ML nebulizer solution Take 2 mLs (0.25 mg total) by nebulization 2 (two) times daily. Patient not taking: Reported on 09/22/2019 07/05/19 07/04/20  Merwyn Katos, MD  sertraline (ZOLOFT) 100 MG tablet TAKE 2 TABLETS BY MOUTH TWICE A DAY 07/27/19   Lorre Munroe, NP   Allergies  Allergen Reactions  . Amoxicillin-Pot Clavulanate     REACTION: stomach sensitive.  Lenice Llamas [Roflumilast]     Severe nausea   . Erythromycin Base     REACTION: GI upset  . Neurontin [Gabapentin]     anxiety    FAMILY HISTORY:  family history includes Breast cancer in her maternal aunt and maternal grandmother; COPD in her father and mother; Cancer in her cousin; Diabetes in her daughter and maternal grandmother; Kidney disease in her daughter; Kidney failure in her father; Lung cancer in her mother; Prostate cancer in her father. SOCIAL HISTORY:  reports that she quit smoking about 3 years ago. Her smoking use  included cigarettes. She has a 10.00 pack-year smoking history. She has never used smokeless tobacco. She reports that she does not drink alcohol or use drugs.  REVIEW OF SYSTEMS: Positives in BOLD   Constitutional: fever, chills, weight loss, malaise/fatigue and diaphoresis.  HENT: Negative for hearing loss, ear pain, nosebleeds, congestion, sore throat, neck pain, tinnitus and ear discharge.   Eyes: Negative for blurred vision, double vision, photophobia, pain, discharge and redness.  Respiratory: cough, hemoptysis, sputum production, shortness of breath, wheezing and stridor.   Cardiovascular: Negative for chest pain, palpitations, orthopnea, claudication, leg swelling and PND.  Gastrointestinal: Negative for heartburn, nausea, vomiting, abdominal pain, diarrhea, constipation, blood in stool and melena.  Genitourinary: Negative for dysuria, urgency, frequency, hematuria and flank pain.  Musculoskeletal: Negative for myalgias, back pain, joint pain and falls.  Skin: Negative for itching and rash.  Neurological: Negative for dizziness, tingling, tremors, sensory change, speech change, focal weakness, seizures, loss of consciousness, weakness and headaches.  Endo/Heme/Allergies: Negative for environmental allergies and polydipsia. Does not bruise/bleed easily.  SUBJECTIVE:  No complaints at this time pt states "I want food"  VITAL SIGNS: Temp:  [97.7 F (36.5 C)-99 F (37.2 C)] 98 F (36.7 C) (11/19 2100) Pulse Rate:  [85-107] 107 (11/19 2055) Resp:  [  22-28] 28 (11/19 2055) BP: (108-185)/(47-96) 176/85 (11/19 1930) SpO2:  [88 %-100 %] 98 % (11/19 2108) Weight:  [69.4 kg-70.8 kg] 69.4 kg (11/19 2100)  PHYSICAL EXAMINATION: General: acutely ill appearing female, in respiratory distress on Bipap  Neuro: alert and oriented, follows commands  HEENT: supple, no JVD Cardiovascular: sinus tach, no R/G  Lungs: diminished throughout, even, non labored  Abdomen: +BS, soft, non distended, non  tender Musculoskeletal: no edema, normal bulk  Skin: bilateral lower extremity erythematous and hot   Recent Labs  Lab 09/22/19 1432  NA 140  K 4.1  CL 90*  CO2 37*  BUN 20  CREATININE 0.77  GLUCOSE 139*   Recent Labs  Lab 09/22/19 1432  HGB 14.5  HCT 47.5*  WBC 7.8  PLT 257   Dg Chest 2 View  Result Date: 09/22/2019 CLINICAL DATA:  Shortness of breath EXAM: CHEST - 2 VIEW COMPARISON:  May 07, 2019 FINDINGS: There is mild cardiomegaly. Aortic knob calcification. Both lungs are clear. No acute osseous abnormality. IMPRESSION: No active cardiopulmonary disease. Electronically Signed   By: Prudencio Pair M.D.   On: 09/22/2019 15:03    ASSESSMENT / PLAN:  Acute on chronic hypoxic hypercapnic respiratory failure secondary to AECOPD Hx: OSA and Asthmatic Bronchitis Prn Bipap for dyspnea and hypercapnia  IV and nebulized steroids  Scheduled and prn bronchodilator therapy Continue azithromycin   Trend WBC and monitor fever  Follow cultures  Trend PCT  Uncontrolled Hypertension  Hx: Chronic Diastolic CHF, Hyperlipidemia and Stress-Induced Cardiomyopathy  Continuous telemetry monitoring  Continue outpatient cardiac medications  Prn labetalol for bp management   Suspected bilateral lower extremities cellulitis  Trend WBC and monitor fever  Follow cultures  Trend PCT Will start cephalexin Bilateral venous US of lower extremity pending   Type II Diabetes Mellitus  CBG's ac/hs  SSI   GERD Continue po protonix   Depression/Anxiety  Continue zoloft and prn xanax    VTE px: subq lovenox   Marda Stalker, Jessie Pager [phone] (please enter 7 digits) PCCM Consult Pager [phone] (please enter 7 digits)

## 2019-09-22 NOTE — Patient Instructions (Signed)
Will require admission to the hospital.  Please present to the emergency room.

## 2019-09-22 NOTE — ED Notes (Signed)
Pt's personal oxygen tank placed at bedside by this RN. Manuela Schwartz Primary RN made aware.

## 2019-09-22 NOTE — ED Triage Notes (Signed)
Pt presents to ED via wheelchair from [facility] for SOB, pt states "I can't stay awake". Pt is able to answer questions, however is noted to fall asleep when not stimulated. Pt states she has not been able to stay awake x 3-4 days. Pt states was sent by pulmonary clinic for concerns regarding CO2 retention.

## 2019-09-23 ENCOUNTER — Inpatient Hospital Stay: Payer: Medicare Other

## 2019-09-23 DIAGNOSIS — J9621 Acute and chronic respiratory failure with hypoxia: Secondary | ICD-10-CM

## 2019-09-23 DIAGNOSIS — J9602 Acute respiratory failure with hypercapnia: Secondary | ICD-10-CM

## 2019-09-23 DIAGNOSIS — J9601 Acute respiratory failure with hypoxia: Secondary | ICD-10-CM

## 2019-09-23 DIAGNOSIS — J9622 Acute and chronic respiratory failure with hypercapnia: Principal | ICD-10-CM

## 2019-09-23 DIAGNOSIS — Z7189 Other specified counseling: Secondary | ICD-10-CM

## 2019-09-23 DIAGNOSIS — Z515 Encounter for palliative care: Secondary | ICD-10-CM

## 2019-09-23 LAB — GLUCOSE, CAPILLARY
Glucose-Capillary: 110 mg/dL — ABNORMAL HIGH (ref 70–99)
Glucose-Capillary: 132 mg/dL — ABNORMAL HIGH (ref 70–99)
Glucose-Capillary: 145 mg/dL — ABNORMAL HIGH (ref 70–99)
Glucose-Capillary: 158 mg/dL — ABNORMAL HIGH (ref 70–99)

## 2019-09-23 LAB — VITAMIN B12: Vitamin B-12: 372 pg/mL (ref 180–914)

## 2019-09-23 LAB — BASIC METABOLIC PANEL
Anion gap: 14 (ref 5–15)
BUN: 16 mg/dL (ref 8–23)
CO2: 36 mmol/L — ABNORMAL HIGH (ref 22–32)
Calcium: 9.1 mg/dL (ref 8.9–10.3)
Chloride: 90 mmol/L — ABNORMAL LOW (ref 98–111)
Creatinine, Ser: 0.79 mg/dL (ref 0.44–1.00)
GFR calc Af Amer: >60 mL/min (ref >60)
GFR calc non Af Amer: >60 mL/min (ref >60)
Glucose, Bld: 130 mg/dL — ABNORMAL HIGH (ref 70–99)
Potassium: 4 mmol/L (ref 3.5–5.1)
Sodium: 140 mmol/L (ref 135–145)

## 2019-09-23 LAB — CBC
HCT: 36.8 % (ref 36.0–46.0)
Hemoglobin: 11.3 g/dL — ABNORMAL LOW (ref 12.0–15.0)
MCH: 27.8 pg (ref 26.0–34.0)
MCHC: 30.7 g/dL (ref 30.0–36.0)
MCV: 90.4 fL (ref 80.0–100.0)
Platelets: 188 10*3/uL (ref 150–400)
RBC: 4.07 MIL/uL (ref 3.87–5.11)
RDW: 12.8 % (ref 11.5–15.5)
WBC: 6.8 10*3/uL (ref 4.0–10.5)
nRBC: 0 % (ref 0.0–0.2)

## 2019-09-23 LAB — TSH: TSH: 0.828 u[IU]/mL (ref 0.350–4.500)

## 2019-09-23 LAB — MRSA PCR SCREENING: MRSA by PCR: NEGATIVE

## 2019-09-23 LAB — PREALBUMIN: Prealbumin: 15 mg/dL — ABNORMAL LOW (ref 18–38)

## 2019-09-23 LAB — T4, FREE: Free T4: 0.99 ng/dL (ref 0.61–1.12)

## 2019-09-23 LAB — PROCALCITONIN: Procalcitonin: <0.1 ng/mL

## 2019-09-23 LAB — HEMOGLOBIN A1C
Hgb A1c MFr Bld: 5.7 % — ABNORMAL HIGH (ref 4.8–5.6)
Mean Plasma Glucose: 116.89 mg/dL

## 2019-09-23 LAB — AMMONIA: Ammonia: 20 umol/L (ref 9–35)

## 2019-09-23 MED ORDER — THIAMINE HCL 100 MG/ML IJ SOLN
100.0000 mg | Freq: Once | INTRAMUSCULAR | Status: AC
  Administered 2019-09-23: 100 mg via INTRAVENOUS
  Filled 2019-09-23: qty 2

## 2019-09-23 MED ORDER — B COMPLEX-C PO TABS
1.0000 | ORAL_TABLET | Freq: Every day | ORAL | Status: DC
  Administered 2019-09-24 – 2019-09-27 (×4): 1 via ORAL
  Filled 2019-09-23 (×4): qty 1

## 2019-09-23 MED ORDER — DOXYCYCLINE HYCLATE 100 MG PO TABS
100.0000 mg | ORAL_TABLET | Freq: Two times a day (BID) | ORAL | Status: DC
  Administered 2019-09-23 – 2019-09-27 (×9): 100 mg via ORAL
  Filled 2019-09-23 (×9): qty 1

## 2019-09-23 MED ORDER — CHLORHEXIDINE GLUCONATE CLOTH 2 % EX PADS
6.0000 | MEDICATED_PAD | Freq: Every day | CUTANEOUS | Status: DC
  Administered 2019-09-24 – 2019-09-25 (×2): 6 via TOPICAL

## 2019-09-23 MED ORDER — ALPRAZOLAM 0.5 MG PO TABS
1.0000 mg | ORAL_TABLET | Freq: Three times a day (TID) | ORAL | Status: DC | PRN
  Administered 2019-09-23 – 2019-09-26 (×5): 1 mg via ORAL
  Filled 2019-09-23 (×5): qty 2

## 2019-09-23 MED ORDER — ENSURE ENLIVE PO LIQD
237.0000 mL | Freq: Two times a day (BID) | ORAL | Status: DC
  Administered 2019-09-24 – 2019-09-27 (×6): 237 mL via ORAL

## 2019-09-23 MED ORDER — ORAL CARE MOUTH RINSE
15.0000 mL | Freq: Two times a day (BID) | OROMUCOSAL | Status: DC
  Administered 2019-09-23 – 2019-09-27 (×8): 15 mL via OROMUCOSAL

## 2019-09-23 MED ORDER — CYANOCOBALAMIN 1000 MCG/ML IJ SOLN
1000.0000 ug | Freq: Once | INTRAMUSCULAR | Status: AC
  Administered 2019-09-23: 1000 ug via SUBCUTANEOUS
  Filled 2019-09-23 (×2): qty 1

## 2019-09-23 MED ORDER — PREDNISONE 20 MG PO TABS
20.0000 mg | ORAL_TABLET | Freq: Every day | ORAL | Status: DC
  Administered 2019-09-24 – 2019-09-27 (×4): 20 mg via ORAL
  Filled 2019-09-23 (×4): qty 1

## 2019-09-23 NOTE — Addendum Note (Signed)
Addended by: Tyler Pita on: 09/23/2019 09:05 AM   Modules accepted: Level of Service

## 2019-09-23 NOTE — Progress Notes (Addendum)
[facility] Progress Note  Patient: Tami Skinner HWE:[id number]   PCP: Lorre Munroe, NP DOB: 22-Mar-1946   DOA: 09/22/2019   DOS: 09/23/2019   Date of Service: the patient was seen and examined on 09/23/2019  Chief Complaint  Patient presents with  . Fatigue  . Shortness of Breath     Brief hospital course: Pt. with PMH of COPD, HTN, HLD, OSA, type II DM; presented with complain of cough and shortness of breath, was found to have COPD exacerbation.  Currently further plan is continue current care.  Transfer to telemetry..  Subjective: Continues to have cough and shortness of breath.  No nausea no vomiting.  No chest pain abdominal pain.  No dizziness or lightheadedness.  Assessment and Plan: Scheduled Meds: . budesonide (PULMICORT) nebulizer solution  0.5 mg Nebulization BID  . busPIRone  10 mg Oral TID  . Chlorhexidine Gluconate Cloth  6 each Topical Daily  . cholecalciferol  2,000 Units Oral Daily  . doxycycline  100 mg Oral Q12H  . enoxaparin (LOVENOX) injection  40 mg Subcutaneous Q24H  . feeding supplement (ENSURE ENLIVE)  237 mL Oral BID BM  . guaiFENesin  600 mg Oral BID  . insulin aspart  0-15 Units Subcutaneous TID WC  . ipratropium-albuterol  3 mL Nebulization Q4H  . mouth rinse  15 mL Mouth Rinse BID  . metoprolol succinate  50 mg Oral Daily  . oxybutynin  5 mg Oral QHS  . pantoprazole  40 mg Oral Daily  . [START ON 09/24/2019] predniSONE  20 mg Oral Q breakfast  . rosuvastatin  5 mg Oral Daily  . sertraline  200 mg Oral BID   Continuous Infusions: PRN Meds: acetaminophen **OR** acetaminophen, ALPRAZolam, chlorpheniramine-HYDROcodone, furosemide, ipratropium-albuterol, labetalol, magnesium hydroxide, ondansetron **OR** ondansetron (ZOFRAN) IV, traZODone  1.  Acute hypoxic and hypercarbic respiratory failure secondary to severe COPD exacerbation.  Chest x-ray unremarkable. Tachypneic, tachycardic on admission. Required BiPAP in the emergency department  as She progressed. SARS Covid test negative. Continue IV steroids. Continue duo nebs. Transition to doxycycline. Monitor cultures. We will discontinue BiPAP and transfer to MedSurg. Appreciate CCM input.  2.  Acute bronchitis, likely the culprit for her COPD exacerbation.  Management as above.  3.  Hypertensive urgency.  We will continue Toprol-XL and place the patient on as needed IV labetalol.  4.  Type 2 diabetes mellitus.  The patient will be placed on supplement coverage with NovoLog.  5.  Dyslipidemia.  We will continue statin therapy.  6.  GERD.  PPI therapy will be resumed especially given current steroid therapy.  7.  Acute metabolic encephalopathy. Likely in the setting of hypoxia. CT scan of the head unremarkable. Metabolic work-up so far also unremarkable. Continue to monitor closely. We will provide some B1 and B12.  Diet: Cardiac diet and Carb modified diet  DVT Prophylaxis: Subcutaneous Heparin    Advance goals of care discussion: DNR  Family Communication: no family was present at bedside, at the time of interview.   Disposition:  Discharge to be determined.  Likely SNF  Consultants: PCCM Palliative care  Procedures: none  Antibiotics: Anti-infectives (From admission, onward)   Start     Dose/Rate Route Frequency Ordered Stop   09/23/19 1000  doxycycline (VIBRA-TABS) tablet 100 mg     100 mg Oral Every 12 hours 09/23/19 0755     09/23/19 0000  cephALEXin (KEFLEX) capsule 500 mg  Status:  Discontinued     500 mg Oral Every 6  hours 09/22/19 2226 09/23/19 0755   09/22/19 2200  cefTRIAXone (ROCEPHIN) 1 g in sodium chloride 0.9 % 100 mL IVPB  Status:  Discontinued     1 g 200 mL/hr over 30 Minutes Intravenous Every 24 hours 09/22/19 2010 09/22/19 2201   09/22/19 2200  azithromycin (ZITHROMAX) 500 mg in sodium chloride 0.9 % 250 mL IVPB  Status:  Discontinued     500 mg 250 mL/hr over 60 Minutes Intravenous Every 24 hours 09/22/19 2010 09/22/19 2231      Objective: Physical Exam: Vitals:   09/23/19 1100 09/23/19 1116 09/23/19 1234 09/23/19 1700  BP: 129/77   117/86  Pulse: 92     Resp: (!) 29   20  Temp:   98.1 F (36.7 C) 98.5 F (36.9 C)  TempSrc:   Axillary Oral  SpO2: 96% 95%  94%  Weight:      Height:        Intake/Output Summary (Last 24 hours) at 09/23/2019 1839 Last data filed at 09/23/2019 1300 Gross per 24 hour  Intake 834.1 ml  Output 25 ml  Net 809.1 ml   Filed Weights   09/22/19 1424 09/22/19 2100  Weight: 70.8 kg 69.4 kg   General: alert and oriented to time, place, and person. Appear in moderate distress, affect appropriate Eyes: PERRL, Conjunctiva normal ENT: Oral Mucosa Clear, moist  Neck: no JVD, no Abnormal Mass Or lumps Cardiovascular: S1 and S2 Present, no Murmur,  Respiratory: good respiratory effort, Bilateral Air entry equal and Decreased, no signs of accessory muscle use, no Crackles, Occasional  wheezes Abdomen: Bowel Sound present, Soft and no tenderness, no hernia Skin: no rashes  Extremities: no Pedal edema, no calf tenderness Neurologic: without any new focal findings Gait not checked due to patient safety concerns  Data Reviewed: I have personally reviewed and interpreted daily labs, tele strips, imagings as discussed above. I reviewed all nursing notes, pharmacy notes, vitals, pertinent old records I have discussed plan of care as described above with RN and patient/family.  CBC: Recent Labs  Lab 09/22/19 1432 09/23/19 0430  WBC 7.8 6.8  HGB 14.5 11.3*  HCT 47.5* 36.8  MCV 95.4 90.4  PLT 257 188   Basic Metabolic Panel: Recent Labs  Lab 09/22/19 1432 09/23/19 0430  NA 140 140  K 4.1 4.0  CL 90* 90*  CO2 37* 36*  GLUCOSE 139* 130*  BUN 20 16  CREATININE 0.77 0.79  CALCIUM 10.2 9.1    Liver Function Tests: Recent Labs  Lab 09/22/19 1432  AST 26  ALT 23  ALKPHOS 68  BILITOT 1.0  PROT 8.4*  ALBUMIN 5.0   No results for input(s): LIPASE, AMYLASE in the  last 168 hours. Recent Labs  Lab 09/23/19 0856  AMMONIA 20   Coagulation Profile: No results for input(s): INR, PROTIME in the last 168 hours. Cardiac Enzymes: No results for input(s): CKTOTAL, CKMB, CKMBINDEX, TROPONINI in the last 168 hours. BNP (last 3 results) No results for input(s): PROBNP in the last 8760 hours. CBG: Recent Labs  Lab 09/22/19 2103 09/23/19 0748 09/23/19 1134 09/23/19 1617  GLUCAP 109* 110* 158* 132*   Studies: Ct Head Wo Contrast  Result Date: 09/23/2019 CLINICAL DATA:  Altered level of consciousness. EXAM: CT HEAD WITHOUT CONTRAST TECHNIQUE: Contiguous axial images were obtained from the base of the skull through the vertex without intravenous contrast. COMPARISON:  None. FINDINGS: Brain: No evidence of acute infarction, hemorrhage, hydrocephalus, extra-axial collection or mass lesion/mass effect. Vascular: No  hyperdense vessel or unexpected calcification. Skull: Normal. Negative for fracture or focal lesion. Sinuses/Orbits: No acute finding. Other: None. IMPRESSION: Normal head CT. Electronically Signed   By: Marijo Conception M.D.   On: 09/23/2019 11:28   US Venous Img Lower Bilateral (dvt)  Result Date: 09/23/2019 CLINICAL DATA:  73 year old female with erythema/swelling EXAM: BILATERAL LOWER EXTREMITY VENOUS DOPPLER ULTRASOUND TECHNIQUE: Gray-scale sonography with graded compression, as well as color Doppler and duplex ultrasound were performed to evaluate the lower extremity deep venous systems from the level of the common femoral vein and including the common femoral, femoral, profunda femoral, popliteal and calf veins including the posterior tibial, peroneal and gastrocnemius veins when visible. The superficial great saphenous vein was also interrogated. Spectral Doppler was utilized to evaluate flow at rest and with distal augmentation maneuvers in the common femoral, femoral and popliteal veins. COMPARISON:  None. FINDINGS: RIGHT LOWER EXTREMITY Common  Femoral Vein: No evidence of thrombus. Normal compressibility, respiratory phasicity and response to augmentation. Saphenofemoral Junction: No evidence of thrombus. Normal compressibility and flow on color Doppler imaging. Profunda Femoral Vein: No evidence of thrombus. Normal compressibility and flow on color Doppler imaging. Femoral Vein: No evidence of thrombus. Normal compressibility, respiratory phasicity and response to augmentation. Popliteal Vein: No evidence of thrombus. Normal compressibility, respiratory phasicity and response to augmentation. Calf Veins: No evidence of thrombus. Normal compressibility and flow on color Doppler imaging. Superficial Great Saphenous Vein: No evidence of thrombus. Normal compressibility and flow on color Doppler imaging. Other Findings:  None. LEFT LOWER EXTREMITY Common Femoral Vein: No evidence of thrombus. Normal compressibility, respiratory phasicity and response to augmentation. Saphenofemoral Junction: No evidence of thrombus. Normal compressibility and flow on color Doppler imaging. Profunda Femoral Vein: No evidence of thrombus. Normal compressibility and flow on color Doppler imaging. Femoral Vein: No evidence of thrombus. Normal compressibility, respiratory phasicity and response to augmentation. Popliteal Vein: No evidence of thrombus. Normal compressibility, respiratory phasicity and response to augmentation. Calf Veins: No evidence of thrombus. Normal compressibility and flow on color Doppler imaging. Superficial Great Saphenous Vein: No evidence of thrombus. Normal compressibility and flow on color Doppler imaging. Other Findings:  None. IMPRESSION: Sonographic survey of the bilateral lower extremities negative for DVT Electronically Signed   By: Corrie Mckusick D.O.   On: 09/23/2019 07:52     Time spent: 35 minutes  Author: Berle Mull, MD [facility] 09/23/2019 6:39 PM  To reach On-call, see care teams to locate the attending and reach out to  them via [email or web address]. If 7PM-7AM, please contact night-coverage If you still have difficulty reaching the attending provider, please page the [facility] (Director on Call) for [facility] on amion for assistance.

## 2019-09-23 NOTE — Consult Note (Addendum)
Consultation Note Date: 09/23/2019   Patient Name: Tami Skinner  DOB: 01/14/1946  MRN: [id number]  Age / Sex: 73 y.o., female  PCP: Lorre MunroeBaity, Regina W, NP Referring Physician: Rolly SalterPatel, Pranav M, MD  Reason for Consultation: Establishing goals of care  HPI/Patient Profile: Tami Skinner  is a 73 y.o. Caucasian female with a known history of COPD, type II diabetes mellitus, hypertension, dyslipidemia and obstructive sleep apnea, who presented to the emergency room with acute onset of altered mental status with worsening somnolence, lethargy and generalized weakness who was sent from pulmonary clinic for hypercarbia.   Clinical Assessment and Goals of Care: Patient is resting in bed on O2 by cannula. She is off BIPAP. She lives at home with her husband. She states she has 1 living child.   Functionally, she has used a walker for the past year. She does not drive as she states she does not have the nerve to due to traffic. She has been on home  O2 for about the last 2 years, and on 3 lpm of O2 for the past 6 months. Her appetite has been decreased for the past few weeks.    We discussed her diagnosis, prognosis, GOC, EOL wishes disposition and options.  A detailed discussion was had today regarding advanced directives.  Concepts specific to code status, artifical feeding and hydration, IV antibiotics and rehospitalization were discussed.  The difference between an aggressive medical intervention path and a comfort care path was discussed.  Values and goals of care important to patient and family were attempted to be elicited.  Discussed limitations of medical interventions to prolong quality of life in some situations and discussed the concept of human mortality.  She states she has a living will and HPOA papers. She states she hates talking about this "stuff", and states "I've been asked about it a 100 times", but  states her husband and children know what to do.  She becomes confused stating different answers to the same questions, but states she would not want a ventilator as she does not want the kids to have to make the decision to withdraw it. She would not want a feeding tube. She becomes confused stating different answers about CPR and begins talking about other things off the subject of healthcare.   Spoke with her husband. He is unsure about her wishes and is currently looking for her HPOA and living will paperwork.   SUMMARY OF RECOMMENDATIONS    Husband is looking for HPOA papers and her living will. He is unsure of her wishes. He is aware that currently she has a DNR status.   Prognosis:   Poor overall.       Primary Diagnoses: Present on Admission: . Acute respiratory failure with hypoxia and hypercapnia (HCC)   I have reviewed the medical record, interviewed the patient and family, and examined the patient. The following aspects are pertinent.  Past Medical History:  Diagnosis Date  . Anxiety   . Asthmatic bronchitis   . COPD (chronic obstructive  pulmonary disease) (HCC)   . Depression   . Diabetes mellitus   . Diverticulitis   . Diverticulosis   . Hyperlipidemia   . Hypertension   . Osteopenia   . Sleep apnea   . Stress-induced cardiomyopathy    a. echo 09/28/2014: EF 45-50%, severe HK of mid-distal anterior, apical and mid-distal inferior walls suggestive of stress induced CM, mildly dilated LA, mildly dilated PASP, mild TR, trivial pericardial effusion   Social History   Socioeconomic History  . Marital status: Married    Spouse name: Not on file  . Number of children: Not on file  . Years of education: Not on file  . Highest education level: Not on file  Occupational History  . Not on file  Social Needs  . Financial resource strain: Not on file  . Food insecurity    Worry: Not on file    Inability: Not on file  . Transportation needs    Medical: Not on  file    Non-medical: Not on file  Tobacco Use  . Smoking status: Former Smoker    Packs/day: 0.25    Years: 40.00    Pack years: 10.00    Types: Cigarettes    Quit date: 09/01/2016    Years since quitting: 3.0  . Smokeless tobacco: Never Used  . Tobacco comment: Uses nicotrol inhaler on occasion  Substance and Sexual Activity  . Alcohol use: No  . Drug use: No  . Sexual activity: Not Currently  Lifestyle  . Physical activity    Days per week: Not on file    Minutes per session: Not on file  . Stress: Not on file  Relationships  . Social Musician on phone: Not on file    Gets together: Not on file    Attends religious service: Not on file    Active member of club or organization: Not on file    Attends meetings of clubs or organizations: Not on file    Relationship status: Not on file  Other Topics Concern  . Not on file  Social History Narrative   Lives with husband in [place]. 2 dogs. Works at [employer].   Family History  Problem Relation Age of Onset  . Diabetes Daughter   . Kidney disease Daughter   . Lung cancer Mother        smoked  . COPD Mother        smoked  . COPD Father        smoked  . Prostate cancer Father   . Kidney failure Father   . Breast cancer Maternal Aunt   . Diabetes Maternal Grandmother   . Breast cancer Maternal Grandmother   . Cancer Cousin        colon   Scheduled Meds: . budesonide (PULMICORT) nebulizer solution  0.5 mg Nebulization BID  . busPIRone  10 mg Oral TID  . Chlorhexidine Gluconate Cloth  6 each Topical Daily  . cholecalciferol  2,000 Units Oral Daily  . doxycycline  100 mg Oral Q12H  . enoxaparin (LOVENOX) injection  40 mg Subcutaneous Q24H  . feeding supplement (ENSURE ENLIVE)  237 mL Oral BID BM  . guaiFENesin  600 mg Oral BID  . insulin aspart  0-15 Units Subcutaneous TID WC  . ipratropium-albuterol  3 mL Nebulization Q4H  . mouth rinse  15 mL Mouth Rinse BID  . metoprolol succinate  50 mg Oral Daily  .  oxybutynin  5 mg Oral  QHS  . pantoprazole  40 mg Oral Daily  . [START ON 09/24/2019] predniSONE  20 mg Oral Q breakfast  . rosuvastatin  5 mg Oral Daily  . sertraline  200 mg Oral BID   Continuous Infusions: PRN Meds:.acetaminophen **OR** acetaminophen, ALPRAZolam, chlorpheniramine-HYDROcodone, furosemide, ipratropium-albuterol, labetalol, magnesium hydroxide, ondansetron **OR** ondansetron (ZOFRAN) IV, traZODone Medications Prior to Admission:  Prior to Admission medications   Medication Sig Start Date End Date Taking? Authorizing Provider  albuterol (PROVENTIL HFA;VENTOLIN HFA) 108 (90 Base) MCG/ACT inhaler INHALE 2 PUFFS INTO THE LUNGS EVERY 4 (FOUR) HOURS AS NEEDED FOR WHEEZING OR SHORTNESS OF BREATH. 11/22/18  Yes Wilhelmina Mcardle, MD  albuterol (PROVENTIL) (2.5 MG/3ML) 0.083% nebulizer solution Take 3 mLs (2.5 mg total) by nebulization every 6 (six) hours as needed for wheezing or shortness of breath. J44.9 11/22/18  Yes Wilhelmina Mcardle, MD  ALPRAZolam Duanne Moron) 0.5 MG tablet Take 1 tablet (0.5 mg total) by mouth 3 (three) times daily as needed for anxiety. 09/19/19  Yes Jearld Fenton, NP  buPROPion (WELLBUTRIN XL) 150 MG 24 hr tablet Take 150 mg by mouth daily.   Yes [provider]  busPIRone (BUSPAR) 10 MG tablet Take 1 tablet (10 mg total) by mouth 3 (three) times daily. 08/15/19  Yes Jearld Fenton, NP  Cholecalciferol (VITAMIN D-3) 1000 UNITS CAPS Take 2,000 Units by mouth daily.    Yes [provider]  conjugated estrogens (PREMARIN) vaginal cream Apply 0.5g (pea-sized amount) just inside the vaginal introitus with a finger-tip on Monday,Wednesday and Friday night 06/07/19  Yes Baity, Coralie Keens, NP  furosemide (LASIX) 20 MG tablet Take 1 tablet (20 mg total) by mouth as needed for edema. 10/13/18  Yes Wellington Hampshire, MD  metoprolol succinate (TOPROL-XL) 50 MG 24 hr tablet TAKE 1 TABLET (50 MG TOTAL) BY MOUTH DAILY. TAKE WITH OR IMMEDIATELY FOLLOWING A MEAL. 07/07/19  10/05/19 Yes Wellington Hampshire, MD  nystatin (MYCOSTATIN/NYSTOP) powder Apply topically 3 (three) times daily. 07/21/19  Yes Jearld Fenton, NP  omeprazole (PRILOSEC) 40 MG capsule Take 1 capsule (40 mg total) by mouth 2 (two) times daily. 07/21/19  Yes Baity, Coralie Keens, NP  ondansetron (ZOFRAN ODT) 8 MG disintegrating tablet Take 1 tablet (8 mg total) by mouth every 8 (eight) hours as needed for nausea or vomiting. 08/19/19  Yes Baity, Coralie Keens, NP  ONE TOUCH ULTRA TEST test strip 1 EACH BY OTHER ROUTE DAILY AS NEEDED FOR OTHER. 09/01/18  Yes Baity, Coralie Keens, NP  oxybutynin (DITROPAN-XL) 5 MG 24 hr tablet TAKE 1 TABLET BY MOUTH EVERYDAY AT BEDTIME Patient taking differently: Take 5 mg by mouth at bedtime.  08/29/19  Yes Baity, Coralie Keens, NP  rosuvastatin (CRESTOR) 5 MG tablet TAKE 1 TABLET BY MOUTH EVERY DAY Patient taking differently: Take 5 mg by mouth daily.  09/07/19  Yes Jearld Fenton, NP  tiotropium (SPIRIVA HANDIHALER) 18 MCG inhalation capsule Place 1 capsule (18 mcg total) into inhaler and inhale daily. 10/22/18  Yes Wilhelmina Mcardle, MD  WIXELA INHUB 250-50 MCG/DOSE AEPB 1 puff 2 (two) times daily. 08/07/19  Yes [provider]  azithromycin (ZITHROMAX Z-PAK) 250 MG tablet Take 2 tablets on Day 1 and then 1 tablet daily till gone. Patient not taking: Reported on 09/22/2019 08/18/19   Flora Lipps, MD  budesonide (PULMICORT) 0.25 MG/2ML nebulizer solution Take 2 mLs (0.25 mg total) by nebulization 2 (two) times daily. Patient not taking: Reported on 09/22/2019 07/05/19 07/04/20  Alva Garnet,  Oley Balm, MD  sertraline (ZOLOFT) 100 MG tablet TAKE 2 TABLETS BY MOUTH TWICE A DAY 07/27/19   Lorre Munroe, NP   Allergies  Allergen Reactions  . Amoxicillin-Pot Clavulanate     REACTION: stomach sensitive.  Lenice Llamas [Roflumilast]     Severe nausea   . Erythromycin Base     REACTION: GI upset  . Neurontin [Gabapentin]     anxiety   Review of Systems  All other systems reviewed and are  negative.   Physical Exam Pulmonary:     Effort: Pulmonary effort is normal.  Skin:    General: Skin is warm and dry.  Neurological:     Mental Status: She is alert.     Vital Signs: BP 129/77   Pulse 92   Temp 98.1 F (36.7 C) (Axillary)   Resp (!) 29   Ht  (1.626 m)   Wt 69.4 kg   SpO2 95%   BMI 26.26 kg/m  Pain Scale: 0-10   Pain Score: 0-No pain   SpO2: SpO2: 95 % O2 Device:SpO2: 95 % O2 Flow Rate: .O2 Flow Rate (L/min): 4 L/min  IO: Intake/output summary:   Intake/Output Summary (Last 24 hours) at 09/23/2019 1547 Last data filed at 09/23/2019 1300 Gross per 24 hour  Intake 834.1 ml  Output 25 ml  Net 809.1 ml    LBM: Last BM Date: 09/22/19 Baseline Weight: Weight: 70.8 kg Most recent weight: Weight: 69.4 kg     Palliative Assessment/Data:     Time In: 3:35 Time Out: 4:10 Time Total:35 min Greater than 50%  of this time was spent counseling and coordinating care related to the above assessment and plan.  Signed by: Morton Stall, NP   Please contact Palliative Medicine Team phone at [phone] for questions and concerns.  For individual provider: See Loretha Stapler

## 2019-09-23 NOTE — Evaluation (Signed)
Physical Therapy Evaluation Patient Details Name: Tami Skinner MRN: [id number] DOB: [birth year] Today's Date: 09/23/2019   History of Present Illness  73 y.o. Caucasian female with a known history of COPD, type II diabetes mellitus, hypertension, dyslipidemia and obstructive sleep apnea, who presented to the emergency room with acute onset of altered mental status with worsening somnolence, lethargy and generalized weakness who was sent from pulmonary clinic for hypercarbia.   Clinical Impression  Pt pleasantly confused t/o the PT exam.  She was able to follow simple instructions but showed some general impulsivity and needed extra cuing.  She was able to relatively safely get to sitting without physical assist but tolerated very little ambulation and after just a few feet of ambulation requested to sit reporting being very tired.  Pt's O2 remained in the 90s, however she was on 4L t/o the session.    Follow Up Recommendations SNF    Equipment Recommendations  (TBD at next venue of care)    Recommendations for Other Services       Precautions / Restrictions Precautions Precautions: Fall Restrictions Weight Bearing Restrictions: No      Mobility  Bed Mobility Overal bed mobility: Needs Assistance Bed Mobility: Supine to Sit     Supine to sit: Min assist     General bed mobility comments: Pt needed light assist to get to sitting EOB, able to maintain balance (very stooped/kyphotic posture)  Transfers Overall transfer level: Needs assistance Equipment used: Rolling walker (2 wheeled) Transfers: Sit to/from Stand Sit to Stand: Min assist         General transfer comment: Pt is able to rise with light assist to keep weight forward and insure appropriate use of UEs (on bed and walker)  Ambulation/Gait Ambulation/Gait assistance: Min assist Gait Distance (Feet): 8 Feet Assistive device: Rolling walker (2 wheeled)       General Gait Details: Pt was very quick to  fatigue with modest ambulation effort.  She was on 4L O2 t/o the effort with sats staying in the 90s (dropping to low 90s with activity).  Pt c/o considerable fatigue and shortness of breath (though she did not show observable DOE).  PT encouraged her to try to walk further however she reported that she could go any further and needed to sit.  Stairs            Wheelchair Mobility    Modified Rankin (Stroke Patients Only)       Balance Overall balance assessment: Needs assistance Sitting-balance support: Bilateral upper extremity supported Sitting balance-Leahy Scale: Fair Sitting balance - Comments: Pt was able to maintain sitting balance w/o assist at EOB, however she displayed poor posture and struggled to improve this with cuing     Standing balance-Leahy Scale: Fair Standing balance comment: Pt was reliant on the walker, showed no overt LOBs but was cautious and had poor tolerance to standing                             Pertinent Vitals/Pain Pain Assessment: No/denies pain    Home Living Family/patient expects to be discharged to:: Unsure                 Additional Comments: Pt confused, could not answer most questions regarding living situation, etc    Prior Function                 Hand Dominance  Extremity/Trunk Assessment   Upper Extremity Assessment Upper Extremity Assessment: Generalized weakness(able to elevate shoulders >90 but not fully, strength 3+/5 )    Lower Extremity Assessment Lower Extremity Assessment: Generalized weakness(follows some strength testing, inconsistent, grossly 3/5)       Communication   Communication: No difficulties  Cognition   Behavior During Therapy: WFL for tasks assessed/performed Overall Cognitive Status: Difficult to assess                                 General Comments: unsure of baseline status, pt was able to states name and birthday and knew that it was her birth  month but otherwise was pleasantly confused      General Comments      Exercises     Assessment/Plan    PT Assessment Patient needs continued PT services  PT Problem List Decreased activity tolerance;Decreased range of motion;Decreased strength;Decreased balance;Decreased coordination;Decreased mobility;Decreased knowledge of use of DME;Decreased cognition;Decreased safety awareness;Cardiopulmonary status limiting activity       PT Treatment Interventions DME instruction;Gait training;Functional mobility training;Therapeutic activities;Therapeutic exercise;Balance training;Neuromuscular re-education;Cognitive remediation;Patient/family education    PT Goals (Current goals can be found in the Care Plan section)  Acute Rehab PT Goals Patient Stated Goal: Pt unable to state PT Goal Formulation: With patient Time For Goal Achievement: 10/07/19 Potential to Achieve Goals: Fair    Frequency Min 2X/week   Barriers to discharge        Co-evaluation               AM-PAC PT "6 Clicks" Mobility  Outcome Measure Help needed turning from your back to your side while in a flat bed without using bedrails?: A Little Help needed moving from lying on your back to sitting on the side of a flat bed without using bedrails?: A Little Help needed moving to and from a bed to a chair (including a wheelchair)?: A Lot Help needed standing up from a chair using your arms (e.g., wheelchair or bedside chair)?: A Lot Help needed to walk in hospital room?: A Lot Help needed climbing 3-5 steps with a railing? : A Lot 6 Click Score: 14    End of Session Equipment Utilized During Treatment: Gait belt Activity Tolerance: Patient limited by fatigue Patient left: in chair;with call bell/phone within reach Nurse Communication: Mobility status PT Visit Diagnosis: Muscle weakness (generalized) (M62.81);Difficulty in walking, not elsewhere classified (R26.2)    Time: 0272-5366 PT Time Calculation  (min) (ACUTE ONLY): 28 min   Charges:   PT Evaluation $PT Eval Low Complexity: 1 Low PT Treatments $Therapeutic Activity: 8-22 mins        Malachi Pro, DPT 09/23/2019, 4:27 PM

## 2019-09-23 NOTE — Plan of Care (Signed)
Pt tolerating Bipap well.  RR improving.

## 2019-09-23 NOTE — Progress Notes (Addendum)
Initial Nutrition Assessment  DOCUMENTATION CODES:   Not applicable  INTERVENTION:   Ensure Enlive po BID, each supplement provides 350 kcal and 20 grams of protein  NUTRITION DIAGNOSIS:   Increased nutrient needs related to catabolic illness(COPD) as evidenced by increased estimated needs.  GOAL:   Patient will meet greater than or equal to 90% of their needs  MONITOR:   PO intake, Supplement acceptance, Labs, Weight trends, Skin, I & O's  REASON FOR ASSESSMENT:   Consult Assessment of nutrition requirement/status  ASSESSMENT:   73 yo female with a PMH of Stress-Induced Cardiomyopathy (Echo 07/2017 EF 60% to 65% with grade 1 diastolic dysfunction), Stable Right Lower Lobe Pulmonary Nodule (CT Chest 09/2014), Former Smoker (quit Oct 2017), OSA (CPAP Intolerant), Osteopenia, HTN, Hyperlipidemia, Diverticulitis, Diverticulosis, Type II Diabetes Mellitus, Depression, COPD, Asthmatic Bronchitis, and Anxiety admitted with COPD exacerbation  Pt currently on Bipap. Pt documented to have eaten 100% of her lunch today which included a side salad, baked tilapia, mac & cheese, fruit cup and a side of broccoli. RD will add supplements to help pt meet her estimated needs. Will also liberalize the heart healthy restriction from pt's diet as this is restrictive of protein. Will add 3g sodium restriction via health touch. Per chart, pt appears weight stable pta. Suspect good appetite and oral intake at baseline. DM appears to be controlled.   Medications reviewed and include: D3, doxycycline, lovenox, insulin, protonix, prednisone   Labs reviewed: AIC 5.7(H)- 11/19  NUTRITION - FOCUSED PHYSICAL EXAM:    Most Recent Value  Orbital Region  No depletion  Upper Arm Region  No depletion  Thoracic and Lumbar Region  No depletion  Buccal Region  No depletion  Temple Region  No depletion  Clavicle Bone Region  No depletion  Clavicle and Acromion Bone Region  No depletion  Scapular Bone Region   No depletion  Dorsal Hand  No depletion  Patellar Region  No depletion  Anterior Thigh Region  No depletion  Posterior Calf Region  No depletion  Edema (RD Assessment)  None  Hair  Reviewed  Eyes  Reviewed  Mouth  Reviewed  Skin  Reviewed  Nails  Reviewed     Diet Order:   Diet Order            Diet heart healthy/carb modified Room service appropriate? Yes; Fluid consistency: Thin  Diet effective now             EDUCATION NEEDS:   Education needs have been addressed  Skin:  Skin Assessment: Reviewed RN Assessment(LE Cellulitis)  Last BM:  11/19  Height:   Ht Readings from Last 1 Encounters:  09/22/19 _0  (1.626 m)    Weight:   Wt Readings from Last 1 Encounters:  09/22/19 69.4 kg    Ideal Body Weight:  54.5 kg  BMI:  Body mass index is 26.26 kg/m.  Estimated Nutritional Needs:   Kcal:  1600-1800kcal/day  Protein:  80-90g/day  Fluid:  >1.4L/day  Koleen Distance MS, RD, LDN Pager #- [phone] Office#- [phone] After Hours Pager: [phone]

## 2019-09-24 LAB — PROCALCITONIN: Procalcitonin: <0.1 ng/mL

## 2019-09-24 LAB — GLUCOSE, CAPILLARY
Glucose-Capillary: 93 mg/dL (ref 70–99)
Glucose-Capillary: 147 mg/dL — ABNORMAL HIGH (ref 70–99)
Glucose-Capillary: 144 mg/dL — ABNORMAL HIGH (ref 70–99)
Glucose-Capillary: 115 mg/dL — ABNORMAL HIGH (ref 70–99)

## 2019-09-24 MED ORDER — INFLUENZA VAC A&B SA ADJ QUAD 0.5 ML IM PRSY: 0.5000 mL | PREFILLED_SYRINGE | INTRAMUSCULAR | Status: DC

## 2019-09-24 NOTE — Progress Notes (Signed)
[facility] Progress Note  Patient: Tami Skinner IRS:[id number]   PCP: Lorre Munroe, NP DOB: 04/18/1946   DOA: 09/22/2019   DOS: 09/24/2019   Date of Service: the patient was seen and examined on 09/24/2019  Chief Complaint  Patient presents with  . Fatigue  . Shortness of Breath     Brief hospital course: Pt. with PMH of COPD, HTN, HLD, OSA, type II DM; presented with complain of cough and shortness of breath, was found to have COPD exacerbation.  Currently further plan is continue current care.    Subjective: Improving cough and shortness of breath. No nausea no vomiting. No chest pain. Still not back to baseline.  Assessment and Plan: Scheduled Meds: . B-complex with vitamin C  1 tablet Oral Daily  . budesonide (PULMICORT) nebulizer solution  0.5 mg Nebulization BID  . busPIRone  10 mg Oral TID  . Chlorhexidine Gluconate Cloth  6 each Topical Daily  . cholecalciferol  2,000 Units Oral Daily  . doxycycline  100 mg Oral Q12H  . enoxaparin (LOVENOX) injection  40 mg Subcutaneous Q24H  . feeding supplement (ENSURE ENLIVE)  237 mL Oral BID BM  . guaiFENesin  600 mg Oral BID  . insulin aspart  0-15 Units Subcutaneous TID WC  . ipratropium-albuterol  3 mL Nebulization Q4H  . mouth rinse  15 mL Mouth Rinse BID  . metoprolol succinate  50 mg Oral Daily  . oxybutynin  5 mg Oral QHS  . pantoprazole  40 mg Oral Daily  . predniSONE  20 mg Oral Q breakfast  . rosuvastatin  5 mg Oral Daily  . sertraline  200 mg Oral BID   Continuous Infusions: PRN Meds: acetaminophen **OR** acetaminophen, ALPRAZolam, ALPRAZolam, chlorpheniramine-HYDROcodone, furosemide, ipratropium-albuterol, labetalol, magnesium hydroxide, ondansetron **OR** ondansetron (ZOFRAN) IV, traZODone  1.  Acute hypoxic and hypercarbic respiratory failure secondary to severe COPD exacerbation.  Chest x-ray unremarkable. Tachypneic, tachycardic on admission. Required BiPAP in the emergency department as She  progressed. SARS Covid test negative. Continue oral steroids. Continue duo nebs. Transition to doxycycline. Monitor cultures. Off of BiPAP. Appreciate CCM input.  2.  Acute bronchitis, likely the culprit for her COPD exacerbation.  Management as above.  3.  Hypertensive urgency.  We will continue Toprol-XL and place the patient on as needed IV labetalol.  4.  Type 2 diabetes mellitus.  The patient will be placed on supplement coverage with NovoLog.  5.  Dyslipidemia.  We will continue statin therapy.  6.  GERD.  PPI therapy will be resumed especially given current steroid therapy.  7.  Acute metabolic encephalopathy. Likely in the setting of hypoxia. CT scan of the head unremarkable. Metabolic work-up so far also unremarkable. Continue to monitor closely. We will provide some B1 and B12.  Diet: Cardiac diet and Carb modified diet  DVT Prophylaxis: Subcutaneous Heparin    Advance goals of care discussion: DNR  Family Communication: no family was present at bedside, at the time of interview.   Disposition:  Discharge SNF  Consultants: PCCM Palliative care  Procedures: none  Antibiotics: Anti-infectives (From admission, onward)   Start     Dose/Rate Route Frequency Ordered Stop   09/23/19 1000  doxycycline (VIBRA-TABS) tablet 100 mg     100 mg Oral Every 12 hours 09/23/19 0755     09/23/19 0000  cephALEXin (KEFLEX) capsule 500 mg  Status:  Discontinued     500 mg Oral Every 6 hours 09/22/19 2226 09/23/19 0755   09/22/19 2200  cefTRIAXone (ROCEPHIN) 1 g in sodium chloride 0.9 % 100 mL IVPB  Status:  Discontinued     1 g 200 mL/hr over 30 Minutes Intravenous Every 24 hours 09/22/19 2010 09/22/19 2201   09/22/19 2200  azithromycin (ZITHROMAX) 500 mg in sodium chloride 0.9 % 250 mL IVPB  Status:  Discontinued     500 mg 250 mL/hr over 60 Minutes Intravenous Every 24 hours 09/22/19 2010 09/22/19 2231     Objective: Physical Exam: Vitals:   09/24/19 0614 09/24/19  0739 09/24/19 0740 09/24/19 1549  BP: (!) 154/77 (!) 152/75 (!) 148/79 (!) 115/99  Pulse: 75 75  71  Resp: 20 (!) 22  20  Temp: 98.5 F (36.9 C) 98.3 F (36.8 C)  98 F (36.7 C)  TempSrc: Oral Oral  Oral  SpO2: 100% 99%  96%  Weight:      Height:        Intake/Output Summary (Last 24 hours) at 09/24/2019 1759 Last data filed at 09/24/2019 1548 Gross per 24 hour  Intake -  Output 1650 ml  Net -1650 ml   Filed Weights   09/22/19 1424 09/22/19 2100 09/24/19 0612  Weight: 70.8 kg 69.4 kg 68.6 kg   General: alert and oriented to time, place, and person. Appear in moderate distress, affect appropriate Eyes: PERRL, Conjunctiva normal ENT: Oral Mucosa Clear, moist  Neck: no JVD, no Abnormal Mass Or lumps Cardiovascular: S1 and S2 Present, no Murmur,  Respiratory: good respiratory effort, Bilateral Air entry equal and Decreased, no signs of accessory muscle use, no Crackles, Occasional  wheezes Abdomen: Bowel Sound present, Soft and no tenderness, no hernia Skin: no rashes  Extremities: no Pedal edema, no calf tenderness Neurologic: without any new focal findings Gait not checked due to patient safety concerns  Data Reviewed: I have personally reviewed and interpreted daily labs, tele strips, imagings as discussed above. I reviewed all nursing notes, pharmacy notes, vitals, pertinent old records I have discussed plan of care as described above with RN and patient/family.  CBC: Recent Labs  Lab 09/22/19 1432 09/23/19 0430  WBC 7.8 6.8  HGB 14.5 11.3*  HCT 47.5* 36.8  MCV 95.4 90.4  PLT 257 322   Basic Metabolic Panel: Recent Labs  Lab 09/22/19 1432 09/23/19 0430  NA 140 140  K 4.1 4.0  CL 90* 90*  CO2 37* 36*  GLUCOSE 139* 130*  BUN 20 16  CREATININE 0.77 0.79  CALCIUM 10.2 9.1    Liver Function Tests: Recent Labs  Lab 09/22/19 1432  AST 26  ALT 23  ALKPHOS 68  BILITOT 1.0  PROT 8.4*  ALBUMIN 5.0   No results for input(s): LIPASE, AMYLASE in the  last 168 hours. Recent Labs  Lab 09/23/19 0856  AMMONIA 20   Coagulation Profile: No results for input(s): INR, PROTIME in the last 168 hours. Cardiac Enzymes: No results for input(s): CKTOTAL, CKMB, CKMBINDEX, TROPONINI in the last 168 hours. BNP (last 3 results) No results for input(s): PROBNP in the last 8760 hours. CBG: Recent Labs  Lab 09/23/19 1617 09/23/19 2331 09/24/19 0741 09/24/19 1200 09/24/19 1655  GLUCAP 132* 145* 93 147* 144*   Studies: No results found.   Time spent: 35 minutes  Author: Berle Mull, MD [facility] 09/24/2019 5:59 PM  To reach On-call, see care teams to locate the attending and reach out to them via [email or web address]. If 7PM-7AM, please contact night-coverage If you still have difficulty reaching the attending provider, please page the [facility] (  Director on Call) for [facility] on [place] for assistance.

## 2019-09-24 NOTE — Evaluation (Signed)
Occupational Therapy Evaluation Patient Details Name: Tami Skinner MRN: [id number] DOB: 01-11-1946 Today's Date: 09/24/2019    History of Present Illness 73 y.o. Caucasian female with a known history of COPD, type II diabetes mellitus, hypertension, dyslipidemia and obstructive sleep apnea, who presented to the emergency room with acute onset of altered mental status with worsening somnolence, lethargy and generalized weakness who was sent from pulmonary clinic for hypercarbia.    Clinical Impression   Ms. Hesch was seen for OT evaluation this date. Pt received semi-supine in bed with caregiver (husband "Timmothy Sours") present at bedside. Pt generally alert and oriented t/o assessment with minimal difficulty with situational awareness, also reporting significant nausea, but agreeable to discussion with OT this date. Per pt, and confirmed by caregiver, pt requires some assistance with ADL management at baseline including using a 4WW for all household mobility (pt does not leave the home), and receiving physical assistance for bathing and lower body dressing. Pt lives in a 1 level home with her spouse and has a PCA who provides assistance 6 hours/day 4 days/week. Pt on 2-3 liters of O2 at home. Pt reports becoming easily fatigued or out of breath with minimal exertion. Pt currently requires minimal to moderate assist for functional mobility and heavy ADL management including lower body dressing and bathing. Pt endorses significant concerns over falling in her shower at home. Pt and provider discussed safe shower transfer strategies this date. Pt and caregiver also educated in energy conservation conservation strategies including pursed lip breathing, activity pacing, home/routines modifications, work simplification, AE/DME, prioritizing of meaningful occupations, and falls prevention. Handout provided. Pt verbalized understanding but would benefit from additional skilled OT services to maximize recall and  carryover of learned techniques and facilitate implementation of learned techniques into daily routines. Upon discharge, STR to maximize pt safety and return to PLOF.      Follow Up Recommendations  SNF    Equipment Recommendations  3 in 1 bedside commode    Recommendations for Other Services       Precautions / Restrictions Precautions Precautions: Fall Restrictions Weight Bearing Restrictions: No      Mobility Bed Mobility Overal bed mobility: Needs Assistance Bed Mobility: Supine to Sit     Supine to sit: Min assist        Transfers Overall transfer level: Needs assistance Equipment used: Rolling walker (2 wheeled) Transfers: Sit to/from Stand Sit to Stand: Min assist              Balance Overall balance assessment: Needs assistance Sitting-balance support: Bilateral upper extremity supported Sitting balance-Leahy Scale: Fair                                     ADL either performed or assessed with clinical judgement   ADL Overall ADL's : Needs assistance/impaired                                       General ADL Comments: Pt currently requires min-moderate assistance for seated ADL tasks including LB dressing and bathing 2/2 increased SOB and decreased activity tolerance.     Vision Baseline Vision/History: Wears glasses Wears Glasses: At all times Patient Visual Report: No change from baseline       Perception     Praxis      Pertinent Vitals/Pain Pain Assessment:  No/denies pain     Hand Dominance Right   Extremity/Trunk Assessment Upper Extremity Assessment Upper Extremity Assessment: Generalized weakness(Grip 3+/5. Pt noted with limied AROM of BUE and unable to raise shoulders above 90.)   Lower Extremity Assessment Lower Extremity Assessment: Generalized weakness;Defer to PT evaluation       Communication Communication Communication: No difficulties   Cognition Arousal/Alertness:  Awake/alert Behavior During Therapy: WFL for tasks assessed/performed Overall Cognitive Status: Within Functional Limits for tasks assessed                                 General Comments: Generally A&O to self, place, and date. Limited awareness of situation. Husband corrects some information provided about hospital course. Otherwise pt cognition WFL. Able to follow 1-step VCs consistently.   General Comments  Pt SpO2 WFL t/o session, however, pt declines any level of exertional activity this date 2/2 nausea. Will continue to monitor SpO2 with activity at future sessions.    Exercises Other Exercises Other Exercises: Pt and caregiver educated on falls prevention strategies, Safe use of AE/DME for ADL management, Safe shower transfer strategies, and energy conservation strategies including PLB, activity pacing, and taking frequent rest breaks. Pt would benefit from review of ECS with opportunity to implement during ADL tasks as well as trial safe shower transfer strategies to improve confidence, safety, and satisfaction with ADL performance.   Shoulder Instructions      Home Living Family/patient expects to be discharged to:: Private residence Living Arrangements: Spouse/significant other Available Help at Discharge: Family;Personal care attendant;Available 24 hours/day Type of Home: House Home Access: Level entry     Home Layout: One level     Bathroom Shower/Tub: Walk-in shower;Door(Pt has been sponge bathing for several montsh 2/2 signifcant fear of falling in shower. Husband has completely retrofitted bathroom to support her safety.)   Bathroom Toilet: Handicapped height Bathroom Accessibility: Yes   Home Equipment: Walker - 4 wheels;Shower seat - built in;Cane - single point   Additional Comments: Grab bars tub/shower to be installed prior to DC.      Prior Functioning/Environment Level of Independence: Needs assistance  Gait / Transfers Assistance Needed:  Per pt and spouse, pt uses a 4WW for all functional mobility within the home. She is not a Tourist information centre managercommunity ambulator. Pt endorses at least 2 falls in the past year. ADL's / Homemaking Assistance Needed: Pt reqires assistance from her spouse/PCA for dressing and sponge bathing. Per spouse, pt has significant fear of falling in the bathroom, she primarily uses a BSC set next to her couch/recliner where she also sleeps. Husband manages most IADLs, pt independent with medication management.   Comments: PLOF from pt and husband who was present at bedside.        OT Problem List: Decreased strength;Decreased coordination;Cardiopulmonary status limiting activity;Decreased range of motion;Decreased activity tolerance;Decreased safety awareness;Impaired balance (sitting and/or standing);Decreased knowledge of use of DME or AE      OT Treatment/Interventions:      OT Goals(Current goals can be found in the care plan section) Acute Rehab OT Goals Patient Stated Goal: To improve my breathing and do more for myself. OT Goal Formulation: With patient/family Time For Goal Achievement: 10/08/19 Potential to Achieve Goals: Good ADL Goals Pt Will Perform Grooming: sitting;with modified independence(With LRAD PRN for improved safety and functional independence.) Pt Will Perform Tub/Shower Transfer: Shower transfer;with min assist;grab bars;ambulating;shower seat(With LRAD PRN for improved safety and functional  independence.) Additional ADL Goal #1: Pt will indepenently verbalize a plan to implement at least 3 learend energy conservation strategies into her daily routines/home environment for improved safety and functional independence upon hospital DC.  OT Frequency:     Barriers to D/C:            Co-evaluation              AM-PAC OT "6 Clicks" Daily Activity     Outcome Measure Help from another person eating meals?: None Help from another person taking care of personal grooming?: A Little Help from  another person toileting, which includes using toliet, bedpan, or urinal?: A Lot Help from another person bathing (including washing, rinsing, drying)?: A Lot Help from another person to put on and taking off regular upper body clothing?: A Little Help from another person to put on and taking off regular lower body clothing?: A Lot 6 Click Score: 16   End of Session Nurse Communication: Other (comment)(Pt reporting bed linnes are wet at end of session, may need external catheter checked.)  Activity Tolerance: Patient tolerated treatment well Patient left: in bed;with call bell/phone within reach;with bed alarm set;with family/visitor present  OT Visit Diagnosis: Other abnormalities of gait and mobility (R26.89);Muscle weakness (generalized) (M62.81);History of falling (Z91.81)                Time: 5329-9242 OT Time Calculation (min): 33 min Charges:  OT General Charges $OT Visit: 1 Visit OT Evaluation $OT Eval Moderate Complexity: 1 Mod OT Treatments $Self Care/Home Management : 23-37 mins  Rockney Ghee, M.S., OTR/L Ascom: [phone] 09/24/19, 4:18 PM

## 2019-09-25 LAB — GLUCOSE, CAPILLARY: Glucose-Capillary: 147 mg/dL — ABNORMAL HIGH (ref 70–99)

## 2019-09-25 MED ORDER — IPRATROPIUM-ALBUTEROL 0.5-2.5 (3) MG/3ML IN SOLN
3.0000 mL | Freq: Four times a day (QID) | RESPIRATORY_TRACT | Status: DC
  Administered 2019-09-25 – 2019-09-27 (×7): 3 mL via RESPIRATORY_TRACT
  Filled 2019-09-25 (×7): qty 3

## 2019-09-25 NOTE — Progress Notes (Signed)
CPAP refused 

## 2019-09-25 NOTE — Plan of Care (Signed)
  Problem: Clinical Measurements: Goal: Will remain free from infection Outcome: Progressing Goal: Diagnostic test results will improve Outcome: Progressing Goal: Respiratory complications will improve Outcome: Progressing   Problem: Activity: Goal: Risk for activity intolerance will decrease Outcome: Progressing   Problem: Safety: Goal: Ability to remain free from injury will improve Outcome: Progressing   

## 2019-09-25 NOTE — Progress Notes (Signed)
[facility] Progress Note  Patient: Tami Skinner FIE:[id number]   PCP: Jearld Fenton, NP DOB: [birth year]   DOA: 09/22/2019   DOS: 09/25/2019   Date of Service: the patient was seen and examined on 09/25/2019  Chief Complaint  Patient presents with  . Fatigue  . Shortness of Breath   Brief hospital course: Pt. with PMH of COPD, HTN, HLD, OSA, type II DM; presented with complain of cough and shortness of breath, was found to have COPD exacerbation.  Currently further plan is continue current care.    Subjective: feeling better, but looks tachypneic. No chest pain no nausea or vomiting.   Assessment and Plan: Scheduled Meds: . B-complex with vitamin C  1 tablet Oral Daily  . budesonide (PULMICORT) nebulizer solution  0.5 mg Nebulization BID  . busPIRone  10 mg Oral TID  . Chlorhexidine Gluconate Cloth  6 each Topical Daily  . cholecalciferol  2,000 Units Oral Daily  . doxycycline  100 mg Oral Q12H  . enoxaparin (LOVENOX) injection  40 mg Subcutaneous Q24H  . feeding supplement (ENSURE ENLIVE)  237 mL Oral BID BM  . guaiFENesin  600 mg Oral BID  . insulin aspart  0-15 Units Subcutaneous TID WC  . ipratropium-albuterol  3 mL Nebulization Q6H  . mouth rinse  15 mL Mouth Rinse BID  . metoprolol succinate  50 mg Oral Daily  . oxybutynin  5 mg Oral QHS  . pantoprazole  40 mg Oral Daily  . predniSONE  20 mg Oral Q breakfast  . rosuvastatin  5 mg Oral Daily  . sertraline  200 mg Oral BID   Continuous Infusions: PRN Meds: acetaminophen **OR** acetaminophen, ALPRAZolam, ALPRAZolam, chlorpheniramine-HYDROcodone, furosemide, ipratropium-albuterol, labetalol, magnesium hydroxide, ondansetron **OR** ondansetron (ZOFRAN) IV, traZODone  1.  Acute hypoxic and hypercarbic respiratory failure secondary to severe COPD exacerbation.  Chest x-ray unremarkable. Tachypneic, tachycardic on admission. Required BiPAP in the emergency department as She progressed. SARS Covid test negative.  Continue oral steroids. Continue duo nebs. Transition to doxycycline. Monitor cultures. Off of BiPAP. Appreciate CCM input.  2.  Acute bronchitis, likely the culprit for her COPD exacerbation.  Management as above.  3.  Hypertensive urgency.  We will continue Toprol-XL and place the patient on as needed IV labetalol.  4.  Type 2 diabetes mellitus.  The patient will be placed on supplement coverage with NovoLog.  5.  Dyslipidemia.  We will continue statin therapy.  6.  GERD.  PPI therapy will be resumed especially given current steroid therapy.  7.  Acute metabolic encephalopathy. Likely in the setting of hypoxia. CT scan of the head unremarkable. Metabolic work-up so far also unremarkable. Continue to monitor closely. We will provide some B1 and B12.  Diet: Cardiac diet and Carb modified diet  DVT Prophylaxis: Subcutaneous Heparin    Advance goals of care discussion: DNR  Family Communication: no family was present at bedside, at the time of interview.   Disposition:  Discharge SNF  Consultants: PCCM Palliative care  Procedures: none  Antibiotics: Anti-infectives (From admission, onward)   Start     Dose/Rate Route Frequency Ordered Stop   09/23/19 1000  doxycycline (VIBRA-TABS) tablet 100 mg     100 mg Oral Every 12 hours 09/23/19 0755     09/23/19 0000  cephALEXin (KEFLEX) capsule 500 mg  Status:  Discontinued     500 mg Oral Every 6 hours 09/22/19 2226 09/23/19 0755   09/22/19 2200  cefTRIAXone (ROCEPHIN) 1 g in sodium  chloride 0.9 % 100 mL IVPB  Status:  Discontinued     1 g 200 mL/hr over 30 Minutes Intravenous Every 24 hours 09/22/19 2010 09/22/19 2201   09/22/19 2200  azithromycin (ZITHROMAX) 500 mg in sodium chloride 0.9 % 250 mL IVPB  Status:  Discontinued     500 mg 250 mL/hr over 60 Minutes Intravenous Every 24 hours 09/22/19 2010 09/22/19 2231     Objective: Physical Exam: Vitals:   09/24/19 2027 09/25/19 0510 09/25/19 0848 09/25/19 1557  BP:  (!) 157/81 (!) 174/88 (!) 172/87 134/76  Pulse: 77 72 70 75  Resp: 18 18 19 18   Temp: 98.6 F (37 C) 98.1 F (36.7 C) 98.5 F (36.9 C) 98.6 F (37 C)  TempSrc: Oral   Oral  SpO2: 96% 97% 96% 97%  Weight:  68.1 kg    Height:        Intake/Output Summary (Last 24 hours) at 09/25/2019 1728 Last data filed at 09/25/2019 1300 Gross per 24 hour  Intake 480 ml  Output 700 ml  Net -220 ml   Filed Weights   09/22/19 2100 09/24/19 0612 09/25/19 0510  Weight: 69.4 kg 68.6 kg 68.1 kg   General: alert and oriented to time, place, and person. Appear in moderate distress, affect appropriate Eyes: PERRL, Conjunctiva normal ENT: Oral Mucosa Clear, moist  Neck: no JVD, no Abnormal Mass Or lumps Cardiovascular: S1 and S2 Present, no Murmur,  Respiratory: good respiratory effort, Bilateral Air entry equal and Decreased, no signs of accessory muscle use, no Crackles, bilateral expiratory wheezes Abdomen: Bowel Sound present, Soft and no tenderness, no hernia Skin: no rashes  Extremities: no Pedal edema, no calf tenderness Neurologic: without any new focal findings Gait not checked due to patient safety concerns  Data Reviewed: I have personally reviewed and interpreted daily labs, tele strips, imagings as discussed above. I reviewed all nursing notes, pharmacy notes, vitals, pertinent old records I have discussed plan of care as described above with RN and patient/family.  CBC: Recent Labs  Lab 09/22/19 1432 09/23/19 0430  WBC 7.8 6.8  HGB 14.5 11.3*  HCT 47.5* 36.8  MCV 95.4 90.4  PLT 257 188   Basic Metabolic Panel: Recent Labs  Lab 09/22/19 1432 09/23/19 0430  NA 140 140  K 4.1 4.0  CL 90* 90*  CO2 37* 36*  GLUCOSE 139* 130*  BUN 20 16  CREATININE 0.77 0.79  CALCIUM 10.2 9.1    Liver Function Tests: Recent Labs  Lab 09/22/19 1432  AST 26  ALT 23  ALKPHOS 68  BILITOT 1.0  PROT 8.4*  ALBUMIN 5.0   No results for input(s): LIPASE, AMYLASE in the last 168  hours. Recent Labs  Lab 09/23/19 0856  AMMONIA 20   Coagulation Profile: No results for input(s): INR, PROTIME in the last 168 hours. Cardiac Enzymes: No results for input(s): CKTOTAL, CKMB, CKMBINDEX, TROPONINI in the last 168 hours. BNP (last 3 results) No results for input(s): PROBNP in the last 8760 hours. CBG: Recent Labs  Lab 09/23/19 2331 09/24/19 0741 09/24/19 1200 09/24/19 1655 09/24/19 2057  GLUCAP 145* 93 147* 144* 115*   Studies: No results found.   Time spent: 35 minutes  Author: 09/26/19, MD [facility] 09/25/2019 5:28 PM  To reach On-call, see care teams to locate the attending and reach out to them via [email or web address]. If 7PM-7AM, please contact night-coverage If you still have difficulty reaching the attending provider, please page the [facility] (Director on Call)  for [facility] on amion for assistance.

## 2019-09-26 LAB — MAGNESIUM: Magnesium: 2.1 mg/dL (ref 1.7–2.4)

## 2019-09-26 LAB — CBC WITH DIFFERENTIAL/PLATELET
Abs Immature Granulocytes: 0.03 10*3/uL (ref 0.00–0.07)
Basophils Absolute: 0 10*3/uL (ref 0.0–0.1)
Basophils Relative: 0 %
Eosinophils Absolute: 0.1 10*3/uL (ref 0.0–0.5)
Eosinophils Relative: 1 %
HCT: 43 % (ref 36.0–46.0)
Hemoglobin: 13.6 g/dL (ref 12.0–15.0)
Immature Granulocytes: 0 %
Lymphocytes Relative: 24 %
Lymphs Abs: 1.9 10*3/uL (ref 0.7–4.0)
MCH: 28.8 pg (ref 26.0–34.0)
MCHC: 31.6 g/dL (ref 30.0–36.0)
MCV: 91.1 fL (ref 80.0–100.0)
Monocytes Absolute: 0.7 10*3/uL (ref 0.1–1.0)
Monocytes Relative: 9 %
Neutro Abs: 5.1 10*3/uL (ref 1.7–7.7)
Neutrophils Relative %: 66 %
Platelets: 207 10*3/uL (ref 150–400)
RBC: 4.72 MIL/uL (ref 3.87–5.11)
RDW: 13.2 % (ref 11.5–15.5)
WBC: 7.8 10*3/uL (ref 4.0–10.5)
nRBC: 0 % (ref 0.0–0.2)

## 2019-09-26 LAB — COMPREHENSIVE METABOLIC PANEL
ALT: 15 U/L (ref 0–44)
AST: 18 U/L (ref 15–41)
Albumin: 3.9 g/dL (ref 3.5–5.0)
Alkaline Phosphatase: 47 U/L (ref 38–126)
Anion gap: 12 (ref 5–15)
BUN: 25 mg/dL — ABNORMAL HIGH (ref 8–23)
CO2: 32 mmol/L (ref 22–32)
Calcium: 9.9 mg/dL (ref 8.9–10.3)
Chloride: 94 mmol/L — ABNORMAL LOW (ref 98–111)
Creatinine, Ser: 0.9 mg/dL (ref 0.44–1.00)
GFR calc Af Amer: >60 mL/min (ref >60)
GFR calc non Af Amer: >60 mL/min (ref >60)
Glucose, Bld: 117 mg/dL — ABNORMAL HIGH (ref 70–99)
Potassium: 4.9 mmol/L (ref 3.5–5.1)
Sodium: 138 mmol/L (ref 135–145)
Total Bilirubin: 0.8 mg/dL (ref 0.3–1.2)
Total Protein: 6.5 g/dL (ref 6.5–8.1)

## 2019-09-26 LAB — GLUCOSE, CAPILLARY
Glucose-Capillary: 138 mg/dL — ABNORMAL HIGH (ref 70–99)
Glucose-Capillary: 144 mg/dL — ABNORMAL HIGH (ref 70–99)
Glucose-Capillary: 108 mg/dL — ABNORMAL HIGH (ref 70–99)
Glucose-Capillary: 144 mg/dL — ABNORMAL HIGH (ref 70–99)
Glucose-Capillary: 129 mg/dL — ABNORMAL HIGH (ref 70–99)
Glucose-Capillary: 157 mg/dL — ABNORMAL HIGH (ref 70–99)

## 2019-09-26 NOTE — [place] FL2 (Signed)
[place] MEDICAID FL2 LEVEL OF CARE SCREENING TOOL     IDENTIFICATION  Patient Name: Tami CouncilMary H Herrada Birthdate: 06/08/1946 Sex: female Admission Date (Current Location): 09/22/2019  [facility]   Facility and Address:  [facility], [address], [place], [place] [address]      Provider Number: [id number]  Attending Physician Name and Address:  Pennie BanterGriffith, Kelly A, DO  Relative Name and Phone Number:       Current Level of Care: Hospital Recommended Level of Care: Skilled Nursing Facility(with palliative.) Prior Approval Number:    Date Approved/Denied:   PASRR Number: Manual review  Discharge Plan: SNF(with palliative.)    Current Diagnoses: Patient Active Problem List   Diagnosis Date Noted  . Acute on chronic respiratory failure with hypoxia and hypercapnia (HCC)   . Acute respiratory failure with hypoxia and hypercapnia (HCC) 09/22/2019  . NSTEMI (non-ST elevated myocardial infarction) (HCC) 10/06/2014  . Stress-induced cardiomyopathy   . Unspecified vitamin D deficiency 05/19/2014  . Dyshidrotic eczema 10/11/2013  . BPV (benign positional vertigo) 08/02/2013  . Essential hypertension, benign 03/31/2013  . Solitary pulmonary nodule 12/20/2012  . Postmenopausal estrogen deficiency 09/07/2012  . Anxiety and depression 11/24/2011  . Diabetes mellitus type 2, controlled (HCC) 09/04/2011  . COPD (chronic obstructive pulmonary disease) (HCC) 09/04/2011  . OSA (obstructive sleep apnea) 02/17/2007  . Hyperlipidemia 04/04/2003    Orientation RESPIRATION BLADDER Height & Weight     Self, Time, Situation, Place  O2(Nasal Canula 2 L. Has a cpap at home but says she has not used it in 6 months.) Incontinent, External catheter Weight: 149 lb 3.2 oz (67.7 kg) Height:  5\' 4"  (162.6 cm)  BEHAVIORAL SYMPTOMS/MOOD NEUROLOGICAL BOWEL NUTRITION STATUS  (None) (None) Continent Diet(Carb modified.)  AMBULATORY STATUS  COMMUNICATION OF NEEDS Skin   Limited Assist Verbally Other (Comment)(Cellulitis.)                       Personal Care Assistance Level of Assistance  Bathing, Feeding, Dressing Bathing Assistance: Limited assistance Feeding assistance: Limited assistance Dressing Assistance: Limited assistance     Functional Limitations Info  Sight, Hearing, Speech Sight Info: Adequate Hearing Info: Adequate Speech Info: Adequate    SPECIAL CARE FACTORS FREQUENCY  PT (By licensed PT), OT (By licensed OT)     PT Frequency: 5 x week OT Frequency: 5 x week            Contractures Contractures Info: Not present    Additional Factors Info  Code Status, Allergies Code Status Info: DNR Allergies Info: Amoxicillin-pot Clavulanate, Daliresp (Roflumilast), Erythromycin Base, Neurontin (Gabapentin)           Current Medications (09/26/2019):  This is the current hospital active medication list Current Facility-Administered Medications  Medication Dose Route Frequency Provider Last Rate Last Dose  . acetaminophen (TYLENOL) tablet 650 mg  650 mg Oral Q6H PRN Mansy, Jan A, MD       Or  . acetaminophen (TYLENOL) suppository 650 mg  650 mg Rectal Q6H PRN Mansy, Jan A, MD      . ALPRAZolam Prudy Feeler(XANAX) tablet 0.5 mg  0.5 mg Oral TID PRN Mansy, Jan A, MD   0.5 mg at 09/25/19 1747  . ALPRAZolam Prudy Feeler(XANAX) tablet 1 mg  1 mg Oral Q8H PRN Erin FullingKasa, Kurian, MD   1 mg at 09/25/19 2040  . B-complex with vitamin C tablet 1 tablet  1 tablet Oral Daily Rolly SalterPatel, Pranav M, MD  1 tablet at 09/26/19 0908  . budesonide (PULMICORT) nebulizer solution 0.5 mg  0.5 mg Nebulization BID Awilda Bill, NP   0.5 mg at 09/26/19 0746  . busPIRone (BUSPAR) tablet 10 mg  10 mg Oral TID Mansy, Jan A, MD   10 mg at 09/26/19 0907  . chlorpheniramine-HYDROcodone (TUSSIONEX) 10-8 MG/5ML suspension 5 mL  5 mL Oral Q12H PRN Mansy, Jan A, MD      . cholecalciferol (VITAMIN D3) tablet 2,000 Units  2,000 Units Oral Daily Mansy, Arvella Merles, MD    2,000 Units at 09/26/19 [phone]  . doxycycline (VIBRA-TABS) tablet 100 mg  100 mg Oral Q12H Lavina Hamman, MD   100 mg at 09/26/19 4098  . enoxaparin (LOVENOX) injection 40 mg  40 mg Subcutaneous Q24H Mansy, Jan A, MD   40 mg at 09/25/19 2038  . feeding supplement (ENSURE ENLIVE) (ENSURE ENLIVE) liquid 237 mL  237 mL Oral BID BM Lavina Hamman, MD   237 mL at 09/26/19 0909  . furosemide (LASIX) tablet 20 mg  20 mg Oral PRN Mansy, Jan A, MD   20 mg at 09/22/19 2206  . guaiFENesin (MUCINEX) 12 hr tablet 600 mg  600 mg Oral BID Mansy, Jan A, MD   600 mg at 09/26/19 0916  . insulin aspart (novoLOG) injection 0-15 Units  0-15 Units Subcutaneous TID WC Mansy, Arvella Merles, MD   2 Units at 09/26/19 1210  . ipratropium-albuterol (DUONEB) 0.5-2.5 (3) MG/3ML nebulizer solution 3 mL  3 mL Nebulization Q6H PRN Awilda Bill, NP      . ipratropium-albuterol (DUONEB) 0.5-2.5 (3) MG/3ML nebulizer solution 3 mL  3 mL Nebulization Q6H Lavina Hamman, MD   3 mL at 09/26/19 0745  . labetalol (NORMODYNE) injection 20 mg  20 mg Intravenous Q3H PRN Mansy, Jan A, MD      . magnesium hydroxide (MILK OF MAGNESIA) suspension 30 mL  30 mL Oral Daily PRN Mansy, Jan A, MD   30 mL at 09/25/19 1747  . MEDLINE mouth rinse  15 mL Mouth Rinse BID Lavina Hamman, MD   15 mL at 09/26/19 0919  . metoprolol succinate (TOPROL-XL) 24 hr tablet 50 mg  50 mg Oral Daily Mansy, Jan A, MD   50 mg at 09/26/19 0917  . ondansetron (ZOFRAN) tablet 4 mg  4 mg Oral Q6H PRN Mansy, Jan A, MD   4 mg at 09/22/19 2218   Or  . ondansetron [facility]) injection 4 mg  4 mg Intravenous Q6H PRN Mansy, Jan A, MD      . oxybutynin (DITROPAN-XL) 24 hr tablet 5 mg  5 mg Oral QHS Mansy, Jan A, MD   5 mg at 09/25/19 2041  . pantoprazole (PROTONIX) EC tablet 40 mg  40 mg Oral Daily Mansy, Jan A, MD   40 mg at 09/26/19 0917  . predniSONE (DELTASONE) tablet 20 mg  20 mg Oral Q breakfast Flora Lipps, MD   20 mg at 09/26/19 0917  . rosuvastatin (CRESTOR) tablet 5 mg  5 mg  Oral Daily Mansy, Jan A, MD   5 mg at 09/26/19 1191  . sertraline (ZOLOFT) tablet 200 mg  200 mg Oral BID Mansy, Jan A, MD   200 mg at 09/26/19 0907  . traZODone (DESYREL) tablet 25 mg  25 mg Oral QHS PRN Mansy, Jan A, MD   25 mg at 09/25/19 2039     Discharge Medications: Please see discharge summary for a list of  discharge medications.  Relevant Imaging Results:  Relevant Lab Results:   Additional Information SS#: [id number]  Margarito Liner, LCSW

## 2019-09-26 NOTE — TOC Initial Note (Addendum)
Transition of Care [facility]) - Initial/Assessment Note    Patient Details  Name: Tami Skinner MRN: [id number] Date of Birth: 1946-07-29  [facility]) CM/SW Contact:    Candie Chroman, LCSW Phone Number: 09/26/2019, 12:41 PM  Clinical Narrative: CSW met with patient. No supports at bedside. CSW introduced role and explained that PT recommendations would be discussed. Patient is agreeable to SNF placement. [facility] is first preference. Admissions coordinator is aware but patient may have to discharge on nebulizers which is a barrier for this particular facility. Patient has a cpap machine at home but has not used it in 6 months. Patient declined CSW contacting her husband. PASARR under manual review. No further concerns. CSW encouraged patient to contact CSW as needed. CSW will continue to follow patient for support and facilitate discharge to SNF once medically stable.  4:50 pm: PASARR obtained: [id number] E. Expires 12/23. [facility] can offer only if patient not discharging on cpap, bipap, nebs, or inhalers. Per MD, patient will likely discharge on inhalers. CSW gave patient other bed offers and asked her to take time tonight to choose a second preference. Faxed clinicals to [facility] for insurance authorization review. MD will order a new COVID test.                 Expected Discharge Plan: Skilled Nursing Facility Barriers to Discharge: [place] Rosalie Gums), Insurance Authorization   Patient Goals and CMS Choice   CMS Medicare.gov Compare Post Acute Care list provided to:: Patient    Expected Discharge Plan and Services Expected Discharge Plan: [place] Choice: [place] arrangements for the past 2 months: Single Family Home                                      Prior Living Arrangements/Services Living arrangements for the past 2 months: Single Family Home Lives with::  Spouse Patient language and need for interpreter reviewed:: Yes Do you feel safe going back to the place where you live?: Yes      Need for Family Participation in Patient Care: Yes (Comment) Care giver support system in place?: Yes (comment)   Criminal Activity/Legal Involvement Pertinent to Current Situation/Hospitalization: No - Comment as needed  Activities of Daily Living      Permission Sought/Granted Permission sought to share information with : Facility Art therapist granted to share information with : Yes, Verbal Permission Granted     Permission granted to share info w AGENCY: SNF's        Emotional Assessment Appearance:: Appears stated age Attitude/Demeanor/Rapport: Engaged, Gracious Affect (typically observed): Accepting, Appropriate, Calm, Pleasant Orientation: : Oriented to Self, Oriented to Place, Oriented to  Time, Oriented to Situation Alcohol / Substance Use: Never Used Psych Involvement: No (comment)  Admission diagnosis:  Acute on chronic respiratory failure with hypoxia and hypercapnia (HCC) [O35.00, J96.22] Patient Active Problem List   Diagnosis Date Noted  . Acute on chronic respiratory failure with hypoxia and hypercapnia (HCC)   . Acute respiratory failure with hypoxia and hypercapnia ([place]) 09/22/2019  . NSTEMI (non-ST elevated myocardial infarction) ([place]) 10/06/2014  . Stress-induced cardiomyopathy   . Unspecified vitamin D deficiency 05/19/2014  . Dyshidrotic eczema 10/11/2013  . BPV (benign positional vertigo) 08/02/2013  . Essential hypertension, benign 03/31/2013  . Solitary pulmonary nodule 12/20/2012  . Postmenopausal estrogen deficiency  09/07/2012  . Anxiety and depression 11/24/2011  . Diabetes mellitus type 2, controlled ([place]) 09/04/2011  . COPD (chronic obstructive pulmonary disease) ([place]) 09/04/2011  . OSA (obstructive sleep apnea) 02/17/2007  . Hyperlipidemia 04/04/2003   PCP:  Jearld Fenton, NP Pharmacy:    [id number] [place] [email or web address] [id number]: [phone]: [phone]    Social Determinants of Health (SDOH) Interventions    Readmission Risk Interventions No flowsheet data found.

## 2019-09-26 NOTE — Progress Notes (Addendum)
Daily Progress Note   Patient Name: Tami Skinner       Date: 09/26/2019 DOB: 02-20-1946  Age: 73 y.o. MRN#: [id number] Attending Physician: Pennie Banter, DO Primary Care Physician: Lorre Munroe, NP Admit Date: 09/22/2019  Reason for Consultation/Follow-up: Establishing goals of care  Subjective: Patient is sitting on the side of the bed working with PT/OT. She has been sitting in the chair for several hours today and upon trying to stand complains of feeling dizzy. Staff working with her and checking her vitals. Will return tomorrow to attempt GOC conversation with her.   Spoke with husband. He states he is afraid of her getting COVID if going to a SNF and really wants her to return home if at all possible. Husband is unable to find her advanced directives. He states he plans to discuss GOC with her when she gets home.  Length of Stay: 4  Current Medications: Scheduled Meds:  . B-complex with vitamin C  1 tablet Oral Daily  . budesonide (PULMICORT) nebulizer solution  0.5 mg Nebulization BID  . busPIRone  10 mg Oral TID  . cholecalciferol  2,000 Units Oral Daily  . doxycycline  100 mg Oral Q12H  . enoxaparin (LOVENOX) injection  40 mg Subcutaneous Q24H  . feeding supplement (ENSURE ENLIVE)  237 mL Oral BID BM  . guaiFENesin  600 mg Oral BID  . insulin aspart  0-15 Units Subcutaneous TID WC  . ipratropium-albuterol  3 mL Nebulization Q6H  . mouth rinse  15 mL Mouth Rinse BID  . metoprolol succinate  50 mg Oral Daily  . oxybutynin  5 mg Oral QHS  . pantoprazole  40 mg Oral Daily  . predniSONE  20 mg Oral Q breakfast  . rosuvastatin  5 mg Oral Daily  . sertraline  200 mg Oral BID    Continuous Infusions:   PRN Meds: acetaminophen **OR** acetaminophen, ALPRAZolam,  ALPRAZolam, chlorpheniramine-HYDROcodone, furosemide, ipratropium-albuterol, labetalol, magnesium hydroxide, ondansetron **OR** ondansetron (ZOFRAN) IV, traZODone  Physical Exam Pulmonary:     Effort: Pulmonary effort is normal.  Neurological:     Mental Status: She is alert.             Vital Signs: BP 133/86 (BP Location: Right Arm)   Pulse 68   Temp 98.4 F (36.9 C) (Oral)  Resp 18   Ht 5\' 4"  (1.626 m)   Wt 67.7 kg   SpO2 97%   BMI 25.61 kg/m  SpO2: SpO2: 97 % O2 Device: O2 Device: Nasal Cannula O2 Flow Rate: O2 Flow Rate (L/min): 2 L/min  Intake/output summary:   Intake/Output Summary (Last 24 hours) at 09/26/2019 1140 Last data filed at 09/26/2019 1114 Gross per 24 hour  Intake 840 ml  Output 750 ml  Net 90 ml   LBM: Last BM Date: 09/26/19 Baseline Weight: Weight: 70.8 kg Most recent weight: Weight: 67.7 kg       Palliative Assessment/Data: 40%      Patient Active Problem List   Diagnosis Date Noted  . Acute on chronic respiratory failure with hypoxia and hypercapnia (HCC)   . Acute respiratory failure with hypoxia and hypercapnia ([place]) 09/22/2019  . NSTEMI (non-ST elevated myocardial infarction) ([place]) 10/06/2014  . Stress-induced cardiomyopathy   . Unspecified vitamin D deficiency 05/19/2014  . Dyshidrotic eczema 10/11/2013  . BPV (benign positional vertigo) 08/02/2013  . Essential hypertension, benign 03/31/2013  . Solitary pulmonary nodule 12/20/2012  . Postmenopausal estrogen deficiency 09/07/2012  . Anxiety and depression 11/24/2011  . Diabetes mellitus type 2, controlled ([place]) 09/04/2011  . COPD (chronic obstructive pulmonary disease) ([place]) 09/04/2011  . OSA (obstructive sleep apnea) 02/17/2007  . Hyperlipidemia 04/04/2003    Palliative Care Assessment & Plan    Recommendations/Plan:  Recommend palliative to follow at D/C.     Code Status:    Code Status Orders  (From admission, onward)         Start     Ordered   09/23/19  0903  Do not attempt resuscitation (DNR)  Continuous    Question Answer Comment  In the event of cardiac or respiratory ARREST Do not call a "code blue"   In the event of cardiac or respiratory ARREST Do not perform Intubation, CPR, defibrillation or ACLS   In the event of cardiac or respiratory ARREST Use medication by any route, position, wound care, and other measures to relive pain and suffering. May use oxygen, suction and manual treatment of airway obstruction as needed for comfort.      09/23/19 5573        Code Status History    Date Active Date Inactive Code Status Order ID Comments User Context   09/22/2019 2010 09/23/2019 0903 Full Code [id number]  Mansy, Arvella Merles, MD ED   Advance Care Planning Activity    Advance Directive Documentation     Most Recent Value  Type of Advance Directive  -- [Pt states she has one but when asked which one states "I don't know".]  Pre-existing out of facility DNR order (yellow form or pink MOST form)  -  "MOST" Form in Place?  -       Prognosis:   Unable to determine  Discharge Planning:  To Be Determined   Thank you for allowing the Palliative Medicine Team to assist in the care of this patient.   Total Time 35 min Prolonged Time Billed  no       Greater than 50%  of this time was spent counseling and coordinating care related to the above assessment and plan.  Asencion Gowda, NP  Please contact Palliative Medicine Team phone at [phone] for questions and concerns.

## 2019-09-26 NOTE — Progress Notes (Signed)
Physical Therapy Treatment Patient Details Name: Tami Skinner MRN: [id number] DOB: [birth year] Today's Date: 09/26/2019    History of Present Illness 73 y.o. Caucasian female with a known history of COPD, type II diabetes mellitus, hypertension, dyslipidemia and obstructive sleep apnea, who presented to the emergency room with acute onset of altered mental status with worsening somnolence, lethargy and generalized weakness who was sent from pulmonary clinic for hypercarbia.     PT Comments    Pt in bed and stating that she will try to stand, though she doesn't think she can.  At initiation of bed mobility, pt reported onset of nausea and emesis basin kept near pt at all times.  She reported "light-headedness" when standing but also "dizziness" similar to her hx of BPPV when sitting at EOB.  Pt unable to tolerate Marye Round during this session but she did describe the maneuver accurately, stating that treatment has helped before.  Pt able to perform bed mobility min A and stand from EOB min A, but unable to tolerate standing length of time to monitor full orthostatics.  Pt BP hypertensive throughout and O2 desat to 88%, returned to 90% at rest.  RN notified and pt assisted back to bed and with positioning for comfort.  Pt asked questions about discharge to SNF and PT took time to answer.  Pt seemed more open to discharge to this setting today.  Pt will continue to benefit from skilled PT with focus on strength, tolerance to activity and safe functional mobility.  Follow Up Recommendations  SNF     Equipment Recommendations  None recommended by PT(TBD at next venue of care)    Recommendations for Other Services       Precautions / Restrictions Precautions Precautions: Fall Restrictions Weight Bearing Restrictions: No    Mobility  Bed Mobility Overal bed mobility: Needs Assistance Bed Mobility: Supine to Sit     Supine to sit: Min assist     General bed mobility comments:  Assistance with bringing LE's over EOB and hand held assist to bring pt upright.  Nurse tech assisted in scooting pt up in bed as pt was very weak and nauseated.  Transfers Overall transfer level: Needs assistance Equipment used: Rolling walker (2 wheeled) Transfers: Sit to/from Stand Sit to Stand: Min assist;From elevated surface         General transfer comment: Able to stand from bedside but unable to scoot along EOB once sitting again.  Ambulation/Gait                 Stairs             Wheelchair Mobility    Modified Rankin (Stroke Patients Only)       Balance Overall balance assessment: Needs assistance Sitting-balance support: Bilateral upper extremity supported Sitting balance-Leahy Scale: Fair       Standing balance-Leahy Scale: Fair Standing balance comment: Leans heavily on RW with flexed posture.                            Cognition Arousal/Alertness: Awake/alert Behavior During Therapy: WFL for tasks assessed/performed Overall Cognitive Status: Within Functional Limits for tasks assessed                                 General Comments: Follows directions consistently, oriented to self and situation.      Exercises Other Exercises  Other Exercises: Time to assess vitals, manage pt nausea and "lightheadedness".  Pt attempted to scoot along EOB but unable.  R/o BPPV which pt has a hx of.  Pt unable to tolerate Gilberto Better at this time. x15 min    General Comments        Pertinent Vitals/Pain Pain Assessment: No/denies pain    Home Living                      Prior Function            PT Goals (current goals can now be found in the care plan section) Acute Rehab PT Goals Patient Stated Goal: To improve my breathing and do more for myself. Progress towards PT goals: Not progressing toward goals - comment(Pt very nauseated this session and limited by "dizziness".)    Frequency    Min  2X/week      PT Plan Current plan remains appropriate    Co-evaluation              AM-PAC PT "6 Clicks" Mobility   Outcome Measure  Help needed turning from your back to your side while in a flat bed without using bedrails?: A Little Help needed moving from lying on your back to sitting on the side of a flat bed without using bedrails?: A Little Help needed moving to and from a bed to a chair (including a wheelchair)?: A Lot Help needed standing up from a chair using your arms (e.g., wheelchair or bedside chair)?: A Lot Help needed to walk in hospital room?: A Lot Help needed climbing 3-5 steps with a railing? : A Lot 6 Click Score: 14    End of Session Equipment Utilized During Treatment: Gait belt Activity Tolerance: Patient limited by fatigue Patient left: in bed;with call bell/phone within reach;with bed alarm set Nurse Communication: Mobility status PT Visit Diagnosis: Muscle weakness (generalized) (M62.81);Difficulty in walking, not elsewhere classified (R26.2)     Time: 1125-1203 PT Time Calculation (min) (ACUTE ONLY): 38 min  Charges:  $Therapeutic Exercise: 8-22 mins $Therapeutic Activity: 23-37 mins                     Glenetta Hew, PT, DPT    Glenetta Hew 09/26/2019, 1:29 PM

## 2019-09-27 ENCOUNTER — Other Ambulatory Visit: Payer: Self-pay | Admitting: Cardiovascular Disease

## 2019-09-27 ENCOUNTER — Ambulatory Visit: Payer: Medicare Other | Admitting: Cardiovascular Disease

## 2019-09-27 ENCOUNTER — Other Ambulatory Visit: Payer: Self-pay | Admitting: Internal Medicine

## 2019-09-27 LAB — SARS CORONAVIRUS 2 (TAT 6-24 HRS): SARS Coronavirus 2: NEGATIVE

## 2019-09-27 LAB — GLUCOSE, CAPILLARY
Glucose-Capillary: 111 mg/dL — ABNORMAL HIGH (ref 70–99)
Glucose-Capillary: 138 mg/dL — ABNORMAL HIGH (ref 70–99)

## 2019-09-27 LAB — VITAMIN B1: Vitamin B1 (Thiamine): 112.7 nmol/L (ref 66.5–200.0)

## 2019-09-27 MED ORDER — GUAIFENESIN ER 600 MG PO TB12: 600.0000 mg | ORAL_TABLET | Freq: Two times a day (BID) | ORAL | 0 refills | Status: AC

## 2019-09-27 MED ORDER — IPRATROPIUM-ALBUTEROL 0.5-2.5 (3) MG/3ML IN SOLN
3.0000 mL | Freq: Three times a day (TID) | RESPIRATORY_TRACT | Status: DC
  Administered 2019-09-27: 3 mL via RESPIRATORY_TRACT
  Filled 2019-09-27: qty 3

## 2019-09-27 MED ORDER — B COMPLEX-C PO TABS: 1.0000 | ORAL_TABLET | Freq: Every day | ORAL | 0 refills | Status: DC

## 2019-09-27 MED ORDER — ENSURE ENLIVE PO LIQD: 237.0000 mL | Freq: Two times a day (BID) | ORAL | 12 refills | Status: AC

## 2019-09-27 MED ORDER — PREDNISONE 20 MG PO TABS: 20.0000 mg | ORAL_TABLET | Freq: Every day | ORAL | 0 refills | Status: DC

## 2019-09-27 NOTE — Progress Notes (Signed)
New referral for [employer] Palliative program to follow at home received from [facility]. Plan is for discharge today, patient will also be followed by [facility] home health. Patient information given to referral. Flo Shanks BSN, RN, [place] [phone]

## 2019-09-27 NOTE — Discharge Summary (Signed)
Physician Discharge Summary  CARLESHA SEIPLE YNW:[id number] DOB: 09-29-1946 DOA: 09/22/2019  PCP: Lorre Munroe, NP  Admit date: 09/22/2019 Discharge date: 09/27/2019  Admitted From: Home Disposition:  Home  Recommendations for Outpatient Follow-up:  1. Follow up with PCP in 1-2 weeks 2. Please obtain BMP/CBC in one week   Home Health: Yes  Equipment/Devices: None   Discharge Condition: Stable  CODE STATUS: DNR  Diet recommendation: Carb Modified   Brief/Interim Summary:  Pt. with PMH of COPD, HTN, HLD, OSA, type II DM; presented with complain of cough and shortness of breath, was found to have acute COPD exacerbation with acute on chronic hypoxic respiratory failure and acute bronchitis.  She was initially on BiPAP.  Covid was negative.  She was admitted, started on steroid and nebulizers, as well as Rocephin/Azithro for any component of PNA possibly contributing.  Oxygen was weaned down to 2 L/min.  Patient at baseline requires 2-3 L/min.  Patient was evaluated by PT, recommended SNF for rehab, however patient and family did not want her to go due to Covid.  She was set up with all necessary home health services.  Clinically improved and stable for discharge, back to baseline from respiratory standpoint.       Discharge Diagnoses: Active Problems:   Acute respiratory failure with hypoxia and hypercapnia (HCC)   Acute on chronic respiratory failure with hypoxia and hypercapnia (HCC)  Acute hypoxic and hypercarbic respiratory failure secondary to severe COPD exacerbation. Chest x-ray unremarkable. Tachypneic, tachycardic on admission. Required BiPAP in the emergency department as She progressed.  Weaned to baseline O2 requirement of 2-3 L/min. SARS Covid test negative. Continue oral steroids. Continue duo nebs. Transition to doxycycline. Monitor cultures. Off of BiPAP. Appreciate CCM input.  Acute bronchitis, likely the culprit for her COPD exacerbation.Management as  above.  Hypertensive urgency.We will continue Toprol-XLand place the patient on as needed IV labetalol.  Type 2 diabetes mellitus.The patient will be placed on supplement coverage with NovoLog.  Dyslipidemia.We will continue statin therapy.  GERD.PPI therapy will be resumed especially given current steroid therapy.  Acute metabolic encephalopathy. Likely in the setting of hypoxia. CT scan of the head unremarkable. Metabolic work-up so far also unremarkable. Continue to monitor closely. We will provide some B1 and B12.  Discharge Instructions   Discharge Instructions    Call MD for:  temperature >100.4   Complete by: As directed    Diet - low sodium heart healthy   Complete by: As directed    Increase activity slowly   Complete by: As directed      Allergies as of 09/27/2019      Reactions   Amoxicillin-pot Clavulanate    REACTION: stomach sensitive.   Daliresp [roflumilast]    Severe nausea    Erythromycin Base    REACTION: GI upset   Neurontin [gabapentin]    anxiety      Medication List    STOP taking these medications   azithromycin 250 MG tablet Commonly known as: Zithromax Z-Pak   budesonide 0.25 MG/2ML nebulizer solution Commonly known as: PULMICORT     TAKE these medications   albuterol 108 (90 Base) MCG/ACT inhaler Commonly known as: VENTOLIN HFA INHALE 2 PUFFS INTO THE LUNGS EVERY 4 (FOUR) HOURS AS NEEDED FOR WHEEZING OR SHORTNESS OF BREATH.   albuterol (2.5 MG/3ML) 0.083% nebulizer solution Commonly known as: PROVENTIL Take 3 mLs (2.5 mg total) by nebulization every 6 (six) hours as needed for wheezing or shortness of breath. J44.9  ALPRAZolam 0.5 MG tablet Commonly known as: XANAX Take 1 tablet (0.5 mg total) by mouth 3 (three) times daily as needed for anxiety.   B-complex with vitamin C tablet Take 1 tablet by mouth daily. Start taking on: September 28, 2019   buPROPion 150 MG 24 hr tablet Commonly known as: WELLBUTRIN  XL Take 150 mg by mouth daily.   busPIRone 10 MG tablet Commonly known as: BUSPAR Take 1 tablet (10 mg total) by mouth 3 (three) times daily.   feeding supplement (ENSURE ENLIVE) Liqd Take 237 mLs by mouth 2 (two) times daily between meals.   furosemide 20 MG tablet Commonly known as: LASIX Take 1 tablet (20 mg total) by mouth as needed for edema.   guaiFENesin 600 MG 12 hr tablet Commonly known as: MUCINEX Take 1 tablet (600 mg total) by mouth 2 (two) times daily.   metoprolol succinate 50 MG 24 hr tablet Commonly known as: TOPROL-XL TAKE 1 TABLET (50 MG TOTAL) BY MOUTH DAILY. TAKE WITH OR IMMEDIATELY FOLLOWING A MEAL.   nystatin powder Commonly known as: MYCOSTATIN/NYSTOP Apply topically 3 (three) times daily.   omeprazole 40 MG capsule Commonly known as: PRILOSEC Take 1 capsule (40 mg total) by mouth 2 (two) times daily.   ondansetron 8 MG disintegrating tablet Commonly known as: Zofran ODT Take 1 tablet (8 mg total) by mouth every 8 (eight) hours as needed for nausea or vomiting.   ONE TOUCH ULTRA TEST test strip Generic drug: glucose blood 1 EACH BY OTHER ROUTE DAILY AS NEEDED FOR OTHER.   oxybutynin 5 MG 24 hr tablet Commonly known as: DITROPAN-XL TAKE 1 TABLET BY MOUTH EVERYDAY AT BEDTIME What changed: See the new instructions.   predniSONE 20 MG tablet Commonly known as: DELTASONE Take 1 tablet (20 mg total) by mouth daily with breakfast. Start taking on: September 28, 2019   Premarin vaginal cream Generic drug: conjugated estrogens Apply 0.5g (pea-sized amount) just inside the vaginal introitus with a finger-tip on Monday,Wednesday and Friday night   rosuvastatin 5 MG tablet Commonly known as: CRESTOR TAKE 1 TABLET BY MOUTH EVERY DAY   sertraline 100 MG tablet Commonly known as: ZOLOFT TAKE 2 TABLETS BY MOUTH TWICE A DAY   tiotropium 18 MCG inhalation capsule Commonly known as: Spiriva HandiHaler Place 1 capsule (18 mcg total) into inhaler and  inhale daily.   Vitamin D-3 25 MCG (1000 UT) Caps Take 2,000 Units by mouth daily.   Wixela Inhub 250-50 MCG/DOSE Aepb Generic drug: Fluticasone-Salmeterol 1 puff 2 (two) times daily.      Follow-up Information    [facility]   Specialty: Home Health Services Why: They will follow up with you for your home health needs. Contact information: [address] [place] [address] [phone]          Allergies  Allergen Reactions  . Amoxicillin-Pot Clavulanate     REACTION: stomach sensitive.  Lenice Llamas [Roflumilast]     Severe nausea   . Erythromycin Base     REACTION: GI upset  . Neurontin [Gabapentin]     anxiety    Consultations:  PCCM  Palliative Care    Procedures/Studies: Dg Chest 2 View  Result Date: 09/22/2019 CLINICAL DATA:  Shortness of breath EXAM: CHEST - 2 VIEW COMPARISON:  May 07, 2019 FINDINGS: There is mild cardiomegaly. Aortic knob calcification. Both lungs are clear. No acute osseous abnormality. IMPRESSION: No active cardiopulmonary disease. Electronically Signed   By: Heywood Bene.D.  On: 09/22/2019 15:03   Ct Head Wo Contrast  Result Date: 09/23/2019 CLINICAL DATA:  Altered level of consciousness. EXAM: CT HEAD WITHOUT CONTRAST TECHNIQUE: Contiguous axial images were obtained from the base of the skull through the vertex without intravenous contrast. COMPARISON:  None. FINDINGS: Brain: No evidence of acute infarction, hemorrhage, hydrocephalus, extra-axial collection or mass lesion/mass effect. Vascular: No hyperdense vessel or unexpected calcification. Skull: Normal. Negative for fracture or focal lesion. Sinuses/Orbits: No acute finding. Other: None. IMPRESSION: Normal head CT. Electronically Signed   By: Lupita Raider M.D.   On: 09/23/2019 11:28   US Venous Img Lower Bilateral (dvt)  Result Date: 09/23/2019 CLINICAL DATA:  73 year old female with erythema/swelling EXAM: BILATERAL LOWER EXTREMITY VENOUS DOPPLER  ULTRASOUND TECHNIQUE: Gray-scale sonography with graded compression, as well as color Doppler and duplex ultrasound were performed to evaluate the lower extremity deep venous systems from the level of the common femoral vein and including the common femoral, femoral, profunda femoral, popliteal and calf veins including the posterior tibial, peroneal and gastrocnemius veins when visible. The superficial great saphenous vein was also interrogated. Spectral Doppler was utilized to evaluate flow at rest and with distal augmentation maneuvers in the common femoral, femoral and popliteal veins. COMPARISON:  None. FINDINGS: RIGHT LOWER EXTREMITY Common Femoral Vein: No evidence of thrombus. Normal compressibility, respiratory phasicity and response to augmentation. Saphenofemoral Junction: No evidence of thrombus. Normal compressibility and flow on color Doppler imaging. Profunda Femoral Vein: No evidence of thrombus. Normal compressibility and flow on color Doppler imaging. Femoral Vein: No evidence of thrombus. Normal compressibility, respiratory phasicity and response to augmentation. Popliteal Vein: No evidence of thrombus. Normal compressibility, respiratory phasicity and response to augmentation. Calf Veins: No evidence of thrombus. Normal compressibility and flow on color Doppler imaging. Superficial Great Saphenous Vein: No evidence of thrombus. Normal compressibility and flow on color Doppler imaging. Other Findings:  None. LEFT LOWER EXTREMITY Common Femoral Vein: No evidence of thrombus. Normal compressibility, respiratory phasicity and response to augmentation. Saphenofemoral Junction: No evidence of thrombus. Normal compressibility and flow on color Doppler imaging. Profunda Femoral Vein: No evidence of thrombus. Normal compressibility and flow on color Doppler imaging. Femoral Vein: No evidence of thrombus. Normal compressibility, respiratory phasicity and response to augmentation. Popliteal Vein: No evidence  of thrombus. Normal compressibility, respiratory phasicity and response to augmentation. Calf Veins: No evidence of thrombus. Normal compressibility and flow on color Doppler imaging. Superficial Great Saphenous Vein: No evidence of thrombus. Normal compressibility and flow on color Doppler imaging. Other Findings:  None. IMPRESSION: Sonographic survey of the bilateral lower extremities negative for DVT Electronically Signed   By: Gilmer Mor D.O.   On: 09/23/2019 07:52      Venous doppler U/S lower extremities - negative for DVT   Subjective: Patient awake sitting up in bed.  Now declines to go to rehab/SNF.  Saying she wants home health instead.  Denies fever/chills, chest pain.  States breathing feels stable.  Asks if she can go home today.   Discharge Exam: Vitals:   09/27/19 0359 09/27/19 0818  BP: 134/67 114/65  Pulse: 86 81  Resp: 18 18  Temp: 98.5 F (36.9 C) 98.4 F (36.9 C)  SpO2: 95% 96%   Vitals:   09/26/19 2133 09/27/19 0211 09/27/19 0359 09/27/19 0818  BP: 116/76  134/67 114/65  Pulse: 80  86 81  Resp: (!) 21  18 18   Temp: 98.4 F (36.9 C)  98.5 F (36.9 C) 98.4 F (36.9 C)  TempSrc: Oral  Oral Oral  SpO2: 96% 95% 95% 96%  Weight:   66.4 kg   Height:        General: Pt is alert, awake, not in acute distress Cardiovascular: RRR, S1/S2 +, no rubs, no gallops Respiratory: Decreased breath sounds due to poor air movement, minimal expiratory wheezes, no rhonchi Abdominal: Soft, NT, ND, bowel sounds + Extremities: no edema, no cyanosis    The results of significant diagnostics from this hospitalization (including imaging, microbiology, ancillary and laboratory) are listed below for reference.     Microbiology: Recent Results (from the past 240 hour(s))  SARS Coronavirus 2 by RT PCR (hospital order, performed in [facility] hospital lab) Nasopharyngeal Nasopharyngeal Swab     Status: None   Collection Time: 09/22/19  6:06 PM   Specimen: Nasopharyngeal Swab   Result Value Ref Range Status   SARS Coronavirus 2 NEGATIVE NEGATIVE Final    Comment: (NOTE) If result is NEGATIVE SARS-CoV-2 target nucleic acids are NOT DETECTED. The SARS-CoV-2 RNA is generally detectable in upper and lower  respiratory specimens during the acute phase of infection. The lowest  concentration of SARS-CoV-2 viral copies this assay can detect is 250  copies / mL. A negative result does not preclude SARS-CoV-2 infection  and should not be used as the sole basis for treatment or other  patient management decisions.  A negative result may occur with  improper specimen collection / handling, submission of specimen other  than nasopharyngeal swab, presence of viral mutation(s) within the  areas targeted by this assay, and inadequate number of viral copies  (<250 copies / mL). A negative result must be combined with clinical  observations, patient history, and epidemiological information. If result is POSITIVE SARS-CoV-2 target nucleic acids are DETECTED. The SARS-CoV-2 RNA is generally detectable in upper and lower  respiratory specimens dur ing the acute phase of infection.  Positive  results are indicative of active infection with SARS-CoV-2.  Clinical  correlation with patient history and other diagnostic information is  necessary to determine patient infection status.  Positive results do  not rule out bacterial infection or co-infection with other viruses. If result is PRESUMPTIVE POSTIVE SARS-CoV-2 nucleic acids MAY BE PRESENT.   A presumptive positive result was obtained on the submitted specimen  and confirmed on repeat testing.  While 2019 novel coronavirus  (SARS-CoV-2) nucleic acids may be present in the submitted sample  additional confirmatory testing may be necessary for epidemiological  and / or clinical management purposes  to differentiate between  SARS-CoV-2 and other Sarbecovirus currently known to infect humans.  If clinically indicated additional  testing with an alternate test  methodology [phone]) is advised. The SARS-CoV-2 RNA is generally  detectable in upper and lower respiratory sp ecimens during the acute  phase of infection. The expected result is Negative. Fact Sheet for Patients:  BoilerBrush.com.cy Fact Sheet for Healthcare Providers: https://pope.com/ This test is not yet approved or cleared by the [place] FDA and has been authorized for detection and/or diagnosis of SARS-CoV-2 by FDA under an Emergency Use Authorization (EUA).  This EUA will remain in effect (meaning this test can be used) for the duration of the COVID-19 declaration under Section 564(b)(1) of the Act, 21 U.S.C. section 360bbb-3(b)(1), unless the authorization is terminated or revoked sooner. Performed at [facility], [address], [place], [place] [address]   MRSA PCR Screening     Status: None   Collection Time: 09/22/19  9:04 PM  Specimen: Nasal Mucosa; Nasopharyngeal  Result Value Ref Range Status   MRSA by PCR NEGATIVE NEGATIVE Final    Comment:        The GeneXpert MRSA Assay (FDA approved for NASAL specimens only), is one component of a comprehensive MRSA colonization surveillance program. It is not intended to diagnose MRSA infection nor to guide or monitor treatment for MRSA infections. Performed at [facility], [address] [place], [place] [address]   Culture, blood (routine x 2) Call MD if unable to obtain prior to antibiotics being given     Status: None (Preliminary result)   Collection Time: 09/22/19  9:41 PM   Specimen: BLOOD  Result Value Ref Range Status   Specimen Description BLOOD BLOOD LEFT HAND  Final   Special Requests   Final    BOTTLES DRAWN AEROBIC AND ANAEROBIC Blood Culture adequate volume   Culture   Final    NO GROWTH 4 DAYS Performed at [facility], [address], [place], [place] [address]    Report Status  PENDING  Incomplete  Culture, blood (routine x 2) Call MD if unable to obtain prior to antibiotics being given     Status: None (Preliminary result)   Collection Time: 09/22/19  9:47 PM   Specimen: BLOOD  Result Value Ref Range Status   Specimen Description BLOOD BLOOD LEFT WRIST  Final   Special Requests   Final    BOTTLES DRAWN AEROBIC ONLY Blood Culture results may not be optimal due to an inadequate volume of blood received in culture bottles   Culture   Final    NO GROWTH 4 DAYS Performed at [facility], [address] [place], [place] [address]    Report Status PENDING  Incomplete  SARS CORONAVIRUS 2 (TAT 6-24 HRS) Nasopharyngeal Nasopharyngeal Swab     Status: None   Collection Time: 09/26/19  5:06 PM   Specimen: Nasopharyngeal Swab  Result Value Ref Range Status   SARS Coronavirus 2 NEGATIVE NEGATIVE Final    Comment: (NOTE) SARS-CoV-2 target nucleic acids are NOT DETECTED. The SARS-CoV-2 RNA is generally detectable in upper and lower respiratory specimens during the acute phase of infection. Negative results do not preclude SARS-CoV-2 infection, do not rule out co-infections with other pathogens, and should not be used as the sole basis for treatment or other patient management decisions. Negative results must be combined with clinical observations, patient history, and epidemiological information. The expected result is Negative. Fact Sheet for Patients: HairSlick.no Fact Sheet for Healthcare Providers: quierodirigir.com This test is not yet approved or cleared by the [place] FDA and  has been authorized for detection and/or diagnosis of SARS-CoV-2 by FDA under an Emergency Use Authorization (EUA). This EUA will remain  in effect (meaning this test can be used) for the duration of the COVID-19 declaration under Section 56 4(b)(1) of the Act, 21 U.S.C. section 360bbb-3(b)(1), unless the authorization  is terminated or revoked sooner. Performed at [facility], [address], [place], [place] [address]      Labs: BNP (last 3 results) Recent Labs    05/07/19 2104  BNP 254.5*   Basic Metabolic Panel: Recent Labs  Lab 09/22/19 1432 09/23/19 0430 09/26/19 0527  NA 140 140 138  K 4.1 4.0 4.9  CL 90* 90* 94*  CO2 37* 36* 32  GLUCOSE 139* 130* 117*  BUN 20 16 25*  CREATININE 0.77 0.79 0.90  CALCIUM 10.2 9.1 9.9  MG  --   --  2.1   Liver Function Tests: Recent Labs  Lab 09/22/19 1432 09/26/19 0527  AST 26 18  ALT 23 15  ALKPHOS 68 47  BILITOT 1.0 0.8  PROT 8.4* 6.5  ALBUMIN 5.0 3.9   No results for input(s): LIPASE, AMYLASE in the last 168 hours. Recent Labs  Lab 09/23/19 0856  AMMONIA 20   CBC: Recent Labs  Lab 09/22/19 1432 09/23/19 0430 09/26/19 0527  WBC 7.8 6.8 7.8  NEUTROABS  --   --  5.1  HGB 14.5 11.3* 13.6  HCT 47.5* 36.8 43.0  MCV 95.4 90.4 91.1  PLT 257 188 207   Cardiac Enzymes: No results for input(s): CKTOTAL, CKMB, CKMBINDEX, TROPONINI in the last 168 hours. BNP: Invalid input(s): POCBNP CBG: Recent Labs  Lab 09/26/19 1144 09/26/19 1630 09/26/19 2130 09/27/19 0756 09/27/19 1127  GLUCAP 144* 129* 157* 111* 138*   D-Dimer No results for input(s): DDIMER in the last 72 hours. Hgb A1c No results for input(s): HGBA1C in the last 72 hours. Lipid Profile No results for input(s): CHOL, HDL, LDLCALC, TRIG, CHOLHDL, LDLDIRECT in the last 72 hours. Thyroid function studies No results for input(s): TSH, T4TOTAL, T3FREE, THYROIDAB in the last 72 hours.  Invalid input(s): FREET3 Anemia work up No results for input(s): VITAMINB12, FOLATE, FERRITIN, TIBC, IRON, RETICCTPCT in the last 72 hours. Urinalysis    Component Value Date/Time   COLORURINE Yellow 09/27/2014 1640   COLORURINE yellow 11/07/2009 1207   APPEARANCEUR Cloudy (A) 01/06/2019 1528   LABSPEC 1.017 09/27/2014 1640   PHURINE 5.0 09/27/2014 1640   PHURINE 6.0  11/07/2009 1207   GLUCOSEU Negative 01/06/2019 1528   GLUCOSEU Negative 09/27/2014 1640   HGBUR 2+ 09/27/2014 1640   HGBUR large 11/07/2009 1207   BILIRUBINUR neg 06/07/2019 1631   BILIRUBINUR Negative 01/06/2019 1528   BILIRUBINUR Negative 09/27/2014 1640   KETONESUR Trace 09/27/2014 1640   KETONESUR NEGATIVE 12/28/2007 2015   PROTEINUR Positive (A) 06/07/2019 1631   PROTEINUR Trace (A) 01/06/2019 1528   PROTEINUR Negative 09/27/2014 1640   PROTEINUR NEGATIVE 12/28/2007 2015   UROBILINOGEN 0.2 06/07/2019 1631   UROBILINOGEN 0.2 11/07/2009 1207   NITRITE neg 06/07/2019 1631   NITRITE Negative 01/06/2019 1528   NITRITE Negative 09/27/2014 1640   NITRITE negative 11/07/2009 1207   LEUKOCYTESUR Large (3+) (A) 06/07/2019 1631   LEUKOCYTESUR 1+ (A) 01/06/2019 1528   LEUKOCYTESUR Negative 09/27/2014 1640   Sepsis Labs Invalid input(s): PROCALCITONIN,  WBC,  LACTICIDVEN Microbiology Recent Results (from the past 240 hour(s))  SARS Coronavirus 2 by RT PCR (hospital order, performed in [facility] hospital lab) Nasopharyngeal Nasopharyngeal Swab     Status: None   Collection Time: 09/22/19  6:06 PM   Specimen: Nasopharyngeal Swab  Result Value Ref Range Status   SARS Coronavirus 2 NEGATIVE NEGATIVE Final    Comment: (NOTE) If result is NEGATIVE SARS-CoV-2 target nucleic acids are NOT DETECTED. The SARS-CoV-2 RNA is generally detectable in upper and lower  respiratory specimens during the acute phase of infection. The lowest  concentration of SARS-CoV-2 viral copies this assay can detect is 250  copies / mL. A negative result does not preclude SARS-CoV-2 infection  and should not be used as the sole basis for treatment or other  patient management decisions.  A negative result may occur with  improper specimen collection / handling, submission of specimen other  than nasopharyngeal swab, presence of viral mutation(s) within the  areas targeted by this assay, and inadequate number of  viral  copies  (<250 copies / mL). A negative result must be combined with clinical  observations, patient history, and epidemiological information. If result is POSITIVE SARS-CoV-2 target nucleic acids are DETECTED. The SARS-CoV-2 RNA is generally detectable in upper and lower  respiratory specimens dur ing the acute phase of infection.  Positive  results are indicative of active infection with SARS-CoV-2.  Clinical  correlation with patient history and other diagnostic information is  necessary to determine patient infection status.  Positive results do  not rule out bacterial infection or co-infection with other viruses. If result is PRESUMPTIVE POSTIVE SARS-CoV-2 nucleic acids MAY BE PRESENT.   A presumptive positive result was obtained on the submitted specimen  and confirmed on repeat testing.  While 2019 novel coronavirus  (SARS-CoV-2) nucleic acids may be present in the submitted sample  additional confirmatory testing may be necessary for epidemiological  and / or clinical management purposes  to differentiate between  SARS-CoV-2 and other Sarbecovirus currently known to infect humans.  If clinically indicated additional testing with an alternate test  methodology [phone]) is advised. The SARS-CoV-2 RNA is generally  detectable in upper and lower respiratory sp ecimens during the acute  phase of infection. The expected result is Negative. Fact Sheet for Patients:  BoilerBrush.com.cy Fact Sheet for Healthcare Providers: https://pope.com/ This test is not yet approved or cleared by the [place] FDA and has been authorized for detection and/or diagnosis of SARS-CoV-2 by FDA under an Emergency Use Authorization (EUA).  This EUA will remain in effect (meaning this test can be used) for the duration of the COVID-19 declaration under Section 564(b)(1) of the Act, 21 U.S.C. section 360bbb-3(b)(1), unless the authorization is  terminated or revoked sooner. Performed at [facility], [address] [place], [place] [address]   MRSA PCR Screening     Status: None   Collection Time: 09/22/19  9:04 PM   Specimen: Nasal Mucosa; Nasopharyngeal  Result Value Ref Range Status   MRSA by PCR NEGATIVE NEGATIVE Final    Comment:        The GeneXpert MRSA Assay (FDA approved for NASAL specimens only), is one component of a comprehensive MRSA colonization surveillance program. It is not intended to diagnose MRSA infection nor to guide or monitor treatment for MRSA infections. Performed at [facility], [address] [place], [place] [address]   Culture, blood (routine x 2) Call MD if unable to obtain prior to antibiotics being given     Status: None (Preliminary result)   Collection Time: 09/22/19  9:41 PM   Specimen: BLOOD  Result Value Ref Range Status   Specimen Description BLOOD BLOOD LEFT HAND  Final   Special Requests   Final    BOTTLES DRAWN AEROBIC AND ANAEROBIC Blood Culture adequate volume   Culture   Final    NO GROWTH 4 DAYS Performed at [facility], [address], [place], [place] [address]    Report Status PENDING  Incomplete  Culture, blood (routine x 2) Call MD if unable to obtain prior to antibiotics being given     Status: None (Preliminary result)   Collection Time: 09/22/19  9:47 PM   Specimen: BLOOD  Result Value Ref Range Status   Specimen Description BLOOD BLOOD LEFT WRIST  Final   Special Requests   Final    BOTTLES DRAWN AEROBIC ONLY Blood Culture results may not be optimal due to an inadequate volume of blood received in culture bottles   Culture  Final    NO GROWTH 4 DAYS Performed at [facility], [address] [place], [place] [address]    Report Status PENDING  Incomplete  SARS CORONAVIRUS 2 (TAT 6-24 HRS) Nasopharyngeal Nasopharyngeal Swab     Status: None   Collection Time: 09/26/19  5:06 PM   Specimen: Nasopharyngeal Swab   Result Value Ref Range Status   SARS Coronavirus 2 NEGATIVE NEGATIVE Final    Comment: (NOTE) SARS-CoV-2 target nucleic acids are NOT DETECTED. The SARS-CoV-2 RNA is generally detectable in upper and lower respiratory specimens during the acute phase of infection. Negative results do not preclude SARS-CoV-2 infection, do not rule out co-infections with other pathogens, and should not be used as the sole basis for treatment or other patient management decisions. Negative results must be combined with clinical observations, patient history, and epidemiological information. The expected result is Negative. Fact Sheet for Patients: HairSlick.nohttps://www.fda.gov/media/138098/download Fact Sheet for Healthcare Providers: quierodirigir.comhttps://www.fda.gov/media/138095/download This test is not yet approved or cleared by the [place] FDA and  has been authorized for detection and/or diagnosis of SARS-CoV-2 by FDA under an Emergency Use Authorization (EUA). This EUA will remain  in effect (meaning this test can be used) for the duration of the COVID-19 declaration under Section 56 4(b)(1) of the Act, 21 U.S.C. section 360bbb-3(b)(1), unless the authorization is terminated or revoked sooner. Performed at [facility], [address] [place], [place] [address]      Time coordinating discharge: Over 30 minutes  SIGNED:   Pennie BanterKelly A Greene Diodato, DO [facility] 09/27/2019, 1:59 PM Pager [phone]  If 7PM-7AM, please contact night-coverage [email or web address] Password [email or web address]

## 2019-09-27 NOTE — Progress Notes (Signed)
PROGRESS NOTE    Tami Skinner  ZOX:[id number] DOB: 09-23-1946 DOA: 09/22/2019  PCP: Jearld Fenton, NP    LOS - 5   Brief Narrative:  Pt. with PMH of COPD, HTN, HLD, OSA, type II DM; presented with complain of cough and shortness of breath, was found to have acute COPD exacerbation with acute on chronic hypoxic respiratory failure and acute bronchitis.  She was initially on BiPAP.  Covid was negative.  She was admitted, started on steroid and nebulizers, as well as Rocephin/Azithro for any component of PNA possibly contributing.  Oxygen was weaned down to 2 L/min.  Patient at baseline requires 2-3 L/min.  Patient was evaluated by PT, recommended SNF for rehab.      Subjective 11/23: Patient seen sitting up in bed.  No acute events overnight.  She reports breathing feels at baseline.  No fever/chills, chest pain, or other complaints.  Assessment & Plan:   Active Problems:   Acute respiratory failure with hypoxia and hypercapnia (HCC)   Acute on chronic respiratory failure with hypoxia and hypercapnia (HCC)   1. Acute hypoxic and hypercarbic respiratory failure secondary to severe COPD exacerbation. Chest x-ray unremarkable. Tachypneic, tachycardic on admission. Required BiPAP in the emergency department as She progressed. SARS Covid test negative. Continue oral steroids. Continue duo nebs. Transition to doxycycline. Monitor cultures. Off of BiPAP. Appreciate CCM input.  2. Acute bronchitis, likely the culprit for her COPD exacerbation.Management as above.  3.Hypertensive urgency.We will continue Toprol-XLand place the patient on as needed IV labetalol.  4. Type 2 diabetes mellitus.The patient will be placed on supplement coverage with NovoLog.  5. Dyslipidemia.We will continue statin therapy.  6. GERD.PPI therapy will be resumed especially given current steroid therapy.  7.  Acute metabolic encephalopathy. Likely in the setting of hypoxia. CT  scan of the head unremarkable. Metabolic work-up so far also unremarkable. Continue to monitor closely. We will provide some B1 and B12.   DVT prophylaxis: heparin   Code Status: DNR  Family Communication: none at bedside  Disposition Plan:  D/C to rehab vs home with [facility], SW following.  Stable for d/c.   Consultants:   PCCM  Palliative  Procedures: None  Antimicrobials:   Rocephin, Azithromycin 11/19-11/20  Doxycycline 11/20-11/24    Objective: Vitals:   09/26/19 2133 09/27/19 0211 09/27/19 0359 09/27/19 0818  BP: 116/76  134/67 114/65  Pulse: 80  86 81  Resp: (!) 21  18 18   Temp: 98.4 F (36.9 C)  98.5 F (36.9 C) 98.4 F (36.9 C)  TempSrc: Oral  Oral Oral  SpO2: 96% 95% 95% 96%  Weight:   66.4 kg   Height:        Intake/Output Summary (Last 24 hours) at 09/27/2019 1131 Last data filed at 09/27/2019 1029 Gross per 24 hour  Intake 120 ml  Output 375 ml  Net -255 ml   Filed Weights   09/25/19 0510 09/26/19 0404 09/27/19 0359  Weight: 68.1 kg 67.7 kg 66.4 kg    Examination:  General exam: awake, alert, no acute distress Respiratory system: decreased breath sounds, expiratory wheezes, no rales or rhonchi, on 2 L/min O2. Cardiovascular system: normal S1/S2, RRR, no JVD, murmurs, rubs, gallops, no pedal edema.   Gastrointestinal system: soft, non-tender, non-distended abdomen. Central nervous system: alert and oriented x4. no gross focal neurologic deficits, normal speech Extremities: moves all, no edema, normal tone Psychiatry: normal mood, congruent affect, judgement and insight appear normal    Data Reviewed: I  have personally reviewed following labs and imaging studies  CBC: Recent Labs  Lab 09/22/19 1432 09/23/19 0430 09/26/19 0527  WBC 7.8 6.8 7.8  NEUTROABS  --   --  5.1  HGB 14.5 11.3* 13.6  HCT 47.5* 36.8 43.0  MCV 95.4 90.4 91.1  PLT 257 188 207   Basic Metabolic Panel: Recent Labs  Lab 09/22/19 1432 09/23/19 0430 09/26/19  0527  NA 140 140 138  K 4.1 4.0 4.9  CL 90* 90* 94*  CO2 37* 36* 32  GLUCOSE 139* 130* 117*  BUN 20 16 25*  CREATININE 0.77 0.79 0.90  CALCIUM 10.2 9.1 9.9  MG  --   --  2.1   GFR: Estimated Creatinine Clearance: 51.4 mL/min (by C-G formula based on SCr of 0.9 mg/dL). Liver Function Tests: Recent Labs  Lab 09/22/19 1432 09/26/19 0527  AST 26 18  ALT 23 15  ALKPHOS 68 47  BILITOT 1.0 0.8  PROT 8.4* 6.5  ALBUMIN 5.0 3.9   No results for input(s): LIPASE, AMYLASE in the last 168 hours. Recent Labs  Lab 09/23/19 0856  AMMONIA 20   Coagulation Profile: No results for input(s): INR, PROTIME in the last 168 hours. Cardiac Enzymes: No results for input(s): CKTOTAL, CKMB, CKMBINDEX, TROPONINI in the last 168 hours. BNP (last 3 results) No results for input(s): PROBNP in the last 8760 hours. HbA1C: No results for input(s): HGBA1C in the last 72 hours. CBG: Recent Labs  Lab 09/26/19 1144 09/26/19 1630 09/26/19 2130 09/27/19 0756 09/27/19 1127  GLUCAP 144* 129* 157* 111* 138*   Lipid Profile: No results for input(s): CHOL, HDL, LDLCALC, TRIG, CHOLHDL, LDLDIRECT in the last 72 hours. Thyroid Function Tests: No results for input(s): TSH, T4TOTAL, FREET4, T3FREE, THYROIDAB in the last 72 hours. Anemia Panel: No results for input(s): VITAMINB12, FOLATE, FERRITIN, TIBC, IRON, RETICCTPCT in the last 72 hours. Sepsis Labs: Recent Labs  Lab 09/22/19 2143 09/23/19 0430 09/24/19 0523  PROCALCITON <0.10 <0.10 <0.10    Recent Results (from the past 240 hour(s))  SARS Coronavirus 2 by RT PCR (hospital order, performed in [facility] hospital lab) Nasopharyngeal Nasopharyngeal Swab     Status: None   Collection Time: 09/22/19  6:06 PM   Specimen: Nasopharyngeal Swab  Result Value Ref Range Status   SARS Coronavirus 2 NEGATIVE NEGATIVE Final    Comment: (NOTE) If result is NEGATIVE SARS-CoV-2 target nucleic acids are NOT DETECTED. The SARS-CoV-2 RNA is generally  detectable in upper and lower  respiratory specimens during the acute phase of infection. The lowest  concentration of SARS-CoV-2 viral copies this assay can detect is 250  copies / mL. A negative result does not preclude SARS-CoV-2 infection  and should not be used as the sole basis for treatment or other  patient management decisions.  A negative result may occur with  improper specimen collection / handling, submission of specimen other  than nasopharyngeal swab, presence of viral mutation(s) within the  areas targeted by this assay, and inadequate number of viral copies  (<250 copies / mL). A negative result must be combined with clinical  observations, patient history, and epidemiological information. If result is POSITIVE SARS-CoV-2 target nucleic acids are DETECTED. The SARS-CoV-2 RNA is generally detectable in upper and lower  respiratory specimens dur ing the acute phase of infection.  Positive  results are indicative of active infection with SARS-CoV-2.  Clinical  correlation with patient history and other diagnostic information is  necessary to determine patient infection status.  Positive results do  not rule out bacterial infection or co-infection with other viruses. If result is PRESUMPTIVE POSTIVE SARS-CoV-2 nucleic acids MAY BE PRESENT.   A presumptive positive result was obtained on the submitted specimen  and confirmed on repeat testing.  While 2019 novel coronavirus  (SARS-CoV-2) nucleic acids may be present in the submitted sample  additional confirmatory testing may be necessary for epidemiological  and / or clinical management purposes  to differentiate between  SARS-CoV-2 and other Sarbecovirus currently known to infect humans.  If clinically indicated additional testing with an alternate test  methodology [phone]([id number]) is advised. The SARS-CoV-2 RNA is generally  detectable in upper and lower respiratory sp ecimens during the acute  phase of infection. The  expected result is Negative. Fact Sheet for Patients:  BoilerBrush.com.cyhttps://www.fda.gov/media/136312/download Fact Sheet for Healthcare Providers: https://pope.com/https://www.fda.gov/media/136313/download This test is not yet approved or cleared by the [place] FDA and has been authorized for detection and/or diagnosis of SARS-CoV-2 by FDA under an Emergency Use Authorization (EUA).  This EUA will remain in effect (meaning this test can be used) for the duration of the COVID-19 declaration under Section 564(b)(1) of the Act, 21 U.S.C. section 360bbb-3(b)(1), unless the authorization is terminated or revoked sooner. Performed at [facility], [address] [place], [place] [address]   MRSA PCR Screening     Status: None   Collection Time: 09/22/19  9:04 PM   Specimen: Nasal Mucosa; Nasopharyngeal  Result Value Ref Range Status   MRSA by PCR NEGATIVE NEGATIVE Final    Comment:        The GeneXpert MRSA Assay (FDA approved for NASAL specimens only), is one component of a comprehensive MRSA colonization surveillance program. It is not intended to diagnose MRSA infection nor to guide or monitor treatment for MRSA infections. Performed at [facility], [address] [place], [place] [address]   Culture, blood (routine x 2) Call MD if unable to obtain prior to antibiotics being given     Status: None (Preliminary result)   Collection Time: 09/22/19  9:41 PM   Specimen: BLOOD  Result Value Ref Range Status   Specimen Description BLOOD BLOOD LEFT HAND  Final   Special Requests   Final    BOTTLES DRAWN AEROBIC AND ANAEROBIC Blood Culture adequate volume   Culture   Final    NO GROWTH 4 DAYS Performed at [facility], [address] [place], [place] [address]    Report Status PENDING  Incomplete  Culture, blood (routine x 2) Call MD if unable to obtain prior to antibiotics being given     Status: None (Preliminary result)   Collection Time: 09/22/19  9:47 PM   Specimen:  BLOOD  Result Value Ref Range Status   Specimen Description BLOOD BLOOD LEFT WRIST  Final   Special Requests   Final    BOTTLES DRAWN AEROBIC ONLY Blood Culture results may not be optimal due to an inadequate volume of blood received in culture bottles   Culture   Final    NO GROWTH 4 DAYS Performed at [facility], [address] [place], [place] [address]    Report Status PENDING  Incomplete  SARS CORONAVIRUS 2 (TAT 6-24 HRS) Nasopharyngeal Nasopharyngeal Swab     Status: None   Collection Time: 09/26/19  5:06 PM   Specimen: Nasopharyngeal Swab  Result Value Ref Range Status   SARS Coronavirus 2 NEGATIVE NEGATIVE Final    Comment: (NOTE) SARS-CoV-2 target nucleic acids  are NOT DETECTED. The SARS-CoV-2 RNA is generally detectable in upper and lower respiratory specimens during the acute phase of infection. Negative results do not preclude SARS-CoV-2 infection, do not rule out co-infections with other pathogens, and should not be used as the sole basis for treatment or other patient management decisions. Negative results must be combined with clinical observations, patient history, and epidemiological information. The expected result is Negative. Fact Sheet for Patients: HairSlick.no Fact Sheet for Healthcare Providers: quierodirigir.com This test is not yet approved or cleared by the [place] FDA and  has been authorized for detection and/or diagnosis of SARS-CoV-2 by FDA under an Emergency Use Authorization (EUA). This EUA will remain  in effect (meaning this test can be used) for the duration of the COVID-19 declaration under Section 56 4(b)(1) of the Act, 21 U.S.C. section 360bbb-3(b)(1), unless the authorization is terminated or revoked sooner. Performed at [facility], [address], [place], [place] [address]          Radiology Studies: No results found.      Scheduled Meds: .  B-complex with vitamin C  1 tablet Oral Daily  . budesonide (PULMICORT) nebulizer solution  0.5 mg Nebulization BID  . busPIRone  10 mg Oral TID  . cholecalciferol  2,000 Units Oral Daily  . doxycycline  100 mg Oral Q12H  . enoxaparin (LOVENOX) injection  40 mg Subcutaneous Q24H  . feeding supplement (ENSURE ENLIVE)  237 mL Oral BID BM  . guaiFENesin  600 mg Oral BID  . insulin aspart  0-15 Units Subcutaneous TID WC  . ipratropium-albuterol  3 mL Nebulization TID  . mouth rinse  15 mL Mouth Rinse BID  . metoprolol succinate  50 mg Oral Daily  . oxybutynin  5 mg Oral QHS  . pantoprazole  40 mg Oral Daily  . predniSONE  20 mg Oral Q breakfast  . rosuvastatin  5 mg Oral Daily  . sertraline  200 mg Oral BID   Continuous Infusions:   LOS: 5 days    Time spent: 30-35 minutes    Pennie Banter, DO [facility] Pager: [phone]  If 7PM-7AM, please contact night-coverage [email or web address] Password [email or web address] 09/27/2019, 11:31 AM

## 2019-09-27 NOTE — TOC Transition Note (Addendum)
Transition of Care [facility]) - CM/SW Discharge Note   Patient Details  Name: Tami Skinner MRN: [id number] Date of Birth: Jul 19, 1946  [facility] CM/SW Contact:  Candie Chroman, LCSW Phone Number: 09/27/2019, 11:52 AM   Clinical Narrative: Patient has orders to discharge home today. [facility] representative is aware. Patient has a rollator and bedside commode at home. Patient agreeable to outpatient palliative. Made referral to Flo Shanks, RN with [facility]. No further concerns. CSW signing off.    Final next level of care: Home w Home Health Services Barriers to Discharge: Barriers Resolved   Patient Goals and CMS Choice   CMS Medicare.gov Compare Post Acute Care list provided to:: Patient Choice offered to / list presented to : NA  Discharge Placement                Patient to be transferred to facility by: Husband will pick her up.   Patient and family notified of of transfer: 09/27/19  Discharge Plan and Services     Post Acute Care Choice: [place]          DME Arranged: N/A         HH Arranged: RN, PT, OT, Nurse's Aide, Social Work [employer] Agency: [facility] [place] Date [place] Agency Contacted: 09/27/19   Representative spoke with at [place]: [place] (SDOH) Interventions     Readmission Risk Interventions No flowsheet data found.

## 2019-09-27 NOTE — TOC Progression Note (Addendum)
Transition of Care [facility]) - Progression Note    Patient Details  Name: Tami Skinner MRN: [id number] Date of Birth: Jul 31, 1946  [facility] CM/SW [place], LCSW Phone Number: 09/27/2019, 10:52 AM  Clinical Narrative: Patient said she spoke with her husband last night and they would prefer to return home with home health. Patient said she last worked with [facility] and they completed services about a month ago. She is agreeable to restarting services with them. [facility] representative is checking her information.    11:13 am: Patient is already active with [facility]. They can accommodate PT, OT, RN, aide, SW. MD is aware and will enter orders.  Expected Discharge Plan: Skilled Nursing Facility Barriers to Discharge: [place] (PASRR), Insurance Authorization  Expected Discharge Plan and Services Expected Discharge Plan: [place] Choice: [place] arrangements for the past 2 months: Single Family Home                                       Social Determinants of Health (SDOH) Interventions    Readmission Risk Interventions No flowsheet data found.

## 2019-09-28 ENCOUNTER — Telehealth: Payer: Self-pay

## 2019-09-28 NOTE — Telephone Encounter (Signed)
Please schedule overdue 6 mo F/U with Dr. Arida. Thank you! 

## 2019-09-28 NOTE — Telephone Encounter (Signed)
Transition Care Management Follow-up Telephone Call  Date of discharge and from where: 09/27/2019, [facility]  How have you been since you were released from the hospital? Patient states that she is still having trouble with shortness of breath but otherwise feels fine.   Any questions or concerns? No   Items Reviewed:  Did the pt receive and understand the discharge instructions provided? Yes   Medications obtained and verified? Yes   Any new allergies since your discharge? No   Dietary orders reviewed? Yes  Do you have support at home? Yes   Functional Questionnaire: (I = Independent and D = Dependent) ADLs: I  Bathing/Dressing- D  Meal Prep- D  Eating- I  Maintaining continence- I  Transferring/Ambulation- I  Managing Meds- I  Follow up appointments reviewed:   PCP Hospital f/u appt confirmed? Yes  Scheduled to see Webb Silversmith, NP on 10/06/2019 @ 2:15 pm.  [place] [facility] f/u appt confirmed? No    Are transportation arrangements needed? No   If their condition worsens, is the pt aware to call PCP or go to the Emergency Dept.? Yes  Was the patient provided with contact information for the PCP's office or ED? Yes  Was to pt encouraged to call back with questions or concerns? Yes

## 2019-10-05 ENCOUNTER — Telehealth: Payer: Self-pay

## 2019-10-05 NOTE — Telephone Encounter (Signed)
Will Schaller OT with [facility] left v/m requesting verbal orders for [facility] OT 2 x a wk for 2 wks and 1 x a wk for 2 wks.

## 2019-10-06 ENCOUNTER — Encounter: Payer: Self-pay | Admitting: Internal Medicine

## 2019-10-06 ENCOUNTER — Inpatient Hospital Stay: Payer: Self-pay | Admitting: Internal Medicine

## 2019-10-06 ENCOUNTER — Other Ambulatory Visit: Payer: Self-pay

## 2019-10-06 ENCOUNTER — Ambulatory Visit (INDEPENDENT_AMBULATORY_CARE_PROVIDER_SITE_OTHER): Payer: Medicare Other | Admitting: Internal Medicine

## 2019-10-06 VITALS — BP 116/74 | HR 79 | Temp 97.7°F | Wt 155.0 lb

## 2019-10-06 DIAGNOSIS — J440 Chronic obstructive pulmonary disease with acute lower respiratory infection: Secondary | ICD-10-CM

## 2019-10-06 DIAGNOSIS — J209 Acute bronchitis, unspecified: Secondary | ICD-10-CM | POA: Diagnosis not present

## 2019-10-06 DIAGNOSIS — J9622 Acute and chronic respiratory failure with hypercapnia: Secondary | ICD-10-CM

## 2019-10-06 DIAGNOSIS — J9621 Acute and chronic respiratory failure with hypoxia: Secondary | ICD-10-CM | POA: Diagnosis not present

## 2019-10-06 NOTE — Telephone Encounter (Signed)
Ok for HH OT orders as requested 

## 2019-10-06 NOTE — Progress Notes (Signed)
Subjective:    Patient ID: Tami Skinner, female    DOB: 1946-01-16, 73 y.o.   MRN: [id number]  HPI  Pt presents to the clinic today for [facility] Follow Up. She went to the ER 09/22/19 with c/o cough and SOB. Chest xray was negative. Covid test was negative. 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 She was treated with steroids, nebulizers and IV abx. She was initially placed on Bipap and eventually weaned down to [place] 2-3 L which is her baseline. She was transitioned to oral steroids, Doxycycline in addition the her home neb treatments. She had some acute encephalopathy during admission but CT head was unremarkable. It was recommended she go to SNF for rehab but she declined this. She was set up with [facility] nursing, PT/OT. She reports improvement in stamina, strength, appetite and breathing, She has a private aid that helps her with medication management.  Review of Systems      Past Medical History:  Diagnosis Date  . Anxiety   . Asthmatic bronchitis   . COPD (chronic obstructive pulmonary disease) (HCC)   . Depression   . Diabetes mellitus   . Diverticulitis   . Diverticulosis   . Hyperlipidemia   . Hypertension   . Osteopenia   . Sleep apnea   . Stress-induced cardiomyopathy    a. echo 09/28/2014: EF 45-50%, severe HK of mid-distal anterior, apical and mid-distal inferior walls suggestive of stress induced CM, mildly dilated LA, mildly dilated PASP, mild TR, trivial pericardial effusion    Current Outpatient Medications  Medication Sig Dispense Refill  . albuterol (PROVENTIL HFA;VENTOLIN HFA) 108 (90 Base) MCG/ACT inhaler INHALE 2 PUFFS INTO THE LUNGS EVERY 4 (FOUR) HOURS AS NEEDED FOR WHEEZING OR SHORTNESS OF BREATH. 18 Inhaler 6  . albuterol (PROVENTIL) (2.5 MG/3ML) 0.083% nebulizer solution Take 3 mLs (2.5 mg total) by nebulization every 6 (six) hours as needed for wheezing or shortness of breath.  J44.9 75 mL 12  . ALPRAZolam (XANAX) 0.5 MG tablet Take 1 tablet (0.5 mg total) by mouth 3 (three) times daily as needed for anxiety. 90 tablet 0  . B Complex-C (B-COMPLEX WITH VITAMIN C) tablet Take 1 tablet by mouth daily. 30 tablet 0  . buPROPion (WELLBUTRIN XL) 150 MG 24 hr tablet TAKE 1 TABLET BY MOUTH EVERY DAY 90 tablet 0  . busPIRone (BUSPAR) 10 MG tablet Take 1 tablet (10 mg total) by mouth 3 (three) times daily. 270 tablet 0  . Cholecalciferol (VITAMIN D-3) 1000 UNITS CAPS Take 2,000 Units by mouth daily.     Marland Kitchen conjugated estrogens (PREMARIN) vaginal cream Apply 0.5g (pea-sized amount) just inside the vaginal introitus with a finger-tip on Monday,Wednesday and Friday night 30 g 12  . feeding supplement, ENSURE ENLIVE, (ENSURE ENLIVE) LIQD Take 237 mLs by mouth 2 (two) times daily between meals. 237 mL 12  . furosemide (LASIX) 20 MG tablet Take 1 tablet (20 mg total) by mouth as needed for edema. 30 tablet 5  . guaiFENesin (MUCINEX) 600 MG 12 hr tablet Take 1 tablet (600 mg total) by mouth 2 (two) times daily. 30 tablet 0  . metoprolol succinate (TOPROL-XL) 50 MG 24 hr tablet TAKE 1 TABLET (50 MG TOTAL) BY MOUTH DAILY. TAKE WITH OR IMMEDIATELY FOLLOWING A MEAL. 90 tablet 0  . nystatin (MYCOSTATIN/NYSTOP) powder Apply topically 3 (three) times daily. 60 g 5  . omeprazole (PRILOSEC) 40 MG capsule TAKE 1 CAPSULE BY  MOUTH EVERY DAY 90 capsule 0  . ondansetron (ZOFRAN ODT) 8 MG disintegrating tablet Take 1 tablet (8 mg total) by mouth every 8 (eight) hours as needed for nausea or vomiting. 30 tablet 2  . ONE TOUCH ULTRA TEST test strip 1 EACH BY OTHER ROUTE DAILY AS NEEDED FOR OTHER. 25 each 5  . oxybutynin (DITROPAN-XL) 5 MG 24 hr tablet TAKE 1 TABLET BY MOUTH EVERYDAY AT BEDTIME (Patient taking differently: Take 5 mg by mouth at bedtime. ) 90 tablet 0  . predniSONE (DELTASONE) 20 MG tablet Take 1 tablet (20 mg total) by mouth daily with breakfast. 2 tablet 0  . rosuvastatin (CRESTOR) 5 MG  tablet TAKE 1 TABLET BY MOUTH EVERY DAY (Patient taking differently: Take 5 mg by mouth daily. ) 90 tablet 0  . sertraline (ZOLOFT) 100 MG tablet TAKE 2 TABLETS BY MOUTH TWICE A DAY 360 tablet 0  . tiotropium (SPIRIVA HANDIHALER) 18 MCG inhalation capsule Place 1 capsule (18 mcg total) into inhaler and inhale daily. 30 capsule 12  . WIXELA INHUB 250-50 MCG/DOSE AEPB 1 puff 2 (two) times daily.     No current facility-administered medications for this visit.     Allergies  Allergen Reactions  . Amoxicillin-Pot Clavulanate     REACTION: stomach sensitive.  Lenice Llamas [Roflumilast]     Severe nausea   . Erythromycin Base     REACTION: GI upset  . Neurontin [Gabapentin]     anxiety    Family History  Problem Relation Age of Onset  . Diabetes Daughter   . Kidney disease Daughter   . Lung cancer Mother        smoked  . COPD Mother        smoked  . COPD Father        smoked  . Prostate cancer Father   . Kidney failure Father   . Breast cancer Maternal Aunt   . Diabetes Maternal Grandmother   . Breast cancer Maternal Grandmother   . Cancer Cousin        colon    Social History   Socioeconomic History  . Marital status: Married    Spouse name: Not on file  . Number of children: Not on file  . Years of education: Not on file  . Highest education level: Not on file  Occupational History  . Not on file  Social Needs  . Financial resource strain: Not on file  . Food insecurity    Worry: Not on file    Inability: Not on file  . Transportation needs    Medical: Not on file    Non-medical: Not on file  Tobacco Use  . Smoking status: Former Smoker    Packs/day: 0.25    Years: 40.00    Pack years: 10.00    Types: Cigarettes    Quit date: 09/01/2016    Years since quitting: 3.0  . Smokeless tobacco: Never Used  . Tobacco comment: Uses nicotrol inhaler on occasion  Substance and Sexual Activity  . Alcohol use: No  . Drug use: No  . Sexual activity: Not Currently   Lifestyle  . Physical activity    Days per week: Not on file    Minutes per session: Not on file  . Stress: Not on file  Relationships  . Social Musician on phone: Not on file    Gets together: Not on file    Attends religious service: Not on file  Active member of club or organization: Not on file    Attends meetings of clubs or organizations: Not on file    Relationship status: Not on file  . Intimate partner violence    Fear of current or ex partner: Not on file    Emotionally abused: Not on file    Physically abused: Not on file    Forced sexual activity: Not on file  Other Topics Concern  . Not on file  Social History Narrative   Lives with husband in [place]. 2 dogs. Works at [employer].     Constitutional: Pt reports fatigue. Denies fever, malaise, headache or abrupt weight changes.  Respiratory: Pt reports intermittent cough, chronic SOB. Denies difficulty breathing, or sputum production.   Cardiovascular: Denies chest pain, chest tightness, palpitations or swelling in the hands or feet.  Gastrointestinal: Pt reports decreased appetite. Denies abdominal pain, bloating, constipation, diarrhea or blood in the stool.  Musculoskeletal: Pt reports generalized weakness. Denies decrease in range of motion, difficulty with gait, muscle pain or joint pain and swelling.  Neurological: Pt reports difficulty with memory, trouble with balance. Denies dizziness, difficulty with speech or problems with coordination.  Psych: Pt has a history of anxiety. Denies depression, SI/HI.  No other specific complaints in a complete review of systems (except as listed in HPI above).  Objective:   Physical Exam  BP 116/74   Pulse 79   Temp 97.7 F (36.5 C) (Temporal)   Wt 155 lb (70.3 kg)   SpO2 98%   BMI 26.61 kg/m   Wt Readings from Last 3 Encounters:  09/27/19 146 lb 6.4 oz (66.4 kg)  09/22/19 156 lb (70.8 kg)  09/19/19 156 lb (70.8 kg)    General: Appears her stated age,  chronically ill appearing, in NAD. Skin: Warm, dry and intact. Color has improved, not as pale. Neck:  Neck supple, trachea midline. No masses, lumps or thyromegaly present.  Cardiovascular: Normal rate and rhythm. S1,S2 noted.  No murmur, rubs or gallops noted. N Pulmonary/Chest: Increased effort and diminished vesicular breath sounds with bilateral expiratory wheezing noted. No respiratory distress. No rales or ronchi noted.  Abdomen: Soft and nontender. Normal bowel sounds.  Musculoskeletal: Gait slow and steady with use of rolling walker.  Neurological: Alert and oriented. Intermittently confused. Psychiatric: Mood and affect normal.   BMET    Component Value Date/Time   NA 138 09/26/2019 0527   NA 140 09/28/2014 0637   K 4.9 09/26/2019 0527   K 4.3 09/28/2014 0637   CL 94 (L) 09/26/2019 0527   CL 108 (H) 09/28/2014 0637   CO2 32 09/26/2019 0527   CO2 26 09/28/2014 0637   GLUCOSE 117 (H) 09/26/2019 0527   GLUCOSE 117 (H) 09/28/2014 0637   BUN 25 (H) 09/26/2019 0527   BUN 13 09/28/2014 0637   CREATININE 0.90 09/26/2019 0527   CREATININE 1.17 (H) 12/31/2018 1625   CALCIUM 9.9 09/26/2019 0527   CALCIUM 7.7 (L) 09/28/2014 0637   GFRNONAA >60 09/26/2019 0527   GFRNONAA >60 09/28/2014 0637   GFRAA >60 09/26/2019 0527   GFRAA >60 09/28/2014 0637    Lipid Panel     Component Value Date/Time   CHOL 162 12/31/2018 1625   CHOL 110 09/28/2014 0637   TRIG 94 12/31/2018 1625   TRIG 102 09/28/2014 0637   HDL 77 12/31/2018 1625   HDL 54 09/28/2014 0637   CHOLHDL 2.1 12/31/2018 1625   VLDL 11.8 01/20/2017 1407   VLDL 20 09/28/2014 9326  LDLCALC 67 12/31/2018 1625   LDLCALC 36 09/28/2014 0637    CBC    Component Value Date/Time   WBC 7.8 09/26/2019 0527   RBC 4.72 09/26/2019 0527   HGB 13.6 09/26/2019 0527   HGB 13.0 09/30/2014 0525   HCT 43.0 09/26/2019 0527   HCT 39.0 09/30/2014 0525   PLT 207 09/26/2019 0527   PLT 234 09/30/2014 0525   MCV 91.1 09/26/2019 0527    MCV 93 09/30/2014 0525   MCH 28.8 09/26/2019 0527   MCHC 31.6 09/26/2019 0527   RDW 13.2 09/26/2019 0527   RDW 14.1 09/30/2014 0525   LYMPHSABS 1.9 09/26/2019 0527   LYMPHSABS 1.3 09/30/2014 0525   MONOABS 0.7 09/26/2019 0527   MONOABS 0.5 09/30/2014 0525   EOSABS 0.1 09/26/2019 0527   EOSABS 0.2 09/30/2014 0525   BASOSABS 0.0 09/26/2019 0527   BASOSABS 0.0 09/30/2014 0525    Hgb A1C Lab Results  Component Value Date   HGBA1C 5.7 (H) 09/22/2019           Assessment & Plan:   [facility]ch[facility], COPD Exacerbation and Acute on Chronic Hypercapnic Respiratory Failure:  Hospital notes, labs and imaging reviewed She has finished her Prednisone and antibiotics Overall doing much better Continue meds as prescribed Encouraged to call Dr. Alveta HeimlichGonazalez to she is she wants her to follow up. Continue HH OT/PT and nursing  Will monitor, return precautions discussed Nicki Reaperegina Crestina Strike, NP

## 2019-10-07 ENCOUNTER — Encounter: Payer: Self-pay | Admitting: Internal Medicine

## 2019-10-07 NOTE — Patient Instructions (Signed)
COPD and Physical Activity °Chronic obstructive pulmonary disease (COPD) is a long-term (chronic) condition that affects the lungs. COPD is a general term that can be used to describe many different lung problems that cause lung swelling (inflammation) and limit airflow, including chronic bronchitis and emphysema. °The main symptom of COPD is shortness of breath, which makes it harder to do even simple tasks. This can also make it harder to exercise and be active. Talk with your health care provider about treatments to help you breathe better and actions you can take to prevent breathing problems during physical activity. °What are the benefits of exercising with COPD? °Exercising regularly is an important part of a healthy lifestyle. You can still exercise and do physical activities even though you have COPD. Exercise and physical activity improve your shortness of breath by increasing blood flow (circulation). This causes your heart to pump more oxygen through your body. Moderate exercise can improve your: °· Oxygen use. °· Energy level. °· Shortness of breath. °· Strength in your breathing muscles. °· Heart health. °· Sleep. °· Self-esteem and feelings of self-worth. °· Depression, stress, and anxiety levels. °Exercise can benefit everyone with COPD. The severity of your disease may affect how hard you can exercise, especially at first, but everyone can benefit. Talk with your health care provider about how much exercise is safe for you, and which activities and exercises are safe for you. °What actions can I take to prevent breathing problems during physical activity? °· Sign up for a pulmonary rehabilitation program. This type of program may include: °? Education about lung diseases. °? Exercise classes that teach you how to exercise and be more active while improving your breathing. This usually involves: °§ Exercise using your lower extremities, such as a stationary bicycle. °§ About 30 minutes of exercise, 2  to 5 times per week, for 6 to 12 weeks °§ Strength training, such as push ups or leg lifts. °? Nutrition education. °? Group classes in which you can talk with others who also have COPD and learn ways to manage stress. °· If you use an oxygen tank, you should use it while you exercise. Work with your health care provider to adjust your oxygen for your physical activity. Your resting flow rate is different from your flow rate during physical activity. °· While you are exercising: °? Take slow breaths. °? Pace yourself and do not try to go too fast. °? Purse your lips while breathing out. Pursing your lips is similar to a kissing or whistling position. °? If doing exercise that uses a quick burst of effort, such as weight lifting: °§ Breathe in before starting the exercise. °§ Breathe out during the hardest part of the exercise (such as raising the weights). °Where to find support °You can find support for exercising with COPD from: °· Your health care provider. °· A pulmonary rehabilitation program. °· Your local health department or community health programs. °· Support groups, online or in-person. Your health care provider may be able to recommend support groups. °Where to find more information °You can find more information about exercising with COPD from: °· American Lung Association: lung.org. °· COPD Foundation: copdfoundation.org. °Contact a health care provider if: °· Your symptoms get worse. °· You have chest pain. °· You have nausea. °· You have a fever. °· You have trouble talking or catching your breath. °· You want to start a new exercise program or a new activity. °Summary °· COPD is a general term that can   be used to describe many different lung problems that cause lung swelling (inflammation) and limit airflow. This includes chronic bronchitis and emphysema. °· Exercise and physical activity improve your shortness of breath by increasing blood flow (circulation). This causes your heart to provide more  oxygen to your body. °· Contact your health care provider before starting any exercise program or new activity. Ask your health care provider what exercises and activities are safe for you. °This information is not intended to replace advice given to you by your health care provider. Make sure you discuss any questions you have with your health care provider. °Document Released: 11/12/2017 Document Revised: 02/09/2019 Document Reviewed: 11/12/2017 °Elsevier Patient Education © 2020 Elsevier Inc. ° °

## 2019-10-10 LAB — CULTURE, BLOOD (ROUTINE X 2)
Culture: NO GROWTH
Culture: NO GROWTH
Special Requests: ADEQUATE

## 2019-10-12 NOTE — Telephone Encounter (Signed)
VO given.

## 2019-10-13 ENCOUNTER — Encounter: Payer: Self-pay | Admitting: Licensed Clinical Social Worker

## 2019-10-13 ENCOUNTER — Ambulatory Visit (INDEPENDENT_AMBULATORY_CARE_PROVIDER_SITE_OTHER): Payer: Medicare Other | Admitting: Licensed Clinical Social Worker

## 2019-10-13 ENCOUNTER — Other Ambulatory Visit: Payer: Self-pay

## 2019-10-13 DIAGNOSIS — F411 Generalized anxiety disorder: Secondary | ICD-10-CM

## 2019-10-13 NOTE — Progress Notes (Signed)
Virtual Visit via Telephone Note  I connected with Tami Skinner on 10/13/19 at  1:30 PM EST by telephone and verified that I am speaking with the correct person using two identifiers.   I discussed the limitations, risks, security and privacy concerns of performing an evaluation and management service by telephone and the availability of in person appointments. I also discussed with the patient that there may be a patient responsible charge related to this service. The patient expressed understanding and agreed to proceed.   I discussed the assessment and treatment plan with the patient. The patient was provided an opportunity to ask questions and all were answered. The patient agreed with the plan and demonstrated an understanding of the instructions.   The patient was advised to call back or seek an in-person evaluation if the symptoms worsen or if the condition fails to improve as anticipated.  I provided 30 minutes of non-face-to-face time during this encounter.   Alden Hipp, LCSW   THERAPIST PROGRESS NOTE  Session Time: 1330  Participation Level: Active  Behavioral Response: NeatAlertAnxious  Type of Therapy: Individual Therapy  Treatment Goals addressed: Anxiety  Interventions: Supportive  Summary: Tami Skinner is a 73 y.o. female who presents with continued symptoms related to her diagnosis. Tami Skinner reports doing well since our last session, but noted she was admitted to the hospital for a COPD related issue. She reported that was very scary, but is feeling better now. LCSW validated Tami Skinner's feelings around the topic and celebrated the fact she is feeling better. Tami Skinner reported the thing that is causing her the most anxiety at the moment is, "the girl that helps me out a few days a week, makes my meals and things like that, she found out her husband was exposed to [place], and he refuses to get tested." Tami Skinner reported the woman is getting tested but won't have her results until  Monday. Tami Skinner reported this is causing her a great deal of anxiety at this time. LCSW validated Tami Skinner feelings and encouraged her to utilize CBT skills to challenge negative thoughts associated with this event. Another skill discussed was utilizing evidence for and against the thought. Lamija expressed understanding and agreement with this information. Tami Skinner added things with her husband are "about the same. We just avoid each other." LCSW validated how isolating that can feel, and how difficult that could be to navigate. Tami Skinner reported she is "used to it." LCSW validated Tami Skinner's feelings around the subject.   Suicidal/Homicidal: No  Therapist Response: Tami Skinner continues to work towards her tx goals but has not yet reached them. We will continue to work on improving distress tolerance and emotional regulation skills moving forward.   Plan: Return again in 4 weeks.  Diagnosis: Axis I: Generalized Anxiety Disorder    Axis II: No diagnosis    Alden Hipp, LCSW 10/13/2019

## 2019-10-20 ENCOUNTER — Other Ambulatory Visit: Payer: Self-pay | Admitting: Internal Medicine

## 2019-10-21 ENCOUNTER — Encounter: Payer: Self-pay | Admitting: Internal Medicine

## 2019-10-21 NOTE — Telephone Encounter (Signed)
Last filled 08/19/2019 #30 with 2 refills... requesting for 90 day supply... please advise

## 2019-10-21 NOTE — Telephone Encounter (Signed)
Last filled 09/19/2019... please advise  

## 2019-10-24 MED ORDER — ALPRAZOLAM 0.5 MG PO TABS: 0.5000 mg | ORAL_TABLET | Freq: Three times a day (TID) | ORAL | 0 refills | Status: DC | PRN

## 2019-10-26 ENCOUNTER — Other Ambulatory Visit: Payer: Self-pay

## 2019-10-26 ENCOUNTER — Encounter: Payer: Self-pay | Admitting: Emergency Medicine

## 2019-10-26 ENCOUNTER — Emergency Department: Payer: Medicare Other

## 2019-10-26 ENCOUNTER — Inpatient Hospital Stay
Admission: EM | Admit: 2019-10-26 | Discharge: 2019-10-29 | DRG: 190 | Disposition: A | Discharge status: Hospice | Payer: Medicare Other | Attending: Internal Medicine | Admitting: Internal Medicine

## 2019-10-26 DIAGNOSIS — Z79899 Other long term (current) drug therapy: Secondary | ICD-10-CM

## 2019-10-26 DIAGNOSIS — F329 Major depressive disorder, single episode, unspecified: Secondary | ICD-10-CM | POA: Diagnosis present

## 2019-10-26 DIAGNOSIS — F410 Panic disorder [episodic paroxysmal anxiety] without agoraphobia: Secondary | ICD-10-CM | POA: Diagnosis present

## 2019-10-26 DIAGNOSIS — I252 Old myocardial infarction: Secondary | ICD-10-CM

## 2019-10-26 DIAGNOSIS — E785 Hyperlipidemia, unspecified: Secondary | ICD-10-CM | POA: Diagnosis present

## 2019-10-26 DIAGNOSIS — F05 Delirium due to known physiological condition: Secondary | ICD-10-CM | POA: Diagnosis not present

## 2019-10-26 DIAGNOSIS — Z801 Family history of malignant neoplasm of trachea, bronchus and lung: Secondary | ICD-10-CM

## 2019-10-26 DIAGNOSIS — R410 Disorientation, unspecified: Secondary | ICD-10-CM | POA: Diagnosis present

## 2019-10-26 DIAGNOSIS — R911 Solitary pulmonary nodule: Secondary | ICD-10-CM | POA: Diagnosis present

## 2019-10-26 DIAGNOSIS — J9621 Acute and chronic respiratory failure with hypoxia: Secondary | ICD-10-CM | POA: Diagnosis present

## 2019-10-26 DIAGNOSIS — E1169 Type 2 diabetes mellitus with other specified complication: Secondary | ICD-10-CM | POA: Diagnosis not present

## 2019-10-26 DIAGNOSIS — Z79890 Hormone replacement therapy (postmenopausal): Secondary | ICD-10-CM | POA: Diagnosis not present

## 2019-10-26 DIAGNOSIS — Z515 Encounter for palliative care: Secondary | ICD-10-CM | POA: Diagnosis not present

## 2019-10-26 DIAGNOSIS — R451 Restlessness and agitation: Secondary | ICD-10-CM | POA: Diagnosis not present

## 2019-10-26 DIAGNOSIS — I5181 Takotsubo syndrome: Secondary | ICD-10-CM | POA: Diagnosis present

## 2019-10-26 DIAGNOSIS — Z20828 Contact with and (suspected) exposure to other viral communicable diseases: Secondary | ICD-10-CM | POA: Diagnosis present

## 2019-10-26 DIAGNOSIS — K219 Gastro-esophageal reflux disease without esophagitis: Secondary | ICD-10-CM | POA: Diagnosis present

## 2019-10-26 DIAGNOSIS — J9601 Acute respiratory failure with hypoxia: Secondary | ICD-10-CM | POA: Diagnosis not present

## 2019-10-26 DIAGNOSIS — Z7952 Long term (current) use of systemic steroids: Secondary | ICD-10-CM

## 2019-10-26 DIAGNOSIS — I5032 Chronic diastolic (congestive) heart failure: Secondary | ICD-10-CM | POA: Diagnosis present

## 2019-10-26 DIAGNOSIS — Z888 Allergy status to other drugs, medicaments and biological substances status: Secondary | ICD-10-CM

## 2019-10-26 DIAGNOSIS — F411 Generalized anxiety disorder: Secondary | ICD-10-CM | POA: Diagnosis present

## 2019-10-26 DIAGNOSIS — G4730 Sleep apnea, unspecified: Secondary | ICD-10-CM | POA: Diagnosis present

## 2019-10-26 DIAGNOSIS — Z833 Family history of diabetes mellitus: Secondary | ICD-10-CM

## 2019-10-26 DIAGNOSIS — I110 Hypertensive heart disease with heart failure: Secondary | ICD-10-CM | POA: Diagnosis present

## 2019-10-26 DIAGNOSIS — E119 Type 2 diabetes mellitus without complications: Secondary | ICD-10-CM | POA: Diagnosis present

## 2019-10-26 DIAGNOSIS — Z66 Do not resuscitate: Secondary | ICD-10-CM | POA: Diagnosis present

## 2019-10-26 DIAGNOSIS — Z825 Family history of asthma and other chronic lower respiratory diseases: Secondary | ICD-10-CM

## 2019-10-26 DIAGNOSIS — J441 Chronic obstructive pulmonary disease with (acute) exacerbation: Principal | ICD-10-CM | POA: Diagnosis present

## 2019-10-26 DIAGNOSIS — Z90710 Acquired absence of both cervix and uterus: Secondary | ICD-10-CM

## 2019-10-26 DIAGNOSIS — I10 Essential (primary) hypertension: Secondary | ICD-10-CM

## 2019-10-26 DIAGNOSIS — Z87891 Personal history of nicotine dependence: Secondary | ICD-10-CM

## 2019-10-26 DIAGNOSIS — Z7189 Other specified counseling: Secondary | ICD-10-CM | POA: Diagnosis not present

## 2019-10-26 DIAGNOSIS — J9622 Acute and chronic respiratory failure with hypercapnia: Secondary | ICD-10-CM | POA: Diagnosis present

## 2019-10-26 DIAGNOSIS — Z881 Allergy status to other antibiotic agents status: Secondary | ICD-10-CM

## 2019-10-26 DIAGNOSIS — G4733 Obstructive sleep apnea (adult) (pediatric): Secondary | ICD-10-CM | POA: Diagnosis present

## 2019-10-26 DIAGNOSIS — F419 Anxiety disorder, unspecified: Secondary | ICD-10-CM | POA: Diagnosis not present

## 2019-10-26 LAB — CBC WITH DIFFERENTIAL/PLATELET
Abs Immature Granulocytes: 0.1 10*3/uL — ABNORMAL HIGH (ref 0.00–0.07)
Basophils Absolute: 0 10*3/uL (ref 0.0–0.1)
Basophils Relative: 1 %
Eosinophils Absolute: 0.3 10*3/uL (ref 0.0–0.5)
Eosinophils Relative: 4 %
HCT: 42.1 % (ref 36.0–46.0)
Hemoglobin: 12.5 g/dL (ref 12.0–15.0)
Immature Granulocytes: 1 %
Lymphocytes Relative: 13 %
Lymphs Abs: 0.9 10*3/uL (ref 0.7–4.0)
MCH: 28.5 pg (ref 26.0–34.0)
MCHC: 29.7 g/dL — ABNORMAL LOW (ref 30.0–36.0)
MCV: 96.1 fL (ref 80.0–100.0)
Monocytes Absolute: 0.5 10*3/uL (ref 0.1–1.0)
Monocytes Relative: 7 %
Neutro Abs: 5.4 10*3/uL (ref 1.7–7.7)
Neutrophils Relative %: 74 %
Platelets: 234 10*3/uL (ref 150–400)
RBC: 4.38 MIL/uL (ref 3.87–5.11)
RDW: 13.1 % (ref 11.5–15.5)
WBC: 7.2 10*3/uL (ref 4.0–10.5)
nRBC: 0 % (ref 0.0–0.2)

## 2019-10-26 LAB — BLOOD GAS, ARTERIAL
Acid-Base Excess: 14 mmol/L — ABNORMAL HIGH (ref 0.0–2.0)
Bicarbonate: 43.7 mmol/L — ABNORMAL HIGH (ref 20.0–28.0)
FIO2: 0.28
O2 Saturation: 91.5 %
Patient temperature: 37
pCO2 arterial: 81 mmHg (ref 32.0–48.0)
pH, Arterial: 7.34 — ABNORMAL LOW (ref 7.350–7.450)
pO2, Arterial: 66 mmHg — ABNORMAL LOW (ref 83.0–108.0)

## 2019-10-26 LAB — MAGNESIUM: Magnesium: 1.8 mg/dL (ref 1.7–2.4)

## 2019-10-26 LAB — URINALYSIS, COMPLETE (UACMP) WITH MICROSCOPIC
Bacteria, UA: NONE SEEN
Bilirubin Urine: NEGATIVE
Glucose, UA: NEGATIVE mg/dL
Hgb urine dipstick: NEGATIVE
Ketones, ur: 20 mg/dL — AB
Leukocytes,Ua: NEGATIVE
Nitrite: NEGATIVE
Protein, ur: 30 mg/dL — AB
Specific Gravity, Urine: 1.021 (ref 1.005–1.030)
pH: 5 (ref 5.0–8.0)

## 2019-10-26 LAB — BLOOD GAS, VENOUS
Acid-Base Excess: 11.5 mmol/L — ABNORMAL HIGH (ref 0.0–2.0)
Bicarbonate: 43.6 mmol/L — ABNORMAL HIGH (ref 20.0–28.0)
O2 Saturation: 98.5 %
Patient temperature: 37
pCO2, Ven: 104 mmHg (ref 44.0–60.0)
pH, Ven: 7.23 — ABNORMAL LOW (ref 7.250–7.430)
pO2, Ven: 131 mmHg — ABNORMAL HIGH (ref 32.0–45.0)

## 2019-10-26 LAB — COMPREHENSIVE METABOLIC PANEL
ALT: 17 U/L (ref 0–44)
AST: 17 U/L (ref 15–41)
Albumin: 4.2 g/dL (ref 3.5–5.0)
Alkaline Phosphatase: 55 U/L (ref 38–126)
Anion gap: 11 (ref 5–15)
BUN: 16 mg/dL (ref 8–23)
CO2: 35 mmol/L — ABNORMAL HIGH (ref 22–32)
Calcium: 9.6 mg/dL (ref 8.9–10.3)
Chloride: 96 mmol/L — ABNORMAL LOW (ref 98–111)
Creatinine, Ser: 0.43 mg/dL — ABNORMAL LOW (ref 0.44–1.00)
GFR calc Af Amer: >60 mL/min (ref >60)
GFR calc non Af Amer: >60 mL/min (ref >60)
Glucose, Bld: 124 mg/dL — ABNORMAL HIGH (ref 70–99)
Potassium: 4.2 mmol/L (ref 3.5–5.1)
Sodium: 142 mmol/L (ref 135–145)
Total Bilirubin: 0.6 mg/dL (ref 0.3–1.2)
Total Protein: 6.9 g/dL (ref 6.5–8.1)

## 2019-10-26 LAB — LACTIC ACID, PLASMA
Lactic Acid, Venous: 0.5 mmol/L (ref 0.5–1.9)
Lactic Acid, Venous: 1.2 mmol/L (ref 0.5–1.9)

## 2019-10-26 LAB — RESPIRATORY PANEL BY RT PCR (FLU A&B, COVID)
Influenza A by PCR: NEGATIVE
Influenza B by PCR: NEGATIVE
SARS Coronavirus 2 by RT PCR: NEGATIVE

## 2019-10-26 LAB — BRAIN NATRIURETIC PEPTIDE: B Natriuretic Peptide: 410 pg/mL — ABNORMAL HIGH (ref 0.0–100.0)

## 2019-10-26 LAB — PROCALCITONIN: Procalcitonin: <0.1 ng/mL

## 2019-10-26 MED ORDER — UMECLIDINIUM-VILANTEROL 62.5-25 MCG/INH IN AEPB
1.0000 | INHALATION_SPRAY | Freq: Every day | RESPIRATORY_TRACT | Status: DC
  Administered 2019-10-27 – 2019-10-29 (×3): 1 via RESPIRATORY_TRACT
  Filled 2019-10-26: qty 14

## 2019-10-26 MED ORDER — PREDNISONE 20 MG PO TABS
40.0000 mg | ORAL_TABLET | Freq: Every day | ORAL | Status: DC
  Administered 2019-10-28 – 2019-10-29 (×2): 40 mg via ORAL
  Filled 2019-10-26 (×2): qty 2

## 2019-10-26 MED ORDER — GUAIFENESIN ER 600 MG PO TB12
600.0000 mg | ORAL_TABLET | Freq: Two times a day (BID) | ORAL | Status: DC
  Administered 2019-10-26 – 2019-10-29 (×6): 600 mg via ORAL
  Filled 2019-10-26 (×6): qty 1

## 2019-10-26 MED ORDER — OXYBUTYNIN CHLORIDE ER 5 MG PO TB24
5.0000 mg | ORAL_TABLET | Freq: Every day | ORAL | Status: DC
  Administered 2019-10-26 – 2019-10-28 (×3): 5 mg via ORAL
  Filled 2019-10-26 (×3): qty 1

## 2019-10-26 MED ORDER — ONDANSETRON 8 MG PO TBDP
8.0000 mg | ORAL_TABLET | Freq: Three times a day (TID) | ORAL | Status: DC | PRN
  Administered 2019-10-28: 8 mg via ORAL
  Filled 2019-10-26 (×3): qty 1

## 2019-10-26 MED ORDER — B COMPLEX-C PO TABS
1.0000 | ORAL_TABLET | Freq: Every day | ORAL | Status: DC
  Administered 2019-10-27 – 2019-10-29 (×3): 1 via ORAL
  Filled 2019-10-26 (×3): qty 1

## 2019-10-26 MED ORDER — ENOXAPARIN SODIUM 40 MG/0.4ML ~~LOC~~ SOLN
40.0000 mg | SUBCUTANEOUS | Status: DC
  Administered 2019-10-26 – 2019-10-28 (×3): 40 mg via SUBCUTANEOUS
  Filled 2019-10-26 (×3): qty 0.4

## 2019-10-26 MED ORDER — ALPRAZOLAM 0.5 MG PO TABS
0.5000 mg | ORAL_TABLET | Freq: Three times a day (TID) | ORAL | Status: DC | PRN
  Administered 2019-10-26 – 2019-10-27 (×3): 0.5 mg via ORAL
  Filled 2019-10-26 (×3): qty 1

## 2019-10-26 MED ORDER — FUROSEMIDE 20 MG PO TABS: 20.0000 mg | ORAL_TABLET | ORAL | Status: DC | PRN

## 2019-10-26 MED ORDER — ENSURE ENLIVE PO LIQD
237.0000 mL | Freq: Two times a day (BID) | ORAL | Status: DC
  Administered 2019-10-28: 237 mL via ORAL

## 2019-10-26 MED ORDER — SERTRALINE HCL 50 MG PO TABS
200.0000 mg | ORAL_TABLET | Freq: Two times a day (BID) | ORAL | Status: DC
  Administered 2019-10-26 – 2019-10-29 (×6): 200 mg via ORAL
  Filled 2019-10-26 (×6): qty 4

## 2019-10-26 MED ORDER — ROSUVASTATIN CALCIUM 5 MG PO TABS
5.0000 mg | ORAL_TABLET | Freq: Every day | ORAL | Status: DC
  Administered 2019-10-26 – 2019-10-29 (×4): 5 mg via ORAL
  Filled 2019-10-26 (×4): qty 1

## 2019-10-26 MED ORDER — BUDESONIDE 0.5 MG/2ML IN SUSP
2.0000 mg | Freq: Four times a day (QID) | RESPIRATORY_TRACT | Status: DC
  Administered 2019-10-26: 0.5 mg via RESPIRATORY_TRACT
  Filled 2019-10-26: qty 8

## 2019-10-26 MED ORDER — MAGNESIUM SULFATE 2 GM/50ML IV SOLN
2.0000 g | INTRAVENOUS | Status: AC
  Administered 2019-10-26: 2 g via INTRAVENOUS
  Filled 2019-10-26: qty 50

## 2019-10-26 MED ORDER — PANTOPRAZOLE SODIUM 40 MG PO TBEC
40.0000 mg | DELAYED_RELEASE_TABLET | Freq: Every day | ORAL | Status: DC
  Administered 2019-10-27 – 2019-10-29 (×3): 40 mg via ORAL
  Filled 2019-10-26 (×3): qty 1

## 2019-10-26 MED ORDER — METHYLPREDNISOLONE SODIUM SUCC 125 MG IJ SOLR
125.0000 mg | Freq: Once | INTRAMUSCULAR | Status: AC
  Administered 2019-10-26: 125 mg via INTRAVENOUS
  Filled 2019-10-26: qty 2

## 2019-10-26 MED ORDER — BUSPIRONE HCL 10 MG PO TABS
10.0000 mg | ORAL_TABLET | Freq: Three times a day (TID) | ORAL | Status: DC
  Administered 2019-10-26 – 2019-10-29 (×8): 10 mg via ORAL
  Filled 2019-10-26 (×8): qty 1

## 2019-10-26 MED ORDER — ALBUTEROL SULFATE (2.5 MG/3ML) 0.083% IN NEBU
2.5000 mg | INHALATION_SOLUTION | RESPIRATORY_TRACT | Status: DC | PRN
  Administered 2019-10-26 – 2019-10-27 (×2): 2.5 mg via RESPIRATORY_TRACT
  Filled 2019-10-26 (×2): qty 3

## 2019-10-26 MED ORDER — ALBUTEROL SULFATE (2.5 MG/3ML) 0.083% IN NEBU
5.0000 mg | INHALATION_SOLUTION | Freq: Once | RESPIRATORY_TRACT | Status: AC
  Administered 2019-10-26: 5 mg via RESPIRATORY_TRACT
  Filled 2019-10-26: qty 6

## 2019-10-26 MED ORDER — DOXYCYCLINE HYCLATE 100 MG PO TABS
100.0000 mg | ORAL_TABLET | Freq: Two times a day (BID) | ORAL | Status: DC
  Administered 2019-10-26 – 2019-10-27 (×2): 100 mg via ORAL
  Filled 2019-10-26 (×2): qty 1

## 2019-10-26 MED ORDER — IPRATROPIUM-ALBUTEROL 0.5-2.5 (3) MG/3ML IN SOLN
3.0000 mL | Freq: Once | RESPIRATORY_TRACT | Status: AC
  Administered 2019-10-26: 3 mL via RESPIRATORY_TRACT
  Filled 2019-10-26: qty 3

## 2019-10-26 MED ORDER — BUDESONIDE 0.5 MG/2ML IN SUSP
0.5000 mg | Freq: Two times a day (BID) | RESPIRATORY_TRACT | Status: DC
  Filled 2019-10-26: qty 2

## 2019-10-26 MED ORDER — BUPROPION HCL ER (XL) 150 MG PO TB24
150.0000 mg | ORAL_TABLET | Freq: Every day | ORAL | Status: DC
  Administered 2019-10-27 – 2019-10-28 (×2): 150 mg via ORAL
  Filled 2019-10-26 (×2): qty 1

## 2019-10-26 MED ORDER — VITAMIN D 25 MCG (1000 UNIT) PO TABS
2000.0000 [IU] | ORAL_TABLET | Freq: Every day | ORAL | Status: DC
  Administered 2019-10-27 – 2019-10-29 (×3): 2000 [IU] via ORAL
  Filled 2019-10-26 (×3): qty 2

## 2019-10-26 MED ORDER — METHYLPREDNISOLONE SODIUM SUCC 125 MG IJ SOLR
60.0000 mg | Freq: Four times a day (QID) | INTRAMUSCULAR | Status: AC
  Administered 2019-10-26 – 2019-10-27 (×4): 60 mg via INTRAVENOUS
  Filled 2019-10-26 (×4): qty 2

## 2019-10-26 NOTE — ED Notes (Signed)
This RN walked into pt's room to see that her BiPAP mask was off. Assisted pt to get her mask back on to find that pt's oxygen saturation was in the low 80's. Respiratory contacted to come and see pt.

## 2019-10-26 NOTE — ED Notes (Signed)
Resting in bed, bipap removed by RT, pt tolerating well. sats 97% on 2L. No complaints at this time, urine in collection container via pure wik. cloudy yellow urine out.

## 2019-10-26 NOTE — ED Notes (Signed)
Resting in bed with eyes closed, rise and fall of chest noted. Sats upper 90's, tolerating 2L [place] well. NAD.

## 2019-10-26 NOTE — ED Notes (Signed)
Pt repositioned in bed, tolerated well.

## 2019-10-26 NOTE — ED Notes (Signed)
Pt placed on bipap per order by RT. Pt tolerating well at this time.

## 2019-10-26 NOTE — ED Notes (Signed)
This rn walked by room, pt had taken oxygen off, sats 76 on ra. [place] placed back on pt, 2L, sats now 95%, however while this rn was placing bp cuff on pt, she proceeded to take oxygen off again, charge rn made aware, pt needs sitter.

## 2019-10-26 NOTE — ED Notes (Signed)
Increased to 40% by RT. sats 94%, pt tolerating well. Denies any c/o pain. Resting with eyes closed, RR 22.

## 2019-10-26 NOTE — ED Notes (Signed)
Sats 97% at this time, tolerating bipap well.

## 2019-10-26 NOTE — ED Provider Notes (Signed)
Procedures     ----------------------------------------- 4:24 PM on 10/26/2019 -----------------------------------------  procalcitonin elevated to 2.7, lactate normal. VS normal except tachypnea due to COPD exac. Pt is not septic. Elevated procal relayed to admitting team to consider.   Carrie Mew, MD 10/26/19 [phone]

## 2019-10-26 NOTE — ED Provider Notes (Addendum)
[facility] Emergency Department Provider Note   ____________________________________________    I have reviewed the triage vital signs and the nursing notes.   HISTORY  Chief Complaint Weakness  Patient unable to provide significant history due to altered mental status   HPI Tami Skinner is a 73 y.o. female with a history of COPD, diabetes, cardiomyopathy presents with altered mental status and weakness.  Apparently has had worsening weakness over the last 3 days, home health nurse noted patient to be lethargic today increased work of breathing.   Past Medical History:  Diagnosis Date  . Anxiety   . Asthmatic bronchitis   . COPD (chronic obstructive pulmonary disease) (HCC)   . Depression   . Diabetes mellitus   . Diverticulitis   . Diverticulosis   . Hyperlipidemia   . Hypertension   . Osteopenia   . Sleep apnea   . Stress-induced cardiomyopathy    a. echo 09/28/2014: EF 45-50%, severe HK of mid-distal anterior, apical and mid-distal inferior walls suggestive of stress induced CM, mildly dilated LA, mildly dilated PASP, mild TR, trivial pericardial effusion    Patient Active Problem List   Diagnosis Date Noted  . Acute on chronic respiratory failure with hypoxia and hypercapnia (HCC)   . NSTEMI (non-ST elevated myocardial infarction) (HCC) 10/06/2014  . Stress-induced cardiomyopathy   . Dyshidrotic eczema 10/11/2013  . BPV (benign positional vertigo) 08/02/2013  . Essential hypertension, benign 03/31/2013  . Solitary pulmonary nodule 12/20/2012  . Postmenopausal estrogen deficiency 09/07/2012  . Anxiety and depression 11/24/2011  . Diabetes mellitus type 2, controlled (HCC) 09/04/2011  . COPD (chronic obstructive pulmonary disease) (HCC) 09/04/2011  . OSA (obstructive sleep apnea) 02/17/2007  . Hyperlipidemia 04/04/2003    Past Surgical History:  Procedure Laterality Date  . ABDOMINAL HYSTERECTOMY     partial  . BREAST BIOPSY  Right    neg  . CARDIAC CATHETERIZATION  09/29/2014   [facility]  . NASAL SINUS SURGERY      Prior to Admission medications   Medication Sig Start Date End Date Taking? Authorizing Provider  albuterol (PROVENTIL HFA;VENTOLIN HFA) 108 (90 Base) MCG/ACT inhaler INHALE 2 PUFFS INTO THE LUNGS EVERY 4 (FOUR) HOURS AS NEEDED FOR WHEEZING OR SHORTNESS OF BREATH. 11/22/18   Merwyn Katos, MD  albuterol (PROVENTIL) (2.5 MG/3ML) 0.083% nebulizer solution Take 3 mLs (2.5 mg total) by nebulization every 6 (six) hours as needed for wheezing or shortness of breath. J44.9 11/22/18   Merwyn Katos, MD  ALPRAZolam Prudy Feeler) 0.5 MG tablet Take 1 tablet (0.5 mg total) by mouth 3 (three) times daily as needed for anxiety. 10/24/19   Lorre Munroe, NP  B Complex-C (B-COMPLEX WITH VITAMIN C) tablet Take 1 tablet by mouth daily. 09/28/19   Pennie Banter, DO  buPROPion (WELLBUTRIN XL) 150 MG 24 hr tablet TAKE 1 TABLET BY MOUTH EVERY DAY 09/28/19   Lorre Munroe, NP  busPIRone (BUSPAR) 10 MG tablet Take 1 tablet (10 mg total) by mouth 3 (three) times daily. 08/15/19   Lorre Munroe, NP  Cholecalciferol (VITAMIN D-3) 1000 UNITS CAPS Take 2,000 Units by mouth daily.     [provider]  conjugated estrogens (PREMARIN) vaginal cream Apply 0.5g (pea-sized amount) just inside the vaginal introitus with a finger-tip on Monday,Wednesday and Friday night 06/07/19   Lorre Munroe, NP  feeding supplement, ENSURE ENLIVE, (ENSURE ENLIVE) LIQD Take 237 mLs by mouth 2 (two) times daily between meals. 09/27/19  Esaw GrandchildGriffith, Kelly A, DO  furosemide (LASIX) 20 MG tablet Take 1 tablet (20 mg total) by mouth as needed for edema. 10/13/18   [place] OuchArida, Muhammad A, MD  guaiFENesin (MUCINEX) 600 MG 12 hr tablet Take 1 tablet (600 mg total) by mouth 2 (two) times daily. 09/27/19   Esaw GrandchildGriffith, Kelly A, DO  metoprolol succinate (TOPROL-XL) 50 MG 24 hr tablet TAKE 1 TABLET (50 MG TOTAL) BY MOUTH DAILY. TAKE WITH OR IMMEDIATELY FOLLOWING A  MEAL. 10/11/19 01/09/20  [place] OuchArida, Muhammad A, MD  nystatin (MYCOSTATIN/NYSTOP) powder Apply topically 3 (three) times daily. 07/21/19   Lorre MunroeBaity, Regina W, NP  omeprazole (PRILOSEC) 40 MG capsule TAKE 1 CAPSULE BY MOUTH EVERY DAY 09/28/19   Lorre MunroeBaity, Regina W, NP  ondansetron (ZOFRAN ODT) 8 MG disintegrating tablet Take 1 tablet (8 mg total) by mouth every 8 (eight) hours as needed for nausea or vomiting. 08/19/19   Lorre MunroeBaity, Regina W, NP  ondansetron (ZOFRAN) 4 MG tablet TAKE 1 TABLET BY MOUTH EVERY 8 HOURS AS NEEDED FOR NAUSEA AND VOMITING 10/24/19   Lorre MunroeBaity, Regina W, NP  ONE TOUCH ULTRA TEST test strip 1 EACH BY OTHER ROUTE DAILY AS NEEDED FOR OTHER. 09/01/18   Lorre MunroeBaity, Regina W, NP  oxybutynin (DITROPAN-XL) 5 MG 24 hr tablet TAKE 1 TABLET BY MOUTH EVERYDAY AT BEDTIME Patient taking differently: Take 5 mg by mouth at bedtime.  08/29/19   Lorre MunroeBaity, Regina W, NP  predniSONE (DELTASONE) 20 MG tablet Take 1 tablet (20 mg total) by mouth daily with breakfast. 09/28/19   Esaw GrandchildGriffith, Kelly A, DO  rosuvastatin (CRESTOR) 5 MG tablet TAKE 1 TABLET BY MOUTH EVERY DAY Patient taking differently: Take 5 mg by mouth daily.  09/07/19   Lorre MunroeBaity, Regina W, NP  sertraline (ZOLOFT) 100 MG tablet TAKE 2 TABLETS BY MOUTH TWICE A DAY 07/27/19   Lorre MunroeBaity, Regina W, NP  tiotropium (SPIRIVA HANDIHALER) 18 MCG inhalation capsule Place 1 capsule (18 mcg total) into inhaler and inhale daily. 10/22/18   Merwyn KatosSimonds, David B, MD  WIXELA INHUB 250-50 MCG/DOSE AEPB 1 puff 2 (two) times daily. 08/07/19   [provider]     Allergies Amoxicillin-pot clavulanate, Daliresp [roflumilast], Erythromycin base, and Neurontin [gabapentin]  Family History  Problem Relation Age of Onset  . Diabetes Daughter   . Kidney disease Daughter   . Lung cancer Mother        smoked  . COPD Mother        smoked  . COPD Father        smoked  . Prostate cancer Father   . Kidney failure Father   . Breast cancer Maternal Aunt   . Diabetes Maternal Grandmother   .  Breast cancer Maternal Grandmother   . Cancer Cousin        colon    Social History Social History   Tobacco Use  . Smoking status: Former Smoker    Packs/day: 0.25    Years: 40.00    Pack years: 10.00    Types: Cigarettes    Quit date: 09/01/2016    Years since quitting: 3.1  . Smokeless tobacco: Never Used  . Tobacco comment: Uses nicotrol inhaler on occasion  Substance Use Topics  . Alcohol use: No  . Drug use: No    Review of Systems limited by altered mental status Constitutional: No reports of fever  Respiratory: Increased work of breathing, no reports of cough Gastrointestinal: , no vomiting.   Genitourinary: No reports of foul-smelling urine  ____________________________________________   PHYSICAL EXAM:  VITAL SIGNS: ED Triage Vitals  Enc Vitals Group     BP --      Pulse Rate 10/26/19 1407 96     Resp 10/26/19 1407 (!) 22     Temp 10/26/19 1407 98.2 F (36.8 C)     Temp Source 10/26/19 1407 Oral     SpO2 10/26/19 1407 100 %     Weight 10/26/19 1408 63.5 kg (140 lb)     Height 10/26/19 1408 1.676 m (5\' 6" )     Head Circumference --      Peak Flow --      Pain Score 10/26/19 1408 0     Pain Loc --      Pain Edu? --      Excl. in [place]? --     Constitutional: Arousable but disoriented Eyes: Conjunctivae are normal.  Head: Atraumatic. Nose: No congestion/rhinnorhea. Mouth/Throat: Mucous membranes are dry.    Cardiovascular: Normal rate, regular rhythm. Grossly normal heart sounds.  Good peripheral circulation. Respiratory: Increased work of breathing with tachypnea, scattered mild wheezes, poor airflow Gastrointestinal: Soft and nontender. No distention.  Musculoskeletal:  Warm and well perfused Neurologic: Appears to move all extremities Skin:  Skin is warm, dry and intact. No rash noted. Psychiatric: Unable to examine  ____________________________________________   LABS (all labs ordered are listed, but only abnormal results are  displayed)  Labs Reviewed  COMPREHENSIVE METABOLIC PANEL - Abnormal; Notable for the following components:      Result Value   Chloride 96 (*)    CO2 35 (*)    Glucose, Bld 124 (*)    Creatinine, Ser 0.43 (*)    All other components within normal limits  CBC WITH DIFFERENTIAL/PLATELET - Abnormal; Notable for the following components:   MCHC 29.7 (*)    Abs Immature Granulocytes 0.10 (*)    All other components within normal limits  BRAIN NATRIURETIC PEPTIDE - Abnormal; Notable for the following components:   B Natriuretic Peptide 410.0 (*)    All other components within normal limits  BLOOD GAS, VENOUS - Abnormal; Notable for the following components:   pH, Ven 7.23 (*)    pCO2, Ven 104 (*)    pO2, Ven 131.0 (*)    Bicarbonate 43.6 (*)    Acid-Base Excess 11.5 (*)    All other components within normal limits  CULTURE, BLOOD (ROUTINE X 2)  CULTURE, BLOOD (ROUTINE X 2)  URINE CULTURE  RESPIRATORY PANEL BY RT PCR (FLU A&B, COVID)  LACTIC ACID, PLASMA  PROCALCITONIN  LACTIC ACID, PLASMA  URINALYSIS, COMPLETE (UACMP) WITH MICROSCOPIC   ____________________________________________  EKG  ED ECG REPORT I, Lavonia Drafts, the attending physician, personally viewed and interpreted this ECG.  Date: 11/07/2019  Rhythm: normal sinus rhythm QRS Axis: normal Intervals: Abnormal ST/T Wave abnormalities: Nonspecific changes Narrative Interpretation: no evidence of acute ischemia  ____________________________________________  RADIOLOGY  Chest x-ray no evidence of pneumonia ____________________________________________   PROCEDURES  Procedure(s) performed: No  Procedures   Critical Care performed: yes  CRITICAL CARE Performed by: Lavonia Drafts   Total critical care time: 30 minutes  Critical care time was exclusive of separately billable procedures and treating other patients.  Critical care was necessary to treat or prevent imminent or life-threatening  deterioration.  Critical care was time spent personally by me on the following activities: development of treatment plan with patient and/or surrogate as well as nursing, discussions with consultants, evaluation of patient's response to treatment,  examination of patient, obtaining history from patient or surrogate, ordering and performing treatments and interventions, ordering and review of laboratory studies, ordering and review of radiographic studies, pulse oximetry and re-evaluation of patient's condition.  ____________________________________________   INITIAL IMPRESSION / ASSESSMENT AND PLAN / ED COURSE  Pertinent labs & imaging results that were available during my care of the patient were reviewed by me and considered in my medical decision making (see chart for details).  Patient presents with altered mental status, mild tachypnea, afebrile.  History of COPD presentation suspicious for elevated CO2 versus metabolic encephalopathy secondary to sepsis.  Afebrile, less likely Covid.  We will send labs including VBG, chest x-ray, blood cultures and monitor closely  Notified of CO2 of 104 on ABG, will start BiPAP given that chest x-ray is not consistent with Covid.  Covid swab is pending.  IV Solu-Medrol ordered  Review of medical records demonstrates admission recently where patient had hypercapnia required BiPAP as well.  Awaiting Covid results before starting nebulizers.  Patient will require admission    ____________________________________________   FINAL CLINICAL IMPRESSION(S) / ED DIAGNOSES  Final diagnoses:  Acute on chronic respiratory failure with hypoxia and hypercapnia (HCC)        Note:  This document was prepared using Dragon voice recognition software and may include unintentional dictation errors.   Jene Every, MD 10/26/19 1457    Jene Every, MD 11/07/19 [phone]

## 2019-10-26 NOTE — Plan of Care (Signed)
Taken off bipap and placed on 2 lpm O2 nasal canula

## 2019-10-26 NOTE — ED Notes (Addendum)
ED TO INPATIENT HANDOFF REPORT  ED Nurse Name and Phone #: Geraldine Contras [phone]  S Name/Age/Gender [name] 73 y.o. female Room/Bed: ED03A/ED03A  Code Status   Code Status: Prior  Home/SNF/Other Home Patient oriented to: self Is this baseline? Yes   Triage Complete: Triage complete  Chief Complaint Acute respiratory failure with hypoxemia (HCC) [J96.01]  Triage Note Pt here via EMS from home with c/o increasing weakness over the past few days. Home health nurse felt pt was lethargic upon her arrival today, pt alert and oriented upon initial assessment, breathing labored using accessory muscles with occasional "grunting" sounds. Pt denies complaints other than feeling shob. sats 100% on 4L per [place]. Closes eyes between triage questions but easily arousable with verbal stimulation.     Allergies Allergies  Allergen Reactions  . Amoxicillin-Pot Clavulanate     REACTION: stomach sensitive.  Lenice Llamas [Roflumilast]     Severe nausea   . Erythromycin Base     REACTION: GI upset  . Neurontin [Gabapentin]     anxiety    Level of Care/Admitting Diagnosis ED Disposition    ED Disposition Condition Comment   Admit  Hospital Area: [facility] [[id number]]  Level of Care: Med-Surg [16]  Covid Evaluation: Confirmed COVID Negative  Diagnosis: Acute respiratory failure with hypoxemia [facility]) [[id number]]  Admitting Physician: Delfino Lovett [[id number]]  Attending Physician: Delfino Lovett [[id number]]  Estimated length of stay: past midnight tomorrow  Certification:: I certify this patient will need inpatient services for at least 2 midnights       B Medical/Surgery History Past Medical History:  Diagnosis Date  . Anxiety   . Asthmatic bronchitis   . COPD (chronic obstructive pulmonary disease) (HCC)   . Depression   . Diabetes mellitus   . Diverticulitis   . Diverticulosis   . Hyperlipidemia   . Hypertension   . Osteopenia   . Sleep apnea   . Stress-induced  cardiomyopathy    a. echo 09/28/2014: EF 45-50%, severe HK of mid-distal anterior, apical and mid-distal inferior walls suggestive of stress induced CM, mildly dilated LA, mildly dilated PASP, mild TR, trivial pericardial effusion   Past Surgical History:  Procedure Laterality Date  . ABDOMINAL HYSTERECTOMY     partial  . BREAST BIOPSY Right    neg  . CARDIAC CATHETERIZATION  09/29/2014   [facility]  . NASAL SINUS SURGERY       A IV Location/Drains/Wounds Patient Lines/Drains/Airways Status   Active Line/Drains/Airways    Name:   Placement date:   Placement time:   Site:   Days:   Peripheral IV 10/26/19 Right Wrist   10/26/19    1500    Wrist   less than 1          Intake/Output Last 24 hours No intake or output data in the 24 hours ending 10/26/19 1924  Labs/Imaging Results for orders placed or performed during the hospital encounter of 10/26/19 (from the past 48 hour(s))  Lactic acid, plasma     Status: None   Collection Time: 10/26/19  1:43 PM  Result Value Ref Range   Lactic Acid, Venous 0.5 0.5 - 1.9 mmol/L    Comment: Performed at [facility], [address] [place], [place] [address]  Comprehensive metabolic panel     Status: Abnormal   Collection Time: 10/26/19  1:45 PM  Result Value Ref Range   Sodium 142 135 - 145 mmol/L   Potassium 4.2 3.5 - 5.1 mmol/L  Chloride 96 (L) 98 - 111 mmol/L   CO2 35 (H) 22 - 32 mmol/L   Glucose, Bld 124 (H) 70 - 99 mg/dL   BUN 16 8 - 23 mg/dL   Creatinine, Ser 1.61 (L) 0.44 - 1.00 mg/dL   Calcium 9.6 8.9 - 09.6 mg/dL   Total Protein 6.9 6.5 - 8.1 g/dL   Albumin 4.2 3.5 - 5.0 g/dL   AST 17 15 - 41 U/L   ALT 17 0 - 44 U/L   Alkaline Phosphatase 55 38 - 126 U/L   Total Bilirubin 0.6 0.3 - 1.2 mg/dL   GFR calc non Af Amer >60 >60 mL/min   GFR calc Af Amer >60 >60 mL/min   Anion gap 11 5 - 15    Comment: Performed at [facility], [address] [place], [place] [address]  CBC WITH DIFFERENTIAL     Status:  Abnormal   Collection Time: 10/26/19  1:45 PM  Result Value Ref Range   WBC 7.2 4.0 - 10.5 K/uL   RBC 4.38 3.87 - 5.11 MIL/uL   Hemoglobin 12.5 12.0 - 15.0 g/dL   HCT 98.1 19.1 - 47.8 %   MCV 96.1 80.0 - 100.0 fL   MCH 28.5 26.0 - 34.0 pg   MCHC 29.7 (L) 30.0 - 36.0 g/dL   RDW 29.5 62.1 - 30.8 %   Platelets 234 150 - 400 K/uL   nRBC 0.0 0.0 - 0.2 %   Neutrophils Relative % 74 %   Neutro Abs 5.4 1.7 - 7.7 K/uL   Lymphocytes Relative 13 %   Lymphs Abs 0.9 0.7 - 4.0 K/uL   Monocytes Relative 7 %   Monocytes Absolute 0.5 0.1 - 1.0 K/uL   Eosinophils Relative 4 %   Eosinophils Absolute 0.3 0.0 - 0.5 K/uL   Basophils Relative 1 %   Basophils Absolute 0.0 0.0 - 0.1 K/uL   Immature Granulocytes 1 %   Abs Immature Granulocytes 0.10 (H) 0.00 - 0.07 K/uL    Comment: Performed at [facility], [address] [place], [place] [address]  Procalcitonin     Status: None   Collection Time: 10/26/19  1:45 PM  Result Value Ref Range   Procalcitonin <0.10 ng/mL    Comment:        Interpretation: PCT (Procalcitonin) <= 0.5 ng/mL: Systemic infection (sepsis) is not likely. Local bacterial infection is possible. (NOTE)       Sepsis PCT Algorithm           Lower Respiratory Tract                                      Infection PCT Algorithm    ----------------------------     ----------------------------         PCT < 0.25 ng/mL                PCT < 0.10 ng/mL         Strongly encourage             Strongly discourage   discontinuation of antibiotics    initiation of antibiotics    ----------------------------     -----------------------------       PCT 0.25 - 0.50 ng/mL            PCT 0.10 - 0.25 ng/mL  OR       >80% decrease in PCT            Discourage initiation of                                            antibiotics      Encourage discontinuation           of antibiotics    ----------------------------     -----------------------------         PCT >= 0.50 ng/mL               PCT 0.26 - 0.50 ng/mL               AND        <80% decrease in PCT             Encourage initiation of                                             antibiotics       Encourage continuation           of antibiotics    ----------------------------     -----------------------------        PCT >= 0.50 ng/mL                  PCT > 0.50 ng/mL               AND         increase in PCT                  Strongly encourage                                      initiation of antibiotics    Strongly encourage escalation           of antibiotics                                     -----------------------------                                           PCT <= 0.25 ng/mL                                                 OR                                        > 80% decrease in PCT                                     Discontinue / Do not initiate  antibiotics Performed at [facility], [address] [place], [place] [address]   Magnesium     Status: None   Collection Time: 10/26/19  1:45 PM  Result Value Ref Range   Magnesium 1.8 1.7 - 2.4 mg/dL    Comment: Performed at [facility], [address] [place], [place] [address]  Brain natriuretic peptide     Status: Abnormal   Collection Time: 10/26/19  1:46 PM  Result Value Ref Range   B Natriuretic Peptide 410.0 (H) 0.0 - 100.0 pg/mL    Comment: Performed at [facility], [address] [place], [place] [address]  Blood gas, venous ([facility], AP, [facility])     Status: Abnormal   Collection Time: 10/26/19  1:46 PM  Result Value Ref Range   pH, Ven 7.23 (L) 7.250 - 7.430   pCO2, Ven 104 (HH) 44.0 - 60.0 mmHg    Comment: CRITICAL RESULT CALLED TO, READ BACK BY AND VERIFIED WITH: NOTIFIED DR.Cyril Loosen, ROBERT AT 1408 ON 10/26/2019 BY GKM    pO2, Ven 131.0 (H) 32.0 - 45.0 mmHg   Bicarbonate 43.6 (H) 20.0 - 28.0 mmol/L   Acid-Base Excess 11.5 (H) 0.0 - 2.0 mmol/L   O2  Saturation 98.5 %   Patient temperature 37.0    Collection site VEIN    Sample type VENOUS     Comment: Performed at [facility], [address], [place], [place] [address]  Respiratory Panel by RT PCR (Flu A&B, Covid) - Nasopharyngeal Swab     Status: None   Collection Time: 10/26/19  2:28 PM   Specimen: Nasopharyngeal Swab  Result Value Ref Range   SARS Coronavirus 2 by RT PCR NEGATIVE NEGATIVE    Comment: (NOTE) SARS-CoV-2 target nucleic acids are NOT DETECTED. The SARS-CoV-2 RNA is generally detectable in upper respiratoy specimens during the acute phase of infection. The lowest concentration of SARS-CoV-2 viral copies this assay can detect is 131 copies/mL. A negative result does not preclude SARS-Cov-2 infection and should not be used as the sole basis for treatment or other patient management decisions. A negative result may occur with  improper specimen collection/handling, submission of specimen other than nasopharyngeal swab, presence of viral mutation(s) within the areas targeted by this assay, and inadequate number of viral copies (<131 copies/mL). A negative result must be combined with clinical observations, patient history, and epidemiological information. The expected result is Negative. Fact Sheet for Patients:  https://www.moore.com/ Fact Sheet for Healthcare Providers:  https://www.young.biz/ This test is not yet ap proved or cleared by the [place] FDA and  has been authorized for detection and/or diagnosis of SARS-CoV-2 by FDA under an Emergency Use Authorization (EUA). This EUA will remain  in effect (meaning this test can be used) for the duration of the COVID-19 declaration under Section 564(b)(1) of the Act, 21 U.S.C. section 360bbb-3(b)(1), unless the authorization is terminated or revoked sooner.    Influenza A by PCR NEGATIVE NEGATIVE   Influenza B by PCR NEGATIVE NEGATIVE    Comment: (NOTE) The  Xpert Xpress SARS-CoV-2/FLU/RSV assay is intended as an aid in  the diagnosis of influenza from Nasopharyngeal swab specimens and  should not be used as a sole basis for treatment. Nasal washings and  aspirates are unacceptable for Xpert Xpress SARS-CoV-2/FLU/RSV  testing. Fact Sheet for Patients: https://www.moore.com/ Fact Sheet for Healthcare Providers: https://www.young.biz/ This test is not yet approved or cleared by the [place] FDA and  has been authorized for detection and/or diagnosis  of SARS-CoV-2 by  FDA under an Emergency Use Authorization (EUA). This EUA will remain  in effect (meaning this test can be used) for the duration of the  Covid-19 declaration under Section 564(b)(1) of the Act, 21  U.S.C. section 360bbb-3(b)(1), unless the authorization is  terminated or revoked. Performed at [facility], [address] [place], [place] [address]   Urinalysis, Complete w Microscopic     Status: Abnormal   Collection Time: 10/26/19  3:21 PM  Result Value Ref Range   Color, Urine YELLOW (A) YELLOW   APPearance CLOUDY (A) CLEAR   Specific Gravity, Urine 1.021 1.005 - 1.030   pH 5.0 5.0 - 8.0   Glucose, UA NEGATIVE NEGATIVE mg/dL   Hgb urine dipstick NEGATIVE NEGATIVE   Bilirubin Urine NEGATIVE NEGATIVE   Ketones, ur 20 (A) NEGATIVE mg/dL   Protein, ur 30 (A) NEGATIVE mg/dL   Nitrite NEGATIVE NEGATIVE   Leukocytes,Ua NEGATIVE NEGATIVE   RBC / HPF 6-10 0 - 5 RBC/hpf   WBC, UA 0-5 0 - 5 WBC/hpf   Bacteria, UA NONE SEEN NONE SEEN   Squamous Epithelial / LPF 0-5 0 - 5   Mucus PRESENT     Comment: Performed at [facility], [address] [place], [place] [address]  Blood gas, arterial     Status: Abnormal   Collection Time: 10/26/19  6:38 PM  Result Value Ref Range   FIO2 0.28    Delivery systems NASAL CANNULA    pH, Arterial 7.34 (L) 7.350 - 7.450   pCO2 arterial 81 (HH) 32.0 - 48.0 mmHg    Comment:  CRITICAL RESULT CALLED TO, READ BACK BY AND VERIFIED WITH: NOTIFIED TO DR.STAFFORD, P AT 1842 ON 10/26/2019 BY GKM    pO2, Arterial 66 (L) 83.0 - 108.0 mmHg   Bicarbonate 43.7 (H) 20.0 - 28.0 mmol/L   Acid-Base Excess 14.0 (H) 0.0 - 2.0 mmol/L   O2 Saturation 91.5 %   Patient temperature 37.0    Collection site RIGHT BRACHIAL    Sample type ARTERIAL DRAW    Allens test (pass/fail) PASS PASS    Comment: Performed at [facility], [address] [place], [place] [address]   DG Chest Port 1 View  Result Date: 10/26/2019 CLINICAL DATA:  [age] year old female with increasing weakness and altered mental status. EXAM: PORTABLE CHEST 1 VIEW COMPARISON:  Chest radiographs 09/22/2019 and earlier. FINDINGS: Portable AP upright view at 1356 hours. Stable lung volumes and mediastinal contours. Visualized tracheal air column is within normal limits. No pneumothorax, pulmonary edema, pleural effusion or confluent pulmonary opacity. Stable chronic increased upper lobe interstitial markings. No acute osseous abnormality identified. IMPRESSION: No acute cardiopulmonary abnormality. Electronically Signed   By: Odessa FlemingH  Hall M.D.   On: 10/26/2019 14:21    Pending Labs Unresulted Labs (From admission, onward)    Start     Ordered   10/26/19 1338  Lactic acid, plasma  Now then every 2 hours,   STAT     10/26/19 1337   10/26/19 1338  Blood Culture (routine x 2)  BLOOD CULTURE X 2,   STAT     10/26/19 1337   10/26/19 1338  Urine culture  ONCE - STAT,   STAT     10/26/19 1337   Signed and Held  HIV Antibody (routine testing w rflx)  (HIV Antibody (Routine testing w reflex) panel)  Once,   R     Signed and Held   Signed and Held  CBC  (enoxaparin (LOVENOX)    CrCl >/= 30 ml/min)  Once,   R    Comments: Baseline for enoxaparin therapy IF NOT ALREADY DRAWN.  Notify MD if PLT < 100 K.    Signed and Held   Signed and Held  Creatinine, serum  (enoxaparin (LOVENOX)    CrCl >/= 30 ml/min)  Once,   R    Comments:  Baseline for enoxaparin therapy IF NOT ALREADY DRAWN.    Signed and Held   Signed and Held  Creatinine, serum  (enoxaparin (LOVENOX)    CrCl >/= 30 ml/min)  Weekly,   R    Comments: while on enoxaparin therapy    Signed and Held   Signed and Held  Culture, sputum-assessment  Once,   R     Signed and Held          Vitals/Pain Today's Vitals   10/26/19 1800 10/26/19 1815 10/26/19 1830 10/26/19 1845  BP: 129/70  134/61   Pulse: 91 93 94 72  Resp:      Temp:      TempSrc:      SpO2: 93% 91% 95% (!) 80%  Weight:      Height:      PainSc:        Isolation Precautions No active isolations  Medications Medications  methylPREDNISolone sodium succinate (SOLU-MEDROL) 125 mg/2 mL injection 125 mg (125 mg Intravenous Given 10/26/19 1434)  ipratropium-albuterol (DUONEB) 0.5-2.5 (3) MG/3ML nebulizer solution 3 mL (3 mLs Nebulization Given 10/26/19 1643)  albuterol (PROVENTIL) (2.5 MG/3ML) 0.083% nebulizer solution 5 mg (5 mg Nebulization Given 10/26/19 1643)  magnesium sulfate IVPB 2 g 50 mL (0 g Intravenous Stopped 10/26/19 1747)    Mobility walks with person assist High fall risk   Focused Assessments    R Recommendations: See Admitting Provider Note  Report given to: Butch Penny, RN

## 2019-10-26 NOTE — H&P (Addendum)
[facility] NAME: Tami Skinner    MR#:  [id number]  DATE OF BIRTH:  [birth year]  DATE OF ADMISSION:  10/26/2019  PRIMARY CARE PHYSICIAN: Jearld Fenton, NP   REQUESTING/REFERRING PHYSICIAN: Carrie Mew, MD  CHIEF COMPLAINT:   Chief Complaint  Patient presents with  . Weakness    HISTORY OF PRESENT ILLNESS:  Tami Skinner  is a 73 y.o. female with a known history of COPD, diabetes, cardiomyopathy presented to emergency department for altered mental status and weakness.    Per chart review patient reported worsening weakness over the last 3 days, home health nurse noted patient to be lethargic today with increased work of breathing.  When I evaluated the patient she is lethargic and is on BiPAP.  PAST MEDICAL HISTORY:   Past Medical History:  Diagnosis Date  . Anxiety   . Asthmatic bronchitis   . COPD (chronic obstructive pulmonary disease) ([place])   . Depression   . Diabetes mellitus   . Diverticulitis   . Diverticulosis   . Hyperlipidemia   . Hypertension   . Osteopenia   . Sleep apnea   . Stress-induced cardiomyopathy    a. echo 09/28/2014: EF 45-50%, severe HK of mid-distal anterior, apical and mid-distal inferior walls suggestive of stress induced CM, mildly dilated LA, mildly dilated PASP, mild TR, trivial pericardial effusion    PAST SURGICAL HISTORY:   Past Surgical History:  Procedure Laterality Date  . ABDOMINAL HYSTERECTOMY     partial  . BREAST BIOPSY Right    neg  . CARDIAC CATHETERIZATION  09/29/2014   [facility]  . NASAL SINUS SURGERY      SOCIAL HISTORY:   Social History   Tobacco Use  . Smoking status: Former Smoker    Packs/day: 0.25    Years: 40.00    Pack years: 10.00    Types: Cigarettes    Quit date: 09/01/2016    Years since quitting: 3.1  . Smokeless tobacco: Never Used  . Tobacco comment: Uses nicotrol inhaler on occasion  Substance Use Topics  . Alcohol use: No    FAMILY HISTORY:   Family  History  Problem Relation Age of Onset  . Diabetes Daughter   . Kidney disease Daughter   . Lung cancer Mother        smoked  . COPD Mother        smoked  . COPD Father        smoked  . Prostate cancer Father   . Kidney failure Father   . Breast cancer Maternal Aunt   . Diabetes Maternal Grandmother   . Breast cancer Maternal Grandmother   . Cancer Cousin        colon    DRUG ALLERGIES:   Allergies  Allergen Reactions  . Amoxicillin-Pot Clavulanate     REACTION: stomach sensitive.  Newton Pigg [Roflumilast]     Severe nausea   . Erythromycin Base     REACTION: GI upset  . Neurontin [Gabapentin]     anxiety    REVIEW OF SYSTEMS:   ROS unable to obtain due to patient being on BiPAP  MEDICATIONS AT HOME:   Prior to Admission medications   Medication Sig Start Date End Date Taking? Authorizing Provider  albuterol (PROVENTIL HFA;VENTOLIN HFA) 108 (90 Base) MCG/ACT inhaler INHALE 2 PUFFS INTO THE LUNGS EVERY 4 (FOUR) HOURS AS NEEDED FOR WHEEZING OR SHORTNESS OF BREATH. 11/22/18   Wilhelmina Mcardle, MD  albuterol (  PROVENTIL) (2.5 MG/3ML) 0.083% nebulizer solution Take 3 mLs (2.5 mg total) by nebulization every 6 (six) hours as needed for wheezing or shortness of breath. J44.9 11/22/18   Merwyn Katos, MD  ALPRAZolam Prudy Feeler) 0.5 MG tablet Take 1 tablet (0.5 mg total) by mouth 3 (three) times daily as needed for anxiety. 10/24/19   Lorre Munroe, NP  B Complex-C (B-COMPLEX WITH VITAMIN C) tablet Take 1 tablet by mouth daily. 09/28/19   Pennie Banter, DO  buPROPion (WELLBUTRIN XL) 150 MG 24 hr tablet TAKE 1 TABLET BY MOUTH EVERY DAY 09/28/19   Lorre Munroe, NP  busPIRone (BUSPAR) 10 MG tablet Take 1 tablet (10 mg total) by mouth 3 (three) times daily. 08/15/19   Lorre Munroe, NP  Cholecalciferol (VITAMIN D-3) 1000 UNITS CAPS Take 2,000 Units by mouth daily.     [provider]  conjugated estrogens (PREMARIN) vaginal cream Apply 0.5g (pea-sized amount) just  inside the vaginal introitus with a finger-tip on Monday,Wednesday and Friday night 06/07/19   Lorre Munroe, NP  feeding supplement, ENSURE ENLIVE, (ENSURE ENLIVE) LIQD Take 237 mLs by mouth 2 (two) times daily between meals. 09/27/19   Pennie Banter, DO  furosemide (LASIX) 20 MG tablet Take 1 tablet (20 mg total) by mouth as needed for edema. 10/13/18   Iran Ouch, MD  guaiFENesin (MUCINEX) 600 MG 12 hr tablet Take 1 tablet (600 mg total) by mouth 2 (two) times daily. 09/27/19   Esaw Grandchild A, DO  metoprolol succinate (TOPROL-XL) 50 MG 24 hr tablet TAKE 1 TABLET (50 MG TOTAL) BY MOUTH DAILY. TAKE WITH OR IMMEDIATELY FOLLOWING A MEAL. 10/11/19 01/09/20  Iran Ouch, MD  nystatin (MYCOSTATIN/NYSTOP) powder Apply topically 3 (three) times daily. 07/21/19   Lorre Munroe, NP  omeprazole (PRILOSEC) 40 MG capsule TAKE 1 CAPSULE BY MOUTH EVERY DAY 09/28/19   Lorre Munroe, NP  ondansetron (ZOFRAN ODT) 8 MG disintegrating tablet Take 1 tablet (8 mg total) by mouth every 8 (eight) hours as needed for nausea or vomiting. 08/19/19   Lorre Munroe, NP  ondansetron (ZOFRAN) 4 MG tablet TAKE 1 TABLET BY MOUTH EVERY 8 HOURS AS NEEDED FOR NAUSEA AND VOMITING 10/24/19   Lorre Munroe, NP  ONE TOUCH ULTRA TEST test strip 1 EACH BY OTHER ROUTE DAILY AS NEEDED FOR OTHER. 09/01/18   Lorre Munroe, NP  oxybutynin (DITROPAN-XL) 5 MG 24 hr tablet TAKE 1 TABLET BY MOUTH EVERYDAY AT BEDTIME Patient taking differently: Take 5 mg by mouth at bedtime.  08/29/19   Lorre Munroe, NP  predniSONE (DELTASONE) 20 MG tablet Take 1 tablet (20 mg total) by mouth daily with breakfast. 09/28/19   Esaw Grandchild A, DO  rosuvastatin (CRESTOR) 5 MG tablet TAKE 1 TABLET BY MOUTH EVERY DAY Patient taking differently: Take 5 mg by mouth daily.  09/07/19   Lorre Munroe, NP  sertraline (ZOLOFT) 100 MG tablet TAKE 2 TABLETS BY MOUTH TWICE A DAY 07/27/19   Lorre Munroe, NP  tiotropium (SPIRIVA HANDIHALER) 18 MCG  inhalation capsule Place 1 capsule (18 mcg total) into inhaler and inhale daily. 10/22/18   Merwyn Katos, MD  WIXELA INHUB 250-50 MCG/DOSE AEPB 1 puff 2 (two) times daily. 08/07/19   [provider]      VITAL SIGNS:  Blood pressure (!) 141/86, pulse 89, temperature 98.2 F (36.8 C), temperature source Oral, resp. rate (!) 22, height 5\' 6"  (1.676 m), weight  63.5 kg, SpO2 94 %.  PHYSICAL EXAMINATION:  Physical Exam  GENERAL:  73 y.o.-year-old patient lying in the bed in acute respiratory distress.  She is on BiPAP EYES: Pupils equal, round, reactive to light and accommodation. No scleral icterus. Extraocular muscles intact.  HEENT: Head atraumatic, normocephalic. Oropharynx and nasopharynx clear.  NECK:  Supple, no jugular venous distention. No thyroid enlargement, no tenderness.  LUNGS: Decreased breath sounds bilaterally, Increased work of breathing with tachypnea, scattered mild wheezes, poor airflow.  Using accessory muscles of respiration.  CARDIOVASCULAR: S1, S2 normal. No murmurs, rubs, or gallops.  ABDOMEN: Soft, nontender, nondistended. Bowel sounds present. No organomegaly or mass.  EXTREMITIES: No pedal edema, cyanosis, or clubbing.  NEUROLOGIC: Cranial nerves II through XII are intact. Muscle strength 5/5 in all extremities. Sensation intact. Gait not checked.  PSYCHIATRIC: The patient is lethargic. SKIN: No obvious rash, lesion, or ulcer.   LABORATORY PANEL:   CBC Recent Labs  Lab 10/26/19 1345  WBC 7.2  HGB 12.5  HCT 42.1  PLT 234   ------------------------------------------------------------------------------------------------------------------  Chemistries  Recent Labs  Lab 10/26/19 1345  NA 142  K 4.2  CL 96*  CO2 35*  GLUCOSE 124*  BUN 16  CREATININE 0.43*  CALCIUM 9.6  AST 17  ALT 17  ALKPHOS 55  BILITOT 0.6   ------------------------------------------------------------------------------------------------------------------  Cardiac  Enzymes No results for input(s): TROPONINI in the last 168 hours. ------------------------------------------------------------------------------------------------------------------  RADIOLOGY:  DG Chest Port 1 View  Result Date: 10/26/2019 CLINICAL DATA:  73 year old female with increasing weakness and altered mental status. EXAM: PORTABLE CHEST 1 VIEW COMPARISON:  Chest radiographs 09/22/2019 and earlier. FINDINGS: Portable AP upright view at 1356 hours. Stable lung volumes and mediastinal contours. Visualized tracheal air column is within normal limits. No pneumothorax, pulmonary edema, pleural effusion or confluent pulmonary opacity. Stable chronic increased upper lobe interstitial markings. No acute osseous abnormality identified. IMPRESSION: No acute cardiopulmonary abnormality. Electronically Signed   By: Odessa FlemingH  Hall M.D.   On: 10/26/2019 14:21   IMPRESSION AND PLAN:  73 yo female DNR/DNI admitted with acute on chronic hypercapnic hypoxic respiratory failure secondary to AECOPD requiring Bipap   * Acute on chronic hypoxic hypercapnic respiratory failure secondary to AECOPD Hx: OSA and Asthmatic Bronchitis Prn Bipap for dyspnea and hypercapnia  IV and nebulized steroids  Scheduled and prn bronchodilator therapy Empiric antibiotics Trend WBC and monitor fever  Follow cultures  Normal PCT  * Hypertension  Hx: Chronic Diastolic CHF, Hyperlipidemia and Stress-Induced Cardiomyopathy  Continue outpatient cardiac medications   Type II Diabetes Mellitus  CBG's ac/hs  SSI -diabetic nurse consult  GERD Continue po protonix   Depression/Anxiety  Continue zoloft and prn xanax     Covid and influenza A/B is negative   All the records are reviewed and case discussed with ED provider. Management plans discussed with the patient, nursing and they are in agreement.  CODE STATUS: DNR  TOTAL TIME TAKING CARE OF THIS PATIENT: 45 minutes.    Delfino LovettVipul Dalaya Suppa M.D on 10/26/2019 at 4:12  PM  Between 7am to 6pm - Pager - [phone]  After 6pm go to [email or web address] - password [email or web address]  [facility]   CC: Primary care physician; Lorre MunroeBaity, Regina W, NP   Note: This dictation was prepared with Dragon dictation along with smaller phrase technology. Any transcriptional errors that result from this process are unintentional.

## 2019-10-26 NOTE — ED Notes (Signed)
This rn spoke to pts home nurse to inform we are placing her on bipap, husband was in the room to hear update as well.  Husband Talina Pleitez [phone].

## 2019-10-26 NOTE — ED Notes (Signed)
Respiratory at bedside.

## 2019-10-26 NOTE — ED Triage Notes (Signed)
Pt here via EMS from home with c/o increasing weakness over the past few days. Home health nurse felt pt was lethargic upon her arrival today, pt alert and oriented upon initial assessment, breathing labored using accessory muscles with occasional "grunting" sounds. Pt denies complaints other than feeling shob. sats 100% on 4L per [facility]. Closes eyes between triage questions but easily arousable with verbal stimulation.

## 2019-10-27 ENCOUNTER — Other Ambulatory Visit: Payer: Self-pay

## 2019-10-27 DIAGNOSIS — F419 Anxiety disorder, unspecified: Secondary | ICD-10-CM

## 2019-10-27 DIAGNOSIS — F329 Major depressive disorder, single episode, unspecified: Secondary | ICD-10-CM

## 2019-10-27 DIAGNOSIS — F411 Generalized anxiety disorder: Secondary | ICD-10-CM

## 2019-10-27 LAB — HIV ANTIBODY (ROUTINE TESTING W REFLEX): HIV Screen 4th Generation wRfx: NONREACTIVE

## 2019-10-27 MED ORDER — ALPRAZOLAM 0.5 MG PO TABS
1.0000 mg | ORAL_TABLET | Freq: Three times a day (TID) | ORAL | Status: DC | PRN
  Administered 2019-10-27 – 2019-10-29 (×4): 1 mg via ORAL
  Filled 2019-10-27 (×4): qty 2

## 2019-10-27 MED ORDER — TIOTROPIUM BROMIDE MONOHYDRATE 18 MCG IN CAPS
18.0000 ug | ORAL_CAPSULE | Freq: Every day | RESPIRATORY_TRACT | Status: DC
  Administered 2019-10-27 – 2019-10-29 (×3): 18 ug via RESPIRATORY_TRACT
  Filled 2019-10-27: qty 5

## 2019-10-27 MED ORDER — ALBUTEROL SULFATE (2.5 MG/3ML) 0.083% IN NEBU
2.5000 mg | INHALATION_SOLUTION | RESPIRATORY_TRACT | Status: DC | PRN
  Administered 2019-10-27: 2.5 mg via RESPIRATORY_TRACT
  Filled 2019-10-27: qty 3

## 2019-10-27 MED ORDER — LORAZEPAM 2 MG/ML IJ SOLN
1.0000 mg | Freq: Once | INTRAMUSCULAR | Status: AC
  Administered 2019-10-27: 1 mg via INTRAVENOUS

## 2019-10-27 MED ORDER — LORAZEPAM 2 MG/ML IJ SOLN
INTRAMUSCULAR | Status: AC
  Filled 2019-10-27: qty 1

## 2019-10-27 MED ORDER — ALBUTEROL SULFATE (2.5 MG/3ML) 0.083% IN NEBU
2.5000 mg | INHALATION_SOLUTION | Freq: Three times a day (TID) | RESPIRATORY_TRACT | Status: DC
  Administered 2019-10-27 – 2019-10-29 (×5): 2.5 mg via RESPIRATORY_TRACT
  Filled 2019-10-27 (×5): qty 3

## 2019-10-27 MED ORDER — FUROSEMIDE 40 MG PO TABS
40.0000 mg | ORAL_TABLET | Freq: Two times a day (BID) | ORAL | Status: DC
  Administered 2019-10-27 – 2019-10-29 (×4): 40 mg via ORAL
  Filled 2019-10-27 (×4): qty 1

## 2019-10-27 MED ORDER — AZITHROMYCIN 500 MG PO TABS
500.0000 mg | ORAL_TABLET | Freq: Every day | ORAL | Status: DC
  Administered 2019-10-28 (×2): 500 mg via ORAL
  Filled 2019-10-27: qty 1

## 2019-10-27 NOTE — Consult Note (Signed)
[facility] Face-to-Face Psychiatry Consult   Reason for Consult:  anxiety Referring Physician:  Dr. Sherryll Burger Patient Identification: Tami Skinner MRN:  [id number] Principal Diagnosis: <principal problem not specified> Diagnosis:  Active Problems:   Acute respiratory failure with hypoxemia (HCC)   Total Time spent with patient: 30 minutes  Subjective:   Tami Skinner is a 73 y.o. female patient a with a history of anxiety, depression, COPD, diabetes, cardiomyopathy presented in ER yesterday ago with altered mental status and worsening weakness over the last 3 days, home health nurse noted patient to be lethargic with increased work of breathing. Patient was admitted to a medical floor.  Psychiatry consulted for anxiety.  Patient reports uicreased anxiety in settings of worsened medical condition. Patient reports feeling tense when she thinks about the current life situation/being in the hospital, restless, experiencing difficulty concentrating because of worry, fear due to uncertain outcome. Patient also reports panic attacks as sudden unexpected surge of intense fear or discomfort that peaks within minutes and characterized by racing heart, sweating, choking sensation which last for several min and leave on its own. Currently identifies her mood as "a little depressed". She denies feeling hopeless, helpless. Denies passive death wishes. Denies having any thoughts of harming self or others. She denies past suicidal attempts. Patient denies any current or past symptoms of mania, such as increased energy, feeling irritable, easily distractible, unusual talkativeness. Denies decreased need for sleep. Patient denies any current or past hallucinations, illusions. Patient does not express any delusions. Patient reports feeling safe in their environment and in the hospital Reports occasionally poor sleep, but states she slept well last night. Denies problems with appetite.   Current psych medications in the  chart: Xanax 0.5mg  PO TID PRN anxiety, Wellbutrin  PO Qday, Buspirone  PO TID. Patient reports she was not taking Wellbutrin at home anymore and was switched by her PCP to Zoloft instead, she cannot recall the dose.  Past Psychiatric History:  Previous psych diagnoses: depression, anxiety Previous psychiatric hospitalizations: denies Outpatient psychiatrist: none currently, managed by PCP.  Therapist/Counselor: no History of prior suicide attempts: denies Non-suicidal self-injurious behaviors: denies History of violence: denies Current psych medications: Xanax, Buspirone, Zoloft.  Social History: she is retired, married, had one alive child, another child deceased; she used to work in [employer]. Denies having guns at home. She denies substance use.  Risk to Self:  no Risk to Others:  no Prior Inpatient Therapy:  no Prior Outpatient Therapy:  yes  Past Medical History:  Past Medical History:  Diagnosis Date  . Anxiety   . Asthmatic bronchitis   . COPD (chronic obstructive pulmonary disease) (HCC)   . Depression   . Diabetes mellitus   . Diverticulitis   . Diverticulosis   . Hyperlipidemia   . Hypertension   . Osteopenia   . Sleep apnea   . Stress-induced cardiomyopathy    a. echo 09/28/2014: EF 45-50%, severe HK of mid-distal anterior, apical and mid-distal inferior walls suggestive of stress induced CM, mildly dilated LA, mildly dilated PASP, mild TR, trivial pericardial effusion    Past Surgical History:  Procedure Laterality Date  . ABDOMINAL HYSTERECTOMY     partial  . BREAST BIOPSY Right    neg  . CARDIAC CATHETERIZATION  09/29/2014   [facility]  . NASAL SINUS SURGERY     Family History:  Family History  Problem Relation Age of Onset  . Diabetes Daughter   . Kidney disease Daughter   .  Lung cancer Mother        smoked  . COPD Mother        smoked  . COPD Father        smoked  . Prostate cancer Father   . Kidney failure Father    . Breast cancer Maternal Aunt   . Diabetes Maternal Grandmother   . Breast cancer Maternal Grandmother   . Cancer Cousin        colon   Family Psychiatric  History: N/A Social History:  Social History   Substance and Sexual Activity  Alcohol Use No     Social History   Substance and Sexual Activity  Drug Use No    Social History   Socioeconomic History  . Marital status: Married    Spouse name: Not on file  . Number of children: Not on file  . Years of education: Not on file  . Highest education level: Not on file  Occupational History  . Not on file  Tobacco Use  . Smoking status: Former Smoker    Packs/day: 0.25    Years: 40.00    Pack years: 10.00    Types: Cigarettes    Quit date: 09/01/2016    Years since quitting: 3.1  . Smokeless tobacco: Never Used  . Tobacco comment: Uses nicotrol inhaler on occasion  Substance and Sexual Activity  . Alcohol use: No  . Drug use: No  . Sexual activity: Not Currently  Other Topics Concern  . Not on file  Social History Narrative   Lives with husband in [place]. 2 dogs. Works at [employer].   Social Determinants of Health   Financial Resource Strain:   . Difficulty of Paying Living Expenses: Not on file  Food Insecurity:   . Worried About Programme researcher, broadcasting/film/video in the Last Year: Not on file  . Ran Out of Food in the Last Year: Not on file  Transportation Needs:   . Lack of Transportation (Medical): Not on file  . Lack of Transportation (Non-Medical): Not on file  Physical Activity:   . Days of Exercise per Week: Not on file  . Minutes of Exercise per Session: Not on file  Stress:   . Feeling of Stress : Not on file  Social Connections:   . Frequency of Communication with Friends and Family: Not on file  . Frequency of Social Gatherings with Friends and Family: Not on file  . Attends Religious Services: Not on file  . Active Member of Clubs or Organizations: Not on file  . Attends Banker Meetings: Not on  file  . Marital Status: Not on file   Additional Social History:    Allergies:   Allergies  Allergen Reactions  . Amoxicillin-Pot Clavulanate     REACTION: stomach sensitive.  Lenice Llamas [Roflumilast]     Severe nausea   . Erythromycin Base     REACTION: GI upset (cramping and diarrhea)  . Neurontin [Gabapentin]     anxiety    Labs:  Results for orders placed or performed during the hospital encounter of 10/26/19 (from the past 48 hour(s))  Lactic acid, plasma     Status: None   Collection Time: 10/26/19  1:43 PM  Result Value Ref Range   Lactic Acid, Venous 0.5 0.5 - 1.9 mmol/L    Comment: Performed at [facility], [address], [place], [place] [address]  Comprehensive metabolic panel     Status: Abnormal   Collection Time: 10/26/19  1:45 PM  Result Value Ref Range   Sodium 142 135 - 145 mmol/L   Potassium 4.2 3.5 - 5.1 mmol/L   Chloride 96 (L) 98 - 111 mmol/L   CO2 35 (H) 22 - 32 mmol/L   Glucose, Bld 124 (H) 70 - 99 mg/dL   BUN 16 8 - 23 mg/dL   Creatinine, Ser 1.19 (L) 0.44 - 1.00 mg/dL   Calcium 9.6 8.9 - 14.7 mg/dL   Total Protein 6.9 6.5 - 8.1 g/dL   Albumin 4.2 3.5 - 5.0 g/dL   AST 17 15 - 41 U/L   ALT 17 0 - 44 U/L   Alkaline Phosphatase 55 38 - 126 U/L   Total Bilirubin 0.6 0.3 - 1.2 mg/dL   GFR calc non Af Amer >60 >60 mL/min   GFR calc Af Amer >60 >60 mL/min   Anion gap 11 5 - 15    Comment: Performed at [facility], [address] [place], [place] [address]  CBC WITH DIFFERENTIAL     Status: Abnormal   Collection Time: 10/26/19  1:45 PM  Result Value Ref Range   WBC 7.2 4.0 - 10.5 K/uL   RBC 4.38 3.87 - 5.11 MIL/uL   Hemoglobin 12.5 12.0 - 15.0 g/dL   HCT 21.3 08.6 - 57.8 %   MCV 96.1 80.0 - 100.0 fL   MCH 28.5 26.0 - 34.0 pg   MCHC 29.7 (L) 30.0 - 36.0 g/dL   RDW 46.9 62.9 - 52.8 %   Platelets 234 150 - 400 K/uL   nRBC 0.0 0.0 - 0.2 %   Neutrophils Relative % 74 %   Neutro Abs 5.4 1.7 - 7.7 K/uL   Lymphocytes  Relative 13 %   Lymphs Abs 0.9 0.7 - 4.0 K/uL   Monocytes Relative 7 %   Monocytes Absolute 0.5 0.1 - 1.0 K/uL   Eosinophils Relative 4 %   Eosinophils Absolute 0.3 0.0 - 0.5 K/uL   Basophils Relative 1 %   Basophils Absolute 0.0 0.0 - 0.1 K/uL   Immature Granulocytes 1 %   Abs Immature Granulocytes 0.10 (H) 0.00 - 0.07 K/uL    Comment: Performed at [facility], [address] [place], [place] [address]  Blood Culture (routine x 2)     Status: None (Preliminary result)   Collection Time: 10/26/19  1:45 PM   Specimen: BLOOD  Result Value Ref Range   Specimen Description BLOOD RIGHT ANTECUBITAL    Special Requests      BOTTLES DRAWN AEROBIC AND ANAEROBIC Blood Culture results may not be optimal due to an excessive volume of blood received in culture bottles   Culture      NO GROWTH < 24 HOURS Performed at [facility], [address] [place], [place] [address]    Report Status PENDING   Procalcitonin     Status: None   Collection Time: 10/26/19  1:45 PM  Result Value Ref Range   Procalcitonin <0.10 ng/mL    Comment:        Interpretation: PCT (Procalcitonin) <= 0.5 ng/mL: Systemic infection (sepsis) is not likely. Local bacterial infection is possible. (NOTE)       Sepsis PCT Algorithm           Lower Respiratory Tract  Infection PCT Algorithm    ----------------------------     ----------------------------         PCT < 0.25 ng/mL                PCT < 0.10 ng/mL         Strongly encourage             Strongly discourage   discontinuation of antibiotics    initiation of antibiotics    ----------------------------     -----------------------------       PCT 0.25 - 0.50 ng/mL            PCT 0.10 - 0.25 ng/mL               OR       >80% decrease in PCT            Discourage initiation of                                            antibiotics      Encourage discontinuation           of antibiotics     ----------------------------     -----------------------------         PCT >= 0.50 ng/mL              PCT 0.26 - 0.50 ng/mL               AND        <80% decrease in PCT             Encourage initiation of                                             antibiotics       Encourage continuation           of antibiotics    ----------------------------     -----------------------------        PCT >= 0.50 ng/mL                  PCT > 0.50 ng/mL               AND         increase in PCT                  Strongly encourage                                      initiation of antibiotics    Strongly encourage escalation           of antibiotics                                     -----------------------------                                           PCT <= 0.25 ng/mL  OR                                        > 80% decrease in PCT                                     Discontinue / Do not initiate                                             antibiotics Performed at [facility], [address] [place], [place] [address]   Magnesium     Status: None   Collection Time: 10/26/19  1:45 PM  Result Value Ref Range   Magnesium 1.8 1.7 - 2.4 mg/dL    Comment: Performed at [facility], [address] [place], [place] [address]  Brain natriuretic peptide     Status: Abnormal   Collection Time: 10/26/19  1:46 PM  Result Value Ref Range   B Natriuretic Peptide 410.0 (H) 0.0 - 100.0 pg/mL    Comment: Performed at [facility], [address] [place], [place] [address]  Blood gas, venous ([facility], [facility], [facility])     Status: Abnormal   Collection Time: 10/26/19  1:46 PM  Result Value Ref Range   pH, Ven 7.23 (L) 7.250 - 7.430   pCO2, Ven 104 (HH) 44.0 - 60.0 mmHg    Comment: CRITICAL RESULT CALLED TO, READ BACK BY AND VERIFIED WITH: NOTIFIED DR.Cyril Loosen, ROBERT AT 1408 ON 10/26/2019 BY GKM    pO2, Ven 131.0 (H) 32.0 - 45.0 mmHg    Bicarbonate 43.6 (H) 20.0 - 28.0 mmol/L   Acid-Base Excess 11.5 (H) 0.0 - 2.0 mmol/L   O2 Saturation 98.5 %   Patient temperature 37.0    Collection site VEIN    Sample type VENOUS     Comment: Performed at [facility], [address], [place], [place] [address]  Blood Culture (routine x 2)     Status: None (Preliminary result)   Collection Time: 10/26/19  2:16 PM   Specimen: BLOOD  Result Value Ref Range   Specimen Description BLOOD BLOOD RIGHT WRIST    Special Requests      BOTTLES DRAWN AEROBIC AND ANAEROBIC Blood Culture results may not be optimal due to an excessive volume of blood received in culture bottles   Culture      NO GROWTH < 24 HOURS Performed at [facility], [address], [place], [place] [address]    Report Status PENDING   Respiratory Panel by RT PCR (Flu A&B, Covid) - Nasopharyngeal Swab     Status: None   Collection Time: 10/26/19  2:28 PM   Specimen: Nasopharyngeal Swab  Result Value Ref Range   SARS Coronavirus 2 by RT PCR NEGATIVE NEGATIVE    Comment: (NOTE) SARS-CoV-2 target nucleic acids are NOT DETECTED. The SARS-CoV-2 RNA is generally detectable in upper respiratoy specimens during the acute phase of infection. The lowest concentration of SARS-CoV-2 viral copies this assay can detect is 131 copies/mL. A negative result does not preclude SARS-Cov-2 infection and should not be used as the sole basis for treatment or other patient management decisions. A negative result may occur with  improper specimen collection/handling, submission of specimen other than nasopharyngeal swab, presence of viral mutation(s) within the areas targeted by this assay, and inadequate number of viral copies (<131 copies/mL). A negative result must be combined with clinical observations, patient history, and epidemiological information. The expected result is Negative. Fact Sheet for Patients:  https://www.moore.com/ Fact Sheet for  Healthcare Providers:  https://www.young.biz/ This test is not yet ap proved or cleared by the [place] FDA and  has been authorized for detection and/or diagnosis of SARS-CoV-2 by FDA under an Emergency Use Authorization (EUA). This EUA will remain  in effect (meaning this test can be used) for the duration of the COVID-19 declaration under Section 564(b)(1) of the Act, 21 U.S.C. section 360bbb-3(b)(1), unless the authorization is terminated or revoked sooner.    Influenza A by PCR NEGATIVE NEGATIVE   Influenza B by PCR NEGATIVE NEGATIVE    Comment: (NOTE) The Xpert Xpress SARS-CoV-2/FLU/RSV assay is intended as an aid in  the diagnosis of influenza from Nasopharyngeal swab specimens and  should not be used as a sole basis for treatment. Nasal washings and  aspirates are unacceptable for Xpert Xpress SARS-CoV-2/FLU/RSV  testing. Fact Sheet for Patients: https://www.moore.com/ Fact Sheet for Healthcare Providers: https://www.young.biz/ This test is not yet approved or cleared by the [place] FDA and  has been authorized for detection and/or diagnosis of SARS-CoV-2 by  FDA under an Emergency Use Authorization (EUA). This EUA will remain  in effect (meaning this test can be used) for the duration of the  Covid-19 declaration under Section 564(b)(1) of the Act, 21  U.S.C. section 360bbb-3(b)(1), unless the authorization is  terminated or revoked. Performed at [facility], [address] [place], [place] [address]   Urinalysis, Complete w Microscopic     Status: Abnormal   Collection Time: 10/26/19  3:21 PM  Result Value Ref Range   Color, Urine YELLOW (A) YELLOW   APPearance CLOUDY (A) CLEAR   Specific Gravity, Urine 1.021 1.005 - 1.030   pH 5.0 5.0 - 8.0   Glucose, UA NEGATIVE NEGATIVE mg/dL   Hgb urine dipstick NEGATIVE NEGATIVE   Bilirubin Urine NEGATIVE NEGATIVE   Ketones, ur 20 (A) NEGATIVE  mg/dL   Protein, ur 30 (A) NEGATIVE mg/dL   Nitrite NEGATIVE NEGATIVE   Leukocytes,Ua NEGATIVE NEGATIVE   RBC / HPF 6-10 0 - 5 RBC/hpf   WBC, UA 0-5 0 - 5 WBC/hpf   Bacteria, UA NONE SEEN NONE SEEN   Squamous Epithelial / LPF 0-5 0 - 5   Mucus PRESENT     Comment: Performed at [facility], [address] [place], [place] [address]  Blood gas, arterial     Status: Abnormal   Collection Time: 10/26/19  6:38 PM  Result Value Ref Range   FIO2 0.28    Delivery systems NASAL CANNULA    pH, Arterial 7.34 (L) 7.350 - 7.450   pCO2 arterial 81 (HH) 32.0 - 48.0 mmHg    Comment: CRITICAL RESULT CALLED TO, READ BACK BY AND VERIFIED WITH: NOTIFIED TO DR.STAFFORD, P AT 1842 ON 10/26/2019 BY GKM    pO2, Arterial 66 (L) 83.0 - 108.0 mmHg   Bicarbonate 43.7 (H) 20.0 - 28.0 mmol/L   Acid-Base Excess 14.0 (H) 0.0 - 2.0 mmol/L   O2 Saturation 91.5 %   Patient temperature 37.0    Collection site RIGHT BRACHIAL    Sample type ARTERIAL DRAW    Allens test (pass/fail) PASS PASS    Comment:  Performed at [facility], [address] [place], [place] [address]  Lactic acid, plasma     Status: None   Collection Time: 10/26/19  9:56 PM  Result Value Ref Range   Lactic Acid, Venous 1.2 0.5 - 1.9 mmol/L    Comment: Performed at [facility], [address] [place], [place] [address]  HIV Antibody (routine testing w rflx)     Status: None   Collection Time: 10/26/19  9:56 PM  Result Value Ref Range   HIV Screen 4th Generation wRfx NON REACTIVE NON REACTIVE    Comment: Performed at [facility] [address] [place], [place] [address]    Current Facility-Administered Medications  Medication Dose Route Frequency Provider Last Rate Last Admin  . albuterol (PROVENTIL) (2.5 MG/3ML) 0.083% nebulizer solution 2.5 mg  2.5 mg Inhalation Q4H PRN Max Sane, MD   2.5 mg at 10/27/19 1223  . ALPRAZolam Duanne Moron) tablet 0.5 mg  0.5 mg Oral TID PRN Max Sane, MD   0.5 mg at  10/27/19 1300  . B-complex with vitamin C tablet 1 tablet  1 tablet Oral Daily Max Sane, MD   1 tablet at 10/27/19 0908  . budesonide (PULMICORT) nebulizer solution 0.5 mg  0.5 mg Nebulization BID Max Sane, MD      . buPROPion (WELLBUTRIN XL) 24 hr tablet 150 mg  150 mg Oral Daily Max Sane, MD   150 mg at 10/27/19 0908  . busPIRone (BUSPAR) tablet 10 mg  10 mg Oral TID Max Sane, MD   10 mg at 10/27/19 0908  . cholecalciferol (VITAMIN D3) tablet 2,000 Units  2,000 Units Oral Daily Max Sane, MD   2,000 Units at 10/27/19 0908  . doxycycline (VIBRA-TABS) tablet 100 mg  100 mg Oral Q12H Max Sane, MD   100 mg at 10/27/19 0908  . enoxaparin (LOVENOX) injection 40 mg  40 mg Subcutaneous Q24H Max Sane, MD   40 mg at 10/26/19 2119  . feeding supplement (ENSURE ENLIVE) (ENSURE ENLIVE) liquid 237 mL  237 mL Oral BID BM Manuella Ghazi, Vipul, MD      . furosemide (LASIX) tablet 20 mg  20 mg Oral PRN Max Sane, MD      . guaiFENesin (MUCINEX) 12 hr tablet 600 mg  600 mg Oral BID Max Sane, MD   600 mg at 10/27/19 0908  . methylPREDNISolone sodium succinate (SOLU-MEDROL) 125 mg/2 mL injection 60 mg  60 mg Intravenous Q6H Max Sane, MD   60 mg at 10/27/19 1217   Followed by  . [START ON 10/28/2019] predniSONE (DELTASONE) tablet 40 mg  40 mg Oral Q breakfast Max Sane, MD      . ondansetron (ZOFRAN-ODT) disintegrating tablet 8 mg  8 mg Oral Q8H PRN Max Sane, MD      . oxybutynin (DITROPAN-XL) 24 hr tablet 5 mg  5 mg Oral QHS Max Sane, MD   5 mg at 10/26/19 2118  . pantoprazole (PROTONIX) EC tablet 40 mg  40 mg Oral Daily Max Sane, MD   40 mg at 10/27/19 0908  . rosuvastatin (CRESTOR) tablet 5 mg  5 mg Oral Daily Max Sane, MD   5 mg at 10/27/19 0907  . sertraline (ZOLOFT) tablet 200 mg  200 mg Oral BID Max Sane, MD   200 mg at 10/27/19 0907  . umeclidinium-vilanterol (ANORO ELLIPTA) 62.5-25 MCG/INH 1 puff  1 puff Inhalation Daily Max Sane, MD   1 puff at 10/27/19 [phone]  Psychiatric Specialty Exam: Physical Exam  Review of Systems  Blood pressure 131/62, pulse 97, temperature 97.7 F (36.5 C), temperature source Oral, resp. rate 18, height 5\' 6"  (1.676 m), weight 63.5 kg, SpO2 92 %.Body mass index is 22.6 kg/m.  General Appearance: Casual  Eye Contact:  Good  Speech:  Normal Rate  Volume:  Normal  Mood:  Euthymic  Affect:  Congruent  Thought Process:  Coherent, Goal Directed and Linear  Orientation:  Full (Time, Place, and Person)  Thought Content:  Logical  Suicidal Thoughts:  No  Homicidal Thoughts:  No  Memory:  Immediate;   Fair Recent;   Fair  Judgement:  Fair  Insight:  Fair  Psychomotor Activity:  Normal  Concentration:  Concentration: Fair and Attention Span: Fair  Recall:  FiservFair  Fund of Knowledge:  Fair  Language:  Good  Akathisia:  No  Handed:  Right  AIMS (if indicated):     Assets:  Communication Skills Desire for Improvement Housing Social Support  ADL's:  Intact  Cognition:  WNL  Sleep:        Treatment Plan Summary: Medication management  Disposition: No evidence of imminent risk to self or others at present.   Patient does not meet criteria for psychiatric inpatient admission.   73 y.o. female patient a with a history of anxiety, depression, COPD, diabetes, cardiomyopathy presented in ER yesterday ago with altered mental status and worsening weakness over the last 3 days, home health nurse noted patient to be lethargic with increased work of breathing. Patient was admitted to a medical floor. Psychiatry consulted for anxiety.  On interview, patient reports increased anxiety in settings of worsened medical condition. No current safety concerns.  Impression: Generalized anxiety disorder. Major depressive disorder, per history.  Recommendations: -No indication for inpatient psychiatric hospitalization.  -psych medications:  continue Xanax 0.5mg  PO TID PRN anxiety; we discussed with the patient risks of  being on benzos in her age, she is willing to get off the medication eventually, but states she in not ready for that now due to recently increased anxiety; patient instructed to try decrease the dose to twice daily.  continue Buspirone 10mg  PO TID for anxiety  discontinue Wellbutrin, as patient reported she is not taking this medication at home anymore and the medication can make anxiety worse.  restart Sertraline 100mg  PO daily for anxiety and depression, as the patient states this is her current home medication.  -Follow-up with outpatient mental health provider.     Thalia PartyAlisa Amaury Kuzel, MD 10/27/2019 1:24 PM

## 2019-10-27 NOTE — Progress Notes (Signed)
Initial Nutrition Assessment  RD working remotely.  DOCUMENTATION CODES:   Not applicable  INTERVENTION:  Continue Ensure Enlive po BID, each supplement provides 350 kcal and 20 grams of protein.  Encouraged adequate intake of calories and protein from meals and ONS.  NUTRITION DIAGNOSIS:   Increased nutrient needs related to catabolic illness(COPD) as evidenced by estimated needs.  GOAL:   Patient will meet greater than or equal to 90% of their needs  MONITOR:   PO intake, Supplement acceptance, Labs, Weight trends, I & O's  REASON FOR ASSESSMENT:   Consult Assessment of nutrition requirement/status  ASSESSMENT:   73 year old female with PMHx of COPD, diverticulosis/diverticulitis, DM, sleep apnea, anxiety, HLD, stress-induced cardiomyopathy, HTN, depression admitted with acute exacerbation of COPD.   Spoke with patient over the phone. She reports her appetite is good and unchanged from baseline. She eats 2-3 meals per day and tries to include high-protein items such as [place] yogurt in her diet. She reports someone comes to her home 4 days per week to prepare meals for her. Discussed increased needs for calories and protein related to COPD. Patient has been ordered for Ensure Enlive BID already and she reports she is drinking them.  Patient reports she has not been losing weight but then also reports she does not weigh herself usually. According to chart she has been documented at 66-73 kg recently so unsure how accurate that is. She is currently documented to be 63.5 kg (140 lbs) but this appears to be a stated weight.  Medications reviewed and include: B-complex with C daily, vitamin D3 2000 units daily, doxycycline, Solu-Medrol 60 mg Q6hrs IV today then prednisone 40 mg daily starting tomorrow, pantoprazole, sertraline.  Labs reviewed: Chloride 96, CO2 35, Creatinine 0.43.  Unable to determine if patient meets criteria for malnutrition at this time without completing  NFPE.  NUTRITION - FOCUSED PHYSICAL EXAM:  Unable to complete at this time.  Diet Order:   Diet Order            Diet regular Room service appropriate? Yes; Fluid consistency: Thin  Diet effective now             EDUCATION NEEDS:   Education needs have been addressed  Skin:  Skin Assessment: Reviewed RN Assessment  Last BM:  10/27/2019 per chart  Height:   Ht Readings from Last 1 Encounters:  10/26/19 5\' 6"  (1.676 m)   Weight:   Wt Readings from Last 1 Encounters:  10/26/19 63.5 kg   Ideal Body Weight:  59.1 kg  BMI:  Body mass index is 22.6 kg/m.  Estimated Nutritional Needs:   Kcal:  1500-1700  Protein:  75-85 grams  Fluid:  1.5-1.7 L/day  Jacklynn Barnacle, MS, RD, LDN Office: [phone] Pager: [phone] After Hours/Weekend Pager: [phone]

## 2019-10-27 NOTE — TOC Initial Note (Signed)
Transition of Care [facility]) - Initial/Assessment Note    Patient Details  Name: Tami Skinner MRN: [id number] Date of Birth: 02-Jun-1946  Transition of Care [facility]) CM/SW Contact:    Su Hilt, RN Phone Number: 10/27/2019, 12:09 PM  Clinical Narrative:                 Met with the patient and her husband in the room to discuss DC plan and needs She wants to go home with [facility]. I let her know that OT was recommending SNF, She said she really wants to go home and not to SNF.   Her husband stated that when [facility] comes she does not do what she needs to do and tells them she does not feel like it, I encouraged her to work with [facility] services doing what they ask her to do so that she can get stronger.  She said she has problems with her stomach and that her PCP was setting up an outpatient appointment to check on it  She stated that she would work with [facility] as she is supposed to and she wants to go home.  I let them know if they change their mind and want to go to SNF to let CM know  Expected Discharge Plan: [place] Barriers to Discharge: Continued Medical Work up   Patient Goals and CMS Choice Patient states their goals for this hospitalization and ongoing recovery are:: go home      Expected Discharge Plan and Services Expected Discharge Plan: [place]   Discharge Planning Services: CM Consult   Living arrangements for the past 2 months: Single Family Home                 DME Arranged: N/A         HH Arranged: PT, OT HH Agency: [facility] Date [place]: 10/27/19 Time [place]: 1208 Representative spoke with at [place]: Cassie  Prior Living Arrangements/Services Living arrangements for the past 2 months: [place] with:: Spouse Patient language and need for interpreter reviewed:: Yes Do you feel safe going back to the place where you live?: Yes       Need for Family Participation in Patient Care: No (Comment) Care giver support system in place?: Yes (comment) Current home services: DME(scooter, RW) Criminal Activity/Legal Involvement Pertinent to Current Situation/Hospitalization: No - Comment as needed  Activities of Daily Living Home Assistive Devices/Equipment: None ADL Screening (condition at time of admission) Patient's cognitive ability adequate to safely complete daily activities?: Yes Is the patient deaf or have difficulty hearing?: No Does the patient have difficulty seeing, even when wearing glasses/contacts?: No Does the patient have difficulty concentrating, remembering, or making decisions?: Yes Patient able to express need for assistance with ADLs?: Yes Does the patient have difficulty dressing or bathing?: No Independently performs ADLs?: Yes (appropriate for developmental age) Does the patient have difficulty walking or climbing stairs?: Yes Weakness of Legs: Both Weakness of Arms/Hands: None  Permission Sought/Granted   Permission granted to share information with : Yes, Verbal Permission Granted              Emotional Assessment Appearance:: Appears stated age Attitude/Demeanor/Rapport: Engaged Affect (typically observed): Appropriate Orientation: : Oriented to Self, Oriented to Place, Oriented to  Time, Oriented to Situation Alcohol / Substance Use: Not Applicable Psych Involvement: No (comment)  Admission diagnosis:  Chronic  obstructive pulmonary disease with acute exacerbation (HCC) [J44.1] Acute respiratory failure with hypoxemia (HCC) [J96.01] Acute on chronic respiratory failure with hypoxia and hypercapnia (HCC) [G64.40, J96.22] Patient Active Problem List   Diagnosis Date Noted  . Acute respiratory failure with hypoxemia ([place]) 10/26/2019  . Acute on chronic respiratory failure with hypoxia and hypercapnia (HCC)   . NSTEMI (non-ST elevated myocardial infarction) ([place]) 10/06/2014  .  Stress-induced cardiomyopathy   . Dyshidrotic eczema 10/11/2013  . BPV (benign positional vertigo) 08/02/2013  . Essential hypertension, benign 03/31/2013  . Solitary pulmonary nodule 12/20/2012  . Postmenopausal estrogen deficiency 09/07/2012  . Anxiety and depression 11/24/2011  . Diabetes mellitus type 2, controlled ([place]) 09/04/2011  . COPD (chronic obstructive pulmonary disease) ([place]) 09/04/2011  . OSA (obstructive sleep apnea) 02/17/2007  . Hyperlipidemia 04/04/2003   PCP:  Jearld Fenton, NP Pharmacy:   [id number] [place] [id number]: [phone]: [phone]    Social Determinants of Health (SDOH) Interventions    Readmission Risk Interventions No flowsheet data found.

## 2019-10-27 NOTE — TOC Progression Note (Signed)
Transition of Care [facility]) - Progression Note    Patient Details  Name: Tami Skinner MRN: [id number] Date of Birth: 07-20-1946  Transition of Care [facility]) CM/SW [place], RN Phone Number: 10/27/2019, 11:47 AM  Clinical Narrative:     [facility] [place] Must office is closed, error message states information does not match theirs, unable to get corrected       Expected Discharge Plan and Services                                                 Social Determinants of Health (SDOH) Interventions    Readmission Risk Interventions No flowsheet data found.

## 2019-10-27 NOTE — Progress Notes (Signed)
Pt is becoming anxious and impulsive. Continues to try to get out of bed and is unsafe. Pt is confused and seeing men in the window. Dr. Manuella Ghazi made aware and new order for ativan IV once. No effect. MD made aware and RN instructed to give PO xanax early.

## 2019-10-27 NOTE — Progress Notes (Addendum)
1        [place] [facility] [place] NAME: Tami Skinner    MR#:  [id number]  DATE OF BIRTH:  01-07-1946  SUBJECTIVE:  CHIEF COMPLAINT:   Chief Complaint  Patient presents with  . Weakness  Feels anxious, still requiring 4 L oxygen, wants to leave although recognizes that she is not stable to go home like this REVIEW OF SYSTEMS:  Review of Systems  Constitutional: Positive for malaise/fatigue. Negative for diaphoresis, fever and weight loss.  HENT: Negative for ear discharge, ear pain, hearing loss, nosebleeds, sore throat and tinnitus.   Eyes: Negative for blurred vision and pain.  Respiratory: Positive for shortness of breath. Negative for cough, hemoptysis and wheezing.   Cardiovascular: Negative for chest pain, palpitations, orthopnea and leg swelling.  Gastrointestinal: Negative for abdominal pain, blood in stool, constipation, diarrhea, heartburn, nausea and vomiting.  Genitourinary: Negative for dysuria, frequency and urgency.  Musculoskeletal: Negative for back pain and myalgias.  Skin: Negative for itching and rash.  Neurological: Negative for dizziness, tingling, tremors, focal weakness, seizures, weakness and headaches.  Psychiatric/Behavioral: Positive for depression. The patient is nervous/anxious.     DRUG ALLERGIES:   Allergies  Allergen Reactions  . Amoxicillin-Pot Clavulanate     REACTION: stomach sensitive.  [place] [Roflumilast]     Severe nausea   . Erythromycin Base     REACTION: GI upset (cramping and diarrhea)  . Neurontin [Gabapentin]     anxiety   VITALS:  Blood pressure 131/62, pulse 97, temperature 97.7 F (36.5 C), temperature source Oral, resp. rate 18, height 5\' 6"  (1.676 m), weight 63.5 kg, SpO2 92 %. PHYSICAL EXAMINATION:  Physical Exam HENT:     Head: Normocephalic and atraumatic.  Eyes:     Conjunctiva/sclera: Conjunctivae normal.     Pupils: Pupils are equal, round, and reactive to light.  Neck:     Thyroid: No  thyromegaly.     Trachea: No tracheal deviation.  Cardiovascular:     Rate and Rhythm: Normal rate and regular rhythm.     Heart sounds: Normal heart sounds.  Pulmonary:     Effort: Pulmonary effort is normal. No respiratory distress.     Breath sounds: Examination of the right-lower field reveals rhonchi. Examination of the left-lower field reveals rhonchi. Decreased breath sounds and rhonchi present. No wheezing.  Chest:     Chest wall: No tenderness.  Abdominal:     General: Bowel sounds are normal. There is no distension.     Palpations: Abdomen is soft.     Tenderness: There is no abdominal tenderness.  Musculoskeletal:        General: Normal range of motion.     Cervical back: Normal range of motion and neck supple.  Skin:    General: Skin is warm and dry.     Findings: No rash.  Neurological:     Mental Status: She is alert and oriented to person, place, and time.     Cranial Nerves: No cranial nerve deficit.  Psychiatric:        Mood and Affect: Mood is anxious.    LABORATORY PANEL:  Female CBC Recent Labs  Lab 10/26/19 1345  WBC 7.2  HGB 12.5  HCT 42.1  PLT 234   ------------------------------------------------------------------------------------------------------------------ Chemistries  Recent Labs  Lab 10/26/19 1345  NA 142  K 4.2  CL 96*  CO2 35*  GLUCOSE 124*  BUN 16  CREATININE 0.43*  CALCIUM 9.6  MG 1.8  AST 17  ALT 17  ALKPHOS 55  BILITOT 0.6   RADIOLOGY:  DG Chest Port 1 View  Result Date: 10/26/2019 CLINICAL DATA:  73 year old female with increasing weakness and altered mental status. EXAM: PORTABLE CHEST 1 VIEW COMPARISON:  Chest radiographs 09/22/2019 and earlier. FINDINGS: Portable AP upright view at 1356 hours. Stable lung volumes and mediastinal contours. Visualized tracheal air column is within normal limits. No pneumothorax, pulmonary edema, pleural effusion or confluent pulmonary opacity. Stable chronic increased upper lobe  interstitial markings. No acute osseous abnormality identified. IMPRESSION: No acute cardiopulmonary abnormality. Electronically Signed   By: Odessa Fleming M.D.   On: 10/26/2019 14:21   ASSESSMENT AND PLAN:   73 yo female DNR/DNI admitted with acute on chronic hypercapnic hypoxic respiratory failure secondary to AECOPD requiring Bipap  * Acute on chronic hypoxic hypercapnic respiratory failure secondary to AECOPD Hx: OSA and Asthmatic Bronchitis Continue IV and nebulized steroids  Scheduled and prn bronchodilator therapy Empiric doxycycline  * Hypertension  Hx: Chronic Diastolic CHF, Hyperlipidemia and Stress-Induced Cardiomyopathy  Continue outpatient cardiac medications  -We will change Lasix to 40 mg oral twice daily to diurese her  Type II Diabetes Mellitus  CBG's ac/hs  SSI -diabetic nurse consult  GERD Continue po protonix   Depression/Anxiety  Continue zoloft, BuSpar, Wellbutrin and prn xanax  -She feels very anxious and requesting psychiatry to evaluate her -Placed psychiatry consult   Covid and influenza A/B is negative   PT/OT recommended SNF but patient is refusing and want to go home with home health  Await palliative care c/s  Overall poor prognosis. she will benefit from outpatient palliative care to follow with  All the records are reviewed and case discussed with Care Management/Social Worker. Management plans discussed with the patient, nursing and they are in agreement.  CODE STATUS: DNR  TOTAL TIME TAKING CARE OF THIS PATIENT: 35 minutes.   More than 50% of the time was spent in counseling/coordination of care: YES  POSSIBLE D/C IN 1-2 DAYS, DEPENDING ON CLINICAL CONDITION.   Delfino Lovett M.D on 10/27/2019 at 1:35 PM  Between 7am to 6pm - Pager - [phone]  After 6pm go to [email or web address] - password [email or web address]  [facility]   CC: Primary care physician; Lorre Munroe, NP  Note: This dictation was prepared with Dragon dictation along  with smaller phrase technology. Any transcriptional errors that result from this process are unintentional.

## 2019-10-27 NOTE — Evaluation (Signed)
Occupational Therapy Evaluation Patient Details Name: Tami Skinner MRN: [id number] DOB: [birth year] Today's Date: 10/27/2019    History of Present Illness 73 y.o. female with a known history of COPD (on 2-3L O2 at home), diabetes, cardiomyopathy presented to ED for altered mental status and weakness. Per chart review patient reported worsening weakness over the last 3 days, home health nurse noted patient to be lethargic today with increased work of breathing.  When I evaluated the patient she is lethargic and is on BiPAP. Now off BiPAP.   Clinical Impression   Pt seen for OT evaluation this date. Per pt, pt requires some assistance with ADL management at baseline including using a 4WW for all household mobility (pt does not leave the home), and receiving physical assistance for bathing and lower body dressing. Pt lives in a 1 level home with her spouse and has a PCA who provides assistance 6 hours/day 4 days/week. Pt on 2-3 liters of O2 at home. Pt reports becoming easily fatigued or out of breath with minimal exertion. Pt currently requires minimal to moderate assist for LB ADL and rolling in bed for bed level toileting tasks. On 4L O2 during session. O2 sats >95% throughout session. Pt demo's impairments in activity tolerance, abdominal discomfort with sitting, strength, need for increased O2, mild anxiety during session requiring cues to redirect to task, and pt unsure why she is here. Pt educated in energy conservation conservation strategies including pursed lip breathing and activity pacing. Pt verbalized understanding but would benefit from additional skilled OT services to maximize recall and carryover of learned techniques and facilitate implementation of learned techniques into daily routines. Upon discharge, recommend SNF. Pt with recent admission and high risk for readmission.       Follow Up Recommendations  SNF    Equipment Recommendations  None recommended by OT    Recommendations  for Other Services       Precautions / Restrictions Precautions Precautions: Fall Precaution Comments: watch O2 sats with exertion Restrictions Weight Bearing Restrictions: No      Mobility Bed Mobility Overal bed mobility: Needs Assistance Bed Mobility: Rolling Rolling: Min assist;Mod assist         General bed mobility comments: min-mod assist for log roll in bed to assist in linens change  Transfers                 General transfer comment: deferred, pt declined, a little anxious    Balance Overall balance assessment: History of Falls                                         ADL either performed or assessed with clinical judgement   ADL Overall ADL's : Needs assistance/impaired                                       General ADL Comments: Currently requires min-mod assist for LB ADL     Vision Baseline Vision/History: Wears glasses Wears Glasses: At all times Patient Visual Report: No change from baseline       Perception     Praxis      Pertinent Vitals/Pain Pain Assessment: 0-10 Pain Score: 6  Pain Location: abdominal pain when attempting to sit up Pain Descriptors / Indicators: Aching Pain Intervention(s): Limited activity within patient's  tolerance;Monitored during session;Repositioned     Hand Dominance Right   Extremity/Trunk Assessment Upper Extremity Assessment Upper Extremity Assessment: Generalized weakness(at least 4-/5)   Lower Extremity Assessment Lower Extremity Assessment: Generalized weakness(at least 3+/5)       Communication Communication Communication: No difficulties   Cognition Arousal/Alertness: Awake/alert Behavior During Therapy: Anxious Overall Cognitive Status: No family/caregiver present to determine baseline cognitive functioning                                 General Comments: alert and oriented x3 (unclear why here), follows commands, anxious, cues to  redirect at times   General Comments  4L O2, O2 sats 95%, HR 98    Exercises Other Exercises Other Exercises: Pt educated in falls prevention, pursed lip breathing and activity pacing   Shoulder Instructions      Home Living Family/patient expects to be discharged to:: Private residence Living Arrangements: Spouse/significant other Available Help at Discharge: Family;Personal care attendant;Available 24 hours/day Type of Home: House Home Access: Ramped entrance     Home Layout: One level     Bathroom Shower/Tub: Walk-in shower;Door(Pt has been sponge bathing for several montsh 2/2 signifcant fear of falling in shower. Husband has completely retrofitted bathroom to support her safety.)   Bathroom Toilet: Handicapped height Bathroom Accessibility: Yes   Home Equipment: Walker - 4 wheels;Shower seat - built in;Cane - single point;Grab bars - toilet          Prior Functioning/Environment Level of Independence: Needs assistance  Gait / Transfers Assistance Needed: Per pt, pt uses a 4WW for all functional mobility within the home. 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 Pt endorses at least 2 falls in the past year. ADL's / Homemaking Assistance Needed: Pt reqires assistance from her spouse/PCA for dressing and sponge bathing. Per pt, pt has significant fear of falling in the bathroom, she primarily uses a BSC set next to her couch/recliner where she also sleeps. Husband manages most IADLs, pt independent with medication management.            OT Problem List: Decreased strength;Decreased activity tolerance;Decreased knowledge of use of DME or AE;Impaired balance (sitting and/or standing);Cardiopulmonary status limiting activity      OT Treatment/Interventions: Self-care/ADL training;Therapeutic exercise;Therapeutic activities;DME and/or AE instruction;Patient/family education;Energy conservation;Balance training    OT Goals(Current goals can be found in the care plan section) Acute  Rehab OT Goals Patient Stated Goal: go home and get better OT Goal Formulation: With patient Time For Goal Achievement: 11/10/19 Potential to Achieve Goals: Good ADL Goals Pt Will Perform Lower Body Dressing: with adaptive equipment;sit to/from stand;with caregiver independent in assisting Pt Will Transfer to Toilet: with min guard assist;ambulating;bedside commode(LRAD for amb) Additional ADL Goal #1: Pt will verbalize plan to implement at least 2 learned ECS to support safety/indep with ADL at home and minimize SOB.  OT Frequency: Min 1X/week   Barriers to D/C:            Co-evaluation              AM-PAC OT "6 Clicks" Daily Activity     Outcome Measure Help from another person eating meals?: None Help from another person taking care of personal grooming?: A Little Help from another person toileting, which includes using toliet, bedpan, or urinal?: A Lot Help from another person bathing (including washing, rinsing, drying)?: A Lot Help from another person to put on and taking off  regular upper body clothing?: A Little Help from another person to put on and taking off regular lower body clothing?: A Lot 6 Click Score: 16   End of Session    Activity Tolerance: Patient tolerated treatment well Patient left: in bed;with call bell/phone within reach;with bed alarm set;with SCD's reapplied  OT Visit Diagnosis: Other abnormalities of gait and mobility (R26.89);Repeated falls (R29.6);Muscle weakness (generalized) (M62.81)                Time: [id number] OT Time Calculation (min): 37 min Charges:  OT General Charges $OT Visit: 1 Visit OT Evaluation $OT Eval Moderate Complexity: 1 Mod OT Treatments $Self Care/Home Management : 23-37 mins  Richrd PrimeJamie Stiller, MPH, MS, OTR/L ascom [phone] 10/27/19, 11:43 AM

## 2019-10-27 NOTE — Evaluation (Signed)
Physical Therapy Evaluation Patient Details Name: Tami Skinner MRN: [id number] DOB: 1945/11/18 Today's Date: 10/27/2019   History of Present Illness  73 y.o. female with a known history of COPD (on 2-3L O2 at home), diabetes, cardiomyopathy presented to ED for altered mental status and weakness. Per chart review patient reported worsening weakness over the last 3 days, home health nurse noted patient to be lethargic today with increased work of breathing.  When I evaluated the patient she is lethargic and is on BiPAP. Now off BiPAP.  Clinical Impression  Pt is at first anxious to do a lot with PT but was able to participate reasonably well with extra encouragement and cuing. She was on 4L O2 on arrival with HR <100, with only minimal activity (transfer to standing and turn to recliner) her O2 dropped to mid 80s and HR was in 120s with dyspnea and considerable fatigue.  Overall she was functionally limited and though it appears she does not necessarily do a whole lot at home regarding mobility she is weaker and quicker to fatigue at this time.     Follow Up Recommendations SNF    Equipment Recommendations  None recommended by PT    Recommendations for Other Services       Precautions / Restrictions Precautions Precautions: Fall Precaution Comments: watch O2 sats with exertion Restrictions Weight Bearing Restrictions: No      Mobility  Bed Mobility Overal bed mobility: Needs Assistance Bed Mobility: Supine to Sit Rolling: Min assist;Mod assist   Supine to sit: Min assist     General bed mobility comments: Pt hesitant to get up/do a lot but she was able to give most of the effort in getting to EOB.  Transfers Overall transfer level: Needs assistance Equipment used: Rolling walker (2 wheeled) Transfers: Sit to/from Stand Sit to Stand: Mod assist;Min assist         General transfer comment: Pt unable to rise on her own power, needed phyiscal assist to attain and maintain  standing.  Ambulation/Gait Ambulation/Gait assistance: Min assist Gait Distance (Feet): 3 Feet Assistive device: Rolling walker (2 wheeled)       General Gait Details: Pt did poorly with standing showing heavy reliance on the walker, inability to coordinate shuffling steps efficiently and overall showed poor tolerance.  Pt on 4L O2 with sats dropping from mid/low 90s to 86% and HR increasing to 120s.  Stairs            Wheelchair Mobility    Modified Rankin (Stroke Patients Only)       Balance Overall balance assessment: History of Falls;Needs assistance Sitting-balance support: Single extremity supported Sitting balance-Leahy Scale: Fair     Standing balance support: Bilateral upper extremity supported Standing balance-Leahy Scale: Poor Standing balance comment: Pt with poor tolerance with standing, unsteady and limited with activity                             Pertinent Vitals/Pain Pain Assessment: Faces Pain Score: 6  Faces Pain Scale: Hurts little more Pain Location: reports abdominal pain/upset stomach Pain Descriptors / Indicators: Aching Pain Intervention(s): Limited activity within patient's tolerance;Monitored during session;Repositioned    Home Living Family/patient expects to be discharged to:: Private residence Living Arrangements: Spouse/significant other(she reports "a room mate") Available Help at Discharge: Family;Personal care attendant;Available 24 hours/day Type of Home: House Home Access: Ramped entrance     Home Layout: One level Home Equipment: Walker - 4  wheels;Shower seat - built in;Cane - single point;Grab bars - toilet      Prior Function Level of Independence: Needs assistance   Gait / Transfers Assistance Needed: Per pt, pt uses a 4WW for all functional mobility within the home. She is not a Tourist information centre managercommunity ambulator. Pt endorses at least 2 falls in the past year.  ADL's / Homemaking Assistance Needed: Pt reqires assistance  from her spouse/PCA for dressing and sponge bathing. Per pt, pt has significant fear of falling in the bathroom, she primarily uses a BSC set next to her couch/recliner where she also sleeps. Husband manages most IADLs, pt independent with medication management.        Hand Dominance   Dominant Hand: Right    Extremity/Trunk Assessment   Upper Extremity Assessment Upper Extremity Assessment: Overall WFL for tasks assessed;Generalized weakness    Lower Extremity Assessment Lower Extremity Assessment: Overall WFL for tasks assessed;Generalized weakness       Communication   Communication: No difficulties  Cognition Arousal/Alertness: Awake/alert Behavior During Therapy: Anxious Overall Cognitive Status: No family/caregiver present to determine baseline cognitive functioning                                 General Comments: multiple wrong guesses for birth year, stated today's date was Jan 24. 2020 - did not get correct date with cuing.      General Comments General comments (skin integrity, edema, etc.): Pt anxious and needing extra cuing/encouragment t/o session    Exercises Other Exercises Other Exercises: Pt educated in falls prevention, pursed lip breathing and activity pacing   Assessment/Plan    PT Assessment Patient needs continued PT services  PT Problem List Decreased strength;Decreased range of motion;Decreased balance;Decreased mobility;Pain;Decreased knowledge of use of DME;Decreased safety awareness;Decreased cognition;Decreased activity tolerance       PT Treatment Interventions DME instruction;Gait training;Stair training;Therapeutic activities;Therapeutic exercise;Functional mobility training;Balance training;Neuromuscular re-education;Cognitive remediation;Patient/family education    PT Goals (Current goals can be found in the Care Plan section)  Acute Rehab PT Goals Patient Stated Goal: pt resigned to the  PT Goal Formulation: With  patient Time For Goal Achievement: 11/10/19 Potential to Achieve Goals: Fair    Frequency Min 2X/week   Barriers to discharge        Co-evaluation               AM-PAC PT "6 Clicks" Mobility  Outcome Measure Help needed turning from your back to your side while in a flat bed without using bedrails?: A Little Help needed moving from lying on your back to sitting on the side of a flat bed without using bedrails?: A Little Help needed moving to and from a bed to a chair (including a wheelchair)?: A Lot Help needed standing up from a chair using your arms (e.g., wheelchair or bedside chair)?: A Lot Help needed to walk in hospital room?: Total Help needed climbing 3-5 steps with a railing? : Total 6 Click Score: 12    End of Session Equipment Utilized During Treatment: Gait belt Activity Tolerance: Patient tolerated treatment well Patient left: with chair alarm set;with call bell/phone within reach Nurse Communication: Mobility status(vitals) PT Visit Diagnosis: Unsteadiness on feet (R26.81);Muscle weakness (generalized) (M62.81);History of falling (Z91.81);Difficulty in walking, not elsewhere classified (R26.2)    Time: [id number] PT Time Calculation (min) (ACUTE ONLY): 28 min   Charges:   PT Evaluation $PT Eval Low Complexity: 1 Low PT Treatments $  Therapeutic Activity: 8-22 mins        Malachi Pro, DPT 10/27/2019, 3:20 PM

## 2019-10-28 ENCOUNTER — Encounter: Payer: Self-pay | Admitting: Internal Medicine

## 2019-10-28 DIAGNOSIS — Z515 Encounter for palliative care: Secondary | ICD-10-CM

## 2019-10-28 DIAGNOSIS — Z7189 Other specified counseling: Secondary | ICD-10-CM

## 2019-10-28 DIAGNOSIS — R410 Disorientation, unspecified: Secondary | ICD-10-CM

## 2019-10-28 LAB — BASIC METABOLIC PANEL
Anion gap: 15 (ref 5–15)
BUN: 22 mg/dL (ref 8–23)
CO2: 37 mmol/L — ABNORMAL HIGH (ref 22–32)
Calcium: 10 mg/dL (ref 8.9–10.3)
Chloride: 90 mmol/L — ABNORMAL LOW (ref 98–111)
Creatinine, Ser: 0.87 mg/dL (ref 0.44–1.00)
GFR calc Af Amer: >60 mL/min (ref >60)
GFR calc non Af Amer: >60 mL/min (ref >60)
Glucose, Bld: 166 mg/dL — ABNORMAL HIGH (ref 70–99)
Potassium: 3.4 mmol/L — ABNORMAL LOW (ref 3.5–5.1)
Sodium: 142 mmol/L (ref 135–145)

## 2019-10-28 LAB — CBC
HCT: 36.8 % (ref 36.0–46.0)
Hemoglobin: 11.6 g/dL — ABNORMAL LOW (ref 12.0–15.0)
MCH: 28.4 pg (ref 26.0–34.0)
MCHC: 31.5 g/dL (ref 30.0–36.0)
MCV: 90 fL (ref 80.0–100.0)
Platelets: 264 10*3/uL (ref 150–400)
RBC: 4.09 MIL/uL (ref 3.87–5.11)
RDW: 13.4 % (ref 11.5–15.5)
WBC: 7.8 10*3/uL (ref 4.0–10.5)
nRBC: 0 % (ref 0.0–0.2)

## 2019-10-28 LAB — URINE CULTURE

## 2019-10-28 MED ORDER — METOPROLOL TARTRATE 25 MG PO TABS
25.0000 mg | ORAL_TABLET | Freq: Two times a day (BID) | ORAL | Status: DC
  Administered 2019-10-28 – 2019-10-29 (×2): 25 mg via ORAL
  Filled 2019-10-28 (×3): qty 1

## 2019-10-28 MED ORDER — QUETIAPINE FUMARATE 25 MG PO TABS
25.0000 mg | ORAL_TABLET | Freq: Every evening | ORAL | Status: DC | PRN
  Administered 2019-10-28: 25 mg via ORAL
  Filled 2019-10-28: qty 1

## 2019-10-28 NOTE — Consult Note (Signed)
Consultation Note Date: 10/28/2019   Patient Name: Tami Skinner  DOB: 10-Apr-1946  MRN: [id number]  Age / Sex: 73 y.o., female  PCP: Tami Munroe, NP Referring Physician: Delfino Lovett, MD  Reason for Consultation: Establishing goals of care and Psychosocial/spiritual support  HPI/Patient Profile: 73 y.o. female  with past medical history of COPD, diabetes, cardiomyopathy, asthmatic bronchitis, diverticulosis, HTN/HLD, sleep apnea, anxiety and depression admitted on 10/26/2019 with acute on chronic hypoxic/hypercapnic respiratory failure secondary to COPD exacerbation.   Clinical Assessment and Goals of Care: Tami Skinner is resting quietly in bed.  She greets me making and somewhat keeping eye contact.  She appears chronically ill and frail.  She is alert and oriented, able to make her needs known.  Present today at bedside as husband, Tami Skinner.  We talked about her acute and chronic health conditions.  We also talked about her home life.  Tami Skinner states that she is sleeps on the couch, and he has encouraged her to sleep on the bed, has even bought her a lift chair which she declines to use.  He shares that he feels like there are small dogs keep her awake most of the night.  They both share that she does sleep sometimes during the day for 2 to 3-hour naps.  They share that she has had home health services in the past, but does not enjoy participating due to being short of breath.  We talked about the chronic illness pathway, what is normal and expected.  We talked about symptom management for anxiety and breathlessness.  We also talked about in-home services with hospice.  We talked about treat the treatable care, developing relationships with hospice providers, we also talked about equipment needs (none noted at this time).  Tami Skinner and Tami Skinner shared that their goal is for her to remain in her home.  He is  willing to set up more caregivers.  They both share that she does best at home.  I encourage them that hospice will keep that same goal, to treat her at her own home.  Choice of provider offered, they accept [facility].  I share that the medical team will work to set up services, Tami Skinner will be called with particulars.  HC POA and advanced directives discussed, see below.   HCPOA    NEXT OF KIN - spouse Tami Skinner   SUMMARY OF RECOMMENDATIONS   Home with "treat the treatable" hospice care.  Choice of provider offered, they accept [facility]. Goals is do not rehospitalize  Code Status/Advance Care Planning:  DNR -  We talk about code status and "treat the treatable" care.   Symptom Management:   Per hospitalist, no additional needs at this time.  Palliative Prophylaxis:   Frequent Pain Assessment  Additional Recommendations (Limitations, Scope, Preferences):  Treat the treatable but no CPR, no intubation   Psycho-social/Spiritual:   Desire for further Chaplaincy support:no  Additional Recommendations: Caregiving  Support/Resources and Education on Hospice  Prognosis:   Unable to determine, based  on outcomes, poor overall prognosis.  6 months or less would not be surprising based on chronic illness burden, declining functional status, and recent weight loss from 70 to 64 kg.Marland Kitchen  Discharge Planning: Qualified for rehab, desires home with home health      Primary Diagnoses: Present on Admission: . Acute respiratory failure with hypoxemia ([place])   I have reviewed the medical record, interviewed the patient and family, and examined the patient. The following aspects are pertinent.  Past Medical History:  Diagnosis Date  . Anxiety   . Asthmatic bronchitis   . COPD (chronic obstructive pulmonary disease) ([place])   . Depression   . Diabetes mellitus   . Diverticulitis   . Diverticulosis   . Hyperlipidemia   . Hypertension   . Osteopenia   . Sleep apnea   .  Stress-induced cardiomyopathy    a. echo 09/28/2014: EF 45-50%, severe HK of mid-distal anterior, apical and mid-distal inferior walls suggestive of stress induced CM, mildly dilated LA, mildly dilated PASP, mild TR, trivial pericardial effusion   Social History   Socioeconomic History  . Marital status: Married    Spouse name: Not on file  . Number of children: Not on file  . Years of education: Not on file  . Highest education level: Not on file  Occupational History  . Not on file  Tobacco Use  . Smoking status: Former Smoker    Packs/day: 0.25    Years: 40.00    Pack years: 10.00    Types: Cigarettes    Quit date: 09/01/2016    Years since quitting: 3.1  . Smokeless tobacco: Never Used  . Tobacco comment: Uses nicotrol inhaler on occasion  Substance and Sexual Activity  . Alcohol use: No  . Drug use: No  . Sexual activity: Not Currently  Other Topics Concern  . Not on file  Social History Narrative   Lives with husband in [place]. 2 dogs. Works at [employer].   Social Determinants of Health   Financial Resource Strain:   . Difficulty of Paying Living Expenses: Not on file  Food Insecurity:   . Worried About Charity fundraiser in the Last Year: Not on file  . Ran Out of Food in the Last Year: Not on file  Transportation Needs:   . Lack of Transportation (Medical): Not on file  . Lack of Transportation (Non-Medical): Not on file  Physical Activity:   . Days of Exercise per Week: Not on file  . Minutes of Exercise per Session: Not on file  Stress:   . Feeling of Stress : Not on file  Social Connections:   . Frequency of Communication with Friends and Family: Not on file  . Frequency of Social Gatherings with Friends and Family: Not on file  . Attends Religious Services: Not on file  . Active Member of Clubs or Organizations: Not on file  . Attends Archivist Meetings: Not on file  . Marital Status: Not on file   Family History  Problem Relation Age of  Onset  . Diabetes Daughter   . Kidney disease Daughter   . Lung cancer Mother        smoked  . COPD Mother        smoked  . COPD Father        smoked  . Prostate cancer Father   . Kidney failure Father   . Breast cancer Maternal Aunt   . Diabetes Maternal Grandmother   . Breast  cancer Maternal Grandmother   . Cancer Cousin        colon   Scheduled Meds: . albuterol  2.5 mg Inhalation TID  . azithromycin  500 mg Oral Daily  . B-complex with vitamin C  1 tablet Oral Daily  . buPROPion  150 mg Oral Daily  . busPIRone  10 mg Oral TID  . cholecalciferol  2,000 Units Oral Daily  . enoxaparin (LOVENOX) injection  40 mg Subcutaneous Q24H  . feeding supplement (ENSURE ENLIVE)  237 mL Oral BID BM  . furosemide  40 mg Oral BID  . guaiFENesin  600 mg Oral BID  . oxybutynin  5 mg Oral QHS  . pantoprazole  40 mg Oral Daily  . predniSONE  40 mg Oral Q breakfast  . rosuvastatin  5 mg Oral Daily  . sertraline  200 mg Oral BID  . tiotropium  18 mcg Inhalation Daily  . umeclidinium-vilanterol  1 puff Inhalation Daily   Continuous Infusions: PRN Meds:.albuterol, ALPRAZolam, ondansetron Medications Prior to Admission:  Prior to Admission medications   Medication Sig Start Date End Date Taking? Authorizing Provider  ALPRAZolam Prudy Feeler) 0.5 MG tablet Take 1 tablet (0.5 mg total) by mouth 3 (three) times daily as needed for anxiety. 10/24/19  Yes Baity, Salvadore Oxford, NP  B Complex-C (B-COMPLEX WITH VITAMIN C) tablet Take 1 tablet by mouth daily. 09/28/19  Yes Esaw Grandchild A, DO  buPROPion (WELLBUTRIN XL) 150 MG 24 hr tablet TAKE 1 TABLET BY MOUTH EVERY DAY Patient taking differently: Take 150 mg by mouth daily.  09/28/19  Yes Baity, Salvadore Oxford, NP  busPIRone (BUSPAR) 10 MG tablet Take 1 tablet (10 mg total) by mouth 3 (three) times daily. 08/15/19  Yes Tami Munroe, NP  Cholecalciferol (VITAMIN D-3) 1000 UNITS CAPS Take 2,000 Units by mouth daily.    Yes [provider]  conjugated  estrogens (PREMARIN) vaginal cream Apply 0.5g (pea-sized amount) just inside the vaginal introitus with a finger-tip on Monday,Wednesday and Friday night 06/07/19  Yes Baity, Salvadore Oxford, NP  furosemide (LASIX) 20 MG tablet Take 1 tablet (20 mg total) by mouth as needed for edema. 10/13/18  Yes Iran Ouch, MD  metoprolol succinate (TOPROL-XL) 50 MG 24 hr tablet TAKE 1 TABLET (50 MG TOTAL) BY MOUTH DAILY. TAKE WITH OR IMMEDIATELY FOLLOWING A MEAL. 10/11/19 01/09/20 Yes Iran Ouch, MD  omeprazole (PRILOSEC) 40 MG capsule TAKE 1 CAPSULE BY MOUTH EVERY DAY Patient taking differently: Take 40 mg by mouth daily.  09/28/19  Yes Baity, Salvadore Oxford, NP  oxybutynin (DITROPAN-XL) 5 MG 24 hr tablet TAKE 1 TABLET BY MOUTH EVERYDAY AT BEDTIME Patient taking differently: Take 5 mg by mouth at bedtime.  08/29/19  Yes Baity, Salvadore Oxford, NP  rosuvastatin (CRESTOR) 5 MG tablet TAKE 1 TABLET BY MOUTH EVERY DAY Patient taking differently: Take 5 mg by mouth daily.  09/07/19  Yes Baity, Salvadore Oxford, NP  sertraline (ZOLOFT) 100 MG tablet TAKE 2 TABLETS BY MOUTH TWICE A DAY Patient taking differently: Take 200 mg by mouth 2 (two) times daily.  07/27/19  Yes Tami Munroe, NP  tiotropium (SPIRIVA HANDIHALER) 18 MCG inhalation capsule Place 1 capsule (18 mcg total) into inhaler and inhale daily. 10/22/18  Yes Merwyn Katos, MD  WIXELA INHUB 250-50 MCG/DOSE AEPB 1 puff 2 (two) times daily. 08/07/19  Yes [provider]  albuterol (PROVENTIL HFA;VENTOLIN HFA) 108 (90 Base) MCG/ACT inhaler INHALE 2 PUFFS INTO THE LUNGS EVERY 4 (FOUR)  HOURS AS NEEDED FOR WHEEZING OR SHORTNESS OF BREATH. 11/22/18   Merwyn KatosSimonds, David B, MD  albuterol (PROVENTIL) (2.5 MG/3ML) 0.083% nebulizer solution Take 3 mLs (2.5 mg total) by nebulization every 6 (six) hours as needed for wheezing or shortness of breath. J44.9 11/22/18   Merwyn KatosSimonds, David B, MD  budesonide (PULMICORT) 0.25 MG/2ML nebulizer solution Take 2 mLs by nebulization 2 (two) times daily.  09/27/19   [provider]  feeding supplement, ENSURE ENLIVE, (ENSURE ENLIVE) LIQD Take 237 mLs by mouth 2 (two) times daily between meals. 09/27/19   Pennie BanterGriffith, Kelly A, DO  guaiFENesin (MUCINEX) 600 MG 12 hr tablet Take 1 tablet (600 mg total) by mouth 2 (two) times daily. 09/27/19   Esaw GrandchildGriffith, Kelly A, DO  nystatin (MYCOSTATIN/NYSTOP) powder Apply topically 3 (three) times daily. 07/21/19   Tami MunroeBaity, Regina W, NP  ondansetron (ZOFRAN ODT) 8 MG disintegrating tablet Take 1 tablet (8 mg total) by mouth every 8 (eight) hours as needed for nausea or vomiting. Patient not taking: Reported on 10/26/2019 08/19/19   Tami MunroeBaity, Regina W, NP  ondansetron (ZOFRAN) 4 MG tablet TAKE 1 TABLET BY MOUTH EVERY 8 HOURS AS NEEDED FOR NAUSEA AND VOMITING Patient not taking: Reported on 10/26/2019 10/24/19   Tami MunroeBaity, Regina W, NP  ONE TOUCH ULTRA TEST test strip 1 EACH BY OTHER ROUTE DAILY AS NEEDED FOR OTHER. 09/01/18   Tami MunroeBaity, Regina W, NP   Allergies  Allergen Reactions  . Amoxicillin-Pot Clavulanate     REACTION: stomach sensitive.  Lenice Llamas. Daliresp [Roflumilast]     Severe nausea   . Erythromycin Base     REACTION: GI upset (cramping and diarrhea)  . Neurontin [Gabapentin]     anxiety   Review of Systems  Unable to perform ROS: Other    Physical Exam Vitals and nursing note reviewed.     Vital Signs: BP (!) 142/87 (BP Location: Left Arm)   Pulse 100   Temp 98 F (36.7 C)   Resp 18   Ht 5\' 6"  (1.676 m)   Wt 63.5 kg   SpO2 98%   BMI 22.60 kg/m  Pain Scale: 0-10 POSS *See Group Information*: 1-Acceptable,Awake and alert Pain Score: 0-No pain   SpO2: SpO2: 98 % O2 Device:SpO2: 98 % O2 Flow Rate: .O2 Flow Rate (L/min): 4 L/min  IO: Intake/output summary:   Intake/Output Summary (Last 24 hours) at 10/28/2019 0944 Last data filed at 10/27/2019 2338 Gross per 24 hour  Intake 0 ml  Output 800 ml  Net -800 ml    LBM: Last BM Date: 10/27/19 Baseline Weight: Weight: 63.5 kg Most recent  weight: Weight: 63.5 kg     Palliative Assessment/Data:   Flowsheet Rows     Most Recent Value  Intake Tab  Referral Department  Hospitalist  Unit at Time of Referral  Med/Surg Unit  Palliative Care Primary Diagnosis  Pulmonary  Date Notified  10/27/19  Palliative Care Type  Return patient Palliative Care  Reason for referral  Clarify Goals of Care  Date of Admission  10/26/19  Date first seen by Palliative Care  10/28/19  # of days Palliative referral response time  1 Day(s)  # of days IP prior to Palliative referral  1  Clinical Assessment  Palliative Performance Scale Score  50%  Pain Max last 24 hours  Not able to report  Pain Min Last 24 hours  Not able to report  Dyspnea Max Last 24 Hours  Not able to report  Dyspnea Min Last  24 hours  Not able to report  Psychosocial & Spiritual Assessment  Palliative Care Outcomes      Time In: 1120 Time Out: 1230 Time Total: 70 minutes  Greater than 50%  of this time was spent counseling and coordinating care related to the above assessment and plan.  Signed by: Katheran Awe, NP   Please contact Palliative Medicine Team phone at [phone] for questions and concerns.  For individual provider: See Loretha Stapler

## 2019-10-28 NOTE — TOC Initial Note (Addendum)
Transition of Care [facility]) - Initial/Assessment Note    Patient Details  Name: Tami Skinner MRN: [id number] Date of Birth: 03-29-1946  Transition of Care [facility]) CM/SW Contact:    Marshell Garfinkel, RN Phone Number: 10/28/2019, 12:29 PM  Clinical Narrative:                 [facility] consult received for hospice services in the home; MD hopes to transition to home tomorrow. I spoke with Husband Elenore Rota by phone [phone] and he agrees to hospice at home and requests that we "treat the treatable' and aware that home is the best setting for her as he states she became confused during the night last night. He would like to take her home after a hospital bed is arranged.She has a walker available in the home and continuous O2 through [place].  He states she was able to ride home in car last admit and not sure if she will need EMS this discharge or not. They will have [facility] Jan. 1 2021. I spoke with Lattie Haw at hospice and they will be able to supply supplemental O2 change out on Monday however they will need to address hospital bed and she will update this RNCM on that need asap. Update: call from [place] with [facility] [phone]. [facility] will provide O2 and hospital bed tomorrow. Please provide O2 Rx to include liter flow per nasal cannula, portable and concentrator with humidity. ?nebulizer. They will not need order for hospital bed. Jackelyn Poling will call husband Elenore Rota. Discharge orders will need to be faxed to [facility] [phone].  O2 order [facility]. Update DME will be delivered in AM (after 10AM) however spouse will be available to pick patient up after 1PM tomorrow.   Expected Discharge Plan: Home w Hospice Care Barriers to Discharge: Equipment Delay   Patient Goals and CMS Choice Patient states their goals for this hospitalization and ongoing recovery are:: go home CMS Medicare.gov Compare Post Acute Care list provided to:: Patient Represenative (must comment)(husband  Elenore Rota [phone]) Choice offered to / list presented to : Spouse  Expected Discharge Plan and Services Expected Discharge Plan: [place]   Discharge Planning Services: CM Consult Post Acute Care Choice: Durable Medical Equipment, Hospice Living arrangements for the past 2 months: [place]                 DME Arranged: Oxygen, Other see comment(hospital bed) DME Agency: ([facility]) Date DME Agency Contacted: 10/28/19 Time DME Agency Contacted: [phone] Representative spoke with at DME Agency: Lattie Haw [phone] [place] Arranged: PT, OT [place] Date [place]: 10/27/19 Time [place]: 1208 Representative spoke with at [place] Arrangements/Services Living arrangements for the past 2 months: [place] with:: Spouse Patient language and need for interpreter reviewed:: Yes Do you feel safe going back to the place where you live?: Yes      Need for Family Participation in Patient Care: No (Comment) Care giver support system in place?: Yes (comment) Current home services: DME(walker) Criminal Activity/Legal Involvement Pertinent to Current Situation/Hospitalization: No - Comment as needed  Activities of Daily Living Home Assistive Devices/Equipment: None ADL Screening (condition at time of admission) Patient's cognitive ability adequate to safely complete daily activities?: Yes Is the patient deaf or have difficulty hearing?: No Does the patient have difficulty seeing, even when wearing glasses/contacts?: No Does the patient have difficulty concentrating, remembering, or making decisions?:  Yes Patient able to express need for assistance with ADLs?: Yes Does the patient have difficulty dressing or bathing?: No Independently performs ADLs?: Yes (appropriate for developmental age) Does the patient have difficulty walking or climbing stairs?: Yes Weakness of Legs: Both Weakness  of Arms/Hands: None  Permission Sought/Granted   Permission granted to share information with : Yes, Verbal Permission Granted              Emotional Assessment Appearance:: Appears stated age Attitude/Demeanor/Rapport: Engaged Affect (typically observed): Appropriate Orientation: : Oriented to Self, Oriented to Place, Oriented to  Time, Oriented to Situation Alcohol / Substance Use: Not Applicable Psych Involvement: No (comment)  Admission diagnosis:  Chronic obstructive pulmonary disease with acute exacerbation (HCC) [J44.1] Acute respiratory failure with hypoxemia (HCC) [J96.01] Acute on chronic respiratory failure with hypoxia and hypercapnia (HCC) [C62.37, J96.22] Patient Active Problem List   Diagnosis Date Noted  . Goals of care, counseling/discussion   . Palliative care by specialist   . DNR (do not resuscitate) discussion   . Encounter for hospice care discussion   . Acute respiratory failure with hypoxemia (HCC) 10/26/2019  . Acute on chronic respiratory failure with hypoxia and hypercapnia (HCC)   . NSTEMI (non-ST elevated myocardial infarction) (HCC) 10/06/2014  . Stress-induced cardiomyopathy   . Dyshidrotic eczema 10/11/2013  . BPV (benign positional vertigo) 08/02/2013  . Essential hypertension, benign 03/31/2013  . Solitary pulmonary nodule 12/20/2012  . Postmenopausal estrogen deficiency 09/07/2012  . Anxiety and depression 11/24/2011  . Diabetes mellitus type 2, controlled (HCC) 09/04/2011  . COPD (chronic obstructive pulmonary disease) (HCC) 09/04/2011  . OSA (obstructive sleep apnea) 02/17/2007  . Hyperlipidemia 04/04/2003   PCP:  Lorre Munroe, NP Pharmacy:   [facility] [phone] - [address] [place] - [address] [place] [address] Phone: [phone] Fax: [phone]     Social Determinants of Health (SDOH) Interventions    Readmission Risk Interventions No flowsheet data found.

## 2019-10-28 NOTE — Consult Note (Signed)
[facility] Face-to-Face Psychiatry Consult   Reason for Consult:  Psych f/u Referring Physician:  Dr Sherryll Burger Patient Identification: Tami Skinner MRN:  [id number] Principal Diagnosis: <principal problem not specified> Diagnosis:  Active Problems:   Acute respiratory failure with hypoxemia [facility])   Total Time spent with patient: 15 minutes   Patient seen today for psych follow-up. Chart reviewed. Patient discussed with nursing. Patient expressed agitated behavior last night and there was a concern for visual hallucinations ("seeing men in the window").  Subjective:  Patient reports she had a rough evening, then slept well after medications. She is awake and alert. She is confused about today`s date "December 31st 2020", although she is aware that "this is Christmas day". She correctly says that she is in the hospital due to "respiratory problems". She reports anxiety and sadness due to medical condition, she denies thoughts of harming self or others. She denies visual or auditory hallucinations at the time of the interview, does not express any delusions.   Past Medical History:  Past Medical History:  Diagnosis Date  . Anxiety   . Asthmatic bronchitis   . COPD (chronic obstructive pulmonary disease) (HCC)   . Depression   . Diabetes mellitus   . Diverticulitis   . Diverticulosis   . Hyperlipidemia   . Hypertension   . Osteopenia   . Sleep apnea   . Stress-induced cardiomyopathy    a. echo 09/28/2014: EF 45-50%, severe HK of mid-distal anterior, apical and mid-distal inferior walls suggestive of stress induced CM, mildly dilated LA, mildly dilated PASP, mild TR, trivial pericardial effusion    Past Surgical History:  Procedure Laterality Date  . ABDOMINAL HYSTERECTOMY     partial  . BREAST BIOPSY Right    neg  . CARDIAC CATHETERIZATION  09/29/2014   [facility]  . NASAL SINUS SURGERY     Family History:  Family History  Problem Relation Age of Onset  . Diabetes Daughter   . Kidney  disease Daughter   . Lung cancer Mother        smoked  . COPD Mother        smoked  . COPD Father        smoked  . Prostate cancer Father   . Kidney failure Father   . Breast cancer Maternal Aunt   . Diabetes Maternal Grandmother   . Breast cancer Maternal Grandmother   . Cancer Cousin        colon   Family Psychiatric  History: n/a Social History:  Social History   Substance and Sexual Activity  Alcohol Use No     Social History   Substance and Sexual Activity  Drug Use No    Social History   Socioeconomic History  . Marital status: Married    Spouse name: Not on file  . Number of children: Not on file  . Years of education: Not on file  . Highest education level: Not on file  Occupational History  . Not on file  Tobacco Use  . Smoking status: Former Smoker    Packs/day: 0.25    Years: 40.00    Pack years: 10.00    Types: Cigarettes    Quit date: 09/01/2016    Years since quitting: 3.1  . Smokeless tobacco: Never Used  . Tobacco comment: Uses nicotrol inhaler on occasion  Substance and Sexual Activity  . Alcohol use: No  . Drug use: No  . Sexual activity: Not Currently  Other Topics Concern  . Not  on file  Social History Narrative   Lives with husband in [place]. 2 dogs. Works at [employer].   Social Determinants of Health   Financial Resource Strain:   . Difficulty of Paying Living Expenses: Not on file  Food Insecurity:   . Worried About Programme researcher, broadcasting/film/video in the Last Year: Not on file  . Ran Out of Food in the Last Year: Not on file  Transportation Needs:   . Lack of Transportation (Medical): Not on file  . Lack of Transportation (Non-Medical): Not on file  Physical Activity:   . Days of Exercise per Week: Not on file  . Minutes of Exercise per Session: Not on file  Stress:   . Feeling of Stress : Not on file  Social Connections:   . Frequency of Communication with Friends and Family: Not on file  . Frequency of Social Gatherings with Friends  and Family: Not on file  . Attends Religious Services: Not on file  . Active Member of Clubs or Organizations: Not on file  . Attends Banker Meetings: Not on file  . Marital Status: Not on file   Additional Social History:    Allergies:   Allergies  Allergen Reactions  . Amoxicillin-Pot Clavulanate     REACTION: stomach sensitive.  Lenice Llamas [Roflumilast]     Severe nausea   . Erythromycin Base     REACTION: GI upset (cramping and diarrhea)  . Neurontin [Gabapentin]     anxiety    Labs:  Results for orders placed or performed during the hospital encounter of 10/26/19 (from the past 48 hour(s))  Lactic acid, plasma     Status: None   Collection Time: 10/26/19  1:43 PM  Result Value Ref Range   Lactic Acid, Venous 0.5 0.5 - 1.9 mmol/L    Comment: Performed at [facility], [address] [place], [place] [address]  Comprehensive metabolic panel     Status: Abnormal   Collection Time: 10/26/19  1:45 PM  Result Value Ref Range   Sodium 142 135 - 145 mmol/L   Potassium 4.2 3.5 - 5.1 mmol/L   Chloride 96 (L) 98 - 111 mmol/L   CO2 35 (H) 22 - 32 mmol/L   Glucose, Bld 124 (H) 70 - 99 mg/dL   BUN 16 8 - 23 mg/dL   Creatinine, Ser 6.04 (L) 0.44 - 1.00 mg/dL   Calcium 9.6 8.9 - 54.0 mg/dL   Total Protein 6.9 6.5 - 8.1 g/dL   Albumin 4.2 3.5 - 5.0 g/dL   AST 17 15 - 41 U/L   ALT 17 0 - 44 U/L   Alkaline Phosphatase 55 38 - 126 U/L   Total Bilirubin 0.6 0.3 - 1.2 mg/dL   GFR calc non Af Amer >60 >60 mL/min   GFR calc Af Amer >60 >60 mL/min   Anion gap 11 5 - 15    Comment: Performed at [facility], [address] [place], [place] [address]  CBC WITH DIFFERENTIAL     Status: Abnormal   Collection Time: 10/26/19  1:45 PM  Result Value Ref Range   WBC 7.2 4.0 - 10.5 K/uL   RBC 4.38 3.87 - 5.11 MIL/uL   Hemoglobin 12.5 12.0 - 15.0 g/dL   HCT 14.7 82.9 - 56.2 %   MCV 96.1 80.0 - 100.0 fL   MCH 28.5 26.0 - 34.0 pg   MCHC 29.7 (L) 30.0 -  36.0 g/dL   RDW 13.0 86.5 -  15.5 %   Platelets 234 150 - 400 K/uL   nRBC 0.0 0.0 - 0.2 %   Neutrophils Relative % 74 %   Neutro Abs 5.4 1.7 - 7.7 K/uL   Lymphocytes Relative 13 %   Lymphs Abs 0.9 0.7 - 4.0 K/uL   Monocytes Relative 7 %   Monocytes Absolute 0.5 0.1 - 1.0 K/uL   Eosinophils Relative 4 %   Eosinophils Absolute 0.3 0.0 - 0.5 K/uL   Basophils Relative 1 %   Basophils Absolute 0.0 0.0 - 0.1 K/uL   Immature Granulocytes 1 %   Abs Immature Granulocytes 0.10 (H) 0.00 - 0.07 K/uL    Comment: Performed at [facility], [address] [place], [place] [address]  Blood Culture (routine x 2)     Status: None (Preliminary result)   Collection Time: 10/26/19  1:45 PM   Specimen: BLOOD  Result Value Ref Range   Specimen Description BLOOD RIGHT ANTECUBITAL    Special Requests      BOTTLES DRAWN AEROBIC AND ANAEROBIC Blood Culture results may not be optimal due to an excessive volume of blood received in culture bottles   Culture      NO GROWTH 2 DAYS Performed at [facility], [address] [place], [place] [address]    Report Status PENDING   Procalcitonin     Status: None   Collection Time: 10/26/19  1:45 PM  Result Value Ref Range   Procalcitonin <0.10 ng/mL    Comment:        Interpretation: PCT (Procalcitonin) <= 0.5 ng/mL: Systemic infection (sepsis) is not likely. Local bacterial infection is possible. (NOTE)       Sepsis PCT Algorithm           Lower Respiratory Tract                                      Infection PCT Algorithm    ----------------------------     ----------------------------         PCT < 0.25 ng/mL                PCT < 0.10 ng/mL         Strongly encourage             Strongly discourage   discontinuation of antibiotics    initiation of antibiotics    ----------------------------     -----------------------------       PCT 0.25 - 0.50 ng/mL            PCT 0.10 - 0.25 ng/mL               OR       >80% decrease in PCT             Discourage initiation of                                            antibiotics      Encourage discontinuation           of antibiotics    ----------------------------     -----------------------------         PCT >= 0.50 ng/mL              PCT 0.26 - 0.50 ng/mL  AND        <80% decrease in PCT             Encourage initiation of                                             antibiotics       Encourage continuation           of antibiotics    ----------------------------     -----------------------------        PCT >= 0.50 ng/mL                  PCT > 0.50 ng/mL               AND         increase in PCT                  Strongly encourage                                      initiation of antibiotics    Strongly encourage escalation           of antibiotics                                     -----------------------------                                           PCT <= 0.25 ng/mL                                                 OR                                        > 80% decrease in PCT                                     Discontinue / Do not initiate                                             antibiotics Performed at [facility], [address], [place], [place] [address]   Magnesium     Status: None   Collection Time: 10/26/19  1:45 PM  Result Value Ref Range   Magnesium 1.8 1.7 - 2.4 mg/dL    Comment: Performed at [facility], [address] [place],  [address]  Brain natriuretic peptide     Status: Abnormal   Collection Time: 10/26/19  1:46 PM  Result Value Ref Range   B Natriuretic Peptide 410.0 (H) 0.0 - 100.0 pg/mL    Comment: Performed at [facility], [address] [place], [place] [address]  Blood gas, venous ([facility], [facility], [facility])     Status: Abnormal   Collection Time: 10/26/19  1:46 PM  Result Value Ref Range   pH, Ven 7.23 (L) 7.250 - 7.430   pCO2, Ven 104 (HH) 44.0 - 60.0 mmHg    Comment: CRITICAL RESULT  CALLED TO, READ BACK BY AND VERIFIED WITH: NOTIFIED DR.Cyril Loosen, ROBERT AT 1408 ON 10/26/2019 BY GKM    pO2, Ven 131.0 (H) 32.0 - 45.0 mmHg   Bicarbonate 43.6 (H) 20.0 - 28.0 mmol/L   Acid-Base Excess 11.5 (H) 0.0 - 2.0 mmol/L   O2 Saturation 98.5 %   Patient temperature 37.0    Collection site VEIN    Sample type VENOUS     Comment: Performed at [facility], [address], [place], [place] [address]  Blood Culture (routine x 2)     Status: None (Preliminary result)   Collection Time: 10/26/19  2:16 PM   Specimen: BLOOD  Result Value Ref Range   Specimen Description BLOOD BLOOD RIGHT WRIST    Special Requests      BOTTLES DRAWN AEROBIC AND ANAEROBIC Blood Culture results may not be optimal due to an excessive volume of blood received in culture bottles   Culture      NO GROWTH 2 DAYS Performed at [facility], [address] [place], [place] [address]    Report Status PENDING   Respiratory Panel by RT PCR (Flu A&B, Covid) - Nasopharyngeal Swab     Status: None   Collection Time: 10/26/19  2:28 PM   Specimen: Nasopharyngeal Swab  Result Value Ref Range   SARS Coronavirus 2 by RT PCR NEGATIVE NEGATIVE    Comment: (NOTE) SARS-CoV-2 target nucleic acids are NOT DETECTED. The SARS-CoV-2 RNA is generally detectable in upper respiratoy specimens during the acute phase of infection. The lowest concentration of SARS-CoV-2 viral copies this assay can detect is 131 copies/mL. A negative result does not preclude SARS-Cov-2 infection and should not be used as the sole basis for treatment or other patient management decisions. A negative result may occur with  improper specimen collection/handling, submission of specimen other than nasopharyngeal swab, presence of viral mutation(s) within the areas targeted by this assay, and inadequate number of viral copies (<131 copies/mL). A negative result must be combined with clinical observations, patient history, and  epidemiological information. The expected result is Negative. Fact Sheet for Patients:  https://www.moore.com/ Fact Sheet for Healthcare Providers:  https://www.young.biz/ This test is not yet ap proved or cleared by the [place] FDA and  has been authorized for detection and/or diagnosis of SARS-CoV-2 by FDA under an Emergency Use Authorization (EUA). This EUA will remain  in effect (meaning this test can be used) for the duration of the COVID-19 declaration under Section 564(b)(1) of the Act, 21 U.S.C. section 360bbb-3(b)(1), unless the authorization is terminated or revoked sooner.    Influenza A by PCR NEGATIVE NEGATIVE   Influenza B by PCR NEGATIVE NEGATIVE    Comment: (NOTE) The Xpert Xpress SARS-CoV-2/FLU/RSV assay is intended as an aid in  the diagnosis of influenza from Nasopharyngeal swab specimens and  should not be used as a sole basis for treatment. Nasal washings and  aspirates are unacceptable for Xpert Xpress SARS-CoV-2/FLU/RSV  testing. Fact Sheet for Patients: https://www.moore.com/ Fact Sheet for Healthcare Providers: https://www.young.biz/ This test is not yet approved or cleared by the [place] FDA and  has been authorized for detection and/or diagnosis  of SARS-CoV-2 by  FDA under an Emergency Use Authorization (EUA). This EUA will remain  in effect (meaning this test can be used) for the duration of the  Covid-19 declaration under Section 564(b)(1) of the Act, 21  U.S.C. section 360bbb-3(b)(1), unless the authorization is  terminated or revoked. Performed at [facility], [address] [place], [place] [address]   Urinalysis, Complete w Microscopic     Status: Abnormal   Collection Time: 10/26/19  3:21 PM  Result Value Ref Range   Color, Urine YELLOW (A) YELLOW   APPearance CLOUDY (A) CLEAR   Specific Gravity, Urine 1.021 1.005 - 1.030   pH 5.0 5.0 - 8.0    Glucose, UA NEGATIVE NEGATIVE mg/dL   Hgb urine dipstick NEGATIVE NEGATIVE   Bilirubin Urine NEGATIVE NEGATIVE   Ketones, ur 20 (A) NEGATIVE mg/dL   Protein, ur 30 (A) NEGATIVE mg/dL   Nitrite NEGATIVE NEGATIVE   Leukocytes,Ua NEGATIVE NEGATIVE   RBC / HPF 6-10 0 - 5 RBC/hpf   WBC, UA 0-5 0 - 5 WBC/hpf   Bacteria, UA NONE SEEN NONE SEEN   Squamous Epithelial / LPF 0-5 0 - 5   Mucus PRESENT     Comment: Performed at [facility], [address] [place], [place] [address]  Urine culture     Status: Abnormal   Collection Time: 10/26/19  3:24 PM   Specimen: In/Out Cath Urine  Result Value Ref Range   Specimen Description      IN/OUT CATH URINE Performed at [facility], [address] [place], [place] [address]    Special Requests      NONE Performed at [facility], [address], [place], [place] [address]    Culture MULTIPLE SPECIES PRESENT, SUGGEST RECOLLECTION (A)    Report Status 10/28/2019 FINAL   Blood gas, arterial     Status: Abnormal   Collection Time: 10/26/19  6:38 PM  Result Value Ref Range   FIO2 0.28    Delivery systems NASAL CANNULA    pH, Arterial 7.34 (L) 7.350 - 7.450   pCO2 arterial 81 (HH) 32.0 - 48.0 mmHg    Comment: CRITICAL RESULT CALLED TO, READ BACK BY AND VERIFIED WITH: NOTIFIED TO DR.STAFFORD, P AT 1842 ON 10/26/2019 BY GKM    pO2, Arterial 66 (L) 83.0 - 108.0 mmHg   Bicarbonate 43.7 (H) 20.0 - 28.0 mmol/L   Acid-Base Excess 14.0 (H) 0.0 - 2.0 mmol/L   O2 Saturation 91.5 %   Patient temperature 37.0    Collection site RIGHT BRACHIAL    Sample type ARTERIAL DRAW    Allens test (pass/fail) PASS PASS    Comment: Performed at [facility], [address] [place], [place] [address]  Lactic acid, plasma     Status: None   Collection Time: 10/26/19  9:56 PM  Result Value Ref Range   Lactic Acid, Venous 1.2 0.5 - 1.9 mmol/L    Comment: Performed at [facility], [address] [place],  [place] [address]  HIV Antibody (routine testing w rflx)     Status: None   Collection Time: 10/26/19  9:56 PM  Result Value Ref Range   HIV Screen 4th Generation wRfx NON REACTIVE NON REACTIVE    Comment: Performed at [facility], [address], [place], [place] [address]  AM Labs cbc     Status: Abnormal   Collection Time: 10/28/19  2:01 AM  Result Value Ref Range  WBC 7.8 4.0 - 10.5 K/uL   RBC 4.09 3.87 - 5.11 MIL/uL   Hemoglobin 11.6 (L) 12.0 - 15.0 g/dL   HCT 40.936.8 81.136.0 - 91.446.0 %   MCV 90.0 80.0 - 100.0 fL   MCH 28.4 26.0 - 34.0 pg   MCHC 31.5 30.0 - 36.0 g/dL   RDW 78.213.4 95.611.5 - 21.315.5 %   Platelets 264 150 - 400 K/uL   nRBC 0.0 0.0 - 0.2 %    Comment: Performed at [facility], [address] [place], [place] [address]  AM Labs bmp     Status: Abnormal   Collection Time: 10/28/19  2:01 AM  Result Value Ref Range   Sodium 142 135 - 145 mmol/L   Potassium 3.4 (L) 3.5 - 5.1 mmol/L   Chloride 90 (L) 98 - 111 mmol/L   CO2 37 (H) 22 - 32 mmol/L   Glucose, Bld 166 (H) 70 - 99 mg/dL   BUN 22 8 - 23 mg/dL   Creatinine, Ser 8.460.87 0.44 - 1.00 mg/dL   Calcium 96.210.0 8.9 - 95.210.3 mg/dL   GFR calc non Af Amer >60 >60 mL/min   GFR calc Af Amer >60 >60 mL/min   Anion gap 15 5 - 15    Comment: Performed at [facility], [address] [place], [place] [address]    Current Facility-Administered Medications  Medication Dose Route Frequency Provider Last Rate Last Admin  . albuterol (PROVENTIL) (2.5 MG/3ML) 0.083% nebulizer solution 2.5 mg  2.5 mg Inhalation TID Delfino LovettShah, Vipul, MD   2.5 mg at 10/28/19 0745  . albuterol (PROVENTIL) (2.5 MG/3ML) 0.083% nebulizer solution 2.5 mg  2.5 mg Nebulization Q4H PRN Delfino LovettShah, Vipul, MD   2.5 mg at 10/27/19 1628  . ALPRAZolam Prudy Feeler(XANAX) tablet 1 mg  1 mg Oral TID PRN Delfino LovettShah, Vipul, MD   1 mg at 10/28/19 0532  . azithromycin (ZITHROMAX) tablet 500 mg  500 mg Oral Daily Delfino LovettShah, Vipul, MD   500 mg at 10/28/19 0057  . B-complex with vitamin C tablet 1  tablet  1 tablet Oral Daily Delfino LovettShah, Vipul, MD   1 tablet at 10/27/19 0908  . buPROPion (WELLBUTRIN XL) 24 hr tablet 150 mg  150 mg Oral Daily Delfino LovettShah, Vipul, MD   150 mg at 10/27/19 0908  . busPIRone (BUSPAR) tablet 10 mg  10 mg Oral TID Delfino LovettShah, Vipul, MD   10 mg at 10/27/19 2226  . cholecalciferol (VITAMIN D3) tablet 2,000 Units  2,000 Units Oral Daily Delfino LovettShah, Vipul, MD   2,000 Units at 10/27/19 0908  . enoxaparin (LOVENOX) injection 40 mg  40 mg Subcutaneous Q24H Delfino LovettShah, Vipul, MD   40 mg at 10/27/19 2226  . feeding supplement (ENSURE ENLIVE) (ENSURE ENLIVE) liquid 237 mL  237 mL Oral BID BM Sherryll BurgerShah, Vipul, MD      . furosemide (LASIX) tablet 40 mg  40 mg Oral BID Delfino LovettShah, Vipul, MD   40 mg at 10/27/19 1723  . guaiFENesin (MUCINEX) 12 hr tablet 600 mg  600 mg Oral BID Delfino LovettShah, Vipul, MD   600 mg at 10/27/19 2226  . ondansetron (ZOFRAN-ODT) disintegrating tablet 8 mg  8 mg Oral Q8H PRN Delfino LovettShah, Vipul, MD      . oxybutynin (DITROPAN-XL) 24 hr tablet 5 mg  5 mg Oral QHS Delfino LovettShah, Vipul, MD   5 mg at 10/27/19 2226  . pantoprazole (PROTONIX) EC tablet 40 mg  40 mg Oral Daily Delfino LovettShah, Vipul, MD   40 mg at 10/27/19 0908  .  predniSONE (DELTASONE) tablet 40 mg  40 mg Oral Q breakfast Sherryll Burger, Vipul, MD      . rosuvastatin (CRESTOR) tablet 5 mg  5 mg Oral Daily Delfino Lovett, MD   5 mg at 10/27/19 0907  . sertraline (ZOLOFT) tablet 200 mg  200 mg Oral BID Delfino Lovett, MD   200 mg at 10/27/19 2225  . tiotropium (SPIRIVA) inhalation capsule ([facility] use ONLY) 18 mcg  18 mcg Inhalation Daily Delfino Lovett, MD   18 mcg at 10/27/19 1619  . umeclidinium-vilanterol (ANORO ELLIPTA) 62.5-25 MCG/INH 1 puff  1 puff Inhalation Daily Delfino Lovett, MD   1 puff at 10/27/19 0544   Psychiatric Specialty Exam: Physical Exam  Review of Systems  Blood pressure (!) 142/87, pulse 100, temperature 98 F (36.7 C), resp. rate 18, height  (1.676 m), weight 63.5 kg, SpO2 98 %.Body mass index is 22.6 kg/m.  General Appearance: Disheveled  Eye Contact:  Good   Speech:  Normal Rate  Volume:  Normal  Mood:  Euthymic  Affect:  Appropriate and Congruent  Thought Process:  Coherent and Goal Directed  Orientation:  Other:  place, self, situation.  Thought Content:  appears logical at the moment  Suicidal Thoughts:  No  Homicidal Thoughts:  No  Memory:  Immediate;   Fair Recent;   Fair  Judgement:  Fair  Insight:  questionable  Psychomotor Activity:  Normal  Concentration:  Concentration: Fair and Attention Span: Fair  Recall:  [employer] of Knowledge:  Fair  Language:  Good  Akathisia:  No  Handed:  Right  AIMS (if indicated):     Assets:  Communication Skills Desire for Improvement  ADL's:  Intact  Cognition:  WNL  Sleep:        Treatment Plan Summary: Medication management  Disposition: No evidence of imminent risk to self or others at present.   Patient does not meet criteria for psychiatric inpatient admission.  73 y.o. female patient a with a history of anxiety, depression, COPD, diabetes, cardiomyopathy presented in ER yesterday ago with altered mental status and worsening weakness over the last 3 days, home health nurse noted patient to be lethargic with increased work of breathing. Patient was admitted to a medical floor. Psychiatry consulted for anxiety.  Patient seen for psych f/u today. Patient appears alert, oriented for the most part, reports anxiety, does not express psychotic symptoms. However, there is a nursing report about nighttime agitation, confusion and possible hallucinations. This waxing and waning confusion and altered sensorium are suspicious for developing delirium.  Impression:  R/o delirium. Generalized anxiety disorder.  Major depressive disorder, per history.  Recommendations: -As with all hospitalized patients, would recommend delirium precautions: Minimize use of benzodiazepines, anticholinergics, and opiates, to the extent possible, as these can worsen mentation. Utilize non-pharmacological  interventions including frequent reorientation, maintaining day/night distinction (blinds closed during night, open during day), minimizing excessive stimulation, staff continuity, etc.  -continue Buspirone and Sertraline for anxiety. -would recommend discontinue Wellbutrin due to worsening anxiety. -use Xanax PRN anxiety cautiously as benzodiazepines can worsen delirium. -can use Seroquel 12.5 -  QHS PRN agitation, psychosis. Please, control QTc if antipsychotic is given.   Thalia Party, MD 10/28/2019 9:40 AM

## 2019-10-28 NOTE — Progress Notes (Signed)
1        Tami Skinner at [place] NAME: Tami Skinner    MR#:  [id number]  DATE OF BIRTH:  01-31-1946  SUBJECTIVE:  CHIEF COMPLAINT:   Chief Complaint  Patient presents with  . Weakness  Last evening patient got very anxious, agitated, delirious, visual hallucination requiring some benzos  REVIEW OF SYSTEMS:  Review of Systems  Unable to obtain due to patient's mental status, sleepy DRUG ALLERGIES:   Allergies  Allergen Reactions  . Amoxicillin-Pot Clavulanate     REACTION: stomach sensitive.  Newton Pigg [Roflumilast]     Severe nausea   . Erythromycin Base     REACTION: GI upset (cramping and diarrhea)  . Neurontin [Gabapentin]     anxiety   VITALS:  Blood pressure (!) 142/87, pulse 100, temperature 98 F (36.7 C), resp. rate 18, height 5\' 6"  (1.676 m), weight 63.5 kg, SpO2 98 %. PHYSICAL EXAMINATION:  Physical Exam HENT:     Head: Normocephalic and atraumatic.  Eyes:     Conjunctiva/sclera: Conjunctivae normal.     Pupils: Pupils are equal, round, and reactive to light.  Neck:     Thyroid: No thyromegaly.     Trachea: No tracheal deviation.  Cardiovascular:     Rate and Rhythm: Normal rate and regular rhythm.     Heart sounds: Normal heart sounds.  Pulmonary:     Effort: Pulmonary effort is normal. No respiratory distress.     Breath sounds: Examination of the right-lower field reveals rhonchi. Examination of the left-lower field reveals rhonchi. Decreased breath sounds and rhonchi present. No wheezing.  Chest:     Chest wall: No tenderness.  Abdominal:     General: Bowel sounds are normal. There is no distension.     Palpations: Abdomen is soft.     Tenderness: There is no abdominal tenderness.  Musculoskeletal:        General: Normal range of motion.     Cervical back: Normal range of motion and neck supple.  Skin:    General: Skin is warm and dry.     Findings: No rash.  Neurological:     Mental Status: She is lethargic and  disoriented.     Cranial Nerves: No cranial nerve deficit.     Comments: She is sleepy/sedated  Psychiatric:     Comments: Unable to evaluate as patient is sleepy    LABORATORY PANEL:  Female CBC Recent Labs  Lab 10/28/19 0201  WBC 7.8  HGB 11.6*  HCT 36.8  PLT 264   ------------------------------------------------------------------------------------------------------------------ Chemistries  Recent Labs  Lab 10/26/19 1345 10/28/19 0201  NA 142 142  K 4.2 3.4*  CL 96* 90*  CO2 35* 37*  GLUCOSE 124* 166*  BUN 16 22  CREATININE 0.43* 0.87  CALCIUM 9.6 10.0  MG 1.8  --   AST 17  --   ALT 17  --   ALKPHOS 55  --   BILITOT 0.6  --    RADIOLOGY:  No results found. 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 requiring Bipap  * Acute on chronic hypoxic hypercapnic respiratory failure secondary to AECOPD Hx: OSA and Asthmatic Bronchitis Continue po and nebulized steroids  Scheduled and prn bronchodilator therapy Empiric azithromycin  * Hypertension  Hx: Chronic Diastolic CHF, Hyperlipidemia and Stress-Induced Cardiomyopathy  Continue outpatient cardiac medications  -On oral Lasix  Type II Diabetes Mellitus  CBG's ac/hs  SSI -diabetic nurse consult  GERD Continue po protonix   Acute delirium with underlying depression/Anxiety  -She had nighttime agitation, confusion and hallucinations -concerning for delirium - delirium precautions: Minimize use of benzodiazepines, anticholinergics, and opiates, to the extent possible, as these can worsen mentation. Utilize non-pharmacological interventions including frequent reorientation, maintaining day/night distinction (blinds closed during night, open during day), minimizing excessive stimulation, staff continuity, etc. -continue Buspirone and Sertraline for anxiety. - Wellbutrin discontinued - Increase Xanax to 1 mg 3 times  daily PRN anxiety -use it cautiously as benzodiazepines can worsen delirium. -Start Seroquel 25mg  QHS PRN agitation, psychosis.  Monitor QTc   Had a long discussion with her husband over phone.  He is in agreement to take her home with 24/7 caregivers and palliative care/hospice if she is eligible   Covid and influenza A/B is negative   PT/OT recommended SNF but patient is refusing and want to go home with home health.  Her husband is also in agreement to take her home  Await palliative care c/s -   Overall poor prognosis. she will benefit from outpatient palliative care/hospice  All the records are reviewed and case discussed with Care Management/Social Worker. Management plans discussed with the patient, husband over phone and they are in agreement.  CODE STATUS: DNR  TOTAL TIME TAKING CARE OF THIS PATIENT: 35 minutes.   More than 50% of the time was spent in counseling/coordination of care: YES  POSSIBLE D/C IN 1-2 DAYS, DEPENDING ON CLINICAL CONDITION.   M.D on 10/28/2019 at 10:53 AM  Between 7am to 6pm - Pager - [phone]  After 6pm go to [email or web address] - password [email or web address]  [facility]   CC: Primary care physician; [phone], NP  Note: This dictation was prepared with Dragon dictation along with smaller phrase technology. Any transcriptional errors that result from this process are unintentional.

## 2019-10-29 DIAGNOSIS — J9622 Acute and chronic respiratory failure with hypercapnia: Secondary | ICD-10-CM

## 2019-10-29 DIAGNOSIS — J9621 Acute and chronic respiratory failure with hypoxia: Secondary | ICD-10-CM

## 2019-10-29 DIAGNOSIS — Z7189 Other specified counseling: Secondary | ICD-10-CM

## 2019-10-29 MED ORDER — QUETIAPINE FUMARATE 25 MG PO TABS: 25.0000 mg | ORAL_TABLET | Freq: Every evening | ORAL | 0 refills | Status: DC | PRN

## 2019-10-29 NOTE — TOC Transition Note (Addendum)
Transition of Care [facility]) - CM/SW Discharge Note   Patient Details  Name: Tami Skinner MRN: [id number] Date of Birth: Apr 01, 1946  [facility] CM/SW Contact:  Marshell Garfinkel, RN Phone Number: 10/29/2019, 9:58 AM   Clinical Narrative:     Husband will be available after 1pm to take patient home by private vehicle. Please call RNCM [phone] if this need changes. Please provide DNR form for patient's husband. [place] should have O2 and hospital bed delivered to home address after 10AM today. [place] [phone] fax [phone]. Update: discharge summary sent to hospice; still waiting on callback from admitting hospice nurse. Update: DME should have been delivered to home. Hospice nurse will be out this evening at 1930P. RN can call husband for patient pick up. No other RNCM needs.     Barriers to Discharge: Equipment Delay   Patient Goals and CMS Choice Patient states their goals for this hospitalization and ongoing recovery are:: go home CMS Medicare.gov Compare Post Acute Care list provided to:: Patient Represenative (must comment)(husband Elenore Rota [phone]) Choice offered to / list presented to : Spouse  Discharge Placement                       Discharge Plan and Services   Discharge Planning Services: CM Consult Post Acute Care Choice: Durable Medical Equipment, Hospice          DME Arranged: Oxygen, Other see comment(hospital bed) DME Agency: ([place]) Date DME Agency Contacted: 10/28/19 Time DME Agency Contacted: 1229 Representative spoke with at DME Agency: Lattie Haw [phone] [name] Arranged: PT, OT [place] Date [place]: 10/27/19 Time [place]: 1208 Representative spoke with at [place]: Cassie  Social Determinants of Health ([place]) Interventions     Readmission Risk Interventions No flowsheet data found.

## 2019-10-29 NOTE — Discharge Instructions (Signed)
Hospice Hospice is a service that is designed to provide people who are terminally ill and their families with medical, spiritual, and psychological support. Its aim is to improve your quality of life by keeping you as comfortable as possible in the final stages of life. Who will be my providers when I begin hospice care? Hospice teams often include:  A nurse.  A doctor. The hospice doctor will be available for your care, but you can include your regular doctor or nurse practitioner.  A social worker.  A counselor.  A religious leader (such as a chaplain).  A dietitian.  Therapists.  Trained volunteers who can help with care. What services does hospice provide? Hospice services can vary depending on the center or organization. Generally, they include:  Ways to keep you comfortable, such as: ? Providing care in your home or in a home-like setting. ? Working with your family and friends to help meet your needs. ? Allowing you to enjoy the support of loved ones by receiving much of your basic care from family and friends.  Pain relief and symptom management. The staff will supply all necessary medicines and equipment so that you can stay comfortable and alert enough to enjoy the company of your friends and family.  Visits or care from a nurse and doctor. This may include 24-hour on-call services.  Companionship when you are alone.  Allowing you and your family to rest. Hospice staff may do light housekeeping, prepare meals, and run errands.  Counseling. They will make sure your emotional, spiritual, and social needs are being met, as well as those needs of your family members.  Spiritual care. This will be individualized to meet your needs and your family's needs. It may involve: ? Helping you and your family understand the dying process. ? Helping you say goodbye to your family and friends. ? Performing a specific religious ceremony or ritual.  Massage.  Nutrition  therapy.  Physical and occupational therapy.  Short-term inpatient care, if something cannot be managed in the home.  Art or music therapy.  Bereavement support for grieving family members. When should hospice care begin? Most people who use hospice are believed to have less than 6 months to live.  Your family and health care providers can help you decide when hospice services should begin.  If you live longer than 6 months but your condition does not improve, your doctor may be able to approve you for continued hospice care.  If your condition improves, you may discontinue the program. What should I consider before selecting a program? Most hospice programs are run by nonprofit, independent organizations. Some are affiliated with hospitals, nursing homes, or home health care agencies. Hospice programs can take place in your home or at a hospice center, hospital, or skilled nursing facility. When choosing a hospice program, ask the following questions:  What services are available to me?  What services will be offered to my loved ones?  How involved will my loved ones be?  How involved will my health care provider be?  Who makes up the hospice care team? How are they trained or screened?  How will my pain and symptoms be managed?  If my circumstances change, can the services be provided in a different setting, such as my home or in the hospital?  Is the program reviewed and licensed by the state or certified in some other way?  What does it cost? Is it covered by insurance?  If I choose a hospice   center or nursing home, where is the hospice center located? Is it convenient for family and friends?  If I choose a hospice center or nursing home, can my family and friends visit any time?  Will you provide emotional and spiritual support?  Who can my family call with questions? Where can I learn more about hospice? You can learn about existing hospice programs in your area  from your health care providers. You can also read more about hospice online. The websites of the following organizations have helpful information:  [facility] and Palliative Care Organization [facility]): http://www.brown-buchanan.com/  National Association for [facility]): http://massey-hart.com/  Hospice Foundation of America ([place]): www.hospicefoundation.org  American Cancer Society (ACS): www.cancer.org  Hospice Net: www.hospicenet.org  Visiting Nurse Associations of [place] (VNAA): www.vnaa.org You may also find more information by contacting the following agencies:  A local agency on aging.  Your local [employer] chapter.  Your state's department of health or social services. Summary  Hospice is a service that is designed to provide people who are terminally ill and their families with medical, spiritual, and psychological support.  Hospice aims to improve your quality of life by keeping you as comfortable as possible in the final stages of life.  Hospice teams often include a doctor, nurse, social worker, counselor, religious leader,dietitian, therapists, and volunteers.  Hospice care generally includes medicine for symptom management, visits from doctors and nurses, physical and occupational therapy, nutrition counseling, spiritual and emotional counseling, caregiver support, and bereavement support for grieving family members.  Hospice programs can take place in your home or at a hospice center, hospital, or skilled nursing facility. This information is not intended to replace advice given to you by your health care provider. Make sure you discuss any questions you have with your health care provider. Document Released: 02/06/2004 Document Revised: 10/02/2017 Document Reviewed: 11/11/2016 Elsevier Patient Education  2020 [employer].

## 2019-10-29 NOTE — Discharge Summary (Signed)
[place] [facility] [place] NAME: Tami Skinner    MR#:  [id number]  DATE OF BIRTH:  1946-02-16  DATE OF ADMISSION:  10/26/2019   ADMITTING PHYSICIAN: Max Sane, MD  DATE OF DISCHARGE: 10/29/2019  PRIMARY CARE PHYSICIAN: Jearld Fenton, NP   ADMISSION DIAGNOSIS:  Chronic obstructive pulmonary disease with acute exacerbation ([place]) [J44.1] Acute respiratory failure with hypoxemia ([place]) [J96.01] Acute on chronic respiratory failure with hypoxia and hypercapnia (H[facility]) [J96.21, J96.22] DISCHARGE DIAGNOSIS:  Active Problems:   Acute respiratory failure with hypoxemia (H[facility])   Goals of care, counseling/discussion   Palliative care by specialist   DNR (do not resuscitate) discussion   Encounter for hospice care discussion  SECONDARY DIAGNOSIS:   Past Medical History:  Diagnosis Date  . Anxiety   . Asthmatic bronchitis   . COPD (chronic obstructive pulmonary disease) ([place])   . Depression   . Diabetes mellitus   . Diverticulitis   . Diverticulosis   . Hyperlipidemia   . Hypertension   . Osteopenia   . Sleep apnea   . Stress-induced cardiomyopathy    a. 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  Family is providing support with 24/7 caregivers  as needed.  Patient will have oxygen in hospital bed arranged by [facility] team before discharge. DISCHARGE CONDITIONS:  Fair CONSULTS OBTAINED:   DRUG ALLERGIES:   Allergies  Allergen Reactions  . Amoxicillin-Pot Clavulanate     REACTION: stomach sensitive.  Newton Pigg [Roflumilast]     Severe nausea   . Erythromycin Base     REACTION: GI upset (cramping and diarrhea)  . Neurontin [Gabapentin]     anxiety   DISCHARGE MEDICATIONS:   Allergies as of 10/29/2019      Reactions   Amoxicillin-pot Clavulanate    REACTION: stomach sensitive.   Daliresp [roflumilast]    Severe nausea    Erythromycin Base    REACTION: GI upset (cramping and diarrhea)   Neurontin [gabapentin]    anxiety      Medication List    STOP taking these medications   B-complex with vitamin C tablet   buPROPion 150 MG 24 hr tablet Commonly known as: WELLBUTRIN XL   ondansetron 4 MG tablet Commonly known as: ZOFRAN   ondansetron 8 MG disintegrating tablet Commonly known as: Zofran ODT   rosuvastatin 5 MG tablet Commonly known as: CRESTOR   Vitamin D-3 25 MCG (1000 UT) Caps   Wixela Inhub 250-50 MCG/DOSE Aepb Generic drug: Fluticasone-Salmeterol     TAKE these medications   albuterol 108 (90 Base) MCG/ACT inhaler Commonly known as: VENTOLIN HFA INHALE 2 PUFFS INTO THE LUNGS EVERY 4 (FOUR) HOURS AS NEEDED FOR WHEEZING OR SHORTNESS OF BREATH. What changed: Another medication with the same name was removed. Continue taking this medication, and follow the directions you see  here.   ALPRAZolam 0.5 MG tablet Commonly known as: XANAX Take 1 tablet (0.5 mg total) by mouth 3 (three) times daily as needed for anxiety.   budesonide 0.25 MG/2ML nebulizer solution Commonly known as: PULMICORT Take 2 mLs by nebulization 2 (two) times daily.   busPIRone 10 MG tablet Commonly known as: BUSPAR Take 1 tablet (10 mg total) by mouth 3 (three) times daily.   feeding supplement (ENSURE ENLIVE) Liqd Take 237  mLs by mouth 2 (two) times daily between meals.   furosemide 20 MG tablet Commonly known as: LASIX Take 1 tablet (20 mg total) by mouth as needed for edema.   guaiFENesin 600 MG 12 hr tablet Commonly known as: MUCINEX Take 1 tablet (600 mg total) by mouth 2 (two) times daily.   metoprolol succinate 50 MG 24 hr tablet Commonly known as: TOPROL-XL TAKE 1 TABLET (50 MG TOTAL) BY MOUTH DAILY. TAKE WITH OR IMMEDIATELY FOLLOWING A MEAL.   nystatin powder Commonly known as: MYCOSTATIN/NYSTOP Apply topically 3 (three) times daily.   omeprazole 40 MG capsule Commonly known as: PRILOSEC TAKE 1 CAPSULE BY MOUTH EVERY DAY What changed: how much to take   ONE TOUCH ULTRA TEST test strip Generic drug: glucose blood 1 EACH BY OTHER ROUTE DAILY AS NEEDED FOR OTHER.   oxybutynin 5 MG 24 hr tablet Commonly known as: DITROPAN-XL TAKE 1 TABLET BY MOUTH EVERYDAY AT BEDTIME What changed: See the new instructions.   Premarin vaginal cream Generic drug: conjugated estrogens Apply 0.5g (pea-sized amount) just inside the vaginal introitus with a finger-tip on Monday,Wednesday and Friday night   QUEtiapine 25 MG tablet Commonly known as: SEROQUEL Take 1 tablet (25 mg total) by mouth at bedtime as needed (Insomnia/anxiety).   sertraline 100 MG tablet Commonly known as: ZOLOFT TAKE 2 TABLETS BY MOUTH TWICE A DAY   tiotropium 18 MCG inhalation capsule Commonly known as: Spiriva HandiHaler Place 1 capsule (18 mcg total) into inhaler and inhale daily.            Durable Medical Equipment  (From admission, onward)         Start     Ordered   10/28/19 1444  For home use only DME oxygen  Once    Question Answer Comment  Length of Need Lifetime   Mode or (Route) Nasal cannula   Liters per Minute 2   Frequency Continuous (stationary and portable oxygen unit needed)   Oxygen conserving device Yes   Oxygen delivery system Gas      10/28/19 1443         DISCHARGE INSTRUCTIONS:    DIET:  Regular diet DISCHARGE CONDITION:  [place] ACTIVITY:  Activity as tolerated OXYGEN:  Home Oxygen: Yes.    Oxygen Delivery: 2-3 liters/min via Patient connected to nasal cannula oxygen DISCHARGE LOCATION:  Home with Hospice -please note patient and her husband would like to avoid hospitalization if at all possible  If you experience worsening of your admission symptoms, develop shortness of breath, life threatening emergency, suicidal or homicidal thoughts you must seek medical attention immediately by calling 911 or calling your MD immediately  if symptoms less severe.  You Must read complete instructions/literature along with all the possible adverse reactions/side effects for all the Medicines you take and that have been prescribed to you. Take any new Medicines after you have completely understood and accpet all the possible adverse reactions/side effects.   Please note  You were cared for by a hospitalist during your hospital  stay. If you have any questions about your discharge medications or the care you received while you were in the hospital after you are discharged, you can call the unit and asked to speak with the hospitalist on call if the hospitalist that took care of you is not available. Once you are discharged, your primary care physician will handle any further medical issues. Please note that NO REFILLS for any discharge medications will be authorized once you are discharged, as it is imperative that you return to your primary care physician (or establish a relationship with a primary care physician if you do not have one) for your aftercare needs so that they can reassess your need for medications and monitor your lab values.    On the day of Discharge:  VITAL SIGNS:  Blood pressure 118/72, pulse 88, temperature 97.9 F (36.6 C), temperature source Oral, resp. rate (!) 24, height 5\' 6"  (1.676 m), weight 63.5 kg, SpO2 95 %. PHYSICAL EXAMINATION:  GENERAL:  73  y.o.-year-old patient lying in the bed with no acute distress.  EYES: Pupils equal, round, reactive to light and accommodation. No scleral icterus. Extraocular muscles intact.  HEENT: Head atraumatic, normocephalic. Oropharynx and nasopharynx clear.  NECK:  Supple, no jugular venous distention. No thyroid enlargement, no tenderness.  LUNGS: Normal breath sounds bilaterally, no wheezing, rales,rhonchi or crepitation. No use of accessory muscles of respiration.  CARDIOVASCULAR: S1, S2 normal. No murmurs, rubs, or gallops.  ABDOMEN: Soft, non-tender, non-distended. Bowel sounds present. No organomegaly or mass.  EXTREMITIES: No pedal edema, cyanosis, or clubbing.  NEUROLOGIC: Cranial nerves II through XII are intact. Muscle strength 5/5 in all extremities. Sensation intact. Gait not checked.  PSYCHIATRIC: The patient is alert and oriented x 3.  SKIN: No obvious rash, lesion, or ulcer.  DATA REVIEW:   CBC Recent Labs  Lab 10/28/19 0201  WBC 7.8  HGB 11.6*  HCT 36.8  PLT 264    Chemistries  Recent Labs  Lab 10/26/19 1345 10/28/19 0201  NA 142 142  K 4.2 3.4*  CL 96* 90*  CO2 35* 37*  GLUCOSE 124* 166*  BUN 16 22  CREATININE 0.43* 0.87  CALCIUM 9.6 10.0  MG 1.8  --   AST 17  --   ALT 17  --   ALKPHOS 55  --   BILITOT 0.6  --     Follow-up Information    Lorre MunroeBaity, Regina W, NP. Schedule an appointment as soon as possible for a visit in 1 week(s).   Specialties: Internal Medicine, Emergency Medicine Contact information: [address] [place] [address] [phone]           Management plans discussed with the patient, family (husband) and they are in agreement.  CODE STATUS: DNR   TOTAL TIME TAKING CARE OF THIS PATIENT: 45 minutes.    Delfino LovettVipul Anna-Marie Coller M.D on 10/29/2019 at 10:17 AM  Between 7am to 6pm - Pager - [phone]  After 6pm go to [email or web address] - password [email or web address]  [facility]   CC: Primary care physician; Lorre MunroeBaity, Regina W, NP   Note:  This dictation was prepared with Dragon dictation along with smaller phrase technology. Any transcriptional errors that result from this process are unintentional.

## 2019-10-29 NOTE — Progress Notes (Addendum)
Husband is leaving home and will be here to pick up his wife in 48min  This nurse went over DC & Rx instructions with husband when he picked up wife.

## 2019-10-29 NOTE — Progress Notes (Signed)
Patient is being discharged to home with Hospice care. IV removed. DC & Rx instructions given and patient acknowledged understanding. Husband will pick up patient to transport home. Belongings packed and NT helped patient dress for discharge.

## 2019-10-31 ENCOUNTER — Telehealth: Payer: Self-pay

## 2019-10-31 LAB — CULTURE, BLOOD (ROUTINE X 2)
Culture: NO GROWTH
Culture: NO GROWTH

## 2019-10-31 NOTE — Telephone Encounter (Signed)
Transition Care Management Follow-up Telephone Call  Date of discharge and from where: 10/29/2019, [facility]  How have you been since you were released from the hospital? Patient states that she is feeling much better just dealing with some anxiety issues. Needs to talk with provider about anxiety and and possible hospice consult.   Any questions or concerns? No   Items Reviewed:  Did the pt receive and understand the discharge instructions provided? Yes   Medications obtained and verified? Yes   Any new allergies since your discharge? No   Dietary orders reviewed? Yes  Do you have support at home? Yes   Functional Questionnaire: (I = Independent and D = Dependent) ADLs: I  Bathing/Dressing- I  Meal Prep- I  Eating- I  Maintaining continence- I  Transferring/Ambulation- I  Managing Meds- I  Follow up appointments reviewed:   PCP Hospital f/u appt confirmed? Yes  Scheduled to see Webb Silversmith, NP on 11/02/2019 @ 2:15 pm.  [facility] f/u appt confirmed? N/A   Are transportation arrangements needed? No   If their condition worsens, is the pt aware to call PCP or go to the Emergency Dept.? Yes  Was the patient provided with contact information for the PCP's office or ED? Yes  Was to pt encouraged to call back with questions or concerns? Yes

## 2019-11-02 ENCOUNTER — Encounter: Payer: Self-pay | Admitting: Internal Medicine

## 2019-11-02 ENCOUNTER — Other Ambulatory Visit: Payer: Self-pay

## 2019-11-02 ENCOUNTER — Ambulatory Visit (INDEPENDENT_AMBULATORY_CARE_PROVIDER_SITE_OTHER): Payer: Medicare Other | Admitting: Internal Medicine

## 2019-11-02 VITALS — BP 116/72 | HR 69 | Temp 97.9°F | Wt 148.0 lb

## 2019-11-02 DIAGNOSIS — J9622 Acute and chronic respiratory failure with hypercapnia: Secondary | ICD-10-CM | POA: Diagnosis not present

## 2019-11-02 DIAGNOSIS — J9621 Acute and chronic respiratory failure with hypoxia: Secondary | ICD-10-CM

## 2019-11-02 DIAGNOSIS — J441 Chronic obstructive pulmonary disease with (acute) exacerbation: Secondary | ICD-10-CM | POA: Diagnosis not present

## 2019-11-02 NOTE — Progress Notes (Signed)
Subjective:    Patient ID: Tami Skinner, female    DOB: 09/02/1946, 73 y.o.   MRN: [id number]  HPI  [facility] She went to the ER 10/26/19 with c/o SOB. She was diagnosed with COPD exacerbation and acute on chronic hypoxic and hypercapnic respiratory failure. She did require Bipap. She was discharged home on oral and nebulized steroids, Albuterol and Azithromycin. She was taken off Zofran, Rosuvastatin, Wellbutrin, and Wixela. Hospice was consulted. She has received a hospital bed at her home. She is still not sure if she wants their services, she was too out of it to discuss in the hospital and husband agreed to consult. Since discharge, she reports she is breathing better. She is on her oxygen and using inhalers/neb treatments as prescribed. She has no major concerns other than wanting to know why some of her meds have been d/c'd.   Review of Systems      Past Medical History:  Diagnosis Date  . Anxiety   . Asthmatic bronchitis   . COPD (chronic obstructive pulmonary disease) (HCC)   . Depression   . Diabetes mellitus   . Diverticulitis   . Diverticulosis   . Hyperlipidemia   . Hypertension   . Osteopenia   . Sleep apnea   . Stress-induced cardiomyopathy    a. echo 09/28/2014: EF 45-50%, severe HK of mid-distal anterior, apical and mid-distal inferior walls suggestive of stress induced CM, mildly dilated LA, mildly dilated PASP, mild TR, trivial pericardial effusion    Current Outpatient Medications  Medication Sig Dispense Refill  . albuterol (PROVENTIL HFA;VENTOLIN HFA) 108 (90 Base) MCG/ACT inhaler INHALE 2 PUFFS INTO THE LUNGS EVERY 4 (FOUR) HOURS AS NEEDED FOR WHEEZING OR SHORTNESS OF BREATH. 18 Inhaler 6  . ALPRAZolam (XANAX) 0.5 MG tablet Take 1 tablet (0.5 mg total) by mouth 3 (three) times daily as needed for anxiety. 90 tablet 0  . budesonide (PULMICORT) 0.25 MG/2ML nebulizer solution Take 2 mLs by nebulization 2 (two)  times daily.    . busPIRone (BUSPAR) 10 MG tablet Take 1 tablet (10 mg total) by mouth 3 (three) times daily. 270 tablet 0  . conjugated estrogens (PREMARIN) vaginal cream Apply 0.5g (pea-sized amount) just inside the vaginal introitus with a finger-tip on Monday,Wednesday and Friday night 30 g 12  . feeding supplement, ENSURE ENLIVE, (ENSURE ENLIVE) LIQD Take 237 mLs by mouth 2 (two) times daily between meals. 237 mL 12  . furosemide (LASIX) 20 MG tablet Take 1 tablet (20 mg total) by mouth as needed for edema. 30 tablet 5  . guaiFENesin (MUCINEX) 600 MG 12 hr tablet Take 1 tablet (600 mg total) by mouth 2 (two) times daily. 30 tablet 0  . metoprolol succinate (TOPROL-XL) 50 MG 24 hr tablet TAKE 1 TABLET (50 MG TOTAL) BY MOUTH DAILY. TAKE WITH OR IMMEDIATELY FOLLOWING A MEAL. 90 tablet 0  . nystatin (MYCOSTATIN/NYSTOP) powder Apply topically 3 (three) times daily. 60 g 5  . omeprazole (PRILOSEC) 40 MG capsule TAKE 1 CAPSULE BY MOUTH EVERY DAY (Patient taking differently: Take 40 mg by mouth daily. ) 90 capsule 0  . ONE TOUCH ULTRA TEST test strip 1 EACH BY OTHER ROUTE DAILY AS NEEDED FOR OTHER. 25 each 5  . oxybutynin (DITROPAN-XL) 5 MG 24 hr tablet TAKE 1 TABLET BY MOUTH EVERYDAY AT BEDTIME (Patient taking differently: Take 5 mg by mouth at bedtime. ) 90 tablet 0  . QUEtiapine (SEROQUEL) 25  MG tablet Take 1 tablet (25 mg total) by mouth at bedtime as needed (Insomnia/anxiety). 30 tablet 0  . sertraline (ZOLOFT) 100 MG tablet TAKE 2 TABLETS BY MOUTH TWICE A DAY (Patient taking differently: Take 200 mg by mouth 2 (two) times daily. ) 360 tablet 0  . tiotropium (SPIRIVA HANDIHALER) 18 MCG inhalation capsule Place 1 capsule (18 mcg total) into inhaler and inhale daily. 30 capsule 12   No current facility-administered medications for this visit.    Allergies  Allergen Reactions  . Amoxicillin-Pot Clavulanate     REACTION: stomach sensitive.  Lenice Llamas [Roflumilast]     Severe nausea   .  Erythromycin Base     REACTION: GI upset (cramping and diarrhea)  . Neurontin [Gabapentin]     anxiety    Family History  Problem Relation Age of Onset  . Diabetes Daughter   . Kidney disease Daughter   . Lung cancer Mother        smoked  . COPD Mother        smoked  . COPD Father        smoked  . Prostate cancer Father   . Kidney failure Father   . Breast cancer Maternal Aunt   . Diabetes Maternal Grandmother   . Breast cancer Maternal Grandmother   . Cancer Cousin        colon    Social History   Socioeconomic History  . Marital status: Married    Spouse name: Not on file  . Number of children: Not on file  . Years of education: Not on file  . Highest education level: Not on file  Occupational History  . Not on file  Tobacco Use  . Smoking status: Former Smoker    Packs/day: 0.25    Years: 40.00    Pack years: 10.00    Types: Cigarettes    Quit date: 09/01/2016    Years since quitting: 3.1  . Smokeless tobacco: Never Used  . Tobacco comment: Uses nicotrol inhaler on occasion  Substance and Sexual Activity  . Alcohol use: No  . Drug use: No  . Sexual activity: Not Currently  Other Topics Concern  . Not on file  Social History Narrative   Lives with husband in [place]. 2 dogs. Works at [employer]   Social Determinants of Health   Financial Resource Strain:   . Difficulty of Paying Living Expenses: Not on file  Food Insecurity:   . Worried About Programme researcher, broadcasting/film/video in the Last Year: Not on file  . Ran Out of Food in the Last Year: Not on file  Transportation Needs:   . Lack of Transportation (Medical): Not on file  . Lack of Transportation (Non-Medical): Not on file  Physical Activity:   . Days of Exercise per Week: Not on file  . Minutes of Exercise per Session: Not on file  Stress:   . Feeling of Stress : Not on file  Social Connections:   . Frequency of Communication with Friends and Family: Not on file  . Frequency of Social Gatherings with Friends  and Family: Not on file  . Attends Religious Services: Not on file  . Active Member of Clubs or Organizations: Not on file  . Attends Banker Meetings: Not on file  . Marital Status: Not on file  Intimate Partner Violence:   . Fear of Current or Ex-Partner: Not on file  . Emotionally Abused: Not on file  . Physically Abused: Not  on file  . Sexually Abused: Not on file     Constitutional: Pt reports fatigue. Denies fever, malaise, headache or abrupt weight changes.  HEENT: Denies eye pain, eye redness, ear pain, ringing in the ears, wax buildup, runny nose, nasal congestion, bloody nose, or sore throat. Respiratory: Pt reports shortness of breath. Denies difficulty breathing, cough or sputum production.   Cardiovascular: Denies chest pain, chest tightness, palpitations or swelling in the hands or feet.  Neurological: Pt reports intermittent confusion. Denies dizziness,  difficulty with speech or problems with coordination.  Psych: Pt has a history of anxiety. Denies depression, SI/HI.  No other specific complaints in a complete review of systems (except as listed in HPI above).  Objective:   Physical Exam  BP 116/72   Pulse 69   Temp 97.9 F (36.6 C) (Temporal)   Wt 148 lb (67.1 kg)   SpO2 95%   BMI 23.89 kg/m  Wt Readings from Last 3 Encounters:  11/02/19 148 lb (67.1 kg)  10/26/19 140 lb (63.5 kg)  10/06/19 155 lb (70.3 kg)    General: Appears her stated age, chronically ill appearing, in NAD. Cardiovascular: Normal rate and rhythm. S1,S2 noted.  No murmur, rubs or gallops noted. No JVD or BLE edema.  Pulmonary/Chest: Slightly increased effort. Positive diminished breath sounds with intermittent expiratory wheezing noted. No respiratory distress.  Musculoskeletal: Gait slow and steady with use of rolling walker. Neurological: Alert and oriented. Some difficulty with short term recall.  BMET    Component Value Date/Time   NA 142 10/28/2019 0201   NA 140  09/28/2014 0637   K 3.4 (L) 10/28/2019 0201   K 4.3 09/28/2014 0637   CL 90 (L) 10/28/2019 0201   CL 108 (H) 09/28/2014 0637   CO2 37 (H) 10/28/2019 0201   CO2 26 09/28/2014 0637   GLUCOSE 166 (H) 10/28/2019 0201   GLUCOSE 117 (H) 09/28/2014 0637   BUN 22 10/28/2019 0201   BUN 13 09/28/2014 0637   CREATININE 0.87 10/28/2019 0201   CREATININE 1.17 (H) 12/31/2018 1625   CALCIUM 10.0 10/28/2019 0201   CALCIUM 7.7 (L) 09/28/2014 0637   GFRNONAA >60 10/28/2019 0201   GFRNONAA >60 09/28/2014 0637   GFRAA >60 10/28/2019 0201   GFRAA >60 09/28/2014 0637    Lipid Panel     Component Value Date/Time   CHOL 162 12/31/2018 1625   CHOL 110 09/28/2014 0637   TRIG 94 12/31/2018 1625   TRIG 102 09/28/2014 0637   HDL 77 12/31/2018 1625   HDL 54 09/28/2014 0637   CHOLHDL 2.1 12/31/2018 1625   VLDL 11.8 01/20/2017 1407   VLDL 20 09/28/2014 0637   LDLCALC 67 12/31/2018 1625   LDLCALC 36 09/28/2014 0637    CBC    Component Value Date/Time   WBC 7.8 10/28/2019 0201   RBC 4.09 10/28/2019 0201   HGB 11.6 (L) 10/28/2019 0201   HGB 13.0 09/30/2014 0525   HCT 36.8 10/28/2019 0201   HCT 39.0 09/30/2014 0525   PLT 264 10/28/2019 0201   PLT 234 09/30/2014 0525   MCV 90.0 10/28/2019 0201   MCV 93 09/30/2014 0525   MCH 28.4 10/28/2019 0201   MCHC 31.5 10/28/2019 0201   RDW 13.4 10/28/2019 0201   RDW 14.1 09/30/2014 0525   LYMPHSABS 0.9 10/26/2019 1345   LYMPHSABS 1.3 09/30/2014 0525   MONOABS 0.5 10/26/2019 1345   MONOABS 0.5 09/30/2014 0525   EOSABS 0.3 10/26/2019 1345   EOSABS 0.2 09/30/2014 0525  BASOSABS 0.0 10/26/2019 1345   BASOSABS 0.0 09/30/2014 0525    Hgb A1C Lab Results  Component Value Date   HGBA1C 5.7 (H) 09/22/2019            Assessment & Plan:   [facility] Follow Up for COPD Exacerbation, Acute on Chronic Hypercapneic/Hypoxic Respiratory Failure:  Hospital notes, labs and imaging reviewed She will stay off meds that have been d/c'd She will take  other meds as prescribed She will continue to follow with cardiology, pulmonology for now She will make a decision about wether or not she wants to continue hospice services She has an upcoming appt with psychiatry that she is looking forward to No additional blood work or intervention needed at this time  Will follow. Return precautions discussed [name], NP This visit occurred during the SARS-CoV-2 public health emergency.  Safety protocols were in place, including screening questions prior to the visit, additional usage of staff PPE, and extensive cleaning of exam room while observing appropriate contact time as indicated for disinfecting solutions.

## 2019-11-02 NOTE — Patient Instructions (Signed)

## 2019-11-07 ENCOUNTER — Encounter: Payer: Self-pay | Admitting: Internal Medicine

## 2019-11-07 ENCOUNTER — Telehealth: Payer: Self-pay

## 2019-11-07 ENCOUNTER — Other Ambulatory Visit: Payer: Self-pay

## 2019-11-07 ENCOUNTER — Ambulatory Visit (INDEPENDENT_AMBULATORY_CARE_PROVIDER_SITE_OTHER): Payer: Medicare PPO | Admitting: Psychiatry

## 2019-11-07 ENCOUNTER — Encounter: Payer: Self-pay | Admitting: Psychiatry

## 2019-11-07 DIAGNOSIS — F411 Generalized anxiety disorder: Secondary | ICD-10-CM | POA: Diagnosis not present

## 2019-11-07 DIAGNOSIS — F1320 Sedative, hypnotic or anxiolytic dependence, uncomplicated: Secondary | ICD-10-CM | POA: Diagnosis not present

## 2019-11-07 DIAGNOSIS — F330 Major depressive disorder, recurrent, mild: Secondary | ICD-10-CM | POA: Diagnosis not present

## 2019-11-07 DIAGNOSIS — F410 Panic disorder [episodic paroxysmal anxiety] without agoraphobia: Secondary | ICD-10-CM

## 2019-11-07 MED ORDER — ESCITALOPRAM OXALATE 10 MG PO TABS: 10.0000 mg | ORAL_TABLET | Freq: Every day | ORAL | 1 refills | Status: DC

## 2019-11-07 MED ORDER — QUETIAPINE FUMARATE 50 MG PO TABS: 50.0000 mg | ORAL_TABLET | Freq: Every day | ORAL | 1 refills | Status: DC

## 2019-11-07 MED ORDER — BUSPIRONE HCL 10 MG PO TABS: 10.0000 mg | ORAL_TABLET | Freq: Three times a day (TID) | ORAL | 0 refills | Status: DC

## 2019-11-07 MED ORDER — HYDROXYZINE HCL 25 MG PO TABS: 12.5000 mg | ORAL_TABLET | Freq: Two times a day (BID) | ORAL | 1 refills | Status: DC | PRN

## 2019-11-07 MED ORDER — SERTRALINE HCL 25 MG PO TABS: 25.0000 mg | ORAL_TABLET | Freq: Every day | ORAL | 0 refills | Status: DC

## 2019-11-07 NOTE — Patient Instructions (Addendum)
Please start taking xanax 0.5 mg daily AM and every other night this week  Next week - please take Xanax 0.5 mg daily AM and every two nights  Week 3- Please take Xanax 0.5 mg po daily AM and stop taking the night time dosage.  Taper off Zoloft - I have given you 25 mg for next 10 days - stop taking after 10 days.  Start Lexapro 10 mg daily with supper for anxiety and depression.  Increase Quetiapine 50 mg at bedtime for anxiety and sleep.  Hydroxyzine 12.5-25 mg twice a day as needed for anxiety attacks.  Please continue Buspar as prescribed .
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
## 2019-11-07 NOTE — Telephone Encounter (Signed)
avs mailed out  

## 2019-11-10 ENCOUNTER — Other Ambulatory Visit: Payer: Self-pay | Admitting: Pulmonary Disease

## 2019-11-10 ENCOUNTER — Encounter: Payer: Self-pay | Admitting: Licensed Clinical Social Worker

## 2019-11-10 ENCOUNTER — Other Ambulatory Visit: Payer: Self-pay

## 2019-11-10 ENCOUNTER — Ambulatory Visit (INDEPENDENT_AMBULATORY_CARE_PROVIDER_SITE_OTHER): Payer: Medicare PPO | Admitting: Licensed Clinical Social Worker

## 2019-11-10 DIAGNOSIS — F411 Generalized anxiety disorder: Secondary | ICD-10-CM

## 2019-11-10 NOTE — Telephone Encounter (Signed)
LG please advise on albuterol neb solution, this medication has not been prescribed by you previously.

## 2019-11-10 NOTE — Progress Notes (Signed)
  Virtual Visit via Telephone Note  I connected with Tami Skinner on 11/10/19 at 10:00 AM EST by telephone and verified that I am speaking with the correct person using two identifiers.   I discussed the limitations, risks, security and privacy concerns of performing an evaluation and management service by telephone and the availability of in person appointments. I also discussed with the patient that there may be a patient responsible charge related to this service. The patient expressed understanding and agreed to proceed.  I discussed the assessment and treatment plan with the patient. The patient was provided an opportunity to ask questions and all were answered. The patient agreed with the plan and demonstrated an understanding of the instructions.   The patient was advised to call back or seek an in-person evaluation if the symptoms worsen or if the condition fails to improve as anticipated.  I provided 53 minutes of non-face-to-face time during this encounter.   Heidi Dach, LCSW  THERAPIST PROGRESS NOTE  Session Time: 1000  Participation Level: Active  Behavioral Response: NeatAlertAnxious  Type of Therapy: Individual Therapy  Treatment Goals addressed: Coping  Interventions: Supportive  Summary: Tami Skinner is a 74 y.o. female who presents with continued symptoms related to her diagnosis. Tami Skinner reports doing well since our last session. She reports she was currently having a panic attack and asked LCSW to assist in talking her through it. LCSW encouraged [name] to look around the room and verbalize five things she sees, 4 things she can touch, etc. Tami Skinner was able to do this in the moment and expressed feeling less panicked after utilizing these skills. LCSW encouraged Tami Skinner to utilize these skills next time she is having a panic attack, and also provided her with other variations of these activities in order to manage panic in the moment. Tami Skinner expressed understanding and  agreement with this information. LCSW held space for Tami Skinner to discuss things in her life that have been causing her anxiety and stress. We reviewed ways to challenge negative thoughts, and ways to manage anxiety in the moment moving forward as well.   Suicidal/Homicidal: No  Therapist Response: Tami Skinner continues to work towards her tx goals but has not yet reached them. We will continue to work on improving emotional regulation skills moving forward.   Plan: Return again in 2 weeks.  Diagnosis: Axis I: Generalized Anxiety Disorder    Axis II: No diagnosis    Heidi Dach, LCSW 11/10/2019

## 2019-11-13 ENCOUNTER — Other Ambulatory Visit: Payer: Self-pay | Admitting: Pulmonary Disease

## 2019-11-17 ENCOUNTER — Telehealth: Payer: Self-pay

## 2019-11-17 NOTE — Telephone Encounter (Signed)
pt called states that she hasnt started your medications you gave her. she states she afraid that they will not work and then she will not be able to take the xanax. Pt wanted to speak to your.

## 2019-11-17 NOTE — Telephone Encounter (Signed)
Returned call to patient.  She reports she is worried about making changes as recommended by Clinical research associate during her last visit.  She got the instructions which we sent by mail.  She reports after reading the instructions she does not feel comfortable changing any of her medications.  She wants to stay on the Xanax since she is worried she may get a panic attack and the other medications will not work.  Provided her education that these medications are meant to treat her anxiety and that to take the Xanax only if she has a panic attack and not because she is afraid that she is going to get one.  Advised her to taper off of the Xanax as recommended.  Discussed with patient if she is not interested in medication management then she can be transitioned back to her primary care provider.

## 2019-11-21 ENCOUNTER — Encounter: Payer: Self-pay | Admitting: Internal Medicine

## 2019-11-21 ENCOUNTER — Ambulatory Visit (INDEPENDENT_AMBULATORY_CARE_PROVIDER_SITE_OTHER): Payer: Medicare PPO | Admitting: Internal Medicine

## 2019-11-21 ENCOUNTER — Telehealth: Payer: Self-pay | Admitting: Internal Medicine

## 2019-11-21 ENCOUNTER — Telehealth: Payer: Self-pay | Admitting: Cardiovascular Disease

## 2019-11-21 DIAGNOSIS — J0140 Acute pansinusitis, unspecified: Secondary | ICD-10-CM | POA: Diagnosis not present

## 2019-11-21 DIAGNOSIS — F411 Generalized anxiety disorder: Secondary | ICD-10-CM

## 2019-11-21 MED ORDER — FLUTICASONE PROPIONATE 50 MCG/ACT NA SUSP: 2.0000 | Freq: Every day | NASAL | 6 refills | Status: AC

## 2019-11-21 MED ORDER — ALPRAZOLAM 0.5 MG PO TABS: 0.5000 mg | ORAL_TABLET | Freq: Three times a day (TID) | ORAL | 0 refills | Status: DC | PRN

## 2019-11-21 MED ORDER — DOXYCYCLINE HYCLATE 100 MG PO TABS: 100.0000 mg | ORAL_TABLET | Freq: Two times a day (BID) | ORAL | 0 refills | Status: DC

## 2019-11-21 NOTE — Patient Instructions (Signed)

## 2019-11-21 NOTE — Telephone Encounter (Signed)
error 

## 2019-11-21 NOTE — Telephone Encounter (Signed)
Called patient to schedule a virtual visit per mychart request. Lvm asking her to call office.

## 2019-11-21 NOTE — Progress Notes (Signed)
Virtual Visit via Telephone Note  I connected with Tami Skinner on 11/21/19 at  2:15 PM EST by telephone and verified that I am speaking with the correct person using two identifiers.  Location: Patient: Home Provider: Office   I discussed the limitations, risks, security and privacy concerns of performing an evaluation and management service by telephone and the availability of in person appointments. I also discussed with the patient that there may be a patient responsible charge related to this service. The patient expressed understanding and agreed to proceed.   HPI  Pt reports facial pain and pressure, nasal congestion, sore throat and cough. This started 3 days ago. She is blowing brown/green mucous out of her nose. She denies difficulty swallowing. The cough is productive of brown mucous. She denies eye pain, redness or discharge, ear pain, difficulty swallowing, loss of taste and smell, or increased SOB. She denies fever ,chills or body aches. She has tried Mucinex and her prescribed inhalers/nebulizers with minimal relief. She has not had sick contacts that she is aware of.  She also wants to let me know after talking with psychiatry, she is not interested in weaning off the Xanax and would like that refilled today.  Review of Systems     Past Medical History:  Diagnosis Date  . Anxiety   . Asthmatic bronchitis   . COPD (chronic obstructive pulmonary disease) (HCC)   . Depression   . Diabetes mellitus   . Diverticulitis   . Diverticulosis   . Hyperlipidemia   . Hypertension   . Osteopenia   . Sleep apnea   . Stress-induced cardiomyopathy    a. echo 09/28/2014: EF 45-50%, severe HK of mid-distal anterior, apical and mid-distal inferior walls suggestive of stress induced CM, mildly dilated LA, mildly dilated PASP, mild TR, trivial pericardial effusion    Family History  Problem Relation Age of Onset  . Diabetes Daughter   . Kidney disease Daughter   . Anxiety  disorder Daughter   . Drug abuse Daughter   . Lung cancer Mother        smoked  . COPD Mother        smoked  . COPD Father        smoked  . Prostate cancer Father   . Kidney failure Father   . Breast cancer Maternal Aunt   . Diabetes Maternal Grandmother   . Breast cancer Maternal Grandmother   . Cancer Cousin        colon    Social History   Socioeconomic History  . Marital status: Married    Spouse name: Not on file  . Number of children: Not on file  . Years of education: Not on file  . Highest education level: Not on file  Occupational History  . Not on file  Tobacco Use  . Smoking status: Former Smoker    Packs/day: 0.25    Years: 40.00    Pack years: 10.00    Types: Cigarettes    Quit date: 09/01/2016    Years since quitting: 3.2  . Smokeless tobacco: Never Used  . Tobacco comment: Uses nicotrol inhaler on occasion  Substance and Sexual Activity  . Alcohol use: No  . Drug use: No  . Sexual activity: Not Currently  Other Topics Concern  . Not on file  Social History Narrative   Lives with husband in [place]. 2 dogs.   Social Determinants of Health   Financial Resource Strain:   . Difficulty of  Paying Living Expenses: Not on file  Food Insecurity:   . Worried About Charity fundraiser in the Last Year: Not on file  . Ran Out of Food in the Last Year: Not on file  Transportation Needs:   . Lack of Transportation (Medical): Not on file  . Lack of Transportation (Non-Medical): Not on file  Physical Activity:   . Days of Exercise per Week: Not on file  . Minutes of Exercise per Session: Not on file  Stress:   . Feeling of Stress : Not on file  Social Connections:   . Frequency of Communication with Friends and Family: Not on file  . Frequency of Social Gatherings with Friends and Family: Not on file  . Attends Religious Services: Not on file  . Active Member of Clubs or Organizations: Not on file  . Attends Archivist Meetings: Not on file   . Marital Status: Not on file  Intimate Partner Violence:   . Fear of Current or Ex-Partner: Not on file  . Emotionally Abused: Not on file  . Physically Abused: Not on file  . Sexually Abused: Not on file    Allergies  Allergen Reactions  . Amoxicillin-Pot Clavulanate     REACTION: stomach sensitive.  Newton Pigg [Roflumilast]     Severe nausea   . Erythromycin Base     REACTION: GI upset (cramping and diarrhea)  . Neurontin [Gabapentin]     anxiety     Constitutional: Denies headache, fatigue, fever or  abrupt weight changes.  HEENT:  Positive facial pain, nasal congestion and sore throat. Denies eye redness, ear pain, ringing in the ears, wax buildup, runny nose or bloody nose. Respiratory: Positive cough. Denies difficulty breathing or shortness of breath.  Cardiovascular: Denies chest pain, chest tightness, palpitations or swelling in the hands or feet.   No other specific complaints in a complete review of systems (except as listed in HPI above).  Objective:    Pulmonary/Chest: No respiratory distress. Dry cough noted.      Assessment & Plan:   Acute Sinusitis  Can use a Neti Pot which can be purchased from your local drug store. Flonase 2 sprays each nostril for 3 days and then as needed. eRx for Doxycycline BID for 10 days  Anxiety:  Chronic I don't think she should wean off Xanax- she is under palliative care at this time Xanax refilled today  RTC as needed or if symptoms persist. Webb Silversmith, NP    I discussed the assessment and treatment plan with the patient. The patient was provided an opportunity to ask questions and all were answered. The patient agreed with the plan and demonstrated an understanding of the instructions.   The patient was advised to call back or seek an in-person evaluation if the symptoms worsen or if the condition fails to improve as anticipated.  I provided 5:44 minutes of non-face-to-face time during this  encounter.   Webb Silversmith, NP

## 2019-11-22 ENCOUNTER — Encounter: Payer: Self-pay | Admitting: Internal Medicine

## 2019-11-23 ENCOUNTER — Telehealth (INDEPENDENT_AMBULATORY_CARE_PROVIDER_SITE_OTHER): Payer: Medicare PPO | Admitting: Family

## 2019-11-23 ENCOUNTER — Telehealth: Payer: Self-pay

## 2019-11-23 ENCOUNTER — Encounter: Payer: Self-pay | Admitting: Family

## 2019-11-23 ENCOUNTER — Other Ambulatory Visit: Payer: Self-pay

## 2019-11-23 VITALS — BP 138/85 | HR 74 | Ht 63.0 in | Wt 156.0 lb

## 2019-11-23 DIAGNOSIS — I10 Essential (primary) hypertension: Secondary | ICD-10-CM

## 2019-11-23 DIAGNOSIS — R600 Localized edema: Secondary | ICD-10-CM

## 2019-11-23 DIAGNOSIS — E782 Mixed hyperlipidemia: Secondary | ICD-10-CM | POA: Diagnosis not present

## 2019-11-23 DIAGNOSIS — I5181 Takotsubo syndrome: Secondary | ICD-10-CM | POA: Diagnosis not present

## 2019-11-23 DIAGNOSIS — R0600 Dyspnea, unspecified: Secondary | ICD-10-CM

## 2019-11-23 DIAGNOSIS — R0609 Other forms of dyspnea: Secondary | ICD-10-CM

## 2019-11-23 MED ORDER — METOPROLOL SUCCINATE ER 50 MG PO TB24: 50.0000 mg | ORAL_TABLET | Freq: Every day | ORAL | 3 refills | Status: AC

## 2019-11-23 NOTE — Telephone Encounter (Signed)
Lillia Abed nurse with [facility] saw pt earlier today; pt is having almost constant panic attacks. Pt was getting over a panic attack; temp was normal; BP 152/84. Lillia Abed said pts upper, middle, and lower lobes had wheezing. Pt took neb treatment but did not improve wheezing. Pt has prod cough with green sputum.pt has hx of pneumonia and Lillia Abed thinks pt does need assessment and possible CXR. Lillia Abed said some of pts breathing issues may be related to her panic attacks but pt is also having wheezing. Lillia Abed will advise pt to go to [facility] or ED. FYI to Pamala Hurry NP.

## 2019-11-23 NOTE — Progress Notes (Signed)
Virtual Visit via Telephone Note   This visit type was conducted due to national recommendations for restrictions regarding the COVID-19 Pandemic (e.g. social distancing) in an effort to limit this patient's exposure and mitigate transmission in our community.  Due to her co-morbid illnesses, this patient is at least at moderate risk for complications without adequate follow up.  This format is felt to be most appropriate for this patient at this time.  The patient did not have access to video technology/had technical difficulties with video requiring transitioning to audio format only (telephone).  All issues noted in this document were discussed and addressed.  No physical exam could be performed with this format.  Please refer to the patient's chart for her  consent to telehealth for [facility].   Date:  11/23/2019   ID:  Tami Skinner, DOB [birth year], MRN [id number]  Patient Location: Home Provider Location: Office  PCP:  Lorre Munroe, NP  Cardiologist:  Lorine Bears, MD  Electrophysiologist:  None   Evaluation Performed:  Follow-Up Visit  Chief Complaint:  HTN, anxiety  History of Present Illness:    Tami Skinner is a 74 y.o. female with a hx of stress induced cardiomyopathy, HTN, HLD, anxiety, COPD. She was last seen by Dr. Kirke Corin 10/05/2018.   She was hospitalized 09/2014 with chest pain, SOB, mildly elevated cardiac enzymes. Cardiac cath showed mild nonobstructive CAD. EF 40-45% with wall mortion abnormality suggestive of stress-induced cardiomyopathy. She has a hx of COPD and quit smoking in 2017. Subsequent echocardiogram 07/2017 with EF 60-65%, gr1DD, and no significant valvular abnormalities.   Seen by PCP via telephone 11/21/19 for URI. She was started on antibiotics. Reports she is feeling somewhat better.  She has PT, OT, and a home health RN that come to check on her. She has been enrolled in a hospice program.   Her chief complaint today is anxiety. Tells me  she has panic attacks that make her feel very short of breath. She is in the process of hiring someone to come stay with her at night for extra support.  Reports no chest pain, pressure, tightness. No SOB at rest. Reports her chronic DOE is stable at her baseline. No lightheadedness, dizziness, near-syncope. Home health staff check her blood pressure regularly and she tells me it is good and "on the low side". She denies LE edema, orthopnea, PND. She has only had to use her PRN Lasix twice since she was last seen 1 year ago.   The patient does not have symptoms concerning for COVID-19 infection (fever, chills, cough, or new shortness of breath).    Past Medical History:  Diagnosis Date  . Anxiety   . Asthmatic bronchitis   . COPD (chronic obstructive pulmonary disease) (HCC)   . Depression   . Diabetes mellitus   . Diverticulitis   . Diverticulosis   . Hyperlipidemia   . Hypertension   . Osteopenia   . Sleep apnea   . Stress-induced cardiomyopathy    a. echo 09/28/2014: EF 45-50%, severe HK of mid-distal anterior, apical and mid-distal inferior walls suggestive of stress induced CM, mildly dilated LA, mildly dilated PASP, mild TR, trivial pericardial effusion   Past Surgical History:  Procedure Laterality Date  . ABDOMINAL HYSTERECTOMY     partial  . BREAST BIOPSY Right    neg  . CARDIAC CATHETERIZATION  09/29/2014   [facility]  . NASAL SINUS SURGERY       Current Meds  Medication Sig  .  albuterol (PROVENTIL HFA;VENTOLIN HFA) 108 (90 Base) MCG/ACT inhaler INHALE 2 PUFFS INTO THE LUNGS EVERY 4 (FOUR) HOURS AS NEEDED FOR WHEEZING OR SHORTNESS OF BREATH.  Marland Kitchen albuterol (PROVENTIL) (2.5 MG/3ML) 0.083% nebulizer solution TAKE 3 MLS (2.5 MG TOTAL) BY NEBULIZATION EVERY 6 (SIX) HOURS AS NEEDED FOR WHEEZING OR SHORTNESS OF BREATH. (Patient taking differently: every 6 (six) hours as needed. )  . ALPRAZolam (XANAX) 0.5 MG tablet Take 1 tablet (0.5 mg total) by mouth 3 (three) times daily as  needed for anxiety.  . budesonide (PULMICORT) 0.25 MG/2ML nebulizer solution Take 2 mLs by nebulization 2 (two) times daily.  . busPIRone (BUSPAR) 10 MG tablet Take 1 tablet (10 mg total) by mouth 3 (three) times daily.  Marland Kitchen conjugated estrogens (PREMARIN) vaginal cream Apply 0.5g (pea-sized amount) just inside the vaginal introitus with a finger-tip on Monday,Wednesday and Friday night  . doxycycline (VIBRA-TABS) 100 MG tablet Take 1 tablet (100 mg total) by mouth 2 (two) times daily.  . feeding supplement, ENSURE ENLIVE, (ENSURE ENLIVE) LIQD Take 237 mLs by mouth 2 (two) times daily between meals.  . fluticasone (FLONASE) 50 MCG/ACT nasal spray Place 2 sprays into both nostrils daily.  . furosemide (LASIX) 20 MG tablet Take 1 tablet (20 mg total) by mouth as needed for edema.  Marland Kitchen guaiFENesin (MUCINEX) 600 MG 12 hr tablet Take 1 tablet (600 mg total) by mouth 2 (two) times daily.  . metoprolol succinate (TOPROL-XL) 50 MG 24 hr tablet Take 1 tablet (50 mg total) by mouth daily. Take with or immediately following a meal.  . nystatin (MYCOSTATIN/NYSTOP) powder Apply topically 3 (three) times daily.  . ONE TOUCH ULTRA TEST test strip 1 EACH BY OTHER ROUTE DAILY AS NEEDED FOR OTHER.  . QUEtiapine (SEROQUEL) 50 MG tablet Take 1 tablet (50 mg total) by mouth at bedtime.  . sertraline (ZOLOFT) 100 MG tablet Take 100 mg by mouth 2 (two) times daily.  Marland Kitchen SPIRIVA HANDIHALER 18 MCG inhalation capsule INHALE 1 CAPSULE VIA HANDIHALER ONCE DAILY AT THE SAME TIME EVERY DAY  . [DISCONTINUED] metoprolol succinate (TOPROL-XL) 50 MG 24 hr tablet TAKE 1 TABLET (50 MG TOTAL) BY MOUTH DAILY. TAKE WITH OR IMMEDIATELY FOLLOWING A MEAL.     Allergies:   Amoxicillin-pot clavulanate, Daliresp [roflumilast], Erythromycin base, and Neurontin [gabapentin]   Social History   Tobacco Use  . Smoking status: Former Smoker    Packs/day: 0.25    Years: 40.00    Pack years: 10.00    Types: Cigarettes    Quit date: 09/01/2016     Years since quitting: 3.2  . Smokeless tobacco: Never Used  . Tobacco comment: Uses nicotrol inhaler on occasion  Substance Use Topics  . Alcohol use: No  . Drug use: No    Family Hx: The patient's family history includes Anxiety disorder in her daughter; Breast cancer in her maternal aunt and maternal grandmother; COPD in her father and mother; Cancer in her cousin; Diabetes in her daughter and maternal grandmother; Drug abuse in her daughter; Kidney disease in her daughter; Kidney failure in her father; Lung cancer in her mother; Prostate cancer in her father.  ROS:   Please see the history of present illness.    Review of Systems  Constitution: Negative for chills, fever and malaise/fatigue.  Cardiovascular: Positive for dyspnea on exertion. Negative for chest pain, leg swelling, near-syncope, orthopnea, palpitations and syncope.  Respiratory: Negative for cough, shortness of breath and wheezing.   Gastrointestinal: Negative for nausea and  vomiting.  Neurological: Negative for dizziness, light-headedness and weakness.  Psychiatric/Behavioral: The patient is nervous/anxious.    All other systems reviewed and are negative.  Prior CV studies:   The following studies were reviewed today:  Echocardiogram 07/2017 Study Conclusions - Left ventricle: The cavity size was normal. Systolic function was   normal. The estimated ejection fraction was in the range of 60%   to 65%. Wall motion was normal; there were no regional wall   motion abnormalities. Doppler parameters are consistent with   abnormal left ventricular relaxation (grade 1 diastolic   dysfunction). - Left atrium: The atrium was normal in size. - Right ventricle: Systolic function was normal. - Pulmonary arteries: Systolic pressure was within the normal   range.  Labs/Other Tests and Data Reviewed:    EKG:  An ECG dated 11/01/19 was personally reviewed today and demonstrated:  SR 89 bpm with L axis deviation and no acute  ST/T wave changes  Recent Labs: 09/23/2019: TSH 0.828 10/26/2019: ALT 17; B Natriuretic Peptide 410.0; Magnesium 1.8 10/28/2019: BUN 22; Creatinine, Ser 0.87; Hemoglobin 11.6; Platelets 264; Potassium 3.4; Sodium 142   Recent Lipid Panel Lab Results  Component Value Date/Time   CHOL 162 12/31/2018 04:25 PM   CHOL 110 09/28/2014 06:37 AM   TRIG 94 12/31/2018 04:25 PM   TRIG 102 09/28/2014 06:37 AM   HDL 77 12/31/2018 04:25 PM   HDL 54 09/28/2014 06:37 AM   CHOLHDL 2.1 12/31/2018 04:25 PM   LDLCALC 67 12/31/2018 04:25 PM   LDLCALC 36 09/28/2014 06:37 AM   LDLDIRECT 144.4 10/11/2013 11:02 AM    Wt Readings from Last 3 Encounters:  11/23/19 156 lb (70.8 kg)  11/02/19 148 lb (67.1 kg)  10/26/19 140 lb (63.5 kg)    Objective:    Vital Signs:  BP 138/85 (BP Location: Left Arm, Patient Position: Sitting)   Pulse 74   Ht 5\' 3"  (1.6 m)   Wt 156 lb (70.8 kg) Comment: Estimated by patient-does not have a scale at home  BMI 27.63 kg/m    VITAL SIGNS:  reviewed  ASSESSMENT & PLAN:    1. Hx of stress induced cardiomyopathy - Most recent EF normal 2018. She is on beta blocker. No ARB secondary to hx of hypotension. No SOB, DOE, orthopnea, edema - no indication for repeat echocardiogram at this time.  2. HTN - Reports BP has been well controlled at home. Continue Toprol 50mg  daily.  3. HLD - Lipid panel 12/31/18 LDL 67 at goal of <70. Tells me the psychiatrist took her off of her cholesterol medication.  Unclear why it was discontinued. Would recommend continuation if she had no adverse effects. She tells me she will follow up with her PCP.  4. LE edema - Well controlled. 2 episodes over the last year that were resolved with PRN Lasix 20mg . Continue low sodium diet. Continue to elevate lower extremities when sitting.  5. DOE - Chronic longstanding problem. Likely multifactorial deconditioning and COPD. Continues to follow with pulmonology. As she has no orthopnea, edema, nor PND and she  reports this is at her baseline will not pursue echocardiogram at this time.   COVID-19 Education: The signs and symptoms of COVID-19 were discussed with the patient and how to seek care for testing (follow up with PCP or arrange E-visit).  The importance of social distancing was discussed today.  Time:   Today, I have spent 15 minutes with the patient with telehealth technology discussing the above problems.  Medication Adjustments/Labs and Tests Ordered: Current medicines are reviewed at length with the patient today.  Concerns regarding medicines are outlined above.   Tests Ordered: No orders of the defined types were placed in this encounter.  Medication Changes: Meds ordered this encounter  Medications  . metoprolol succinate (TOPROL-XL) 50 MG 24 hr tablet    Sig: Take 1 tablet (50 mg total) by mouth daily. Take with or immediately following a meal.    Dispense:  90 tablet    Refill:  3    Order Specific Question:   Supervising Provider    Answer:   Richardo Priest [[id number]]   Follow Up:  In Person in 1 year(s) with Dr. Fletcher Anon or APP  Signed, Loel Dubonnet, NP  11/23/2019 2:06 PM    [place]

## 2019-11-23 NOTE — Patient Instructions (Signed)
Medication Instructions:  No medication changes today.  *If you need a refill on your cardiac medications before your next appointment, please call your pharmacy*  Lab Work: No lab work today  If you have labs (blood work) drawn today and your tests are completely normal, you will receive your results only by: Marland Kitchen MyChart Message (if you have MyChart) OR . A paper copy in the mail If you have any lab test that is abnormal or we need to change your treatment, we will call you to review the results.  Testing/Procedures: I reviewed your EKG from December this year and it looked good!  Follow-Up: At [facility], you and your health needs are our priority.  As part of our continuing mission to provide you with exceptional heart care, we have created designated Provider Care Teams.  These Care Teams include your primary Cardiologist (physician) and Advanced Practice Providers (APPs -  Physician Assistants and Nurse Practitioners) who all work together to provide you with the care you need, when you need it.  Your next appointment:   1 year(s)  The format for your next appointment:   In Person  Provider:    You may see Lorine Bears, MD or one of the following Advanced Practice Providers on your designated Care Team:    Nicolasa Ducking, NP  Eula Listen, PA-C  Marisue Ivan, PA-C   Other Instructions

## 2019-11-23 NOTE — Telephone Encounter (Signed)
Noted, agree with advice given. She should not be seen here in case she has COVID.

## 2019-11-24 ENCOUNTER — Ambulatory Visit: Payer: Medicare Other | Admitting: Cardiovascular Disease

## 2019-11-25 ENCOUNTER — Telehealth: Payer: Self-pay

## 2019-11-25 NOTE — Telephone Encounter (Signed)
[place] RECORD AccessNurse Patient Name: Tami Skinner Gender: Female DOB: 15-Oct-1946 Age: 74 Y 2 M 16 D Return Phone Number: [phone] (Primary) Address: City/State/Zip: [place] [address] Client Tami Skinner - Client Client Site [place] Physician Webb Silversmith - NP Contact Type Call Who Is Calling Patient / Member / Family / Caregiver Call Type Triage / Clinical Relationship To Patient Self Return Phone Number [phone] (Primary) Chief Complaint Medication Question (non symptomatic) Reason for Call Symptomatic / Request for [place] states she is taking xanax for anxiety and has a medication question. Translation No Nurse Assessment Nurse: Raenette Rover, RN, Zella Ball Date/Time (Eastern Time): 11/24/2019 5:58:18 PM Confirm and document reason for call. If symptomatic, describe symptoms. ---Caller states she is taking Xanax for anxiety and has a medication question. States taking Xanax for over 30 years and was sent to another doctor and started on Hydroxyzine she is taking both of them. She wants to know if they will interact or are safe to take together. Has the patient had close contact with a person known or suspected to have the novel coronavirus illness OR traveled / lives in area with major community spread (including international travel) in the last 14 days from the onset of symptoms? * If Asymptomatic, screen for exposure and travel within the last 14 days. ---No Does the patient have any new or worsening symptoms? ---No Please document clinical information provided and list any resource used. ---Per Drugs.com no interactions were found between Alprazolam and Hydroxyzine. This does not necessarily mean no interactions exist. Always consult with your provider. The hydroxyzine may cause some dizziness, drowsiness as  well as the Xanax. Guidelines Guideline Title Affirmed Question Affirmed Notes Nurse Date/Time (Eastern Time) Depression [1] Depression AND [2] worsening (e.g.,sleeping poorly, less able to do activities of daily living) [place], [place], Zella Ball 11/24/2019 6:12:58 PM Disp. Time Eilene Ghazi Time) Disposition Final User 11/24/2019 6:22:56 PM See PCP within 24 Hours Yes Raenette Rover, RN, Zella Ball PLEASE NOTE: All timestamps contained within this report are represented as [place] Standard Time. CONFIDENTIALTY NOTICE: This fax transmission is intended only for the addressee. It contains information that is legally privileged, confidential or otherwise protected from use or disclosure. If you are not the intended recipient, you are strictly prohibited from reviewing, disclosing, copying using or disseminating any of this information or taking any action in reliance on or regarding this information. If you have received this fax in error, please notify us immediately by telephone so that we can arrange for its return to [place]. Phone: [phone], Toll-Free: [phone], Fax: [phone] Page: 2 of 2 Call Id: [id number] [place] Disagree/Comply Comply Caller Understands Yes PreDisposition Call Pharmacist Care Advice Given Per Guideline SEE PCP WITHIN 24 HOURS: * If patient has a private psychiatrist, psychologist or counselor, recommend that the caller speak with this mental health professional in the next 24 hours. * IF OFFICE WILL BE OPEN: You need to be seen within the next 24 hours. Call your doctor (or NP/PA) when the office opens and make an appointment. * People with depression do get through this -- even people who feel as badly as you feel now. You can be helped. * Encourage the caller to talk about his/her problems and feelings. CALL BACK IF: * Offer hope. * You become worse. * You feel like harming yourself CARE ADVICE given per Depression (Adult) guideline. [place]  HEALTH  PROFESSIONAL WITHIN 24 HOURS: Referrals REFERRED TO PCP OFFICE

## 2019-11-25 NOTE — Telephone Encounter (Signed)
I see that the hydroxyzine as well as other medications for mood have been prescribed by Dr. Abelina Bachelor. I recommend she check with their office to see if she should be decreasing her Xanax.

## 2019-11-25 NOTE — Telephone Encounter (Signed)
Pamala Hurry NP is out of office with no access to in box. Will send note to Harlin Heys FNP who is in office.

## 2019-11-26 ENCOUNTER — Other Ambulatory Visit: Payer: Self-pay | Admitting: Pulmonary Disease

## 2019-11-28 ENCOUNTER — Ambulatory Visit: Payer: Medicare Other | Admitting: Psychiatry

## 2019-11-28 ENCOUNTER — Other Ambulatory Visit: Payer: Self-pay

## 2019-11-29 ENCOUNTER — Ambulatory Visit (INDEPENDENT_AMBULATORY_CARE_PROVIDER_SITE_OTHER): Payer: Medicare PPO | Admitting: Pulmonary Disease

## 2019-11-29 DIAGNOSIS — J449 Chronic obstructive pulmonary disease, unspecified: Secondary | ICD-10-CM

## 2019-11-29 DIAGNOSIS — Z66 Do not resuscitate: Secondary | ICD-10-CM

## 2019-11-29 DIAGNOSIS — J9611 Chronic respiratory failure with hypoxia: Secondary | ICD-10-CM | POA: Diagnosis not present

## 2019-11-29 DIAGNOSIS — F411 Generalized anxiety disorder: Secondary | ICD-10-CM | POA: Diagnosis not present

## 2019-11-29 MED ORDER — BREZTRI AEROSPHERE 160-9-4.8 MCG/ACT IN AERO: 2.0000 | INHALATION_SPRAY | Freq: Two times a day (BID) | RESPIRATORY_TRACT | 11 refills | Status: DC

## 2019-11-29 NOTE — Progress Notes (Signed)
Subjective:    Patient ID: Tami Skinner, female    DOB: [birth year], 74 y.o.   MRN: [id number] Virtual Visit Via Video or Telephone Note:   This visit type was conducted due to national recommendations for restrictions regarding the COVID-19 pandemic .  This format is felt to be most appropriate for this patient at this time.  All issues noted in this document were discussed and addressed.  No physical exam was performed (except for noted visual exam findings with Video Visits).    I connected with Tami Skinner by  telephone at 2:35 PM and verified that I was speaking with the correct person using two identifiers. Location patient: home Location provider: [facility] Persons participating in the virtual visit: patient, physician   I discussed the limitations, risks, security and privacy concerns of performing an evaluation and management service by video and the availability of in person appointments. The patient expressed understanding and agreed to proceed.  HPI Ms. Felling is a 74 year old former smoker with a history of very severe COPD stage IV who my first evaluated 13 September 2019.  She had been following with Dr. Merton Border and sequently with Dr. Patricia Pesa.  I saw her as an acute visit at that time the patient was noted to be in an acute exacerbation of COPD, had asterixis indicating elevated PaCO2 and had mental status change.  She was admitted to [facility] and was treated for the exacerbation and had a short course of noninvasive ventilation which she later refused to wear.  She was discharged on 24 November after treatment.  With home health care services at that time.  23 December she was readmitted with yet another exacerbation.  She had acute on chronic respiratory failure and was subsequently discharged on 26 December home hospice.  However at that time she felt that she was not ready for hospice and canceled the services.  Since then she has had increased  problems with anxiety she has been on long-term Xanax.  She had treatment with doxycycline as an outpatient which she states is giving her too much nausea.  She is ambivalent about hospice services which she most certainly qualifies for but at the same time very certain that she would not want intubation, mechanical ventilation or CPR.  She seems to be befuddled by her medications.  She is currently on Wixela, Spiriva, and Pulmicort via nebulization treatment.  He does not describe any fevers, chills or sweats.  She has not had any dyspnea over baseline however her dyspnea is very limiting.  No lower extremity edema.  She does report nasal dryness with the oxygen.  She does not have a humidifier bottle for the oxygen.  Please refer to my note of 22 September 2019 for details.   Review of Systems A 10 point review of systems was performed and it is as noted above otherwise negative.    Objective:   Physical Exam  No physical exam performed as the visit was via telephone.  Patient did not exhibit conversational dyspnea during the visit.      Assessment & Plan:  Very severe COPD class IV, with recent exacerbation Chronic hypoxic respiratory failure on oxygen liters per minute Not getting relief from [place] and Spiriva suspect due to dry powdered inhaler formulation Start Breztri 2 inhalations twice a day Continue albuterol as needed Discontinue doxycycline as does not appear to have purulent infection Doxycycline also causing side effects (nausea/anorexia) Continue oxygen at 2 L/min Humidifier bottle  for the oxygen Follow-up in 4 to 6 weeks and call sooner should any difficulties arise . Generalized anxiety disorder She has been dependent on the alprazolam per records Defer to mental health professional and/or primary provider Consider discontinuation of BuSpar  DNR/DNI status  Advised the patient to reconsider palliative/hospice services   Total visit time 20 minutes  C. Danice Goltz, MD [facility]   *This note was dictated using voice recognition software/Dragon.  Despite best efforts to proofread, errors can occur which can change the meaning.  Any change was purely unintentional.

## 2019-11-29 NOTE — Patient Instructions (Addendum)
1.  We will try a new medication called Breztri, 2 inhalations twice a day.  This is a maintenance medication.  Rinse your mouth well after using.  2.  Once you get the Breztri do not use budesonide (Pulmicort) or Spiriva.  You may still use your albuterol (inhaler or nebulizer) AS NEEDED.  3.  We have asked [employer] to send you a humidifier bottle for your oxygen.  4.  We will see you in follow-up in 4 to 6 weeks call sooner if you have any difficulties.  5.  I would discontinue the doxycycline as it is causing more problems than benefiting you.

## 2019-11-30 ENCOUNTER — Other Ambulatory Visit: Payer: Self-pay | Admitting: Psychiatry

## 2019-11-30 DIAGNOSIS — F411 Generalized anxiety disorder: Secondary | ICD-10-CM

## 2019-11-30 DIAGNOSIS — F330 Major depressive disorder, recurrent, mild: Secondary | ICD-10-CM

## 2019-12-01 ENCOUNTER — Other Ambulatory Visit: Payer: Self-pay | Admitting: Psychiatry

## 2019-12-01 DIAGNOSIS — F411 Generalized anxiety disorder: Secondary | ICD-10-CM

## 2019-12-01 NOTE — Telephone Encounter (Signed)
Left a detailed message for patient, advised that if she needed anything further with this medication request to contact Dr Hartley Barefoot office. If any questions can contact our office back. Will close this encounter.

## 2019-12-01 NOTE — Telephone Encounter (Signed)
I have already had this discussion with her. I will continue the Xanax as prescribed, no increase in dose or quantity. If she thinks the Hydroxyzine helps her anxiety, I would recommend she take it according to Dr. Elvera Maria direction. If it doesn't help, she may just want to stop it.

## 2019-12-01 NOTE — Telephone Encounter (Signed)
Pt called back. She said she is not going back to Dr Cheral Marker. She is scheduled to see the counselor Adelina Mings at [facility]. She has not been on the hydroxyzine she thinks because Rene Kocher said not to take it.   Asking if Rene Kocher will do the xanax rx.

## 2019-12-02 ENCOUNTER — Ambulatory Visit (INDEPENDENT_AMBULATORY_CARE_PROVIDER_SITE_OTHER): Payer: Medicare PPO | Admitting: Licensed Clinical Social Worker

## 2019-12-02 ENCOUNTER — Other Ambulatory Visit: Payer: Self-pay

## 2019-12-02 ENCOUNTER — Encounter: Payer: Self-pay | Admitting: Licensed Clinical Social Worker

## 2019-12-02 DIAGNOSIS — F411 Generalized anxiety disorder: Secondary | ICD-10-CM

## 2019-12-02 NOTE — Progress Notes (Signed)
  Virtual Visit via Video Note  I connected with Tami Skinner on 12/02/19 at 11:00 AM EST by a video enabled telemedicine application and verified that I am speaking with the correct person using two identifiers.   I discussed the limitations of evaluation and management by telemedicine and the availability of in person appointments. The patient expressed understanding and agreed to proceed.   I discussed the assessment and treatment plan with the patient. The patient was provided an opportunity to ask questions and all were answered. The patient agreed with the plan and demonstrated an understanding of the instructions.   The patient was advised to call back or seek an in-person evaluation if the symptoms worsen or if the condition fails to improve as anticipated.  I provided 45 minutes of non-face-to-face time during this encounter.   Heidi Dach, LCSW   THERAPIST PROGRESS NOTE  Session Time: [phone] Participation Level: Active  Behavioral Response: CasualAlertAnxious  Type of Therapy: Individual Therapy  Treatment Goals addressed: Coping  Interventions: Supportive  Summary: Tami Skinner is a 74 y.o. female who presents with continued symptoms related to her diagnosis. Tami Skinner reports doing well since our last session. Tami Skinner reports having more panic attacks recently than she has in the past. She reports being unsure why she is having this increase in symptoms and noted they are not usually at the same times or over the same things. LCSW validated this idea, and encouraged Tami Skinner to try the coping skills we've discussed in previous sessions, and also introduced several other coping skills today. We discussed focusing all of your attention and energy on one object and discussed how to utilize that in the moment. We discussed utilizing a mantra that tells Tami Skinner she is not having difficulty breathing but that she is having a panic attack. And we discussed utilizing breathing exercises as well.  Tami Skinner expressed understanding and agreement with all information presented. Tami Skinner also noted she has come to the conclusion that she is not ready to come off the Xanax, and isn't ready to start that process over. LCSW validated this idea and encouraged [name] to discuss this with her MD in order to make the best decision. LCSW briefly provided information on the long-term effects of Xanax, and encouraged Tami Skinner to [employer] as well. Tami Skinner expressed understanding and agreement.   Suicidal/Homicidal: No   Therapist Response: Tami Skinner continues to work towards her tx goals but has not yet reached them. We will continue to work on distress tolerance skills and emotional regulation skills moving forward.   Plan: Return again in 2 weeks.  Diagnosis: Axis I: Generalized Anxiety Disorder    Axis II: No diagnosis    Heidi Dach, LCSW 12/02/2019

## 2019-12-05 ENCOUNTER — Other Ambulatory Visit: Payer: Self-pay | Admitting: Internal Medicine

## 2019-12-06 ENCOUNTER — Telehealth: Payer: Self-pay | Admitting: Pulmonary Disease

## 2019-12-06 NOTE — Telephone Encounter (Signed)
Ashly returned my call. He stated that the patients husband wanted to be contacted on his cell phone. He wanted to fax paperwork over but I let him know to hold off until we heard back from the patient. I will update him after we hear back from the patient.

## 2019-12-06 NOTE — Telephone Encounter (Signed)
LM to confirm with patient if this is what she wanted to do. Will contact Ashly.

## 2019-12-06 NOTE — Telephone Encounter (Signed)
LM to update Ashly on status of patient.

## 2019-12-06 NOTE — Telephone Encounter (Signed)
Spoke with patient and she informed me that she did not inquire about this. She was told about this during her oxygen drop off yesterday. Patient stated she is interested if it will help and would like more information on this. Please advise.

## 2019-12-07 NOTE — Telephone Encounter (Signed)
This was tried during her most recent admission and she did not tolerate well.  The version that she tried was the more sophisticated machine in the hospital which has the same settings (BiPAP/AVAPS).  She would have to undergo an arterial blood gas test prior to be able to qualify for this.  For the most part the effects are short-lived.  If she wants to try it we could put her through the qualification steps which would require that arterial blood gas.

## 2019-12-07 NOTE — Telephone Encounter (Signed)
Spoke with Tami Skinner and she stated that she would like to wait and speak with Dr. Jayme Cloud on her next appointment. Informed Ashly from [place] of this information as well.

## 2019-12-07 NOTE — Telephone Encounter (Signed)
LVMTCB x 1

## 2019-12-16 ENCOUNTER — Telehealth: Payer: Self-pay | Admitting: Internal Medicine

## 2019-12-16 ENCOUNTER — Other Ambulatory Visit: Payer: Self-pay

## 2019-12-16 ENCOUNTER — Encounter: Payer: Self-pay | Admitting: Licensed Clinical Social Worker

## 2019-12-16 ENCOUNTER — Ambulatory Visit (INDEPENDENT_AMBULATORY_CARE_PROVIDER_SITE_OTHER): Payer: Medicare PPO | Admitting: Licensed Clinical Social Worker

## 2019-12-16 DIAGNOSIS — F411 Generalized anxiety disorder: Secondary | ICD-10-CM | POA: Diagnosis not present

## 2019-12-16 DIAGNOSIS — F330 Major depressive disorder, recurrent, mild: Secondary | ICD-10-CM | POA: Diagnosis not present

## 2019-12-16 DIAGNOSIS — F410 Panic disorder [episodic paroxysmal anxiety] without agoraphobia: Secondary | ICD-10-CM

## 2019-12-16 DIAGNOSIS — F1320 Sedative, hypnotic or anxiolytic dependence, uncomplicated: Secondary | ICD-10-CM

## 2019-12-16 NOTE — Telephone Encounter (Signed)
Sandy with [facility] Called today requesting verbal orders.   She would like to Move the patient's speech therapy evaluation  to next week    Sandy's Phone - [phone]

## 2019-12-16 NOTE — Telephone Encounter (Signed)
Ok to move speech therapy eval.

## 2019-12-16 NOTE — Progress Notes (Signed)
  Virtual Visit via Video Note  I connected with Tami Skinner on 12/16/19 at 11:00 AM EST by a video enabled telemedicine application and verified that I am speaking with the correct person using two identifiers.   I discussed the limitations of evaluation and management by telemedicine and the availability of in person appointments. The patient expressed understanding and agreed to proceed.  I discussed the assessment and treatment plan with the patient. The patient was provided an opportunity to ask questions and all were answered. The patient agreed with the plan and demonstrated an understanding of the instructions.   The patient was advised to call back or seek an in-person evaluation if the symptoms worsen or if the condition fails to improve as anticipated.  I provided 45 minutes of non-face-to-face time during this encounter.   Tami Dach, LCSW   THERAPIST PROGRESS NOTE  Session Time: 1100  Participation Level: Active  Behavioral Response: NeatAlertAnxious  Type of Therapy: Individual Therapy  Treatment Goals addressed: Coping  Interventions: Supportive  Summary: Tami Skinner is a 74 y.o. female who presents with continued symptoms related to her diagnosis. Tami Skinner reports things have been "up and down," since the last time we spoke. Tami Skinner reports she was able to get another sitter that stays over night with her, which has really allowed Tami Skinner to feel more secure and safe in her home. LCSW validated this information and held space for Tami Skinner to discuss her feelings on the situation as well. Tami Skinner reports she also has a Child psychotherapist coming to the home, and she was able to have the Child psychotherapist meet with her husband to "talk to him about being nicer." Tami Skinner stated this was positive until her husband got mad at her for sharing that information with the Child psychotherapist. LCSW validated Tami Skinner's feelings and encouraged her to communicate with her husband about why she's discussing these  things with others, and how he could assist in making change. Tami Skinner expressed understanding and agreement. Tami Skinner reports she has decided she no longer wants to see Dr. Elna Breslow, as "I'm just going to stay on the Xanax. I'm addicted to them and I don't want to go through this, so I'll just stay on them until I die." LCSW validated Tami Skinner's feelings and encouraged her to do research on benzos and the negative impacts they can have, but also noted LCSW will support her decision as well. LCSW provided Tami Skinner more guidance on ways to challenge thoughts and to change thoughts in order to change her feelings and behaviors (CBT), and reminded her to use these skills even when she is not upset to begin training her mind to think that way. Tami Skinner expressed understanding and agreement.   Suicidal/Homicidal: No   Therapist Response: Tami Skinner continues to work towards her tx goals but has not yet reached them. We will continue to work on improving emotional regulation skills and distress tolerance skills moving forward.   Plan: Return again in 3 weeks.  Diagnosis: Axis I: Generalized Anxiety Disorder    Axis II: No diagnosis    Tami Dach, LCSW 12/16/2019

## 2019-12-17 ENCOUNTER — Other Ambulatory Visit: Payer: Self-pay | Admitting: Internal Medicine

## 2019-12-18 ENCOUNTER — Other Ambulatory Visit: Payer: Self-pay | Admitting: Internal Medicine

## 2019-12-19 NOTE — Telephone Encounter (Signed)
Xanax last filled 11/21/2019... note to pharmacy [facility] on or after 12/21/2019

## 2019-12-19 NOTE — Telephone Encounter (Signed)
There was a noted from 10/2019 for pt to stop taking at discharge, please advise

## 2019-12-20 ENCOUNTER — Other Ambulatory Visit: Payer: Self-pay | Admitting: Internal Medicine

## 2019-12-21 ENCOUNTER — Ambulatory Visit: Payer: Medicare PPO | Admitting: Psychiatry

## 2019-12-26 ENCOUNTER — Ambulatory Visit: Payer: Self-pay

## 2019-12-30 ENCOUNTER — Ambulatory Visit: Payer: Medicare PPO | Admitting: Pulmonary Disease

## 2020-01-02 ENCOUNTER — Other Ambulatory Visit: Payer: Self-pay

## 2020-01-02 ENCOUNTER — Ambulatory Visit (INDEPENDENT_AMBULATORY_CARE_PROVIDER_SITE_OTHER): Payer: Medicare PPO | Admitting: Licensed Clinical Social Worker

## 2020-01-02 ENCOUNTER — Encounter: Payer: Self-pay | Admitting: Licensed Clinical Social Worker

## 2020-01-02 DIAGNOSIS — F411 Generalized anxiety disorder: Secondary | ICD-10-CM

## 2020-01-02 NOTE — Progress Notes (Signed)
Virtual Visit via Video Note  I connected with [name] on 01/02/20 at  2:30 PM EST by a video enabled telemedicine application and verified that I am speaking with the correct person using two identifiers.   I discussed the limitations of evaluation and management by telemedicine and the availability of in person appointments. The patient expressed understanding and agreed to proceed.   I discussed the assessment and treatment plan with the patient. The patient was provided an opportunity to ask questions and all were answered. The patient agreed with the plan and demonstrated an understanding of the instructions.   The patient was advised to call back or seek an in-person evaluation if the symptoms worsen or if the condition fails to improve as anticipated.  I provided 45 minutes of non-face-to-face time during this encounter.   Heidi Dach, LCSW    THERAPIST PROGRESS NOTE  Session Time: 1430  Participation Level: Active  Behavioral Response: NeatAlertAnxious  Type of Therapy: Individual Therapy  Treatment Goals addressed: Coping  Interventions: Supportive  Summary: ATHELENE HURSEY is a 74 y.o. female who presents with continued symptoms related to her diagnosis. Nyema reports she was currently having a panic attack while on the phone, and asked LCSW to talk to her while she worked through it. LCSW asked Ryleah to name things she saw around the house, things she could hear, and things she could smell. Analya was able to articulate these things, and then asked if LCSW could talk to her more as she was having a difficult time speaking. LCSW was able to do that for [facility] until she was able to stay calm in the moment. LCSW assisted Ayeshia in coming up with a game plan for when she feels her anxiety starting and she is going into a panic attack. LCSW asked Yehudit to list the things that help her most during a panic attack. Gertha reported the following: 1. Xanax, 2. Talking to someone. Emonee was  unable to come up with other things that help during panic attacks. LCSW validated Berna's feelings and encouraged her to think of a step three, which we were able tyo come up with together. LCSW asked Shana to watch TV but to practice mindfulness while doing so--what color shirt does the person have on, what color hair, what are they doing, what time of day is it on the television show, etc. Avrielle expressed understanding and agreement. Chrishana also noted she felt her Xanax needed to be increased. LCSW explained she was not able to do that, and directed her to the person who prescribed the medication. Rajanee expressed understanding and agreement.   Suicidal/Homicidal: No  Therapist Response: Andreia continues to work towards her tx goals but has not yet reached them. We will continue to work on improving emotional regulation skills and distress tolerance skills moving forward.   Plan: Return again in 2 weeks.  Diagnosis: Axis I: Panic Disorder    Axis II: No diagnosis    Heidi Dach, LCSW 01/02/2020

## 2020-01-03 ENCOUNTER — Other Ambulatory Visit: Payer: Self-pay | Admitting: Internal Medicine

## 2020-01-03 DIAGNOSIS — R531 Weakness: Secondary | ICD-10-CM

## 2020-01-05 ENCOUNTER — Telehealth: Payer: Self-pay

## 2020-01-05 NOTE — Telephone Encounter (Signed)
[facility] Day - Client TELEPHONE ADVICE RECORD [facility] Patient Name: Tami Skinner Gender: Female DOB: [birth year] Age: 74 Y 3 M 27 D Return Phone Number: [phone] (Primary), [phone] (Secondary) Address: [place] [address] Client [facility] Day - Client Client Site  [facility] [place] - Day Physician [name] - NP Contact Type Call Who Is Calling Patient / Member / Family / Caregiver Call Type Triage / Clinical Relationship To Patient Self Return Phone Number [phone] (Secondary) Chief Complaint BREATHING - shortness of breath or sounds breathless Reason for Call Symptomatic / Request for Health Information Initial Comment Caller is congested and having trouble breathing Translation No Nurse Assessment Nurse: Fransisco Hertz, RN, Elnita Maxwell Date/Time (Eastern Time): 01/05/2020 12:45:34 PM Confirm and document reason for call. If symptomatic, describe symptoms. ---Caller states she is congested and having trouble breathing. She has COPD but she is stopped up in her nose and her Flonase is not helping. No fever. She had home health nurse come yesterday and has PT, OT. Has the patient had close contact with a person known or suspected to have the novel coronavirus illness OR traveled / lives in area with major community spread (including international travel) in the last 14 days from the onset of symptoms? * If Asymptomatic, screen for exposure and travel within the last 14 days. ---No Does the patient have any new or worsening symptoms? ---Yes Will a triage be completed? ---Yes Related visit to physician within the last 2 weeks? ---No Does the PT have any chronic conditions? (i.e. diabetes, asthma, this includes High risk factors for pregnancy, etc.) ---Yes List chronic conditions. ---COPD, diabetes, hypertension Is this a behavioral health or substance abuse call?  ---No Guidelines Guideline Title Affirmed Question Affirmed Notes Nurse Date/Time Lamount Cohen Time) COPD Oxygen Monitoring and Hypoxia Nurse judgment Fransisco Hertz, RN, Elnita Maxwell 01/05/2020 12:51:54 PM Disp. Time Lamount Cohen Time) Disposition Final User 01/05/2020 12:41:01 PM Send to Urgent Queue Leotis Shames PLEASE NOTE: All timestamps contained within this report are represented as [place] Standard Time. CONFIDENTIALTY NOTICE: This fax transmission is intended only for the addressee. It contains information that is legally privileged, confidential or otherwise protected from use or disclosure. If you are not the intended recipient, you are strictly prohibited from reviewing, disclosing, copying using or disseminating any of this information or taking any action in reliance on or regarding this information. If you have received this fax in error, please notify us immediately by telephone so that we can arrange for its return to [place]. Phone: [phone], Toll-Free: [phone], Fax: [phone] Page: 2 of 2 Call Id: [id number] Disp. Time Lamount Cohen Time) Disposition Final User 01/05/2020 12:59:50 PM Attempt made - message left Wendie Chess 01/05/2020 12:59:15 PM Urgent Home Treatment with Follow-Up Call Yes Fransisco Hertz, RN, Elnita Maxwell Caller Disagree/Comply Comply Caller Understands Yes PreDisposition Did not know what to do Care Advice Given Per Guideline URGENT HOME TREATMENT WITH FOLLOW-UP CALL: * CALL CENTER PROVIDES RN CALL-BACKS: You should usually improve with the home treatment advice I give you. I'll call you back in 30-60 minutes to see how you are doing. Call me back immediately if: you become worse before my follow-up call. NOTE TO TRIAGER - PATIENT INTERVENTIONS. The nurse triager may want to recommend one or more of the following interventions to the patient or caregiver: * Give beta-agonists nebulizer (e.g., Proventil, Alupent, Albuterol, Ventolin, Salbutamol) per standing order or current  prescription. * Repeat pulse oximetry if abnormal. Check probe placement. CALL BACK IF: *  You become worse. Comments User: Margretta Ditty, RN Date/Time Eilene Ghazi Time): 01/05/2020 12:52:08 PM PO2 90-91% User: Margretta Ditty, RN Date/Time (Eastern Time): 01/05/2020 12:52:24 PM P 76. User: Margretta Ditty, RN Date/Time Eilene Ghazi Time): 01/05/2020 12:53:30 PM On O2 2-3LPM at all times User: Margretta Ditty, RN Date/Time Eilene Ghazi Time): 01/05/2020 1:01:45 PM Caller sats 90-92 on 2LPM O2 per [place] and speaking in phrases. Has COPD and nasal congestion. States she thinks it is due to congestion but her nose is running and not stopped up. No fever. Will do Neb tx and I will call back in 1 hr. User: Margretta Ditty, RN Date/Time Eilene Ghazi Time): 01/05/2020 2:00:51 PM Follow up call PO2 95% and speech is now clear. No SOB now.

## 2020-01-05 NOTE — Telephone Encounter (Signed)
noted 

## 2020-01-05 NOTE — Telephone Encounter (Signed)
Per FU call Elnita Maxwell RN listed PO2 95%, speech is clear now and no SOB.Per pt appt notes pt has appt with [facility] on 01/11/20. FYI to Pamala Hurry NP.

## 2020-01-09 ENCOUNTER — Telehealth: Payer: Self-pay

## 2020-01-09 NOTE — Telephone Encounter (Signed)
Pt reports having an anxiety attack this weekend due to fear of end stage COPD. Pt also reports nausea and abdominal pain is ongoing and that her PCP is aware of this. Pt requested apt to discuss anxiety. Scheduled pt for apt. Pt verbalized understanding.

## 2020-01-09 NOTE — Telephone Encounter (Signed)
[facility] Night - Client TELEPHONE ADVICE RECORD AccessNurse Patient Name: Tami Skinner Gender: Female DOB: 1946/05/22 Age: 74 Y 4 M 2 D Return Phone Number: [phone] (Primary), [phone] (Secondary) Address: [place] [address] Client [facility] Night - Client Client Site [facility] - Night Physician [name] - NP Contact Type Call Who Is Calling Patient / Member / Family / Caregiver Call Type Triage / Clinical Relationship To Patient Self Return Phone Number [phone] (Primary) Chief Complaint ABDOMINAL PAIN - Severe and only in abdomen Reason for Call Symptomatic / Request for Health Information Initial Comment Caller states she is having some pain in her upper stomach and severe nausea. No other symptoms. Translation No Nurse Assessment Nurse: Araceli Bouche, RN, Kitty Date/Time (Eastern Time): 01/07/2020 6:47:40 PM Confirm and document reason for call. If symptomatic, describe symptoms. ---Caller states that she is experiencing nausea and epigastric pain. Afebrile. No vomiting. Has the patient had close contact with a person known or suspected to have the novel coronavirus illness OR traveled / lives in area with major community spread (including international travel) in the last 14 days from the onset of symptoms? * If Asymptomatic, screen for exposure and travel within the last 14 days. ---No Does the patient have any new or worsening symptoms? ---Yes Will a triage be completed? ---Yes Related visit to physician within the last 2 weeks? ---No Does the PT have any chronic conditions? (i.e. diabetes, asthma, this includes High risk factors for pregnancy, etc.) ---Yes List chronic conditions. ---COPD, anxiety, stomach issues but cannot have an EGD due to her COPD Is this a behavioral health or substance abuse call? ---No Guidelines Guideline Title Affirmed Question Affirmed  Notes Nurse Date/Time (Eastern Time) Anxiety and Panic Attack [1] Symptoms of anxiety or panic attack AND [2] is a chronic symptom (recurrent or ongoing AND present > 4 weeks) Araceli Bouche, RN, [facility] 01/07/2020 6:59:35 PM PLEASE NOTE: All timestamps contained within this report are represented as [place] Standard Time. CONFIDENTIALTY NOTICE: This fax transmission is intended only for the addressee. It contains information that is legally privileged, confidential or otherwise protected from use or disclosure. If you are not the intended recipient, you are strictly prohibited from reviewing, disclosing, copying using or disseminating any of this information or taking any action in reliance on or regarding this information. If you have received this fax in error, please notify us immediately by telephone so that we can arrange for its return to [place]. Phone: [phone], Toll-Free: [phone], Fax: [phone] Page: 2 of 2 Call Id: [id number] Disp. Time Lamount Cohen Time) Disposition Final User 01/07/2020 7:06:59 PM See PCP within 2 Weeks Yes Araceli Bouche, RN, Georgeann Oppenheim Caller Disagree/Comply Comply Caller Understands Yes PreDisposition Call Doctor Care Advice Given Per Guideline SEE PCP WITHIN 2 WEEKS: REASSURANCE AND EDUCATION - ANXIETY: * Anxiety is a normal reaction to a stressful situation. CALL BACK IF: * You become worse. Comments User: Buford Dresser, RN Date/Time Lamount Cohen Time): 01/07/2020 6:55:34 PM Pulse ox 87% sat and heart rate 62 bpm. She is on home oxygen. User: Buford Dresser, RN Date/Time Lamount Cohen Time): 01/07/2020 6:58:18 PM New oxygen saturation is 91%. She is very anxious. Pursed lip breathing. 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 Caller is on xanax and buspar for her anxiety, she took them at 1830. Referrals REFERRED TO PCP  OFFICE

## 2020-01-09 NOTE — Telephone Encounter (Signed)
Will discuss at upcoming appt.

## 2020-01-10 ENCOUNTER — Encounter: Payer: Self-pay | Admitting: Internal Medicine

## 2020-01-10 ENCOUNTER — Other Ambulatory Visit: Payer: Self-pay

## 2020-01-10 ENCOUNTER — Ambulatory Visit: Payer: Medicare PPO | Admitting: Internal Medicine

## 2020-01-10 VITALS — BP 116/78 | HR 88 | Temp 96.8°F | Wt 152.0 lb

## 2020-01-10 DIAGNOSIS — E119 Type 2 diabetes mellitus without complications: Secondary | ICD-10-CM

## 2020-01-10 DIAGNOSIS — F411 Generalized anxiety disorder: Secondary | ICD-10-CM

## 2020-01-10 DIAGNOSIS — F330 Major depressive disorder, recurrent, mild: Secondary | ICD-10-CM | POA: Diagnosis not present

## 2020-01-10 DIAGNOSIS — F1320 Sedative, hypnotic or anxiolytic dependence, uncomplicated: Secondary | ICD-10-CM

## 2020-01-10 DIAGNOSIS — F410 Panic disorder [episodic paroxysmal anxiety] without agoraphobia: Secondary | ICD-10-CM | POA: Diagnosis not present

## 2020-01-10 DIAGNOSIS — J449 Chronic obstructive pulmonary disease, unspecified: Secondary | ICD-10-CM

## 2020-01-10 LAB — POCT GLYCOSYLATED HEMOGLOBIN (HGB A1C): Hemoglobin A1C: 5.6 % (ref 4.0–5.6)

## 2020-01-10 MED ORDER — QUETIAPINE FUMARATE 50 MG PO TABS: 50.0000 mg | ORAL_TABLET | Freq: Every day | ORAL | 1 refills | Status: AC

## 2020-01-10 MED ORDER — ONDANSETRON HCL 4 MG PO TABS: 4.0000 mg | ORAL_TABLET | ORAL | 2 refills | Status: AC | PRN

## 2020-01-10 MED ORDER — ALPRAZOLAM 1 MG PO TABS: 1.0000 mg | ORAL_TABLET | Freq: Three times a day (TID) | ORAL | 0 refills | Status: DC

## 2020-01-10 NOTE — Progress Notes (Signed)
Subjective:    Patient ID: Tami Skinner, female    DOB: 11/26/1945, 74 y.o.   MRN: [id number]  HPI  Pt presents to the clinic today with c/o anxiety. She is currently managed on Sertraline, Buspar and Xanax. She had seen [facility] in the past. They had wanted her to stop the Sertraline and transition to Escitalopram. She picked up the Escitalopram but never started it. [facility] also wanted her to take less Xanax, so they gave her a RX for Hydroxyzine which she reports is ineffective. She is seeing a therapist. Her anxiety is multifactorial but also triggered by her breathing issues. She is in end stage COPD.  She would also liked to have her A1C checked. She is not taking any oral diabetic medication at this time.  Review of Systems  Past Medical History:  Diagnosis Date  . Anxiety   . Asthmatic bronchitis   . COPD (chronic obstructive pulmonary disease) ([place])   . Depression   . Diabetes mellitus   . Diverticulitis   . Diverticulosis   . Hyperlipidemia   . Hypertension   . Osteopenia   . Sleep apnea   . Stress-induced cardiomyopathy    a. echo 09/28/2014: EF 45-50%, severe HK of mid-distal anterior, apical and mid-distal inferior walls suggestive of stress induced CM, mildly dilated LA, mildly dilated PASP, mild TR, trivial pericardial effusion    Current Outpatient Medications  Medication Sig Dispense Refill  . ALPRAZolam (XANAX) 0.5 MG tablet TAKE 1 TABLET (0.5 MG TOTAL) BY MOUTH 3 (THREE) TIMES DAILY AS NEEDED FOR ANXIETY. 90 tablet 0  . buPROPion (WELLBUTRIN XL) 150 MG 24 hr tablet Take 1 tablet (150 mg total) by mouth daily. 90 tablet 1  . ondansetron (ZOFRAN-ODT) 8 MG disintegrating tablet TAKE 1 TABLET (8 MG TOTAL) BY MOUTH EVERY 8 (EIGHT) HOURS AS NEEDED FOR NAUSEA OR VOMITING. 20 tablet 0  . albuterol (PROVENTIL) (2.5 MG/3ML) 0.083% nebulizer solution TAKE 3 MLS (2.5 MG TOTAL) BY NEBULIZATION EVERY 6 (SIX) HOURS AS NEEDED FOR WHEEZING OR SHORTNESS OF BREATH. (Patient taking  differently: every 6 (six) hours as needed. ) 75 mL 12  . albuterol (VENTOLIN HFA) 108 (90 Base) MCG/ACT inhaler INHALE 2 PUFFS INTO THE LUNGS EVERY 4 (FOUR) HOURS AS NEEDED FOR WHEEZING OR SHORTNESS OF BREATH. 18 g 6  . Budeson-Glycopyrrol-Formoterol (BREZTRI AEROSPHERE) 160-9-4.8 MCG/ACT AERO Inhale 2 puffs into the lungs 2 (two) times daily. 10.7 g 11  . budesonide (PULMICORT) 0.25 MG/2ML nebulizer solution Take 2 mLs by nebulization 2 (two) times daily.    . busPIRone (BUSPAR) 10 MG tablet Take 1 tablet (10 mg total) by mouth 3 (three) times daily. 270 tablet 0  . conjugated estrogens (PREMARIN) vaginal cream Apply 0.5g (pea-sized amount) just inside the vaginal introitus with a finger-tip on Monday,Wednesday and Friday night 30 g 12  . doxycycline (VIBRA-TABS) 100 MG tablet Take 1 tablet (100 mg total) by mouth 2 (two) times daily. 20 tablet 0  . escitalopram (LEXAPRO) 10 MG tablet TAKE 1 TABLET (10 MG TOTAL) BY MOUTH DAILY WITH SUPPER. 90 tablet 1  . feeding supplement, ENSURE ENLIVE, (ENSURE ENLIVE) LIQD Take 237 mLs by mouth 2 (two) times daily between meals. 237 mL 12  . fluticasone (FLONASE) 50 MCG/ACT nasal spray Place 2 sprays into both nostrils daily. 16 g 6  . furosemide (LASIX) 20 MG tablet Take 1 tablet (20 mg total) by mouth as needed for edema. 30 tablet 5  . guaiFENesin (MUCINEX) 600 MG 12  hr tablet Take 1 tablet (600 mg total) by mouth 2 (two) times daily. 30 tablet 0  . hydrOXYzine (ATARAX/VISTARIL) 25 MG tablet TAKE 0.5-1 TAB BY MOUTH 2 TIMES DAILY AS NEEDED. ONLY FOR SEVERE BREAKTHROUGH ANXIETY ATTACKS 180 tablet 1  . metoprolol succinate (TOPROL-XL) 50 MG 24 hr tablet Take 1 tablet (50 mg total) by mouth daily. Take with or immediately following a meal. 90 tablet 3  . nystatin (MYCOSTATIN/NYSTOP) powder Apply topically 3 (three) times daily. 60 g 5  . omeprazole (PRILOSEC) 40 MG capsule TAKE 1 CAPSULE BY MOUTH EVERY DAY 90 capsule 1  . ondansetron (ZOFRAN) 4 MG tablet Take 4  mg by mouth as needed.     . ONE TOUCH ULTRA TEST test strip 1 EACH BY OTHER ROUTE DAILY AS NEEDED FOR OTHER. 25 each 5  . oxybutynin (DITROPAN-XL) 5 MG 24 hr tablet TAKE 1 TABLET BY MOUTH EVERYDAY AT BEDTIME (Patient not taking: No sig reported) 90 tablet 0  . QUEtiapine (SEROQUEL) 50 MG tablet TAKE 1 TABLET BY MOUTH EVERYDAY AT BEDTIME 90 tablet 1  . sertraline (ZOLOFT) 100 MG tablet Take 100 mg by mouth 2 (two) times daily.    . sertraline (ZOLOFT) 25 MG tablet Take 1 tablet (25 mg total) by mouth daily with breakfast. Take for 10 days and stop (Patient not taking: Reported on 11/23/2019) 10 tablet 0  . SPIRIVA HANDIHALER 18 MCG inhalation capsule INHALE 1 CAPSULE VIA HANDIHALER ONCE DAILY AT THE SAME TIME EVERY DAY 30 capsule 4   No current facility-administered medications for this visit.    Allergies  Allergen Reactions  . Amoxicillin-Pot Clavulanate     REACTION: stomach sensitive.  Lenice Llamas [Roflumilast]     Severe nausea   . Erythromycin Base     REACTION: GI upset (cramping and diarrhea)  . Neurontin [Gabapentin]     anxiety    Family History  Problem Relation Age of Onset  . Diabetes Daughter   . Kidney disease Daughter   . Anxiety disorder Daughter   . Drug abuse Daughter   . Lung cancer Mother        smoked  . COPD Mother        smoked  . COPD Father        smoked  . Prostate cancer Father   . Kidney failure Father   . Breast cancer Maternal Aunt   . Diabetes Maternal Grandmother   . Breast cancer Maternal Grandmother   . Cancer Cousin        colon    Social History   Socioeconomic History  . Marital status: Married    Spouse name: Not on file  . Number of children: Not on file  . Years of education: Not on file  . Highest education level: Not on file  Occupational History  . Not on file  Tobacco Use  . Smoking status: Former Smoker    Packs/day: 0.25    Years: 40.00    Pack years: 10.00    Types: Cigarettes    Quit date: 09/01/2016    Years  since quitting: 3.3  . Smokeless tobacco: Never Used  . Tobacco comment: Uses nicotrol inhaler on occasion  Substance and Sexual Activity  . Alcohol use: No  . Drug use: No  . Sexual activity: Not Currently  Other Topics Concern  . Not on file  Social History Narrative   Lives with husband in [place]. 2 dogs.   Social Determinants of Health  Financial Resource Strain:   . Difficulty of Paying Living Expenses: Not on file  Food Insecurity:   . Worried About Programme researcher, broadcasting/film/video in the Last Year: Not on file  . Ran Out of Food in the Last Year: Not on file  Transportation Needs:   . Lack of Transportation (Medical): Not on file  . Lack of Transportation (Non-Medical): Not on file  Physical Activity:   . Days of Exercise per Week: Not on file  . Minutes of Exercise per Session: Not on file  Stress:   . Feeling of Stress : Not on file  Social Connections:   . Frequency of Communication with Friends and Family: Not on file  . Frequency of Social Gatherings with Friends and Family: Not on file  . Attends Religious Services: Not on file  . Active Member of Clubs or Organizations: Not on file  . Attends Banker Meetings: Not on file  . Marital Status: Not on file  Intimate Partner Violence:   . Fear of Current or Ex-Partner: Not on file  . Emotionally Abused: Not on file  . Physically Abused: Not on file  . Sexually Abused: Not on file     Constitutional: Denies fever, malaise, fatigue, headache or abrupt weight changes.  Respiratory: Pt reports cough, SOB. Denies difficulty breathing, or sputum production.   Cardiovascular: Denies chest pain, chest tightness, palpitations or swelling in the hands or feet.  Psych: Pt reports anxiety, depression at times. Denies SI/HI.  No other specific complaints in a complete review of systems (except as listed in HPI above).     Objective:   Physical Exam  BP 116/78   Pulse 88   Temp (!) 96.8 F (36 C) (Temporal)   Wt  152 lb (68.9 kg)   SpO2 98%   BMI 26.93 kg/m   Wt Readings from Last 3 Encounters:  11/23/19 156 lb (70.8 kg)  11/02/19 148 lb (67.1 kg)  10/26/19 140 lb (63.5 kg)    General: Appears her stated age, chronically ill appearing, in NAD. Neurological: Alert and oriented.  Psychiatric: Anxious appearing.  BMET    Component Value Date/Time   NA 142 10/28/2019 0201   NA 140 09/28/2014 0637   K 3.4 (L) 10/28/2019 0201   K 4.3 09/28/2014 0637   CL 90 (L) 10/28/2019 0201   CL 108 (H) 09/28/2014 0637   CO2 37 (H) 10/28/2019 0201   CO2 26 09/28/2014 0637   GLUCOSE 166 (H) 10/28/2019 0201   GLUCOSE 117 (H) 09/28/2014 0637   BUN 22 10/28/2019 0201   BUN 13 09/28/2014 0637   CREATININE 0.87 10/28/2019 0201   CREATININE 1.17 (H) 12/31/2018 1625   CALCIUM 10.0 10/28/2019 0201   CALCIUM 7.7 (L) 09/28/2014 0637   GFRNONAA >60 10/28/2019 0201   GFRNONAA >60 09/28/2014 0637   GFRAA >60 10/28/2019 0201   GFRAA >60 09/28/2014 0637    Lipid Panel     Component Value Date/Time   CHOL 162 12/31/2018 1625   CHOL 110 09/28/2014 0637   TRIG 94 12/31/2018 1625   TRIG 102 09/28/2014 0637   HDL 77 12/31/2018 1625   HDL 54 09/28/2014 0637   CHOLHDL 2.1 12/31/2018 1625   VLDL 11.8 01/20/2017 1407   VLDL 20 09/28/2014 0637   LDLCALC 67 12/31/2018 1625   LDLCALC 36 09/28/2014 0637    CBC    Component Value Date/Time   WBC 7.8 10/28/2019 0201   RBC 4.09 10/28/2019 0201  HGB 11.6 (L) 10/28/2019 0201   HGB 13.0 09/30/2014 0525   HCT 36.8 10/28/2019 0201   HCT 39.0 09/30/2014 0525   PLT 264 10/28/2019 0201   PLT 234 09/30/2014 0525   MCV 90.0 10/28/2019 0201   MCV 93 09/30/2014 0525   MCH 28.4 10/28/2019 0201   MCHC 31.5 10/28/2019 0201   RDW 13.4 10/28/2019 0201   RDW 14.1 09/30/2014 0525   LYMPHSABS 0.9 10/26/2019 1345   LYMPHSABS 1.3 09/30/2014 0525   MONOABS 0.5 10/26/2019 1345   MONOABS 0.5 09/30/2014 0525   EOSABS 0.3 10/26/2019 1345   EOSABS 0.2 09/30/2014 0525    BASOSABS 0.0 10/26/2019 1345   BASOSABS 0.0 09/30/2014 0525    Hgb A1C Lab Results  Component Value Date   HGBA1C 5.7 (H) 09/22/2019          Assessment & Plan:

## 2020-01-11 ENCOUNTER — Encounter: Payer: Self-pay | Admitting: Internal Medicine

## 2020-01-11 ENCOUNTER — Encounter: Payer: Self-pay | Admitting: Pulmonary Disease

## 2020-01-11 ENCOUNTER — Ambulatory Visit: Payer: Medicare PPO | Admitting: Pulmonary Disease

## 2020-01-11 VITALS — BP 120/70 | HR 65 | Temp 97.7°F | Ht 63.0 in | Wt 152.0 lb

## 2020-01-11 DIAGNOSIS — J9611 Chronic respiratory failure with hypoxia: Secondary | ICD-10-CM | POA: Diagnosis not present

## 2020-01-11 DIAGNOSIS — J449 Chronic obstructive pulmonary disease, unspecified: Secondary | ICD-10-CM | POA: Diagnosis not present

## 2020-01-11 DIAGNOSIS — F411 Generalized anxiety disorder: Secondary | ICD-10-CM

## 2020-01-11 DIAGNOSIS — Z66 Do not resuscitate: Secondary | ICD-10-CM

## 2020-01-11 DIAGNOSIS — G4733 Obstructive sleep apnea (adult) (pediatric): Secondary | ICD-10-CM

## 2020-01-11 MED ORDER — BUDESONIDE 0.25 MG/2ML IN SUSP: 2.0000 mL | Freq: Two times a day (BID) | RESPIRATORY_TRACT | 11 refills | Status: AC

## 2020-01-11 MED ORDER — IPRATROPIUM-ALBUTEROL 0.5-2.5 (3) MG/3ML IN SOLN: 3.0000 mL | Freq: Four times a day (QID) | RESPIRATORY_TRACT | 12 refills | Status: AC | PRN

## 2020-01-11 NOTE — Patient Instructions (Signed)
We are doing a referral to Palliative Care  Not take Tami Skinner  You will go back on nebulizer medications as follows:  DuoNeb (ipratropium/albuterol) 4 times a day  Budesonide (Pulmicort) 2 times a day AFTER DuoNeb  Albuterol as needed up to 3 times a day   See you in follow-up in 4 to 6 weeks time.  Call sooner should any new difficulties arise.

## 2020-01-11 NOTE — Assessment & Plan Note (Signed)
End stage Continue inhalers and neb treatments Continuous oxygen She will continue to follow with pulmonology

## 2020-01-11 NOTE — Assessment & Plan Note (Signed)
POCT A1C today 

## 2020-01-11 NOTE — Patient Instructions (Signed)

## 2020-01-11 NOTE — Assessment & Plan Note (Signed)
Transition Sertraline to Escitalopram D/C Hydroxyzine Continue Buspar Increase Xanax to 1 mg TID 

## 2020-01-11 NOTE — Assessment & Plan Note (Signed)
Transition Sertraline to Escitalopram D/C Hydroxyzine Continue Buspar Increase Xanax to 1 mg TID

## 2020-01-11 NOTE — Progress Notes (Signed)
Subjective:    Patient ID: Tami Skinner, female    DOB: 1946-05-21, 74 y.o.   MRN: [id number] PT PROFILE: 52 F former smoker (quit October 2017) previously followed by Dr [place] Bells for lung nodule and moderate COPD.  Subsequently followed by Dr. Alva Garnet and maintained on Spiriva, [place], PRN albuterol with class III to IV dyspnea  PROBLEMS: Chronic hypoxemic and hypercarbic respiratory failure Very severe COPD Obstructive sleep apnea, CPAP intolerant  DATA: Spirometry 02/03/12: Moderate obstruction (FEV1 1.21 liters, 49% pred) CT chest 09/27/14:Stable right lower lobe pulmonary nodule over over 2 year interval consistent benign etiology PFTs 11/11/16:mod-severe obstruction (FEV1 0.95 liters, 44% pred), mild restriction, severe reduction in DLCO. Lung volumes are probably underestimated by inadequate test performance CXR 11/28/16:hyperinflation. NACPD CXR 09/22/2019: Cardiomegaly, hyperinflation, no acute disease Venous Dopplers bilateral 09/23/2019: Negative for DVT CXR 10/26/2019: Stable chronic upper lobe interstitial markings, hyperinflation, cardiomegaly  HPI Tami Skinner is a 74 year old former smoker with SEVERE COPD class IV and class III-IV dyspnea, she has chronic hypoxic-hypercarbic respiratory failure and presents for follow-up.  Please refer to previous notes the patient has had 2 exacerbations since November 2020 that required hospitalization.  After her last exacerbation she was referred to hospice however she declined the services.  She continues to complain bitterly of an anxiety and shortness of breath.  On a prior by phone visit we started her on Breztri 2 puffs twice a day.  She does not note any improvement with this.  She seems somewhat befuddled with her respiratory medications.  She is on anxiolytics and antidepressants but continues to be severely depressed and notes that despite this she is not suicidal.  Apparently her grandson was concerned about this but she  states this that this is not the case.  We discussed that management of her issues is quite limited and should be geared more towards palliation given that she has end-stage disease.  She does admit that inability to feel any relief from inhalers.  I suspect that this is due to inability to breath-hold.  She has not had any fevers, chills or sweats no sputum production or hemoptysis.  The majority of the visit is spent addressing issues with her anxiety which I have recommended that she discuss with her mental health provider and primary care provider.  We did discuss the advantages of having Palliative Care follow her.  She is willing to consider this.  She is DNR/DNI but at present does not want to be "full hospice".  Given that she does need some symptom control and I have recommended that she consider Palliative Care again.  She does not describe any chest pain, orthopnea or paroxysmal nocturnal dyspnea.  No lower extremity edema.   Review of Systems A 10 point review of systems was performed and it is as noted above otherwise negative.    Objective:   Physical Exam BP 120/70 (BP Location: Left Arm, Cuff Size: Normal)   Pulse 65   Temp 97.7 F (36.5 C) (Temporal)   Ht 5\' 3"  (1.6 m)   Wt 152 lb (68.9 kg)   SpO2 100% Comment: 3L  BMI 26.93 kg/m  GENERAL: Anxious woman, chronic use of accessories, in wheelchair.  She is wearing oxygen via nasal cannula.  Currently at 3 L, decreased to 2 L during the visit. HEAD: Normocephalic, atraumatic.  EYES: Pupils equal, round, reactive to light.  No scleral icterus.  MOUTH: Nose/mouth/throat not examined due to masking requirements for COVID 19. NECK: Supple. No thyromegaly.  No nodules. No JVD.  Trachea midline PULMONARY: Increased AP diameter, positive Hoover sign, very distant breath sounds, coarse, no other adventitious sounds. CARDIOVASCULAR: S1 and S2. Regular rate and rhythm.  Distant tones due to hyperinflation.  Cannot appreciate  murmurs. GASTROINTESTINAL: Nondistended abdomen, soft. MUSCULOSKELETAL: No joint deformity, no clubbing, no edema.  NEUROLOGIC: No focal deficits noted.  Fluent speech.  No asterixis. SKIN: Intact,warm,dry.  Limited examination shows no rashes or lesions. PSYCH: Very anxious, labile mood.      Assessment & Plan:   Stage IV very severe COPD Class III-IV dyspnea Chronic hypoxic/hypercarbic respiratory failure Patient is not getting relief from MDIs and failed DPIs previously Recommend to switch to nebulization therapy DuoNeb 4 times a day via nebulizer Pulmicort 0.25 mg twice a day via nebulizer Albuterol via nebulizer as needed Continue oxygen supplementation decreased her liter flow to 2 L/min Goal of oxygen saturations 88 to 92% (she is a retainer) We will see her in follow-up in 4 to 6 weeks time she is to contact us prior to that time should any new difficulties arise  Generalized anxiety disorder This issue adds complexity to her management Will add to her sensation of dyspnea Defer management to her mental health provider and/or primary provider  End-of-life discussion DNR status I have recommended that she connect with Palliative Care again Consideration for transition to hospice Patient currently not ready for hospice but willing to entertain Palliative Care Consult sent  Obstructive sleep apnea Patient intolerant of noninvasive ventilation   Total visit time 50 minutes.  Gailen Shelter, MD [facility]  *This note was dictated using voice recognition software/Dragon.  Despite best efforts to proofread, errors can occur which can change the meaning.  Any change was purely unintentional.

## 2020-01-12 ENCOUNTER — Encounter: Payer: Self-pay | Admitting: Pulmonary Disease

## 2020-01-15 ENCOUNTER — Encounter: Payer: Self-pay | Admitting: Internal Medicine

## 2020-01-16 ENCOUNTER — Telehealth: Payer: Self-pay

## 2020-01-16 NOTE — Telephone Encounter (Signed)
Why she is referring to is a link IF there is additional instructions or videos.  There were no additional instructions or videos.

## 2020-01-16 NOTE — Telephone Encounter (Signed)
[facility] Night - Client TELEPHONE ADVICE RECORD AccessNurse Patient Name: Tami Skinner Gender: Female DOB: [birth year] Age: 74 Y 4 M 8 D Return Phone Number: [phone] (Primary), [phone] (Secondary) Address: [place] [address] Client [facility] Night - Client Client Site [facility] - Night Physician [name] - NP Contact Type Call Who Is Calling Patient / Member / Family / Caregiver Call Type Triage / Clinical Relationship To Patient Self Return Phone Number [phone] (Primary) Chief Complaint Medication Question (non symptomatic) Reason for Call Symptomatic / Request for Health Information Initial Comment Caller states she have some questions about some medication Translation No No Triage Reason Other Nurse Assessment Nurse: Harland Dingwall, RN, [place] Date/Time Lamount Cohen Time): 01/13/2020 8:45:48 PM Confirm and document reason for call. If symptomatic, describe symptoms. ---Caller only wanted referral to proceed with palliative care. Office number and Saturday clinic number given to pt. Did not need triage. Has the patient had close contact with a person known or suspected to have the novel coronavirus illness OR traveled / lives in area with major community spread (including international travel) in the last 14 days from the onset of symptoms? * If Asymptomatic, screen for exposure and travel within the last 14 days. ---No Does the patient have any new or worsening symptoms? ---Yes Will a triage be completed? ---No Select reason for no triage. ---Other Guidelines Guideline Title Affirmed Question Affirmed Notes Nurse Date/Time (Eastern Time) Disp. Time Lamount Cohen Time) Disposition Final User 01/13/2020 8:53:32 PM Clinical Call Yes Harland Dingwall, RN, [facility] Referrals [facility]

## 2020-01-16 NOTE — Telephone Encounter (Signed)
Dr. Gonzalez please advise. Thanks 

## 2020-01-16 NOTE — Telephone Encounter (Signed)
There is no documentation that pt reached Saturday clinic. Pt was given number to [facility] per triage note, but Saturday clinic was not at [facility] this past Saturday. It was at [place].  LVM for pt to call to see if anything further was needed.

## 2020-01-17 ENCOUNTER — Telehealth: Payer: Self-pay

## 2020-01-17 ENCOUNTER — Other Ambulatory Visit: Payer: Self-pay | Admitting: Internal Medicine

## 2020-01-17 ENCOUNTER — Other Ambulatory Visit: Payer: Self-pay

## 2020-01-17 ENCOUNTER — Telehealth: Payer: Self-pay | Admitting: Adult Health Nurse Practitioner

## 2020-01-17 ENCOUNTER — Other Ambulatory Visit: Payer: Medicare PPO | Admitting: Adult Health Nurse Practitioner

## 2020-01-17 DIAGNOSIS — J449 Chronic obstructive pulmonary disease, unspecified: Secondary | ICD-10-CM

## 2020-01-17 DIAGNOSIS — Z515 Encounter for palliative care: Secondary | ICD-10-CM

## 2020-01-17 NOTE — Telephone Encounter (Signed)
Spoke with patient and have scheduled an In-person Palliative f/u visit for 01/17/20 @ 1 PM. °

## 2020-01-17 NOTE — Telephone Encounter (Signed)
It appears pt has been scheduled for palliative f/u visit.

## 2020-01-17 NOTE — Telephone Encounter (Signed)
At the direction of Amy NP, message sent to PCP to provide update from Palliative care visit and to request an order for hospice eval.

## 2020-01-17 NOTE — Progress Notes (Signed)
Therapist, nutritional Palliative Care Consult Note Telephone: [phone]  Fax: [phone]  PATIENT NAME: Tami Skinner DOB: 1945-11-22 MRN: [id number]  PRIMARY CARE PROVIDER:   Lorre Munroe, NP  REFERRING PROVIDER:  Lorre Munroe, NP [address] [place],  [place] [address]  RESPONSIBLE PARTY:   Self [phone] [place], [place]  [phone]     RECOMMENDATIONS and PLAN:  1.  Advanced care planning.  Patient is DNR/DNI  2.  COPD stage 4. She is being seen by Dr. Jayme Cloud with pulmonology. Patient was recently switched to nebulizer treatments from inhalers.  Suspect she was not breathing in good enough to get full benefits from the inhalers. She is on duonebs QID, pulmicort (neb) BID, and albuterol (neb) PRN up to TID.  Patient is drowsy today but is following conversation.  States that she did not sleep well last night. She also has obstructive sleep apnea but is not able to tolerate CPAP machine.  Has to sleep with head elevated on arm of couch and 2 pillows.  States that she has been coughing more past couple of days (believe this is due to her not taking her neb txs as she should). She does have edema today (see below). Caregiver states that she gets edema sometimes but it will usually go away when she props her legs.  The edema has been there for a couple of days. Skinner crackles heard in lungs with auscultation.  Denies fever, chest pain, N/V/D today.She has not been taking her neb treatments scheduled but only as needed. Went over with her and caregiver how to be giving the neb treatments and wrote out a schedule for taking them.  2.  Functional status.  Husband states that until about a week ago she was able to get up from couch and use bedside commode.  States that now she requires assistance to stand up.  Caregiver states that she can ambulate with a rollator a few feet but gets extremely SOB.  She mainly sleeps and sits in the living room on the  couch or in a recliner.   She requires assistance with ADLs.  Her appetite is okay and denies any weight loss.  She was evaluated for hospice in December 2020 and declined when hospital bed was brought to the house.  Went over disease progression and hospice services versus palliative services.  Patient is in agreement with hospice services.  Reached out to PCP who is in agreement with hospice and will continue to be attending.  I spent 120 minutes providing this consultation,  from 1:00 to 3:00 including time with patient/family, chart review, provider coordination, and documentation. More than 50% of the time in this consultation was spent coordinating communication.   HISTORY OF PRESENT ILLNESS:  Tami Skinner is a 74 y.o. year old female with multiple medical problems including stage 4 COPD, OSA-CPAP intolerant, HTN, DMT2, anxiety, depression. Palliative Care was asked to help address goals of care.   CODE STATUS: DNR/DNI  PPS: 30% HOSPICE ELIGIBILITY/DIAGNOSIS: yes/Stage 4 COPD  PHYSICAL EXAM:  BP 136/68  HR  66  O2 97% on 2.5L after duoneb tx.  (was 81-83% prior to breathing tx) General: NAD, frail appearing Cardiovascular: regular rate and rhythm Pulmonary: wheezing heard throughout prior to neb tx and decreased wheezing after breathing tx.;  Pt does use pursed lipped breathing even at rest Abdomen: soft, nontender, + bowel sounds GU: Skinner suprapubic tenderness Extremities: trace to 1+ edema noted  to bilateral lower extremities, Skinner joint deformities Skin: Skinner rashes on exposed skin Neurological: Weakness but otherwise nonfocal; patient is drowsy today which caregiver states is not like her   PAST MEDICAL HISTORY:  Past Medical History:  Diagnosis Date  . Anxiety   . Asthmatic bronchitis   . COPD (chronic obstructive pulmonary disease) (HCC)   . Depression   . Diabetes mellitus   . Diverticulitis   . Diverticulosis   . Hyperlipidemia   . Hypertension   . Osteopenia   . Sleep  apnea   . Stress-induced cardiomyopathy    a. echo 09/28/2014: EF 45-50%, severe HK of mid-distal anterior, apical and mid-distal inferior walls suggestive of stress induced CM, mildly dilated LA, mildly dilated PASP, mild TR, trivial pericardial effusion    SOCIAL HX:  Social History   Tobacco Use  . Smoking status: Former Smoker    Packs/day: 0.25    Years: 40.00    Pack years: 10.00    Types: Cigarettes    Quit date: 09/01/2016    Years since quitting: 3.3  . Smokeless tobacco: Never Used  . Tobacco comment: Uses nicotrol inhaler on occasion  Substance Use Topics  . Alcohol use: Skinner    ALLERGIES:  Allergies  Allergen Reactions  . Amoxicillin-Pot Clavulanate     REACTION: stomach sensitive.  Lenice Llamas [Roflumilast]     Severe nausea   . Erythromycin Base     REACTION: GI upset (cramping and diarrhea)  . Neurontin [Gabapentin]     anxiety     PERTINENT MEDICATIONS:  Outpatient Encounter Medications as of 01/17/2020  Medication Sig  . albuterol (PROVENTIL) (2.5 MG/3ML) 0.083% nebulizer solution TAKE 3 MLS (2.5 MG TOTAL) BY NEBULIZATION EVERY 6 (SIX) HOURS AS NEEDED FOR WHEEZING OR SHORTNESS OF BREATH. (Patient taking differently: every 6 (six) hours as needed. )  . albuterol (VENTOLIN HFA) 108 (90 Base) MCG/ACT inhaler INHALE 2 PUFFS INTO THE LUNGS EVERY 4 (FOUR) HOURS AS NEEDED FOR WHEEZING OR SHORTNESS OF BREATH.  Marland Kitchen ALPRAZolam (XANAX) 1 MG tablet Take 1 tablet (1 mg total) by mouth 3 (three) times daily.  . budesonide (PULMICORT) 0.25 MG/2ML nebulizer solution Take 2 mLs (0.25 mg total) by nebulization 2 (two) times daily. Dx:J44.9  . busPIRone (BUSPAR) 10 MG tablet Take 1 tablet (10 mg total) by mouth 3 (three) times daily.  Marland Kitchen conjugated estrogens (PREMARIN) vaginal cream Apply 0.5g (pea-sized amount) just inside the vaginal introitus with a finger-tip on Monday,Wednesday and Friday night  . escitalopram (LEXAPRO) 10 MG tablet TAKE 1 TABLET (10 MG TOTAL) BY MOUTH DAILY  WITH SUPPER.  . feeding supplement, ENSURE ENLIVE, (ENSURE ENLIVE) LIQD Take 237 mLs by mouth 2 (two) times daily between meals.  . fluticasone (FLONASE) 50 MCG/ACT nasal spray Place 2 sprays into both nostrils daily.  Marland Kitchen guaiFENesin (MUCINEX) 600 MG 12 hr tablet Take 1 tablet (600 mg total) by mouth 2 (two) times daily.  Marland Kitchen ipratropium-albuterol (DUONEB) 0.5-2.5 (3) MG/3ML SOLN Take 3 mLs by nebulization every 6 (six) hours as needed. Dx:j44.9  . metoprolol succinate (TOPROL-XL) 50 MG 24 hr tablet Take 1 tablet (50 mg total) by mouth daily. Take with or immediately following a meal.  . nystatin (MYCOSTATIN/NYSTOP) powder Apply topically 3 (three) times daily.  Marland Kitchen omeprazole (PRILOSEC) 40 MG capsule TAKE 1 CAPSULE BY MOUTH EVERY DAY  . ondansetron (ZOFRAN) 4 MG tablet Take 1 tablet (4 mg total) by mouth as needed.  . ONE TOUCH ULTRA TEST test strip 1  EACH BY OTHER ROUTE DAILY AS NEEDED FOR OTHER.  . QUEtiapine (SEROQUEL) 50 MG tablet Take 1 tablet (50 mg total) by mouth at bedtime.   Skinner facility-administered encounter medications on file as of 01/17/2020.     Flay Ghosh Jenetta Downer, NP

## 2020-01-17 NOTE — Telephone Encounter (Signed)
Received response from PCP that she is in agreement with ordering hospice eval and will remain attending. Referral intake dept and Palliative NP made aware

## 2020-01-18 ENCOUNTER — Other Ambulatory Visit: Payer: Medicare PPO | Admitting: Adult Health Nurse Practitioner

## 2020-01-18 ENCOUNTER — Other Ambulatory Visit: Payer: Self-pay | Admitting: Internal Medicine

## 2020-01-18 ENCOUNTER — Ambulatory Visit: Payer: Medicare PPO | Admitting: Licensed Clinical Social Worker

## 2020-01-18 ENCOUNTER — Other Ambulatory Visit: Payer: Self-pay

## 2020-01-18 ENCOUNTER — Telehealth: Payer: Self-pay | Admitting: *Deleted

## 2020-01-18 DIAGNOSIS — Z515 Encounter for palliative care: Secondary | ICD-10-CM

## 2020-01-18 DIAGNOSIS — J449 Chronic obstructive pulmonary disease, unspecified: Secondary | ICD-10-CM

## 2020-01-18 MED ORDER — HYOSCYAMINE SULFATE 0.125 MG PO TBDP: 0.1250 mg | ORAL_TABLET | Freq: Four times a day (QID) | ORAL | 0 refills | Status: AC | PRN

## 2020-01-18 MED ORDER — MORPHINE SULFATE (CONCENTRATE) 20 MG/ML PO SOLN: ORAL | 0 refills | Status: AC

## 2020-01-18 MED ORDER — LORAZEPAM 0.5 MG PO TABS: 0.5000 mg | ORAL_TABLET | ORAL | 0 refills | Status: AC | PRN

## 2020-01-18 NOTE — Telephone Encounter (Signed)
Talked with [name]- DNR signed and placed up front.

## 2020-01-18 NOTE — Progress Notes (Signed)
Therapist, nutritional Palliative Care Consult Note Telephone: [phone]  Fax: [phone]  PATIENT NAME: Tami Skinner DOB: Mar 24, 1946 MRN: [id number]  PRIMARY CARE PROVIDER:   Lorre Munroe, NP  REFERRING PROVIDER:  Lorre Munroe, NP [address] [place],  [place] [address]  RESPONSIBLE PARTY:   Tami Doss Rene Gonsoulin  [phone]   RECOMMENDATIONS and PLAN:  Received message from caregiver that patient was not doing well.  When called back EMS was at the house and wanted provider to come out.   Husband stated that patient was satting at 44% this morning and was not eating and was nauseous.  Had called EMS when unable to get a hold of provider.  EMS had given several albuterol treatments, solu medrol, mag sulfate via IV, and started on nonrebreather mask at 8L of O2.  She was satting at 100% when I arrived.  EMS stated that she was responsive when they first arrived but now she is not. She will open her eyes when repositioned.  12 lead EKG showing several PVCs. See physical assessment below.  Believe she is actively dying and have explained this to the family.   Hospice AV RN had shortly after I arrived.  We removed the IV and left O2 at 8L.  Patient looks comfortable and no signs of pain noted.    I spent 50 minutes providing this consultation,  from 1:45 to 2:35 including time with patient/family, chart review, provider coordination, and documentation. More than 50% of the time in this consultation was spent coordinating communication.   HISTORY OF PRESENT ILLNESS:  Tami Skinner is a 74 y.o. year old female with multiple medical problems including stage 4 COPD, OSA-CPAP intolerant, HTN, DMT2, anxiety, depression. Palliative Care was asked to help address goals of care.   CODE STATUS: DNR  PPS: 10% HOSPICE ELIGIBILITY/DIAGNOSIS: yes/Stage 4 COPD  PHYSICAL EXAM:  HR 80s O2 high 90s to 100 on 8L General:  frail appearing Cardiovascular: regular rate  and rhythm Pulmonary: Patient has non rebreather mask with O2 at 8L; unable to hear lung sounds. Patient is breathing heavy  Extremities: 1+ pitting edema, upper and lower extremities are cold to the touch.  Able to feel pedal and radial pulses but are weak.  No mottling noted. Neurological: patient is nonresponsive but will open eyes when she is being repositioned   PAST MEDICAL HISTORY:  Past Medical History:  Diagnosis Date  . Anxiety   . Asthmatic bronchitis   . COPD (chronic obstructive pulmonary disease) (HCC)   . Depression   . Diabetes mellitus   . Diverticulitis   . Diverticulosis   . Hyperlipidemia   . Hypertension   . Osteopenia   . Sleep apnea   . Stress-induced cardiomyopathy    a. echo 09/28/2014: EF 45-50%, severe HK of mid-distal anterior, apical and mid-distal inferior walls suggestive of stress induced CM, mildly dilated LA, mildly dilated PASP, mild TR, trivial pericardial effusion    SOCIAL HX:  Social History   Tobacco Use  . Smoking status: Former Smoker    Packs/day: 0.25    Years: 40.00    Pack years: 10.00    Types: Cigarettes    Quit date: 09/01/2016    Years since quitting: 3.3  . Smokeless tobacco: Never Used  . Tobacco comment: Uses nicotrol inhaler on occasion  Substance Use Topics  . Alcohol use: No    ALLERGIES:  Allergies  Allergen Reactions  . Amoxicillin-Pot Clavulanate  REACTION: stomach sensitive.  Newton Pigg [Roflumilast]     Severe nausea   . Erythromycin Base     REACTION: GI upset (cramping and diarrhea)  . Neurontin [Gabapentin]     anxiety     PERTINENT MEDICATIONS:  Outpatient Encounter Medications as of 01/18/2020  Medication Sig  . albuterol (PROVENTIL) (2.5 MG/3ML) 0.083% nebulizer solution TAKE 3 MLS (2.5 MG TOTAL) BY NEBULIZATION EVERY 6 (SIX) HOURS AS NEEDED FOR WHEEZING OR SHORTNESS OF BREATH. (Patient taking differently: every 6 (six) hours as needed. )  . albuterol (VENTOLIN HFA) 108 (90 Base) MCG/ACT  inhaler INHALE 2 PUFFS INTO THE LUNGS EVERY 4 (FOUR) HOURS AS NEEDED FOR WHEEZING OR SHORTNESS OF BREATH.  . budesonide (PULMICORT) 0.25 MG/2ML nebulizer solution Take 2 mLs (0.25 mg total) by nebulization 2 (two) times daily. Dx:J44.9  . busPIRone (BUSPAR) 10 MG tablet Take 1 tablet (10 mg total) by mouth 3 (three) times daily.  Marland Kitchen conjugated estrogens (PREMARIN) vaginal cream Apply 0.5g (pea-sized amount) just inside the vaginal introitus with a finger-tip on Monday,Wednesday and Friday night  . escitalopram (LEXAPRO) 10 MG tablet TAKE 1 TABLET (10 MG TOTAL) BY MOUTH DAILY WITH SUPPER.  . feeding supplement, ENSURE ENLIVE, (ENSURE ENLIVE) LIQD Take 237 mLs by mouth 2 (two) times daily between meals.  . fluticasone (FLONASE) 50 MCG/ACT nasal spray Place 2 sprays into both nostrils daily.  Marland Kitchen guaiFENesin (MUCINEX) 600 MG 12 hr tablet Take 1 tablet (600 mg total) by mouth 2 (two) times daily.  . hyoscyamine (ANASPAZ) 0.125 MG TBDP disintergrating tablet Place 1 tablet (0.125 mg total) under the tongue every 6 (six) hours as needed.  Marland Kitchen ipratropium-albuterol (DUONEB) 0.5-2.5 (3) MG/3ML SOLN Take 3 mLs by nebulization every 6 (six) hours as needed. Dx:j44.9  . LORazepam (ATIVAN) 0.5 MG tablet Take 1 tablet (0.5 mg total) by mouth every 4 (four) hours as needed for anxiety.  . metoprolol succinate (TOPROL-XL) 50 MG 24 hr tablet Take 1 tablet (50 mg total) by mouth daily. Take with or immediately following a meal.  . morphine (ROXANOL) 20 MG/ML concentrated solution Give 0.5-1 ML Q1H prn  . nystatin (MYCOSTATIN/NYSTOP) powder Apply topically 3 (three) times daily.  Marland Kitchen omeprazole (PRILOSEC) 40 MG capsule TAKE 1 CAPSULE BY MOUTH EVERY DAY  . ondansetron (ZOFRAN) 4 MG tablet Take 1 tablet (4 mg total) by mouth as needed.  . ONE TOUCH ULTRA TEST test strip 1 EACH BY OTHER ROUTE DAILY AS NEEDED FOR OTHER.  . QUEtiapine (SEROQUEL) 50 MG tablet Take 1 tablet (50 mg total) by mouth at bedtime.   No  facility-administered encounter medications on file as of 01/18/2020.     Darcia Lampi Jenetta Downer, NP

## 2020-01-18 NOTE — Telephone Encounter (Signed)
Caregiver called stating that EMS is there now and needs to speak to someone. Melissa paramedic stated that she is there now and patient is at the end of life stage. Melissa stated that there is not a DNR there and patient has advised that she does not want to be resuscitated. Melissa stated that she wants to keep the patient comfortable and plans on giving her a nebulizer treatment. Melissa stated that patient has stage 4 COPD. Melissa stated that patient is refusing to go to the hospital. Information given to [name] NP and she is going to call Melissa back to discuss the situation.

## 2020-01-19 ENCOUNTER — Telehealth: Payer: Self-pay | Admitting: Pulmonary Disease

## 2020-01-19 NOTE — Telephone Encounter (Signed)
Brought in new machine, [facility] turning Oxygen up to 5L and it is burning there nose, she would like to know what is the suggested liter flow for oxygen since Dr. Reece Agar told patient not to go above 3L  Dr. Reece Agar please advise,

## 2020-01-20 ENCOUNTER — Telehealth: Payer: Self-pay

## 2020-01-20 NOTE — Telephone Encounter (Signed)
D/W Sheralyn Boatman. Questions answered.

## 2020-01-20 NOTE — Telephone Encounter (Signed)
ATC patient was leaving message and it cut me off mid message stating memory full.   Will leave in [facility] triage for further follow up today 01/20/20

## 2020-01-20 NOTE — Telephone Encounter (Signed)
Sheralyn Boatman nurse with [facility] has appt to see pt 01/20/20 at 11 AM; Sheralyn Boatman said on 01/18/20 the admission nurse advised Sheralyn Boatman that Pamala Hurry  NP was present and Sheralyn Boatman was told that Pamala Hurry NP said someone in the home might take the pts lorazepam. There has also been a change in status; pt was noted on 01/18/20 that pt was unresponsive and actively dying. Sheralyn Boatman understands today the pt is responsive. Sheralyn Boatman request cb from Pamala Hurry NP to know what to do about Lorazepam and Xanax and is there drug diversion. Sheralyn Boatman is aware Pamala Hurry NP will be in office this afternoon.

## 2020-01-20 NOTE — Telephone Encounter (Signed)
When we saw her in the office she did not need more than 3 L/min.  She was seen very recently.  Recommend to get an oximeter and keep her oxygen saturation between 88 to 92% and no more.  Not think she needs 6 L of oxygen considering that she is also a retainer.

## 2020-01-23 NOTE — Telephone Encounter (Signed)
I called and spoke with the pt and notified her of the below response per Dr Jayme Cloud and she verbalized understanding. Nothing further needed.

## 2020-01-24 ENCOUNTER — Telehealth: Payer: Self-pay | Admitting: Internal Medicine

## 2020-01-24 ENCOUNTER — Telehealth: Payer: Self-pay

## 2020-01-24 NOTE — Telephone Encounter (Signed)
Dixie pts caregiver called (DPR not signed) pt took the phone and pt wants to know why hospice decreased her alprazolam 1 mg to 1/2 tab tid; pt wants to go back and take alprazolam 1 mg like previously instructed which was 1 tab po tid. Pt is also taking Lorazepam 0.5 mg at bedtime. Pt said that Hospice is filling her pill box. Pt request cb from [name] NP on 01/25/20.

## 2020-01-24 NOTE — Telephone Encounter (Signed)
I would like her to collect a urinalysis and culture and notify me of the results. Push fluids.

## 2020-01-24 NOTE — Telephone Encounter (Signed)
VO given and Tami Skinner stated she will have someone to go to the home to collect urine sample

## 2020-01-24 NOTE — Telephone Encounter (Signed)
Sheralyn Boatman with [facility] called today. She stated that the patient is having burning with urinating, frequent urination and very little out out,. She wanted to know if you wanted to order the patient an antibiotic. She stated to to her knowledge the patient has frequent UTI's    [facility] Call Back # [phone]

## 2020-01-25 NOTE — Telephone Encounter (Signed)
Tami Skinner is aware and will talk back with the home nurse and husband to get through to the pt and see if she can understand

## 2020-01-25 NOTE — Telephone Encounter (Signed)
It was decreased because the nurse said she's been lethargic.

## 2020-01-27 ENCOUNTER — Telehealth: Payer: Self-pay | Admitting: Internal Medicine

## 2020-01-27 NOTE — Telephone Encounter (Signed)
Left detailed msg on VM per HIPAA  

## 2020-01-27 NOTE — Telephone Encounter (Signed)
Retta Diones, called.  Sheralyn Boatman received the lab report back for patient.  Patient does have a UTI and they need a rx for Cipro.  Sheralyn Boatman said she can do a verbal order. Sheralyn Boatman said if she doesn't answer,  a detailed message can be left on her voicemail.

## 2020-01-27 NOTE — Telephone Encounter (Signed)
I would appreciate it if Sheralyn Boatman could fax over the results so that I can review them myself. Urinalysis and culture please.

## 2020-01-27 NOTE — Telephone Encounter (Signed)
Lab results reviewed 25000 colonies- proteus. Will hold of on abx for now.

## 2020-01-30 ENCOUNTER — Other Ambulatory Visit: Payer: Self-pay | Admitting: Psychiatry

## 2020-01-30 ENCOUNTER — Encounter: Payer: Self-pay | Admitting: Internal Medicine

## 2020-01-30 DIAGNOSIS — F330 Major depressive disorder, recurrent, mild: Secondary | ICD-10-CM

## 2020-01-30 DIAGNOSIS — F411 Generalized anxiety disorder: Secondary | ICD-10-CM

## 2020-01-31 ENCOUNTER — Ambulatory Visit: Payer: Medicare PPO | Admitting: Internal Medicine

## 2020-01-31 ENCOUNTER — Ambulatory Visit: Payer: Medicare PPO | Admitting: Pulmonary Disease

## 2020-02-01 ENCOUNTER — Ambulatory Visit: Payer: Medicare PPO | Admitting: Primary Care

## 2020-02-02 ENCOUNTER — Telehealth: Payer: Self-pay | Admitting: Internal Medicine

## 2020-02-28 ENCOUNTER — Ambulatory Visit: Payer: Medicare PPO | Admitting: Pulmonary Disease

## 2020-03-03 NOTE — Telephone Encounter (Signed)
Tami Skinner with [facility] called today She stated the patient passed away today @ 12:28pm

## 2020-03-03 NOTE — Telephone Encounter (Signed)
Noted  

## 2020-03-03 DEATH — deceased
